# Patient Record
Sex: Male | Born: 1940
Health system: Southern US, Community
[De-identification: ages and names within clinical notes are randomized; demographics above are authoritative.]

## PROBLEM LIST (undated history)

## (undated) DIAGNOSIS — I10 Essential (primary) hypertension: Secondary | ICD-10-CM

## (undated) DIAGNOSIS — G4733 Obstructive sleep apnea (adult) (pediatric): Secondary | ICD-10-CM

## (undated) DIAGNOSIS — I714 Abdominal aortic aneurysm, without rupture, unspecified: Secondary | ICD-10-CM

## (undated) DIAGNOSIS — Z87442 Personal history of urinary calculi: Secondary | ICD-10-CM

## (undated) DIAGNOSIS — H9319 Tinnitus, unspecified ear: Secondary | ICD-10-CM

## (undated) DIAGNOSIS — K449 Diaphragmatic hernia without obstruction or gangrene: Secondary | ICD-10-CM

## (undated) DIAGNOSIS — N200 Calculus of kidney: Secondary | ICD-10-CM

## (undated) DIAGNOSIS — E785 Hyperlipidemia, unspecified: Secondary | ICD-10-CM

## (undated) DIAGNOSIS — K635 Polyp of colon: Secondary | ICD-10-CM

## (undated) DIAGNOSIS — K219 Gastro-esophageal reflux disease without esophagitis: Secondary | ICD-10-CM

## (undated) DIAGNOSIS — I2510 Atherosclerotic heart disease of native coronary artery without angina pectoris: Secondary | ICD-10-CM

## (undated) DIAGNOSIS — M1990 Unspecified osteoarthritis, unspecified site: Secondary | ICD-10-CM

## (undated) DIAGNOSIS — N400 Enlarged prostate without lower urinary tract symptoms: Secondary | ICD-10-CM

## (undated) DIAGNOSIS — R011 Cardiac murmur, unspecified: Secondary | ICD-10-CM

## (undated) DIAGNOSIS — IMO0002 Reserved for concepts with insufficient information to code with codable children: Secondary | ICD-10-CM

## (undated) HISTORY — DX: Atherosclerotic heart disease of native coronary artery without angina pectoris: I25.10

## (undated) HISTORY — DX: Reserved for concepts with insufficient information to code with codable children: IMO0002

## (undated) HISTORY — DX: Diaphragmatic hernia without obstruction or gangrene: K44.9

## (undated) HISTORY — DX: Hyperlipidemia, unspecified: E78.5

## (undated) HISTORY — DX: Polyp of colon: K63.5

## (undated) HISTORY — PX: UPPER GI ENDOSCOPY: SHX6162

## (undated) HISTORY — DX: Abdominal aortic aneurysm, without rupture, unspecified: I71.40

## (undated) HISTORY — PX: TONSILLECTOMY: SUR1361

## (undated) HISTORY — DX: Calculus of kidney: N20.0

## (undated) HISTORY — PX: COLONOSCOPY: SHX174

## (undated) HISTORY — DX: Essential (primary) hypertension: I10

## (undated) HISTORY — DX: Abdominal aortic aneurysm, without rupture: I71.4

---

## 1997-11-09 ENCOUNTER — Ambulatory Visit (HOSPITAL_COMMUNITY): Admission: RE | Admit: 1997-11-09 | Discharge: 1997-11-09 | Payer: Self-pay | Admitting: *Deleted

## 2000-12-17 ENCOUNTER — Ambulatory Visit (HOSPITAL_COMMUNITY): Admission: RE | Admit: 2000-12-17 | Discharge: 2000-12-17 | Payer: Self-pay | Admitting: Gastroenterology

## 2000-12-17 ENCOUNTER — Encounter (INDEPENDENT_AMBULATORY_CARE_PROVIDER_SITE_OTHER): Payer: Self-pay | Admitting: *Deleted

## 2001-03-04 HISTORY — PX: OTHER SURGICAL HISTORY: SHX169

## 2003-02-16 ENCOUNTER — Ambulatory Visit (HOSPITAL_COMMUNITY): Admission: RE | Admit: 2003-02-16 | Discharge: 2003-02-16 | Payer: Self-pay | Admitting: Family Medicine

## 2004-03-04 HISTORY — PX: CORONARY ARTERY BYPASS GRAFT: SHX141

## 2004-07-05 ENCOUNTER — Ambulatory Visit: Payer: Self-pay | Admitting: Cardiology

## 2004-07-06 ENCOUNTER — Inpatient Hospital Stay (HOSPITAL_BASED_OUTPATIENT_CLINIC_OR_DEPARTMENT_OTHER): Admission: RE | Admit: 2004-07-06 | Discharge: 2004-07-06 | Payer: Self-pay | Admitting: *Deleted

## 2004-07-06 ENCOUNTER — Inpatient Hospital Stay (HOSPITAL_COMMUNITY): Admission: AD | Admit: 2004-07-06 | Discharge: 2004-07-13 | Payer: Self-pay | Admitting: *Deleted

## 2004-07-06 ENCOUNTER — Ambulatory Visit: Payer: Self-pay | Admitting: *Deleted

## 2004-08-02 ENCOUNTER — Ambulatory Visit: Payer: Self-pay | Admitting: Cardiology

## 2004-09-27 ENCOUNTER — Ambulatory Visit: Payer: Self-pay | Admitting: Cardiology

## 2005-04-10 ENCOUNTER — Ambulatory Visit: Payer: Self-pay | Admitting: Cardiology

## 2005-12-23 ENCOUNTER — Encounter: Admission: RE | Admit: 2005-12-23 | Discharge: 2005-12-23 | Payer: Self-pay | Admitting: Orthopedic Surgery

## 2006-03-04 HISTORY — PX: OTHER SURGICAL HISTORY: SHX169

## 2006-05-01 ENCOUNTER — Ambulatory Visit: Payer: Self-pay | Admitting: Cardiology

## 2006-05-07 ENCOUNTER — Ambulatory Visit (HOSPITAL_BASED_OUTPATIENT_CLINIC_OR_DEPARTMENT_OTHER): Admission: RE | Admit: 2006-05-07 | Discharge: 2006-05-07 | Payer: Self-pay | Admitting: Orthopedic Surgery

## 2007-04-22 ENCOUNTER — Ambulatory Visit: Payer: Self-pay | Admitting: Cardiology

## 2008-03-31 ENCOUNTER — Encounter: Admission: RE | Admit: 2008-03-31 | Discharge: 2008-03-31 | Payer: Self-pay | Admitting: Gastroenterology

## 2008-04-06 ENCOUNTER — Ambulatory Visit: Payer: Self-pay | Admitting: Cardiology

## 2008-12-15 ENCOUNTER — Encounter: Payer: Self-pay | Admitting: Cardiology

## 2009-01-18 ENCOUNTER — Encounter (INDEPENDENT_AMBULATORY_CARE_PROVIDER_SITE_OTHER): Payer: Self-pay | Admitting: *Deleted

## 2009-04-14 DIAGNOSIS — I10 Essential (primary) hypertension: Secondary | ICD-10-CM | POA: Insufficient documentation

## 2009-04-14 DIAGNOSIS — E785 Hyperlipidemia, unspecified: Secondary | ICD-10-CM | POA: Insufficient documentation

## 2009-04-14 DIAGNOSIS — I2510 Atherosclerotic heart disease of native coronary artery without angina pectoris: Secondary | ICD-10-CM

## 2009-04-14 DIAGNOSIS — K449 Diaphragmatic hernia without obstruction or gangrene: Secondary | ICD-10-CM | POA: Insufficient documentation

## 2009-04-14 DIAGNOSIS — D126 Benign neoplasm of colon, unspecified: Secondary | ICD-10-CM

## 2009-04-14 DIAGNOSIS — IMO0002 Reserved for concepts with insufficient information to code with codable children: Secondary | ICD-10-CM

## 2009-04-19 ENCOUNTER — Encounter: Payer: Self-pay | Admitting: Cardiology

## 2009-04-19 ENCOUNTER — Ambulatory Visit: Payer: Self-pay | Admitting: Cardiology

## 2009-07-11 ENCOUNTER — Ambulatory Visit: Payer: Self-pay | Admitting: Cardiology

## 2009-07-11 ENCOUNTER — Ambulatory Visit: Payer: Self-pay

## 2009-08-22 ENCOUNTER — Encounter: Payer: Self-pay | Admitting: Cardiology

## 2009-08-30 ENCOUNTER — Encounter: Payer: Self-pay | Admitting: Cardiology

## 2009-09-01 ENCOUNTER — Telehealth: Payer: Self-pay | Admitting: Cardiology

## 2009-09-01 DIAGNOSIS — I493 Ventricular premature depolarization: Secondary | ICD-10-CM | POA: Insufficient documentation

## 2009-09-13 ENCOUNTER — Encounter: Payer: Self-pay | Admitting: Cardiology

## 2009-09-14 ENCOUNTER — Ambulatory Visit (HOSPITAL_COMMUNITY): Admission: RE | Admit: 2009-09-14 | Discharge: 2009-09-14 | Payer: Self-pay | Admitting: Cardiology

## 2009-09-14 ENCOUNTER — Ambulatory Visit: Payer: Self-pay

## 2009-09-14 ENCOUNTER — Encounter: Payer: Self-pay | Admitting: Cardiology

## 2009-09-14 ENCOUNTER — Telehealth (INDEPENDENT_AMBULATORY_CARE_PROVIDER_SITE_OTHER): Payer: Self-pay | Admitting: *Deleted

## 2009-09-14 ENCOUNTER — Ambulatory Visit: Payer: Self-pay | Admitting: Cardiology

## 2009-09-27 ENCOUNTER — Encounter: Payer: Self-pay | Admitting: Cardiology

## 2009-10-11 ENCOUNTER — Ambulatory Visit: Payer: Self-pay | Admitting: Cardiology

## 2009-10-17 ENCOUNTER — Encounter: Payer: Self-pay | Admitting: Cardiology

## 2010-03-27 ENCOUNTER — Telehealth (INDEPENDENT_AMBULATORY_CARE_PROVIDER_SITE_OTHER): Payer: Self-pay | Admitting: *Deleted

## 2010-04-03 NOTE — Assessment & Plan Note (Signed)
Summary: [name] [name]  Medications Added NIASPAN 1000 MG CR-TABS (NIACIN (ANTIHYPERLIPIDEMIC)) 1 by mouth daily ASPIRIN 81 MG  TABS (ASPIRIN) 1-2 by mouth daily LIPITOR 20 MG TABS (ATORVASTATIN CALCIUM) 1 by mouth daily MULTIVITAMINS   TABS (MULTIPLE VITAMIN) 1 by mouth daily COQ10 100 MG CAPS (COENZYME Q10) 1 by mouth daily VITAMIN A & D 5000-400 UNIT CAPS (VITAMINS A & D) 1 by mouth daily * OSTEOBIFLEX 1 by mouth daily LOVAZA 1 GM CAPS (OMEGA-3-ACID ETHYL ESTERS) 3 daily      Allergies Added: ! LEVAQUIN  Visit Type:  Follow-up Primary Provider:  Dr. Vernon Prey  CC:  CAD.  History of Present Illness: The patient returns for yearly followup.  Since I last saw him he has done well. He denies any chest pressure, neck or arm discomfort. He has had no palpitations, presyncope or syncope. He denies any shortness of breath, PND or orthopnea. He does walk for exercise and also walks for golfing routinely without bringing on any of his previous symptoms.  Current Medications (verified): 1)  Metoprolol Tartrate 25 Mg Tabs (Metoprolol Tartrate) .Marland Kitchen.. 1 By Mouth Two Times A Day 2)  Niaspan 1000 Mg Cr-Tabs (Niacin (Antihyperlipidemic)) .Marland Kitchen.. 1 By Mouth Daily 3)  Aspirin 81 Mg  Tabs (Aspirin) .Marland Kitchen.. 1-2 By Mouth Daily 4)  Lipitor 20 Mg Tabs (Atorvastatin Calcium) .Marland Kitchen.. 1 By Mouth Daily 5)  Multivitamins   Tabs (Multiple Vitamin) .Marland Kitchen.. 1 By Mouth Daily 6)  Coq10 100 Mg Caps (Coenzyme Q10) .Marland Kitchen.. 1 By Mouth Daily 7)  Vitamin A & D 5000-400 Unit Caps (Vitamins A & D) .Marland Kitchen.. 1 By Mouth Daily 8)  Osteobiflex .Marland KitchenMarland Kitchen. 1 By Mouth Daily  Allergies (verified): 1)  ! Levaquin  Past History:  Past Medical History: Reviewed history from 04/14/2009 and no changes required. 1. Coronary artery disease status post CABG in 2006 (LIMA to the LAD,       SVG to the right coronary artery, SVG to circumflex, and SVG to       diagonal).   2. Dyslipidemia.   3. Colonic polyps.   4. Hiatal hernia.   5. Herniated  disk.   6. Hypertension.   7. Mole removed, 2003.   8. Right knee surgery, 2008.   9. Tonsillectomy.   Past Surgical History: Had a mole removed in 2003 and had a tonsillectomy as a child. CABG  Review of Systems       As stated in the HPI and negative for all other systems.   Vital Signs:  Patient profile:   70 year old male Height:      73 inches Weight:      205 pounds BMI:     27.14 Pulse rate:   57 / minute Resp:     16 per minute BP sitting:   144 / 72  (right arm)  Vitals Entered By: Marrion Coy, CNA (April 19, 2009 11:25 AM)  Physical Exam  General:  Well developed, well nourished, in no acute distress. Head:  normocephalic and atraumatic Eyes:  PERRLA/EOM intact; conjunctiva and lids normal. Mouth:  Teeth, gums and palate normal. Oral mucosa normal. Neck:  Neck supple, no JVD. No masses, thyromegaly or abnormal cervical nodes. Chest Wall:  well-healed sternotomy scar Lungs:  Clear bilaterally to auscultation and percussion. Heart:  Non-displaced PMI, chest non-tender; regular rate and rhythm, S1, S2 without murmurs, rubs or gallops. Carotid upstroke normal, no bruit. Normal abdominal aortic size, no bruits. Femorals normal pulses, no bruits. Pedals normal  pulses. No edema, no varicosities. Abdomen:  Bowel sounds positive; abdomen soft and non-tender without masses, organomegaly, or hernias noted. No hepatosplenomegaly. Msk:  Back normal, normal gait. Muscle strength and tone normal. Extremities:  No clubbing or cyanosis. Neurologic:  Alert and oriented x 3. Skin:  Intact without lesions or rashes. Cervical Nodes:  no significant adenopathy Axillary Nodes:  no significant adenopathy Inguinal Nodes:  no significant adenopathy Psych:  Normal affect.   EKG  Procedure date:  04/19/2009  Findings:      sinus rhythm, rate 57, axis within normal limits, intervals within normal limits, no acute ST-T wave changes.  Impression & Recommendations:  Problem #  1:  CAD (ICD-414.00) He is doing well. However, he is now 5 years from the time of his bypass. I will bring him back for an exercise treadmill test to followup on this. He will otherwise continue with risk reduction. Orders: Treadmill (Treadmill) EKG w/ Interpretation (93000)  Problem # 2:  HYPERTENSION (ICD-401.9) His blood pressure is slightly elevated but this is unusual. He keeps an eye on this at home. No change in therapy is indicated.  Problem # 3:  DYSLIPIDEMIA (ICD-272.4) I did review the lipids drawn by Dr. Christell Constant. His LDL was 81 with an HDL of 52. However, to further improve his particle size it was suggested that he try a higher dose of Niaspan. He could not tolerate 1500 mg. He developed some muscle aches and weakness. He is back down to 1000 mg. I did encourage him to try one more time the higher dose and if he gets symptoms with this to further discuss it with Dr. Christell Constant.  Patient Instructions: 1)  Your physician recommends that you schedule a follow-up appointment in: 12 months with Dr Antoine Poche [place] 2)  Your physician recommends that you continue on your current medications as directed. Please refer to the Current Medication list given to you today. 3)  Your physician has requested that you have an exercise tolerance test.  For further information please visit [email or web address]  Please also follow instruction sheet, as given.  Appt for 05/23/2009 at 3 pm in the [place] office on the 3rd floor.  Nothing to eat/drink and no tobacco products 3 hours beore the test.  Please call [phone] to reschedule if needed.

## 2010-04-03 NOTE — Progress Notes (Signed)
Summary: order for 2 d echo and f/u with [facility]   ---- Converted from flag ---- ---- 09/01/2009 10:23 AM, Rollene Rotunda, MD, [facility] wrote: Can you schedule an echo for this patient.  He is having frequent PVCs.  He then needs follow up with me.  Thanks. ------------------------------     New Problems: PREMATURE VENTRICULAR CONTRACTIONS (ICD-427.69)   New Problems: PREMATURE VENTRICULAR CONTRACTIONS (ICD-427.69)

## 2010-04-03 NOTE — Miscellaneous (Signed)
  Clinical Lists Changes  Observations: Added new observation of ECHOINTERP:  - Left ventricle: The cavity size was normal. Wall thickness was       increased in a pattern of mild LVH. Systolic function was normal.       The estimated ejection fraction was in the range of 55% to 60%.       Wall motion was normal; there were no regional wall motion       abnormalities. Features are consistent with a pseudonormal left       ventricular filling pattern, with concomitant abnormal relaxation       and increased filling pressure (grade 2 diastolic dysfunction).     - Aortic valve: Mild regurgitation.     - Mitral valve: Mild regurgitation.     - Left atrium: The atrium was mildly dilated.     - Right atrium: The atrium was mildly dilated.     - Pulmonary arteries: Systolic pressure was mildly increased. (09/14/2009 10:48) Added new observation of ETTFINDING: Quality of ETT:   diagnostic    ETT Interpretation:   normal-no evidence of ischemia by ST analysis    Comments:     The patient had an excellent exercise tolerance.  He had no chest pain and appropriate SOB.  He had an appropriate BP response.  There were PVCs.  He had no ischemic ST T wave changes.      Recommendations:   Negative adequate ETT.  No evidence of new clinically significant CAD.  No further testing is suggested.  He can continue with secondary risk reduction.  (07/11/2009 10:49)      Echocardiogram  Procedure date:  09/14/2009  Findings:       - Left ventricle: The cavity size was normal. Wall thickness was       increased in a pattern of mild LVH. Systolic function was normal.       The estimated ejection fraction was in the range of 55% to 60%.       Wall motion was normal; there were no regional wall motion       abnormalities. Features are consistent with a pseudonormal left       ventricular filling pattern, with concomitant abnormal relaxation       and increased filling pressure (grade 2 diastolic  dysfunction).     - Aortic valve: Mild regurgitation.     - Mitral valve: Mild regurgitation.     - Left atrium: The atrium was mildly dilated.     - Right atrium: The atrium was mildly dilated.     - Pulmonary arteries: Systolic pressure was mildly increased.  Exercise Stress Test  Procedure date:  07/11/2009  Findings:      Quality of ETT:   diagnostic    ETT Interpretation:   normal-no evidence of ischemia by ST analysis    Comments:     The patient had an excellent exercise tolerance.  He had no chest pain and appropriate SOB.  He had an appropriate BP response.  There were PVCs.  He had no ischemic ST T wave changes.      Recommendations:   Negative adequate ETT.  No evidence of new clinically significant CAD.  No further testing is suggested.  He can continue with secondary risk reduction.

## 2010-04-03 NOTE — Letter (Signed)
Summary: [employer] Authorization Notification   [employer] Authorization Notification   Imported By: Roderic Ovens 11/02/2009 14:15:46  _____________________________________________________________________  External Attachment:    Type:   Image     Comment:   External Document

## 2010-04-03 NOTE — Assessment & Plan Note (Signed)
Summary: [name] [name]  Medications Added LOVAZA 1 GM CAPS (OMEGA-3-ACID ETHYL ESTERS) 2  daily MULTIVITAMINS   TABS (MULTIPLE VITAMIN) 1 by mouth daily      Allergies Added:   Visit Type:  Follow-up Primary Provider:  Dr. Vernon Prey  CC:  CAD.  History of Present Illness: The patient presents for followup of some recent palpitations. He did have some PVCs. An echocardiogram done to evaluate this demonstrated no significant valvular abnormalities and normal left ventricular function. Since that time he stopped drinking decaffeinated coffee or tea and his symptoms seemed to have resolved. He remains active walking and exercising routinely. With this he denies any chest pressure, neck or arm discomfort. He isn't noticing any palpitations and has had no presyncope or syncope. He denies any shortness of breath, PND or orthopnea. He has had no lower extremity edema.  Current Medications (verified): 1)  Metoprolol Tartrate 25 Mg Tabs (Metoprolol Tartrate) .Marland Kitchen.. 1 By Mouth Two Times A Day 2)  Niaspan 1000 Mg Cr-Tabs (Niacin (Antihyperlipidemic)) .Marland Kitchen.. 1 By Mouth Daily 3)  Aspirin 81 Mg  Tabs (Aspirin) .Marland Kitchen.. 1-2 By Mouth Daily 4)  Lipitor 20 Mg Tabs (Atorvastatin Calcium) .Marland Kitchen.. 1 By Mouth Daily 5)  Multivitamins   Tabs (Multiple Vitamin) .Marland Kitchen.. 1 By Mouth Daily 6)  Coq10 100 Mg Caps (Coenzyme Q10) .Marland Kitchen.. 1 By Mouth Daily 7)  Vitamin A & D 5000-400 Unit Caps (Vitamins A & D) .Marland Kitchen.. 1 By Mouth Daily 8)  Lovaza 1 Gm Caps (Omega-3-Acid Ethyl Esters) .... 2  Daily 9)  Multivitamins   Tabs (Multiple Vitamin) .Marland Kitchen.. 1 By Mouth Daily  Allergies (verified): 1)  ! Levaquin  Past History:  Past Medical History: Reviewed history from 04/14/2009 and no changes required. 1. Coronary artery disease status post CABG in 2006 (LIMA to the LAD,       SVG to the right coronary artery, SVG to circumflex, and SVG to       diagonal).   2. Dyslipidemia.   3. Colonic polyps.   4. Hiatal hernia.   5. Herniated disk.    6. Hypertension.   7. Mole removed, 2003.   8. Right knee surgery, 2008.   9. Tonsillectomy.   Past Surgical History: Mole removed in 2003  Tonsillectomy  CABG  Review of Systems       As stated in the HPI and negative for all other systems.   Vital Signs:  Patient profile:   70 year old male Height:      73 inches Weight:      200 pounds BMI:     26.48 Pulse rate:   66 / minute Resp:     18 per minute BP sitting:   126 / 84  (right arm)  Vitals Entered By: [name] Coy, CNA (October 11, 2009 3:21 PM)  Physical Exam  General:  Well developed, well nourished, in no acute distress. Head:  normocephalic and atraumatic Neck:  Neck supple, no JVD. No masses, thyromegaly or abnormal cervical nodes. Chest Wall:  well-healed sternotomy scar Lungs:  Clear bilaterally to auscultation and percussion. Heart:  Non-displaced PMI, chest non-tender; regular rate and rhythm, S1, S2 without murmurs, rubs or gallops. Carotid upstroke normal, no bruit. Normal abdominal aortic size, no bruits. Femorals normal pulses, no bruits. Pedals normal pulses. No edema, no varicosities. Abdomen:  Bowel sounds positive; abdomen soft and non-tender without masses, organomegaly, or hernias noted. No hepatosplenomegaly. Msk:  Back normal, normal gait. Muscle strength and tone normal. Extremities:  No clubbing or cyanosis. Neurologic:  Alert and oriented x 3. Skin:  Intact without lesions or rashes. Cervical Nodes:  no significant adenopathy Axillary Nodes:  no significant adenopathy Inguinal Nodes:  no significant adenopathy Psych:  Normal affect.   Impression & Recommendations:  Problem # 1:  PREMATURE VENTRICULAR CONTRACTIONS (ICD-427.69) The patient is no longer experiencing these. At this point no further cardiovascular testing is suggested. He will continue with the meds as listed.  Problem # 2:  CAD (ICD-414.00) He has no new symptoms. He is anticipating aggressively and risk reduction. No  further cardiovascular testing is suggested.  Problem # 3:  HYPERTENSION (ICD-401.9) The patient's blood pressure is well preserved. He will continue the meds as listed.  Problem # 4:  DYSLIPIDEMIA (ICD-272.4) I reviewed his recent lipid profile and is excellent.  Patient Instructions: 1)  Your physician recommends that you schedule a follow-up appointment in: 6 months with Dr  Lions 2)  Your physician recommends that you continue on your current medications as directed. Please refer to the Current Medication list given to you today.

## 2010-04-03 NOTE — Progress Notes (Signed)
   Walk in Patient Form Recieved " Pt Dropped off EKG from Dr.Moore's "Sent to Message Nurse [facility]  September 14, 2009 4:34 PM

## 2010-04-05 NOTE — Progress Notes (Signed)
Summary: Records Request   Faxed OV & EKG to [facility] [name] at [facility] ([phone]). Debby Freiberg  March 27, 2010 5:06 PM

## 2010-05-01 ENCOUNTER — Other Ambulatory Visit: Payer: Self-pay | Admitting: Gastroenterology

## 2010-05-02 ENCOUNTER — Ambulatory Visit (INDEPENDENT_AMBULATORY_CARE_PROVIDER_SITE_OTHER): Payer: Medicare Other | Admitting: Cardiology

## 2010-05-02 ENCOUNTER — Encounter: Payer: Self-pay | Admitting: Cardiology

## 2010-05-02 DIAGNOSIS — I2510 Atherosclerotic heart disease of native coronary artery without angina pectoris: Secondary | ICD-10-CM

## 2010-05-10 NOTE — Assessment & Plan Note (Signed)
Summary: 70yr rov 414.01  Medications Added COQ10 100 MG CAPS (COENZYME Q10) 1 by mouth two times a day FISH OIL   OIL (FISH OIL) 2 by mouth daily      Allergies Added:   Visit Type:  Follow-up Primary Provider:  Dr. Vernon Prey  CC:  CAD.  History of Present Illness: The patient presents for followup. Since I last saw him he has had no new cardiovascular complaints. He has been exercising routinely. With this level of activity he denied any chest pressure, neck or arm discomfort. He has no palpitations, presyncope or syncope. No weight gain or edema. We did an echocardiogram and stress test in 2011 both of which were normal.  Current Medications (verified): 1)  Metoprolol Tartrate 25 Mg Tabs (Metoprolol Tartrate) .Marland Kitchen.. 1 By Mouth Two Times A Day 2)  Niaspan 1000 Mg Cr-Tabs (Niacin (Antihyperlipidemic)) .Marland Kitchen.. 1 By Mouth Daily 3)  Aspirin 81 Mg  Tabs (Aspirin) .Marland Kitchen.. 1-2 By Mouth Daily 4)  Lipitor 20 Mg Tabs (Atorvastatin Calcium) .Marland Kitchen.. 1 By Mouth Daily 5)  Multivitamins   Tabs (Multiple Vitamin) .Marland Kitchen.. 1 By Mouth Daily 6)  Coq10 100 Mg Caps (Coenzyme Q10) .Marland Kitchen.. 1 By Mouth Two Times A Day 7)  Vitamin A & D 5000-400 Unit Caps (Vitamins A & D) .Marland Kitchen.. 1 By Mouth Daily 8)  Fish Oil   Oil (Fish Oil) .... 2 By Mouth Daily 9)  Multivitamins   Tabs (Multiple Vitamin) .Marland Kitchen.. 1 By Mouth Daily  Allergies (verified): 1)  ! Levaquin  Past History:  Past Medical History: Reviewed history from 04/14/2009 and no changes required. 1. Coronary artery disease status post CABG in 2006 (LIMA to the LAD,       SVG to the right coronary artery, SVG to circumflex, and SVG to       diagonal).   2. Dyslipidemia.   3. Colonic polyps.   4. Hiatal hernia.   5. Herniated disk.   6. Hypertension.   7. Mole removed, 2003.   8. Right knee surgery, 2008.   9. Tonsillectomy.   Past Surgical History: Reviewed history from 10/11/2009 and no changes required. Mole removed in 2003  Tonsillectomy  CABG  Review of  Systems       As stated in the HPI and negative for all other systems.   Vital Signs:  Patient profile:   70 year old male Height:      73 inches Weight:      206 pounds BMI:     27.28 Pulse rate:   65 / minute Resp:     16 per minute BP sitting:   112 / 78  (right arm)  Vitals Entered By: Marrion Coy, CNA (May 02, 2010 9:33 AM)  Physical Exam  General:  Well developed, well nourished, in no acute distress. Head:  normocephalic and atraumatic Neck:  Neck supple, no JVD. No masses, thyromegaly or abnormal cervical nodes. Chest Wall:  well-healed sternotomy scar Lungs:  Clear bilaterally to auscultation and percussion. Heart:  Non-displaced PMI, chest non-tender; regular rate and rhythm, S1, S2 without murmurs, rubs or gallops. Carotid upstroke normal, no bruit. Normal abdominal aortic size, no bruits. Femorals normal pulses, no bruits. Pedals normal pulses. No edema, no varicosities. Abdomen:  Bowel sounds positive; abdomen soft and non-tender without masses, organomegaly, or hernias noted. No hepatosplenomegaly. Genitalia:  normal male, testes descended bilaterally without masses, no hernias noted Msk:  Back normal, normal gait. Muscle strength and tone normal. Extremities:  No clubbing  or cyanosis. Neurologic:  Alert and oriented x 3. Skin:  Intact without lesions or rashes. Cervical Nodes:  no significant adenopathy Inguinal Nodes:  no significant adenopathy Psych:  Normal affect.   EKG  Procedure date:  05/02/2010  Findings:      Sinus rhythm rate 64, axis within normal limits, intervals within normal limits, no acute ST-T wave changes  Impression & Recommendations:  Problem # 1:  CAD (ICD-414.00) The patient has had no new symptoms. No further cardiac testing is suggested. He will continue with risk reduction. Orders: EKG w/ Interpretation (93000)  Problem # 2:  HYPERTENSION (ICD-401.9) His blood pressure is well controlled. Will continue on meds as  listed.  Problem # 3:  DYSLIPIDEMIA (ICD-272.4) I reviewed the profile of January. His LDL was 70, HDL 59 triglycerides 52. This is well within target. He will continue meds as listed.  Problem # 4:  PREMATURE VENTRICULAR CONTRACTIONS (ICD-427.69) He is having symptomatic palpitations. No change in therapy is indicated. Orders: EKG w/ Interpretation (93000)  Patient Instructions: 1)  Your physician recommends that you schedule a follow-up appointment in: 1 yr with Dr Antoine Poche 2)  Your physician recommends that you continue on your current medications as directed. Please refer to the Current Medication list given to you today. Prescriptions: METOPROLOL TARTRATE 25 MG TABS (METOPROLOL TARTRATE) 1 by mouth two times a day  #60 x 11   Entered by:   Charolotte Capuchin, RN   Authorized by:   Rollene Rotunda, MD, [facility]   Signed by:   Charolotte Capuchin, RN on 05/02/2010   Method used:   Faxed to ...       [facility]       [address]       [place], [place]  [address]       Ph: [phone] or [phone]       Fax: [phone]   RxID:   [id number]

## 2010-07-17 NOTE — Assessment & Plan Note (Signed)
[facility]                            CARDIOLOGY OFFICE NOTE   [name] [name], [name] [name]                         MRN:          [id number]  DATE:04/06/2008                            DOB:          [birth year]    PRIMARY CARE PHYSICIAN:  Ernestina Penna, MD   REASON FOR PRESENTATION:  Evaluate the patient with coronary disease.   HISTORY OF PRESENT ILLNESS:  The patient returns for followup.  He is  now 70 years old.  He has done quite well since I last saw him.  He  exercises every day.  He does walking.  He works at [employer]  He walks  [place].  With all of these, he denies any chest pressure, neck  or arm discomfort.  He has had no shortness of breath, PND, or  orthopnea.  He denies any palpitations, presyncope, or syncope.  He has  had an excellent lipid profile.  His blood pressure have been extremely  well controlled.  He has lost about 20 pounds since I last saw him!   PAST MEDICAL HISTORY:  1. Coronary artery disease status post CABG in 2006 (LIMA to the LAD,      SVG to the right coronary artery, SVG to circumflex, and SVG to      diagonal).  2. Dyslipidemia.  3. Colonic polyps.  4. Hiatal hernia.  5. Herniated disk.  6. Hypertension.  7. Mole removed, 2003.  8. Right knee surgery, 2008.  9. Tonsillectomy.   ALLERGIES:  None.   MEDICATIONS:  1. Nexium 40 mg every other day.  2. Multivitamin.  3. Vitamin B complex.  4. Coenzyme Q.  5. Aspirin 81 mg daily.  6. Metoprolol 25 mg b.i.d.  7. Fish oil.  8. Flax seed.  9. Niaspan 1000 mg daily.  10.Lipitor 20 mg daily.  11.Osteo Bi-Flex.  12.Vitamin D.   REVIEW OF SYSTEMS:  As stated in the HPI and otherwise negative for  other systems.   PHYSICAL EXAMINATION:  GENERAL:  The patient is pleasant and in no  distress.  VITAL SIGNS:  Blood pressure 126/86 and heart rate 69 and regular.  HEENT:  Eyes unremarkable, pupils equal, round, and reactive to light,  fundi not visualized,  oral mucosa unremarkable.  NECK:  No jugular venous distention at 45 degrees, carotid upstroke  brisk and symmetric, no bruits, no thyromegaly.  LYMPHATICS:  No adenopathy.  LUNGS:  Clear to auscultation bilaterally.  BACK:  No costovertebral angle tenderness.  CHEST:  Unremarkable.  HEART:  PMI not displaced or sustained, S1 and S2 within normal limits,  no S3, no S4, no clicks, no rubs, no murmurs.  ABDOMEN:  Flat, positive bowel sounds, normal in frequency and pitch, no  bruits, no rebound, no guarding, no midline pulsatile mass, no  hepatomegaly, no splenomegaly.  SKIN:  No rashes, no nodules.  EXTREMITIES:  Pulses 2+, no edema.   EKG; sinus rhythm, rate 69, axis within normal limits, intervals within  normal limits, no acute ST wave changes.   ASSESSMENT AND PLAN:  1. Coronary disease.  The patient is having no new symptoms.  He is      participating very aggressively in secondary risk reduction.  No      further cardiovascular testing is suggested at this point.  I      probably will perform a stress test at the 5-year anniversary of      his bypass which will be next year.  2. Dyslipidemia.  Excellent lipid profile and I reviewed this.  He      will remain on the meds as listed.  3. Followup.  I will see the patient again in 12 months or sooner if      needed.     Rollene Rotunda, MD, [facility]  Electronically Signed    JH/MedQ  DD: 04/06/2008  DT: 04/07/2008  Job #: [id number]   cc:   Ernestina Penna, M.D.

## 2010-07-17 NOTE — Assessment & Plan Note (Signed)
[facility]                            CARDIOLOGY OFFICE NOTE   [name] [name], [name] [name]                         MRN:          [id number]  DATE:04/22/2007                            DOB:          [birth year]    PRIMARY CARE PHYSICIAN:  Ernestina Penna, M.D.   REASON FOR PRESENTATION:  Evaluate the patient with coronary disease.   HISTORY OF PRESENT ILLNESS:  The patient is 70 year old white gentleman  with a history of coronary disease described below.  He has done well in  the past year.  He has had no cardiovascular complaints.  He exercises  routinely.  He denies any chest pressure, neck or arm discomfort.  He is  had no palpitation, presyncope or syncope.  No PND or orthopnea.  Since  I last saw him, he has had his right knee replaced, though he still has  some discomfort there.   PAST MEDICAL HISTORY:  1. Coronary artery disease status post CABG in 2006 (LIMA to the LAD,      SVG to right coronary artery, SVG to circumflex, SVG to diagonal).  2. Dyslipidemia.  3. Colonic polyps.  4. Hiatal hernia.  5. Herniated disk.  6. Hypertension.  7. Mole removed 2003.  8. Right knee surgery 2008.  9. Tonsillectomy.   ALLERGIES:  None.   MEDICATIONS:  1. Multivitamin.  2. B complex.  3. Coenzyme Q.  4. Aspirin 81 mg daily.  5. Toprol 25 mg b.i.d.  6. Fish oil.  7. Flaxseed oil.  8. Niaspan 500 mg daily.  9. Lipitor 40 mg daily.   REVIEW OF SYSTEMS:  As stated in the HPI, otherwise, negative for other  systems.   PHYSICAL EXAMINATION:  GENERAL:  The patient is in no distress.  VITAL SIGNS:  Blood pressure 151/86, heart rate 57 and regular, weight  223 pounds, body mass index 29.  HEENT:  Eyes unremarkable, pupils equal and reactive.  Fundi not  visualized.  NECK:  No jugular distention at 45 degrees, carotid upstroke brisk and  symmetric, no bruits, no thyromegaly.  LYMPHATICS.  No adenopathy.  LUNGS:  Clear to auscultation bilaterally.  CHEST:  Well-healed sternotomy scar.  HEART:  PMI not displaced or sustained, S1-S2 within normal.  No S3-S4,  no clicks, rubs, murmurs.  ABDOMEN:  Flat, positive bowel sounds normal frequency, pitch, no  bruits, rebound, guarding or midline pulsatile mass, organomegaly.  SKIN:  No rash.  EXTREMITIES:  Pulses 2+, no cyanosis or clubbing, edema.  NEUROLOGICAL:  Grossly intact.   STUDIES:  EKG sinus rhythm, rate 57, axis within normal limits,  intervals within normal limits, no acute ST-T wave changes.   ASSESSMENT/PLAN:  1. Coronary disease.  The patient having no symptoms related to this.      No further cardiovascular testing is suggested.  2. Weight.  We did discuss the fact that his weight is going up.  He      is approaching a BMI that will put him in the obese range.      Hopefully, he can comply with losing this.  3. Dyslipidemia per Dr. Christell Constant.  This is being followed closely with      lipo med profiles.  4. Hypertension.  Blood pressure is slightly elevated today, but it is      not when he takes it otherwise.  It has not been at Dr. Kathi Der.      Therefore, I will make no change to his medications.  Encourage      weight loss.  5. Follow-up.  Will see him back in one year or sooner if needed.     Rollene Rotunda, MD, [facility]  Electronically Signed    JH/MedQ  DD: 04/22/2007  DT: 04/23/2007  Job #: [id number]   cc:   Ernestina Penna, M.D.

## 2010-07-20 NOTE — Discharge Summary (Signed)
NAMEJERMEY, CLOSS NO.:  [id number]   MEDICAL RECORD NO.:  [id number]          PATIENT TYPE:  INP   LOCATION:  2029                         FACILITY:  [facility]   PHYSICIAN:  Kerin Perna, M.D.  DATE OF BIRTH:  Jan 14, 1941   DATE OF ADMISSION:  07/06/2004  DATE OF DISCHARGE:  07/12/2004                                 DISCHARGE SUMMARY   ADMISSION DIAGNOSIS:  Coronary artery disease.   DISCHARGE DIAGNOSIS:  1.  Hyperlipidemia.  2.  Hypertension.  3.  Colonic polyps status post resection.  4.  Hiatal hernia.  5.  Herniated lumbar disc.  6.  Mole removal in 2003.  7.  Tonsillectomy as a child.  8.  Class IV unstable angina with severe two vessel coronary artery disease      status post coronary artery bypass grafting x 4.  9.  Right groin pseudoaneurysm after cardiac catheterization stable after      ultrasound guided compression.   ALLERGIES:  No known drug allergies.   BRIEF HISTORY:  The patient is a 70 year old male who has been followed by  Dr. Antoine Poche as an outpatient for quite sometime secondary to a history of  chest discomfort.  The patient's cardiac history started in January 2005  when he experienced an episode of chest pain and was evaluated with some ST  segment depression.  A perfusion scan was subsequently performed which  showed no evidence of significant ischemia or infarction with a normal  ejection fraction.  The patient was treated medically.  He did well until  approximately two weeks prior to admission when he began to experience  increasing chest pain with exertion.  This was accompanied by shortness of  breath and some radiation to the left lateral chest.  The patient was  evaluated by Dr. Antoine Poche and scheduled for an outpatient cardiac  catheterization which was performed on Jul 06, 2004.  This revealed severe  two vessel coronary artery disease.  The ejection fraction was mildly  reduced with apical anterior hypokinesia.  He was,  therefore, admitted and  scheduled for cardiac surgery consultation.  Dr. Kathlee Nations Trigt of the CVTS  service consulted on the patient on Jul 06, 2004, and it was his opinion that  the patient should proceed with elective coronary artery bypass graft  surgery.   HOSPITAL COURSE:  The patient was admitted status post cardiac  catheterization which revealed two vessel coronary artery disease as  previously stated.  Dr. Donata Clay evaluated the patient and it was his  opinion that he should proceed with elective surgical revascularization.  The patient was maintained on routine hospital care and was in stable  condition prior to surgery with the exception of a right groin  pseudoaneurysm that formed status post cardiac catheterization.  This was  evaluated and resolved with ultrasound guided compression.  The patient has  had no further issues from his pseudoaneurysm.   The patient was taken to the OR on Jul 09, 2004, for coronary artery bypass  grafting x 4.  The left internal mammary artery was grafted to  the LAD,  saphenous vein was grafted to the right coronary artery, saphenous vein was  grafted to the circumflex, and saphenous vein graft was grafted to the  diagonal.  Endoscopic graft harvesting was performed on the left lower  extremity.  The patient tolerated the procedure well and was hemodynamically  stable immediately postoperatively.  The patient was transferred from the OR  to the SICU in stable condition.  The patient was extubated without  complications and woke up from anesthesia neurologically intact.   The patient's postoperative course progressed as expected.  On postoperative  day one, he was afebrile with stable vital signs and maintaining a normal  sinus rhythm.  All invasive lines and chest tubes were discontinued in a  routine manner and all drips were weaned accordingly.  The patient has been  volume overloaded postoperatively and has been diuresed accordingly.   The  patient began cardiac rehab on postoperative day one and tolerated this  quite well and is functioning at an acceptable level at this time.  On  postoperative day three, the patient's only complaint is of mild soreness.  He is afebrile with stable vital signs and continues to maintain a normal  sinus rhythm.  On physical exam, the incisions are clean and dry.  Cardiac  is regular rate and rhythm.  The lungs have a few basilar crackles.  There  is no edema present in the bilateral lower extremities.  The patient did  have a mild acute blood loss anemia which is stable at the time and is being  monitored.  The patient is in, overall, stable condition at this time and as  long as he continues to progress in the current manner, will be ready for  discharge in the next 1-2 days pending morning round re-evaluation.   LABORATORY DATA:  CBC on Jul 11, 2004, hemoglobin 10.4, hematocrit 29.6,  white count 10.4, platelets 113.  BMP on Jul 11, 2004, sodium 137, potassium  3.8, BUN 12, creatinine 0.9, glucose 131.   CONDITION ON DISCHARGE:  Improved.   DISCHARGE MEDICATIONS:  1.  Aspirin 81 mg daily.  2.  Toprol XL 50 mg daily.  3.  Plavix 75 mg daily x 3 days.  4.  Niacin 500 mg daily.  5.  Vytorin 10/40 mg daily.  6.  Lasix 40 mg daily x 7 days.  7.  K-Dur 20 mEq daily x 7 days.  8.  Tylox 1-2 p.o. q.4-6h. p.r.n. pain.   DISCHARGE INSTRUCTIONS:  Activities:  No driving and no lifting more than 10  pounds and the patient is to continue daily breathing and walking exercises.  Diet:  Low salt, low fat.  Wound care:  The patient may shower daily and  clean the incisions with soap and water.  The patient is instructed to call  the CVTS office if wound problems arise.  Follow up appointment with Dr.  Antoine Poche, this appointment will be scheduled for patient approximately two  weeks after discharge.  A chest x-ray will be taken at that time which will be given to the patient to bring with him  to his follow up appointment with  Dr. Donata Clay next on Jul 27, 2004, at 12:30.      AY/MEDQ  D:  07/12/2004  T:  07/12/2004  Job:  [id number]   cc:   Rollene Rotunda, M.D.

## 2010-07-20 NOTE — Op Note (Signed)
NAMEHUTTON, PELLICANE                ACCOUNT NO.:  [id number]   MEDICAL RECORD NO.:  [id number]          PATIENT TYPE:  AMB   LOCATION:  [facility]                         FACILITY:  [facility]   PHYSICIAN:  Ollen Gross, M.D.    DATE OF BIRTH:  [birth year]   DATE OF PROCEDURE:  05/07/2006  DATE OF DISCHARGE:                               OPERATIVE REPORT   PREOPERATIVE DIAGNOSIS:  Right knee medial meniscal tear.   POSTOPERATIVE DIAGNOSIS:  Right knee medial meniscal tear.   PROCEDURE:  Right knee arthroscopy with medial meniscal debridement.   SURGEON:  Ollen Gross, M.D.  No assistant.   ANESTHESIA:  General.   ESTIMATED BLOOD LOSS:  Minimal.   DRAINS:  None.   COMPLICATIONS:  None.   CONDITION:  Stable to recovery.   BRIEF CLINICAL NOTE:  Mr. [name] [name] is a 70 year old male who has some mild  degenerative change, right knee, but had onset of significant pain and  mechanical symptoms medially.  Exam and history suggested a meniscal  tear, confirmed by MRI.  He presents now for arthroscopy and  debridement.   PROCEDURE IN DETAIL:  After successful administration of general  anesthetic, a tourniquet is placed high on the right thigh and right  lower extremity prepped and draped in the usual sterile fashion.  Standard superomedial and inferolateral incisions made, inflow cannula  passed superomedial, camera passed inferolateral.  Arthroscopic  visualization proceeds.  Undersurface of the patella shows some grade 2  changes as the trochlea does also with grade 2 and 3 change but no  exposed bone.  The medial and lateral gutters are visualized and there  are no loose bodies.  Flexion and valgus force is applied to the knee  and the medial compartment is entered.  He does have some grade 3 and 4  change medial femoral condyle, mainly grade 3, small focal areas of  grade 4.  He does have evidence of a degenerative tear in the body and  posterior horn of the medial meniscus.  A spinal  needle is used to  localize the inferomedial portal, a small incision made, dilator placed,  and the meniscus debrided back to a stable base with baskets and a 4.2  mm shaver and sealed off with the ArthroCare device.  There was no  evidence of any loose cartilage on the surface of the medial femoral  condyle, thus we did not need to do a chondroplasty.  The intercondylar  notch is then visualized.  There was some osteophyte but the ACL appears  and probes normally.  The lateral compartment os entered and it looks  normal.  The patellofemoral compartment is again addressed and there is  no unstable cartilage.  The joint is again inspected.  There are no  further cartilaginous tears or loose bodies.  The arthroscopic equipment  is then removed from the inferior portals, which are closed with  interrupted 4-0 nylon.  Marcaine 0.25% with epinephrine 20 mL are injected through the inflow  cannula, and that is removed and that portal closed with nylon.  A bulky  sterile dressing is  applied and he is awakened and transferred to  recovery in stable condition.      Ollen Gross, M.D.  Electronically Signed     FA/MEDQ  D:  05/07/2006  T:  05/07/2006  Job:  [id number]

## 2010-07-20 NOTE — Assessment & Plan Note (Signed)
[facility]                            CARDIOLOGY OFFICE NOTE   [name] [name], [name] [name]                         MRN:          [id number]  DATE:05/01/2006                            DOB:          08/20/1940    PRIMARY CARE PHYSICIAN:  Ernestina Penna, M.D.   REASON FOR PROCEDURE:  Evaluate patient with coronary disease, status  post CABG.  He is preop for an arthroscopic knee surgery.  He is now 70  years old.  He has had no problems since I last saw him other than knee  pain.  He denies any chest discomfort similar to his previous angina.  He has had no neck or arm discomfort.  He has had no palpitations,  presyncope, or syncope.  He has had no PND or orthopnea.  He tries to  still remain active, doing elliptical work and treadmill work, as his  knee allows.   PAST MEDICAL HISTORY:  1. Coronary artery disease, status post CABG in 2006 (LIMA to the LAD,      SVG to the right coronary artery, SVG to circumflex, SVG to      diagonal).  2. Dyslipidemia.  3. Colon polyps.  4. Hiatal hernia.  5. Herniated disk.  6. Hypertension.  7. Mole removed in 2003.  8. Tonsillectomy.   ALLERGIES:  None.   MEDICATIONS:  Multivitamin, garlic, Coenzyme Q, aspirin 81 mg daily,  Toprol 25 mg b.i.d., fish oil, flaxseed oil, Niaspan 500 mg daily,  Lipitor 40 mg daily, Osteo Bi-Flex.   REVIEW OF SYSTEMS:  As stated in the HPI, otherwise negative for other  systems.   PHYSICAL EXAMINATION:  GENERAL:  Patient is in no distress.  VITAL SIGNS:  Blood pressure 144/80, heart rate 52 and regular. Weight  215 pounds.  Body Mass Index 28.  HEENT:  Eyelids unremarkable.  Pupils are equal, round and reactive to  light.  Fundi not visualized.  Oral mucosa unremarkable.  NECK:  No jugular venous distention.  Wave form within normal limits.  Carotid upstroke brisk and symmetric.  No bruits, no thyromegaly.  LYMPHATICS:  No cervical, axillary, or inguinal adenopathy.  LUNGS:   Clear to auscultation bilaterally.  BACK:  No costovertebral angle tenderness.  CHEST:  A well-healed sternotomy scar.  HEART:  PMI not displaced or sustained.  S1 and S2 within normal limits.  No S3, no S4, no clicks, rubs, or murmurs.  ABDOMEN:  Flat, positive bowel sounds.  Normal in frequency and pitch.  No bruits, rebound, guarding.  There are no midline pulsatile masses.  No hepatomegaly, splenomegaly.  SKIN:  No rashes, no nodules.  EXTREMITIES:  Pulses 2+ throughout.  No cyanosis, no clubbing, no edema.  NEUROLOGIC:  Oriented to person, place, and time.  Cranial nerves II-XII  grossly intact.  Motor grossly intact throughout.   EKG:  Sinus bradycardia, rate 52, first-degree AV block, axis within  normal limits, intervals within normal limits, no acute ST-T wave  changes.   ASSESSMENT/PLAN:  1. Coronary disease:  The patient is doing well with respect to this.  He is having no cardiovascular symptoms.  He has been active.  He      is practicing secondary risk reduction.  No further cardiovascular      testing is suggested.  2. Preoperative evaluation:  Based on the above and the low risk      nature of the procedure, he is at acceptable risk for the planned      surgery without further cardiovascular studies.  He should remain      on his medications, in particular his beta blocker.  He certainly      can come off his aspirin as needed.  3. Hypertension:  Blood pressure is very slightly elevated; however,      this is unusual, and he keeps it at home.  He will follow this with      Dr. Christell Constant and remain on the medications as listed.  4. Followup:  I would like to see him back yearly or sooner if needed.     Rollene Rotunda, MD, [facility]  Electronically Signed    JH/MedQ  DD: 05/01/2006  DT: 05/01/2006  Job #: [id number]   cc:   Ernestina Penna, M.D.  Ollen Gross, M.D.

## 2010-07-20 NOTE — Procedures (Signed)
[place]  Patient:    [name], [name] Visit Number: [id number] MRN: [id number]          Service Type: END Location: ENDO Attending Physician:  Nelda Marseille Dictated by:   Petra Kuba, M.D. Proc. Date: 12/17/00 Admit Date:  12/17/2000   CC:         Monica Becton, M.D.                           Procedure Report  PROCEDURE PERFORMED:  Colonoscopy with biopsy.  ENDOSCOPIST:  Petra Kuba, M.D.  INDICATIONS FOR PROCEDURE:  Patient with history of colon polyps, family history of colon polyps, due for repeat screening.  Consent was signed after risks, benefits, methods, and options were thoroughly discussed multiple times in the past.  MEDICATIONS USED:  Fentanyl 75 mcg, Versed 8 mg.  DESCRIPTION OF PROCEDURE:  Rectal inspection was pertinent for external hemorrhoids.  Digital exam was negative.  Pediatric video adjustable colonoscope was inserted, fairly easily advanced around the colon to the cecum.  This did require rolling him on his back and some abdominal pressure. On insertion, a tiny rectal sigmoid junction polyp was seen and was cold biopsied x 1.  No other abnormalities were seen as we advanced to the cecum. The scope was inserted a short ways into the terminal ileum which was pertinent for a few scattered erosions and ulcers but no obvious inflammation and the remaining terminal ileum seen looked normal.  Scattered biopsies were obtained.  The scope was then slowly withdrawn.  The prep in the colon was adequate.  There was some liquid stool that required washing and suctioning. In the midascending, a questionable small sessile versus edematous fold was seen and was cold biopsied x 2 and put in a third container.  The scope was further withdrawn.  No additional findings were seen as we slowly withdrew back to the rectosigmoid junction where the polyp seen on insertion was refound and rebiopsied x 2.  The scope was  further withdrawn back to the rectum, retroflexed, pertinent for some internal hemorrhoids.  Scope was straightened and readvanced a short ways up the left side of the colon.  Air was suctioned, scope removed.  The patient tolerated the procedure well. There was no obvious immediate complication.  ENDOSCOPIC DIAGNOSIS: 1. Internal and external hemorrhoids. 2. Tiny rectal sigmoid junction polyp cold biopsied. 3. Questionable ascending polyp status post biopsy. 4. Few scattered terminal ileum ulcers without inflammation, status post    biopsy. 5. Otherwise within normal limits to the terminal ileum.  PLAN:  Await pathology.  Continue work-up for his abdominal pain with an endoscopy per his request.  Await pathology to determine future colonic screening. Dictated by:   Petra Kuba, M.D. Attending Physician:  Nelda Marseille DD:  12/17/00 TD:  12/18/00 Job: [id number]

## 2010-07-20 NOTE — H&P (Signed)
[name] [name], [name] [name] NO.:  [id number]   MEDICAL RECORD NO.:  [id number]          PATIENT TYPE:  OIB   LOCATION:  6501                         FACILITY:  [facility]   PHYSICIAN:  Vida Roller, M.D.   DATE OF BIRTH:  March 07, 1940   DATE OF ADMISSION:  07/06/2004  DATE OF DISCHARGE:                                HISTORY & PHYSICAL   PRIMARY CARE PHYSICIAN:  Dr. Vernon Prey.   CARDIOLOGIST:  Dr. Angelina Sheriff   HISTORY OF PRESENT ILLNESS:  [name] [name] is a 70 year old man who is followed  by Dr. Antoine Poche for chest discomfort.  He was sent for an outpatient  evaluation to include a heart catheterization.  This was performed on Jul 06, 2004 and he had significant left main coronary artery disease and will need  to be admitted for surgical evaluation and potential treatment.  His history  started back in January of 2005 when he had a hypertensive response, was  evaluated at that time with some ST segment depression.  A perfusion scan  done shortly thereafter showed no evidence of significant ischemia or  infarct with normal ejection fraction and he was treated for his  hypertension and has done well until approximately two weeks prior to  evaluation when he began to notice when he was mowing his lawn that he would  have discomfort in his chest which would start about three or four minutes  after he would start exercising he would get short of breath associated with  some radiation to his left lateral chest, but no radiation to his neck.  Denied any paroxysmal nocturnal dyspnea or orthopnea.  No palpitations,  presyncope, or syncope.  He is currently without any discomfort, resting  comfortably after his heart catheterization.   PAST MEDICAL HISTORY:  Significant for hyperlipidemia.  He has had colonic  polyps which have been resected with a colonoscope.  He has a hiatal hernia  and a lumbar disk which was herniated.  He also has a history of  hypertension.   PAST  SURGICAL HISTORY:  Had a mole removed in 2003 and had a tonsillectomy  as a child.   ALLERGIES:  He has none.   MEDICATIONS:  1.  Aspirin 81 mg once a day.  2.  Hydrochlorothiazide 25 mg once a day.  3.  Niaspan 500 mg once a day.  4.  Vytorin 10 and 40 once a day.   SOCIAL HISTORY:  He is a Solicitor at [employer].  He has two adult  children and one granddaughter./  He is married for 37 years.  He does not  smoke cigarettes nor drink alcohol or use illicit drugs.   FAMILY HISTORY:  Denies any history of coronary artery disease; however, his  father died of a myocardial infarction at age 28.   REVIEW OF SYSTEMS:  Completely negative.   PHYSICAL EXAM:  GENERAL:  He is a well-developed, well-nourished white male  in no apparent distress who is alert and oriented x4.  HEENT:  Unremarkable.  NECK:  Supple. There is no jugular venous  distension or carotid bruits.  CHEST:  Clear to auscultation bilaterally with normal respiratory movement.  CARDIOVASCULAR:  Regular.  He does have an S4, but no S3 and no murmurs are  noted.  ABDOMEN:  Soft, nontender with normoactive bowel sounds.  EXTREMITIES:  His lower extremities are without clubbing, cyanosis, or  edema.  His eye catheterization site is intact.  NEUROLOGIC:  Grossly nonfocal.   His electrocardiogram shows sinus rhythm at a rate of 77 with normal axes  and normal intervals.  He does have some anterior ST segment nonspecific  changes.   LABORATORY:  White blood cell count 6.6, H&H of 15 and 45 with a platelet  count 174.  INR of 1.1, PT of 12.5.  Sodium of 142, potassium 3.7, chloride  106, bicarbonate 29, BUN 17, creatinine of 1, and his blood sugar is 97, a  calcium of 9.7.  His PTT is 24.7.  Chest x-ray is pending.   This is a gentleman who has left main coronary disease and will need a  surgical evaluation who has a significant stenosis on catheterization which  was dictated today.  He does have chest pain which  appears to be classic  angina.  He has hypertension and hyperlipidemia which are entirely well  controlled and he has a tortuous aorta on catheterization which will need to  be evaluated with a CT scan.  I am going to admit him to telemetry, continue  him on the medications he is on.  I will probably add a beta blocker to his  medical therapy as well as continue to use the Statin and the Niaspan.  Will  also add an aspirin to his therapy.  He will need a CT scan once he is  cleared the dye from his catheterization.      JH/MEDQ  D:  07/06/2004  T:  07/06/2004  Job:  [id number]

## 2010-07-20 NOTE — Op Note (Signed)
NAMEZYLEN, WENIG NO.:  [id number]   MEDICAL RECORD NO.:  [id number]          PATIENT TYPE:  INP   LOCATION:  2309                         FACILITY:  [facility]   PHYSICIAN:  Kerin Perna, M.D.  DATE OF BIRTH:  [birth year]   DATE OF PROCEDURE:  07/09/2004  DATE OF DISCHARGE:                                 OPERATIVE REPORT   REFERRING PHYSICIAN:  Vida Roller, M.D.   OPERATION:  Coronary artery bypass grafting x4 (left internal mammary artery  to LAD, saphenous vein graft to diagonal, saphenous vein graft to OM I,  saphenous vein graft to distal RCA).   POSTOPERATIVE DIAGNOSIS:  Class IV unstable angina with severe two-vessel  coronary disease and left main stenosis.   SURGEON:  Kerin Perna, M.D.   ASSISTANT:  Coral Ceo, PA-C.   ANESTHESIA:  General by Dr. Judie Petit.   INDICATIONS:  The patient is a 70 year old male who presented with symptoms  of unstable angina. Cardiac catheterization by Dr. Vida Roller  demonstrated a 80% left main stenosis with high-grade 90% stenosis of the  LAD diagonal and 60% stenosis of the mid right coronary. His ejection  fraction was mildly reduced with apical anterior hypokinesia. He is felt to  be candidate for surgical coronary revascularization.   Prior to surgery, I examined the patient in his hospital room and reviewed  results of the cardiac catheterization with the patient and family. I  discussed the indications and expected benefits of coronary artery bypass  surgery for treatment of his coronary artery disease. I reviewed with the  patient major aspects of the planned procedure including the choice of  conduit to include internal mammary artery and endoscopically harvested  saphenous vein, the location of the surgical incisions, the use of general  anesthesia and cardiopulmonary bypass, and the expected postoperative  hospital recovery. I reviewed with the patient the major risks to him of  coronary artery bypass surgery including risks of MI, CVA, infection, blood  transfusion requirement, and death. He understood these implications for the  surgery, and the alternatives to surgery, and agreed to proceed with surgery  under what I felt was an informed consent.   OPERATIVE FINDINGS:  The patient had a right femoral pseudoaneurysm from the  cardiac cath site which was treated with compression by the vascular lab in  the preop holding area prior to being induced for general anesthesia. This  successfully treated the pseudoaneurysm. The patient was given the aprotinin  protocol because of his clinical evidence of bleeding disposition. His  preoperative creatinine was normal. The patient did not receive any packed  cell transfusions in surgery, but did require a unit of platelets for a  platelet count of 90,000 while on bypass. The patient's coronaries were  severely and diffusely diseased. The LAD and circumflex vessels especially.   PROCEDURE:  The patient was brought to operative room, placed supine on the  operating table where general anesthesia was induced under invasive  hemodynamic monitoring. The chest, abdomen and legs were prepped with  Betadine and draped as a sterile field. A  sternal incision was made as the  saphenous vein was harvested endoscopically from the left leg. It was of  average to above average quality. The left internal mammary artery was  harvested as a pedicle graft from its origin at the subclavian vessels and  was a 1.5 mm vessel with good flow. Heparin was administered systemically  and the ACT was documented as being therapeutic for the aprotinin protocol  used for this patient. A sternal retractor was placed and the pericardium  opened and suspended. Pursestrings were placed in the ascending aorta and  right atrium and the patient was cannulated and placed on bypass. The  coronaries were identified for grafting in the mammary artery and vein   grafts were prepared for the distal anastomoses. A cardioplegia cannula was  placed in the ascending aorta as well as into the right atrium and the  coronary sinus for both the with antegrade and retrograde cardioplegia. The  patient was cooled to 32 degrees. Aortic crossclamp was then applied.  Cardioplegia, 300 mL, was delivered in the aortic root, however, there is  evidence of some aortic insufficiency and for that reason the remainder of  the cardioplegia which measured 500 mL was given into the coronary sinus  with a retrograde catheter. Using the retrograde cardioplegia, there was  good cardioplegic arrest and septal temperature dropped to 12 degrees.  Topical iced saline was used to augment myocardial preservation and a  pericardial insulator pad was used protect left phrenic nerve.   The distal coronary anastomoses were then performed. The first distal  anastomosis was to the distal right coronary. This was a 1.6 mm vessel with  proximal 50-60% stenosis. There is thickening and cholesterol deposits in  the wall of the right coronary diffusely. A reverse saphenous vein was sewn  end-to-side with running 7-0 Prolene and there was good flow through graft.  The second distal anastomosis was the diagonal. This is a 1.5 mm vessel with  proximal 90% calcified stenosis. A reverse saphenous vein was sewn end-to-  side with running 7-0 Prolene with good flow through graft. The third distal  anastomosis was the OM I. This is a 1.7 mm vessel with proximal left main  80% stenosis. A reverse saphenous vein was sewn end-to-side with running 7-0  Prolene with good flow through graft. It was noted that the distal aspect of  this vessel had a long segment of cholesterol, plaque and calcium with  thickening and sclerosis of the wall. Beyond of a long segment disease the  vessel was too small to graft. Cardioplegia was redosed. Before the mammary artery was sewn, it was noted that the vein grafts  which had been precut and  measured for the diagonal and circumflex grafts were inadvertently  misidentified by the scrub nurse and the veins had been anastomosed to the  wrong vessels. For that reason, these two bypass grafts were detached,  trimmed and sewn again with end-to-side anastomoses and each graft had  excellent flow. After cardioplegia was were again redosed, the mammary  artery was sewn to the distal LAD. The LAD was sewn fairly distally due to  diffuse sclerosis and thickening of the LAD down until just before the apex.  There was excellent flow through the anastomosis after briefly releasing the  pedicle bulldog on the mammary pedicle and then this was replaced. The  pedicle secured epicardium and cardioplegia was redosed.   While the crossclamp was still in place, three proximal vein anastomoses  were placed  on the ascending aorta using a 4.0 mm punch and running 6-0  Prolene. The air was vented from the coronaries in the left side of the  heart using a dose of retrograde warm blood cardioplegia prior to tying down  the final proximal anastomosis. After air had been removed from the left  side of heart, proximal anastomosis was tied and the crossclamp was removed.   The heart resumed a spontaneous rhythm. Air was aspirated from the vein  grafts with a 27 gauge needle and vein grafts were opened and each had good  flow. Cardioplegia catheters were removed. The patient was noted to have  good hemostasis at both the proximal and distal coronary anastomoses. The  patient was rewarmed to 37 degrees. Temporary pacing wires were applied.  When the patient reached 37 degrees, the lungs re-expanded and the  ventilator was resumed. The patient was then weaned from bypass without  difficulty. Cardiac output and blood pressure stable. Protamine was  administered without adverse reaction. The cannula was removed and the  mediastinum was irrigated warm antibiotic irrigation.  The leg  incision was  irrigated and closed in a standard fashion. Because the patient's platelet  count was low at 90,000 and there was diffuse coagulation dysfunction,  a  unit of platelets was transfused and this improved the coagulation and the  oozing. The superior pericardial fat was closed over the aorta and vein  grafts. Two mediastinal and a left pleural chest tube were placed brought  out through  separate incisions. The sternum was closed with interrupted steel wire. The  pectoralis fascia was closed with a running #1 Vicryl. Subcutaneous and skin  were closed with Vicryl and sterile dressings were applied. Total bypass  time was 160 minutes with crossclamp time 120 minutes.      PV/MEDQ  D:  07/09/2004  T:  07/09/2004  Job:  [id number]   cc:   Vida Roller, M.D.  Fax: [phone]   [facility]   CVTS office

## 2010-07-20 NOTE — Discharge Summary (Signed)
NAMESRIMAN, TALLY NO.:  [id number]   MEDICAL RECORD NO.:  [id number]          PATIENT TYPE:  OIB   LOCATION:  6501                         FACILITY:  [facility]   PHYSICIAN:  Vida Roller, M.D.   DATE OF BIRTH:  [birth year]   DATE OF ADMISSION:  07/06/2004  DATE OF DISCHARGE:                                 DISCHARGE SUMMARY   PRIMARY CARE PHYSICIAN:  [place]. [place]   CARDIOLOGIST:  Dr. Rollene Rotunda.   HISTORY OF PRESENT ILLNESS:  [name] [name] is a 70 year old man with  hypertension and chest discomfort concerning for angina.  He had a perfusion  study done about a year ago which showed no significant ischemia and normal  LV function, but began having exertional discomfort in his chest, was  evaluated on May 3 by Dr. Antoine Poche who arranged for him to have a heart  catheterization.  He has a history of hyperlipidemia and hypertension.  He  was referred for heart catheterization.   DETAILS OF PROCEDURE:  After obtaining informed consent, the patient was  brought to the cardiac catheterization laboratory in the fasting state.  There, he was prepped and draped in the usual sterile manner and local  anesthetic was obtained over the right groin using 1% lidocaine without  epinephrine.  The right femoral artery was cannulated with some difficulty  in the modified Seldinger technique with a 4-French 10 cm sheath, and left  heart catheterization was performed, and using a 4-French JL 3.5 and a 4-  [place] multipurpose catheter.  The JL 3.5 cannulated in the left main, and  the multipurpose catheter cannulated in the right coronary.  We also used a  6-French pigtail catheter which was used for left ventriculography in the 30  degree RAO view.  The patient has a relatively tortuous aorta and multiple  catheters were attempted to try to cannulate the left main coronary artery.  We were finally successful with the JL 3.5.  the right coronary was  successfully cannulated using the multipurpose A1.  At the conclusion of the  procedure, the catheters were removed.  The patient was moved back to the  cardiology holding area where the femoral artery sheath was removed.  Hemostasis was obtained using direct manual pressure.  There was a small  hematoma present, but distal pulses were intact.  Total fluoroscopic time  was 15.1 minutes.  Total ionized contrast was 150 cc.   RESULTS:  The left main coronary artery is a large artery which is severely  diseased.  Has a 70-80% distal lesion which involves the ostium of both the  circumflex and left anterior descending coronary artery.   The left anterior descending coronary artery is a moderate caliber  transapical vessel which is severely diseased in its proximal portion and  involves the first septal perforator.  There is a 90 followed by another 90  in the proximal to mid portions of the artery.   The left circumflex coronary artery is a large nondominant vessel which has  a single large obtuse marginal which runs along the  anterior lateral wall.  There are several small obtuse marginals as well.  There is about a 20-30%  stenosis in the proximal portion of the circumflex coronary artery.   The right coronary artery is a moderate caliber dominant vessel which has  luminal irregularities in it mid portion.  It gives rise to a small  posterior descending coronary artery and a small posterolateral branch which  are free of disease.   The left ventriculogram reveals preserved left ventricular systolic function  with an estimated ejection fraction of 50%.  No mitral regurgitation.  No  wall motion abnormalities were seen.   ASSESSMENT:  Severe left main coronary and left anterior descending coronary  artery disease.   PLAN:  Admit to the telemetry unit and ask our surgical colleagues to  evaluate him for the potential for surgical revascularization in the left  side of his heart.  He  will also need a CT scan of his chest because of the  significant tortuosity of his aorta, worrying for an aortic aneurysm.      JH/MEDQ  D:  07/06/2004  T:  07/06/2004  Job:  [id number]

## 2010-07-20 NOTE — Procedures (Signed)
[facility]  Patient:    [name] [name], [name] [name] Visit Number: [id number] MRN: [id number]          Service Type: END Location: ENDO Attending Physician:  Nelda Marseille Dictated by:   Petra Kuba, M.D. Proc. Date: 12/17/00 Admit Date:  12/17/2000                             Procedure Report  PROCEDURE:  Esophagogastroduodenoscopy with biopsy.  SURGEON:  Petra Kuba, M.D.  INDICATIONS:  Patient with upper abdominal pain.  Requesting endoscopy while here and sedated.  Consent was signed prior to any premedications given after risks, benefits, methods, and options were thoroughly discussed in the past. Additional medicines for this procedure, 25 mcg of fentanyl and Versed 4.  DESCRIPTION OF PROCEDURE:  The video endoscope was inserted by direct vision. The proximal and mid esophagus were normal.  In the distal esophagus was a small hiatal hernia with a widely patent rim.  The scope was inserted into the stomach and advanced through a normal antrum and normal pylorus into a normal duodenal bulb and around the cilia to a normal second portion of the duodenum. The scope was withdrawn back to the bulb and a good look there ruled out ulcers in that location.  The scope was withdrawn back to the stomach and retroflexed.  The angularis and fundus were normal.  High in the cardia the hiatal hernia was confirmed.  The lesser and greater curve were evaluated on retroflexion and under straight visualization without additional findings. Air was suctioned and the scope slowly withdrawn.  Again, a good look at the esophagus was normal.  The scope was removed.  The patient tolerated the procedure well.  There were no obvious or immediate complications.  ENDOSCOPIC DIAGNOSES: 1. Small hiatal hernia with a widely patent ring. 2. Otherwise negative esophagogastroduodenoscopy.  PLAN:  Probably check his gallbladder next based on symptoms.  Happy to see back p.r.n.   Otherwise, can return care to Dr. Christell Constant for any further workup and plans and medicine trials. Dictated by:   Petra Kuba, M.D. Attending Physician:  Nelda Marseille DD:  12/17/00 TD:  12/18/00 Job: [id number]

## 2010-10-01 ENCOUNTER — Encounter: Payer: Self-pay | Admitting: Cardiology

## 2010-10-02 ENCOUNTER — Encounter: Payer: Self-pay | Admitting: Cardiology

## 2010-10-05 ENCOUNTER — Encounter: Payer: Self-pay | Admitting: Cardiology

## 2010-10-10 NOTE — Progress Notes (Signed)
Quick Note:  RN s/w Pt's Spouse: Dontel Harshberger re: Lab results being normal.  ______

## 2011-01-08 ENCOUNTER — Encounter: Payer: Self-pay | Admitting: Cardiology

## 2011-01-17 ENCOUNTER — Encounter: Payer: Self-pay | Admitting: Cardiology

## 2011-04-01 ENCOUNTER — Other Ambulatory Visit: Payer: Self-pay | Admitting: Cardiology

## 2011-04-24 DIAGNOSIS — E785 Hyperlipidemia, unspecified: Secondary | ICD-10-CM | POA: Diagnosis not present

## 2011-04-24 DIAGNOSIS — I10 Essential (primary) hypertension: Secondary | ICD-10-CM | POA: Diagnosis not present

## 2011-04-24 DIAGNOSIS — E559 Vitamin D deficiency, unspecified: Secondary | ICD-10-CM | POA: Diagnosis not present

## 2011-04-26 ENCOUNTER — Encounter: Payer: Self-pay | Admitting: Cardiology

## 2011-04-30 ENCOUNTER — Encounter: Payer: Self-pay | Admitting: Cardiology

## 2011-04-30 ENCOUNTER — Ambulatory Visit (INDEPENDENT_AMBULATORY_CARE_PROVIDER_SITE_OTHER): Payer: Medicare Other | Admitting: Cardiology

## 2011-04-30 VITALS — BP 140/80 | HR 57 | Ht 73.0 in | Wt 207.0 lb

## 2011-04-30 DIAGNOSIS — I2510 Atherosclerotic heart disease of native coronary artery without angina pectoris: Secondary | ICD-10-CM

## 2011-04-30 DIAGNOSIS — E785 Hyperlipidemia, unspecified: Secondary | ICD-10-CM | POA: Diagnosis not present

## 2011-04-30 DIAGNOSIS — I4949 Other premature depolarization: Secondary | ICD-10-CM | POA: Diagnosis not present

## 2011-04-30 MED ORDER — METOPROLOL TARTRATE 25 MG PO TABS: 25.0000 mg | ORAL_TABLET | Freq: Two times a day (BID) | ORAL | Status: DC

## 2011-04-30 NOTE — Assessment & Plan Note (Signed)
I was able to review a lipomed profile from November. He says there is a more recent one. His lipids are expertly manage. I made no change to his regimen.

## 2011-04-30 NOTE — Patient Instructions (Signed)
Your physician has requested that you have an exercise tolerance test. For further information please visit [email or web address]. Please also follow instruction sheet, as given.  The current medical regimen is effective;  continue present plan and medications.  

## 2011-04-30 NOTE — Progress Notes (Signed)
HPI The patient presents for follow up of CAD.  Since I last saw him he has been doing well. He golfs 3 times per week and goes to the gym twice per week. He walks when the weather allows. With this level of exertion he denies any new symptoms.  The patient denies any new symptoms such as chest discomfort, neck or arm discomfort. There has been no new shortness of breath, PND or orthopnea. There have been no reported palpitations, presyncope or syncope.    Allergies  Allergen Reactions  . Levofloxacin     Current Outpatient Prescriptions  Medication Sig Dispense Refill  . aspirin 81 MG tablet Take 81-162 mg by mouth daily.        Marland Kitchen atorvastatin (LIPITOR) 20 MG tablet Take 20 mg by mouth daily.        . Coenzyme Q10 (COQ10) 100 MG CAPS Take 1 capsule by mouth 2 (two) times daily.        . Cyanocobalamin (VITAMIN B12 PO) Take by mouth daily.      . metoprolol tartrate (LOPRESSOR) 25 MG tablet TAKE  (1)  TABLET TWICE A DAY.  60 tablet  6  . Multiple Vitamin (MULTIVITAMIN) tablet Take 1 tablet by mouth daily.        . niacin (NIASPAN) 1000 MG CR tablet Take 1,000 mg by mouth daily.        . Omega-3 Fatty Acids (FISH OIL) 1000 MG CAPS Take 2 capsules by mouth daily. 1000 mg?       . Vitamins A & D (VITAMIN A & D) 5000-400 UNITS CAPS Take 1 capsule by mouth daily.          Past Medical History  Diagnosis Date  . CAD (coronary artery disease)   . Dyslipidemia   . Colon polyps   . Hiatal hernia   . Herniated disc   . HTN (hypertension)     Past Surgical History  Procedure Date  . Mole removed 2003  . Right knee surgery 2008  . Tonsillectomy   . Coronary artery bypass graft 2006    LIMA to LAD, SVG to RCA, SVG to circumflex, SVG to diagonal     ROS:  Sinus trouble, right knee pain.  Epigastric discomfort.  Otherwise as stated in the HPI and negative for all other systems.  PHYSICAL EXAM BP 140/80  Pulse 57  Ht 6\' 1"  (1.854 m)  Wt 207 lb (93.895 kg)  BMI 27.31 kg/m2 GENERAL:   Well appearing HEENT:  Pupils equal round and reactive, fundi not visualized, oral mucosa unremarkable NECK:  No jugular venous distention, waveform within normal limits, carotid upstroke brisk and symmetric, no bruits, no thyromegaly LYMPHATICS:  No cervical, inguinal adenopathy LUNGS:  Clear to auscultation bilaterally BACK:  No CVA tenderness CHEST:  Well healed sternotomy scar. HEART:  PMI not displaced or sustained,S1 and S2 within normal limits, no S3, no S4, no clicks, no rubs, no murmurs ABD:  Flat, positive bowel sounds normal in frequency in pitch, no bruits, no rebound, no guarding, no midline pulsatile mass, no hepatomegaly, no splenomegaly EXT:  2 plus pulses throughout, no edema, no cyanosis no clubbing SKIN:  No rashes no nodules NEURO:  Cranial nerves II through XII grossly intact, motor grossly intact throughout PSYCH:  Cognitively intact, oriented to person place and time  EKG:  Sinus rhythm, rate 57, axis within normal limits, intervals within normal limits, no acute ST-T wave changes.  04/30/2011   ASSESSMENT AND  PLAN

## 2011-04-30 NOTE — Assessment & Plan Note (Signed)
I will bring the patient back for a POET (Plain Old Exercise Test). This will allow me to screen for obstructive coronary disease, risk stratify and very importantly provide a prescription for exercise.   

## 2011-04-30 NOTE — Assessment & Plan Note (Signed)
The blood pressure is at target. No change in medications is indicated. We will continue with therapeutic lifestyle changes (TLC).  

## 2011-04-30 NOTE — Assessment & Plan Note (Signed)
He is not bothered by these.  No change in therapy is indicated.

## 2011-05-02 ENCOUNTER — Other Ambulatory Visit: Payer: Self-pay | Admitting: Family Medicine

## 2011-05-02 DIAGNOSIS — R109 Unspecified abdominal pain: Secondary | ICD-10-CM | POA: Diagnosis not present

## 2011-05-07 ENCOUNTER — Ambulatory Visit
Admission: RE | Admit: 2011-05-07 | Discharge: 2011-05-07 | Disposition: A | Discharge status: Home / Self Care | Payer: Medicare Other | Source: Ambulatory Visit | Attending: Family Medicine | Admitting: Family Medicine

## 2011-05-07 DIAGNOSIS — N281 Cyst of kidney, acquired: Secondary | ICD-10-CM | POA: Diagnosis not present

## 2011-05-07 DIAGNOSIS — N200 Calculus of kidney: Secondary | ICD-10-CM | POA: Diagnosis not present

## 2011-05-07 DIAGNOSIS — K7689 Other specified diseases of liver: Secondary | ICD-10-CM | POA: Diagnosis not present

## 2011-05-23 ENCOUNTER — Encounter: Payer: Medicare Other | Admitting: Cardiology

## 2011-05-23 ENCOUNTER — Ambulatory Visit (INDEPENDENT_AMBULATORY_CARE_PROVIDER_SITE_OTHER): Payer: Medicare Other | Admitting: Cardiology

## 2011-05-23 DIAGNOSIS — I2510 Atherosclerotic heart disease of native coronary artery without angina pectoris: Secondary | ICD-10-CM

## 2011-05-23 NOTE — Progress Notes (Signed)
Exercise Treadmill Test  Pre-Exercise Testing Evaluation Rhythm: normal sinus  Rate: 68   PR:  .22 QRS:  .09  QT:  .41 QTc: .43     Test  Exercise Tolerance Test Ordering MD: Angelina Sheriff, MD  Interpreting MD:  Angelina Sheriff, MD  Unique Test No: 1  Treadmill:  1  Indication for ETT: known ASHD  Contraindication to ETT: No   Stress Modality: exercise - treadmill  Cardiac Imaging Performed: non   Protocol: standard Bruce - maximal  Max BP:  216/83  Max MPHR (bpm):  152 85% MPR (bpm):  129  MPHR obtained (bpm):  136 % MPHR obtained:  89  Reached 85% MPHR (min:sec):  6:24 Total Exercise Time (min-sec):  9:00  Workload in METS:  10.1 Borg Scale: 14  Reason ETT Terminated:  desired heart rate attained    ST Segment Analysis At Rest: normal ST segments - no evidence of significant ST depression With Exercise: no evidence of significant ST depression  Other Information Arrhythmia:  Yes Angina during ETT:  absent (0) Quality of ETT:  diagnostic  ETT Interpretation:  normal - no evidence of ischemia by ST analysis  Comments: The patient had an excellent exercise tolerance.  There was no chest pain.  There was an appropriate level of dyspnea.  There were were occasional PVCs and a normal heart rate response.  There was a bit of an accelerated BP response.  There were no ischemic ST T wave changes and a normal heart rate recovery.  Recommendations: Negative adequate ETT.  No further testing is indicated.  Based on the above no further testing is indicated.  He can continue the current exercise regime.

## 2011-06-04 DIAGNOSIS — R1011 Right upper quadrant pain: Secondary | ICD-10-CM | POA: Diagnosis not present

## 2011-06-04 DIAGNOSIS — N200 Calculus of kidney: Secondary | ICD-10-CM | POA: Diagnosis not present

## 2011-07-25 ENCOUNTER — Encounter: Payer: Self-pay | Admitting: Cardiology

## 2011-07-25 DIAGNOSIS — E559 Vitamin D deficiency, unspecified: Secondary | ICD-10-CM | POA: Diagnosis not present

## 2011-07-25 DIAGNOSIS — E785 Hyperlipidemia, unspecified: Secondary | ICD-10-CM | POA: Diagnosis not present

## 2011-07-25 DIAGNOSIS — I10 Essential (primary) hypertension: Secondary | ICD-10-CM | POA: Diagnosis not present

## 2011-08-01 DIAGNOSIS — Z1212 Encounter for screening for malignant neoplasm of rectum: Secondary | ICD-10-CM | POA: Diagnosis not present

## 2011-08-01 DIAGNOSIS — N400 Enlarged prostate without lower urinary tract symptoms: Secondary | ICD-10-CM | POA: Diagnosis not present

## 2011-08-01 DIAGNOSIS — Z125 Encounter for screening for malignant neoplasm of prostate: Secondary | ICD-10-CM | POA: Diagnosis not present

## 2011-08-01 DIAGNOSIS — E785 Hyperlipidemia, unspecified: Secondary | ICD-10-CM | POA: Diagnosis not present

## 2011-08-01 DIAGNOSIS — I2510 Atherosclerotic heart disease of native coronary artery without angina pectoris: Secondary | ICD-10-CM | POA: Diagnosis not present

## 2011-08-14 ENCOUNTER — Encounter: Payer: Self-pay | Admitting: Cardiology

## 2011-09-27 DIAGNOSIS — Z1212 Encounter for screening for malignant neoplasm of rectum: Secondary | ICD-10-CM | POA: Diagnosis not present

## 2011-11-02 ENCOUNTER — Other Ambulatory Visit: Payer: Self-pay | Admitting: Cardiology

## 2011-11-05 NOTE — Telephone Encounter (Signed)
..   Requested Prescriptions   Pending Prescriptions Disp Refills  . metoprolol tartrate (LOPRESSOR) 25 MG tablet [Pharmacy Med Name: METOPROLOL 25MG  TABLET] 60 tablet 6    Sig: TAKE  (1)  TABLET TWICE A DAY.

## 2011-11-15 DIAGNOSIS — R5383 Other fatigue: Secondary | ICD-10-CM | POA: Diagnosis not present

## 2011-11-15 DIAGNOSIS — E559 Vitamin D deficiency, unspecified: Secondary | ICD-10-CM | POA: Diagnosis not present

## 2011-11-15 DIAGNOSIS — I10 Essential (primary) hypertension: Secondary | ICD-10-CM | POA: Diagnosis not present

## 2011-11-15 DIAGNOSIS — E785 Hyperlipidemia, unspecified: Secondary | ICD-10-CM | POA: Diagnosis not present

## 2011-11-21 DIAGNOSIS — I2510 Atherosclerotic heart disease of native coronary artery without angina pectoris: Secondary | ICD-10-CM | POA: Diagnosis not present

## 2011-11-21 DIAGNOSIS — R319 Hematuria, unspecified: Secondary | ICD-10-CM | POA: Diagnosis not present

## 2011-11-21 DIAGNOSIS — H2510 Age-related nuclear cataract, unspecified eye: Secondary | ICD-10-CM | POA: Diagnosis not present

## 2011-12-26 DIAGNOSIS — Z23 Encounter for immunization: Secondary | ICD-10-CM | POA: Diagnosis not present

## 2012-03-11 ENCOUNTER — Encounter: Payer: Self-pay | Admitting: Cardiology

## 2012-03-11 DIAGNOSIS — E785 Hyperlipidemia, unspecified: Secondary | ICD-10-CM | POA: Diagnosis not present

## 2012-03-11 DIAGNOSIS — E559 Vitamin D deficiency, unspecified: Secondary | ICD-10-CM | POA: Diagnosis not present

## 2012-03-11 DIAGNOSIS — I2510 Atherosclerotic heart disease of native coronary artery without angina pectoris: Secondary | ICD-10-CM | POA: Diagnosis not present

## 2012-03-19 DIAGNOSIS — N400 Enlarged prostate without lower urinary tract symptoms: Secondary | ICD-10-CM | POA: Diagnosis not present

## 2012-03-19 DIAGNOSIS — E785 Hyperlipidemia, unspecified: Secondary | ICD-10-CM | POA: Diagnosis not present

## 2012-04-21 DIAGNOSIS — D239 Other benign neoplasm of skin, unspecified: Secondary | ICD-10-CM | POA: Diagnosis not present

## 2012-04-21 DIAGNOSIS — Q828 Other specified congenital malformations of skin: Secondary | ICD-10-CM | POA: Diagnosis not present

## 2012-04-21 DIAGNOSIS — L821 Other seborrheic keratosis: Secondary | ICD-10-CM | POA: Diagnosis not present

## 2012-05-19 ENCOUNTER — Telehealth: Payer: Self-pay | Admitting: Family Medicine

## 2012-05-19 NOTE — Telephone Encounter (Signed)
KAY PLEASE CALL NEEDS TO SEE MOORE  RIGHT SIDE HURTS

## 2012-05-20 ENCOUNTER — Ambulatory Visit (INDEPENDENT_AMBULATORY_CARE_PROVIDER_SITE_OTHER): Payer: Medicare Other | Admitting: Family Medicine

## 2012-05-20 ENCOUNTER — Encounter: Payer: Self-pay | Admitting: Family Medicine

## 2012-05-20 VITALS — BP 144/74 | HR 56 | Temp 97.5°F | Ht 72.0 in | Wt 210.0 lb

## 2012-05-20 DIAGNOSIS — R109 Unspecified abdominal pain: Secondary | ICD-10-CM | POA: Diagnosis not present

## 2012-05-20 DIAGNOSIS — M25551 Pain in right hip: Secondary | ICD-10-CM

## 2012-05-20 DIAGNOSIS — M25559 Pain in unspecified hip: Secondary | ICD-10-CM

## 2012-05-20 LAB — BASIC METABOLIC PANEL
BUN: 17 mg/dL (ref 6–23)
Chloride: 104 mEq/L (ref 96–112)
Glucose, Bld: 99 mg/dL (ref 70–99)
Potassium: 4.5 mEq/L (ref 3.5–5.3)

## 2012-05-20 LAB — POCT CBC
HCT, POC: 40.2 % — AB (ref 43.5–53.7)
Hemoglobin: 13.1 g/dL — AB (ref 14.1–18.1)
Lymph, poc: 1.9 (ref 0.6–3.4)
MCH, POC: 28.9 pg (ref 27–31.2)
MCHC: 32.5 g/dL (ref 31.8–35.4)
MCV: 88.9 fL (ref 80–97)
MPV: 7.4 fL (ref 0–99.8)
RBC: 4.5 M/uL — AB (ref 4.69–6.13)
WBC: 6.7 10*3/uL (ref 4.6–10.2)

## 2012-05-20 LAB — POCT UA - MICROSCOPIC ONLY
WBC, Ur, HPF, POC: NEGATIVE
Yeast, UA: NEGATIVE

## 2012-05-20 LAB — POCT URINALYSIS DIPSTICK
Bilirubin, UA: NEGATIVE
Glucose, UA: NEGATIVE
Leukocytes, UA: NEGATIVE
Nitrite, UA: NEGATIVE
pH, UA: 5

## 2012-05-20 LAB — HEPATIC FUNCTION PANEL
ALT: 18 U/L (ref 0–53)
AST: 20 U/L (ref 0–37)
Albumin: 4.8 g/dL (ref 3.5–5.2)
Total Protein: 6.8 g/dL (ref 6.0–8.3)

## 2012-05-20 NOTE — Patient Instructions (Addendum)
Get x-rays as planned Office will be calling you with when  and where to go

## 2012-05-20 NOTE — Progress Notes (Signed)
  Subjective:    Patient ID: [name] [name], male    DOB: 01/26/1941, 72 y.o.   MRN: [id number]  HPI [name] [name] presents today with abdominal pain right pelvic pain and right upper thigh pain of one month's duration. This pain seems to be worse at nighttime. There are times during the day when there is no pain at all. There is no associated GI complaints. There are no associated urinary tract complaints. He has noticed no blood in the urine. He has had some bright red blood in the stool. There is nothing that he can do to relieve the pain.   Review of Systems  Constitutional: Negative for fever.  Gastrointestinal: Positive for abdominal pain (lower right, radiating around to back).  Genitourinary: Positive for testicular pain (at times). Negative for dysuria and frequency.       Objective:   Physical Exam  Nursing note and vitals reviewed. Constitutional: He is oriented to person, place, and time. He appears well-developed and well-nourished.  HENT:  Head: Normocephalic and atraumatic.  Eyes: Conjunctivae are normal.  Neck: Neck supple.  Pulmonary/Chest: Effort normal.  Abdominal: Soft. He exhibits no distension and no mass. There is no tenderness. There is no rebound and no guarding.  No inguinal nodes  Genitourinary: Penis normal.  No hernia palpated  Musculoskeletal: Normal range of motion. He exhibits no edema.  Neurological: He is alert and oriented to person, place, and time.  Skin: Skin is warm and dry.  Psychiatric: He has a normal mood and affect. His behavior is normal.          Assessment & Plan:  Abdominal and pelvic pain, pain occasionally radiates to right back History of right renal stone  CBC with differential Complete urinalysis LFTs BM P. CT of abdomen and pelvis with contrast

## 2012-05-22 ENCOUNTER — Ambulatory Visit
Admission: RE | Admit: 2012-05-22 | Discharge: 2012-05-22 | Disposition: A | Discharge status: Home / Self Care | Payer: Medicare Other | Source: Ambulatory Visit | Attending: Family Medicine | Admitting: Family Medicine

## 2012-05-22 DIAGNOSIS — N200 Calculus of kidney: Secondary | ICD-10-CM | POA: Diagnosis not present

## 2012-05-22 DIAGNOSIS — M25551 Pain in right hip: Secondary | ICD-10-CM

## 2012-05-22 DIAGNOSIS — R109 Unspecified abdominal pain: Secondary | ICD-10-CM

## 2012-06-16 ENCOUNTER — Other Ambulatory Visit (INDEPENDENT_AMBULATORY_CARE_PROVIDER_SITE_OTHER): Payer: Medicare Other

## 2012-06-16 DIAGNOSIS — E785 Hyperlipidemia, unspecified: Secondary | ICD-10-CM | POA: Diagnosis not present

## 2012-06-16 DIAGNOSIS — E559 Vitamin D deficiency, unspecified: Secondary | ICD-10-CM | POA: Diagnosis not present

## 2012-06-16 DIAGNOSIS — R5383 Other fatigue: Secondary | ICD-10-CM

## 2012-06-16 DIAGNOSIS — R5381 Other malaise: Secondary | ICD-10-CM | POA: Diagnosis not present

## 2012-06-16 DIAGNOSIS — Z79899 Other long term (current) drug therapy: Secondary | ICD-10-CM

## 2012-06-16 LAB — POCT CBC
Granulocyte percent: 64.1 %G (ref 37–80)
HCT, POC: 39.2 % — AB (ref 43.5–53.7)
MCV: 88.2 fL (ref 80–97)
RBC: 4.5 M/uL — AB (ref 4.69–6.13)
RDW, POC: 15.8 %
WBC: 5.7 10*3/uL (ref 4.6–10.2)

## 2012-06-16 LAB — BASIC METABOLIC PANEL
BUN: 27 mg/dL — ABNORMAL HIGH (ref 6–23)
Chloride: 109 mEq/L (ref 96–112)
Glucose, Bld: 87 mg/dL (ref 70–99)
Potassium: 4.3 mEq/L (ref 3.5–5.3)

## 2012-06-16 LAB — HEPATIC FUNCTION PANEL
AST: 23 U/L (ref 0–37)
Albumin: 4.2 g/dL (ref 3.5–5.2)
Alkaline Phosphatase: 66 U/L (ref 39–117)
Bilirubin, Direct: 0.2 mg/dL (ref 0.0–0.3)
Total Bilirubin: 0.7 mg/dL (ref 0.3–1.2)

## 2012-06-17 LAB — NMR LIPOPROFILE WITH LIPIDS
HDL Size: 10 nm (ref >=9.2)
HDL-C: 54 mg/dL (ref >=40)
LDL Particle Number: 683 nmol/L (ref <1000)
LDL Size: 20.6 nm (ref >20.5)
Large HDL-P: 10 umol/L (ref >=4.8)

## 2012-06-22 ENCOUNTER — Encounter: Payer: Self-pay | Admitting: *Deleted

## 2012-06-23 ENCOUNTER — Other Ambulatory Visit: Payer: Self-pay | Admitting: Family Medicine

## 2012-06-23 ENCOUNTER — Encounter: Payer: Self-pay | Admitting: *Deleted

## 2012-06-24 ENCOUNTER — Other Ambulatory Visit (INDEPENDENT_AMBULATORY_CARE_PROVIDER_SITE_OTHER): Payer: Medicare Other

## 2012-06-24 DIAGNOSIS — R197 Diarrhea, unspecified: Secondary | ICD-10-CM

## 2012-06-25 LAB — FECAL OCCULT BLOOD, IMMUNOCHEMICAL: Fecal Occult Blood: NEGATIVE

## 2012-06-26 ENCOUNTER — Telehealth: Payer: Self-pay | Admitting: *Deleted

## 2012-06-26 NOTE — Telephone Encounter (Signed)
Message copied by Bearl Mulberry on Fri Jun 26, 2012 10:52 AM ------      Message from: Ernestina Penna      Created: Thu Jun 25, 2012  5:47 PM       FOBT was negative this time      Repeat 1 more FOBT in 4-6 weeks ------

## 2012-06-26 NOTE — Telephone Encounter (Signed)
Pt notified of result

## 2012-07-01 ENCOUNTER — Other Ambulatory Visit: Payer: Self-pay | Admitting: Cardiology

## 2012-07-01 NOTE — Telephone Encounter (Signed)
..   Requested Prescriptions   Pending Prescriptions Disp Refills  . metoprolol tartrate (LOPRESSOR) 25 MG tablet [Pharmacy Med Name: METOPROLOL 25MG  TABLET] 60 tablet 1    Sig: TAKE  (1)  TABLET TWICE A DAY.  Marland Kitchen.Patient needs to contact office to schedule  Appointment  for future refills.[phone]. Thank you.

## 2012-07-13 ENCOUNTER — Other Ambulatory Visit (INDEPENDENT_AMBULATORY_CARE_PROVIDER_SITE_OTHER): Payer: Medicare Other

## 2012-07-13 ENCOUNTER — Telehealth: Payer: Self-pay | Admitting: *Deleted

## 2012-07-13 DIAGNOSIS — IMO0001 Reserved for inherently not codable concepts without codable children: Secondary | ICD-10-CM

## 2012-07-13 DIAGNOSIS — M791 Myalgia, unspecified site: Secondary | ICD-10-CM

## 2012-07-13 LAB — BASIC METABOLIC PANEL WITH GFR
BUN: 25 mg/dL — ABNORMAL HIGH (ref 6–23)
Chloride: 109 mEq/L (ref 96–112)
GFR, Est African American: >89 mL/min
GFR, Est Non African American: >89 mL/min
Potassium: 4.5 mEq/L (ref 3.5–5.3)
Sodium: 141 mEq/L (ref 135–145)

## 2012-07-13 NOTE — Telephone Encounter (Signed)
Pt notified to come in for labs and BP check

## 2012-07-13 NOTE — Telephone Encounter (Signed)
Message copied by Bearl Mulberry on Mon Jul 13, 2012  2:52 PM ------      Message from: Ernestina Penna      Created: Mon Jul 13, 2012  2:34 PM       Get CK, hold statin drug, check electrolytes and blood pressure      ----- Message -----         From: April Yetta Numbers         Sent: 07/13/2012   2:21 PM           To: Ernestina Penna, MD            Mr [name] [name] feels his legs are weak and his knees hurt. He wants to know if he should discontinue his cholesterol meds.       ------

## 2012-07-14 ENCOUNTER — Ambulatory Visit (INDEPENDENT_AMBULATORY_CARE_PROVIDER_SITE_OTHER): Payer: Medicare Other | Admitting: Family Medicine

## 2012-07-14 ENCOUNTER — Encounter: Payer: Self-pay | Admitting: Family Medicine

## 2012-07-14 VITALS — BP 124/71 | HR 56 | Temp 98.6°F | Ht 72.0 in | Wt 209.0 lb

## 2012-07-14 DIAGNOSIS — M791 Myalgia, unspecified site: Secondary | ICD-10-CM

## 2012-07-14 DIAGNOSIS — R5381 Other malaise: Secondary | ICD-10-CM

## 2012-07-14 DIAGNOSIS — R609 Edema, unspecified: Secondary | ICD-10-CM | POA: Diagnosis not present

## 2012-07-14 DIAGNOSIS — IMO0001 Reserved for inherently not codable concepts without codable children: Secondary | ICD-10-CM

## 2012-07-14 DIAGNOSIS — R61 Generalized hyperhidrosis: Secondary | ICD-10-CM

## 2012-07-14 DIAGNOSIS — R5383 Other fatigue: Secondary | ICD-10-CM

## 2012-07-14 LAB — POCT URINALYSIS DIPSTICK
Bilirubin, UA: NEGATIVE
Glucose, UA: NEGATIVE
Nitrite, UA: NEGATIVE
Spec Grav, UA: >=1.03
pH, UA: 5

## 2012-07-14 LAB — POCT CBC
Granulocyte percent: 73.1 %G (ref 37–80)
Hemoglobin: 12.9 g/dL — AB (ref 14.1–18.1)
Lymph, poc: 1.7 (ref 0.6–3.4)
MPV: 8.2 fL (ref 0–99.8)
POC Granulocyte: 5.6 (ref 2–6.9)
POC LYMPH PERCENT: 22.3 %L (ref 10–50)
Platelet Count, POC: 141 10*3/uL — AB (ref 142–424)
RBC: 4.3 M/uL — AB (ref 4.69–6.13)

## 2012-07-14 LAB — POCT UA - MICROSCOPIC ONLY: Mucus, UA: NEGATIVE

## 2012-07-14 NOTE — Progress Notes (Addendum)
Subjective:    Patient ID: [name] [name], male    DOB: [birth year], 72 y.o.   MRN: [id number]  HPI Patient complains of fatigue and malaise and myalgias of lower extremities for several weeks. We just got back a slightly elevated CK. He has been told to leave off the Lipitor. There is no history of tick bite there is no fever or always had some night sweats.   Review of Systems  Constitutional: Negative for fever, chills, activity change, appetite change and unexpected weight change. Fatigue: increased in the last month.  HENT: Positive for congestion (as expected for this time of the year).   Eyes: Negative.   Respiratory: Negative.   Cardiovascular: Negative.   Gastrointestinal: Positive for nausea (slightly increased nausea for 2-3 weeks). Negative for vomiting.  Endocrine: Negative for polydipsia and polyuria.       Night sweats for several days recently.  Genitourinary: Negative for dysuria, urgency, frequency, hematuria and difficulty urinating.  Musculoskeletal: Positive for myalgias and joint swelling (L knee).  Neurological: Positive for weakness.       Objective:   Physical Exam  Vitals reviewed. Constitutional: He appears well-developed and well-nourished. No distress.  HENT:  Head: Normocephalic and atraumatic.  Right Ear: External ear normal.  Eyes: Left eye exhibits no discharge.  Neck: Neck supple. No thyromegaly present.  Cardiovascular: Normal rate, regular rhythm and normal heart sounds.   No murmur heard. Pulmonary/Chest: Effort normal and breath sounds normal. No respiratory distress. He has no wheezes.  Abdominal: Soft. Bowel sounds are normal. He exhibits no mass. There is no tenderness. There is no rebound.  Musculoskeletal: Normal range of motion. He exhibits no edema.  Lymphadenopathy:    He has no cervical adenopathy.  Neurological: He is alert.  Skin: Skin is warm and dry. No rash noted.  Psychiatric: He has a normal mood and affect. His behavior is  normal. Judgment and thought content normal.   Results for orders placed in visit on 07/14/12  POCT UA - MICROSCOPIC ONLY      Result Value Range   WBC, Ur, HPF, POC 1-3     RBC, urine, microscopic 1-5     Bacteria, U Microscopic few     Mucus, UA negative     Epithelial cells, urine per micros occ     Crystals, Ur, HPF, POC negative     Casts, Ur, LPF, POC negative     Yeast, UA negative    POCT URINALYSIS DIPSTICK      Result Value Range   Color, UA gold     Clarity, UA clear     Glucose, UA negative     Bilirubin, UA negative     Ketones, UA negative     Spec Grav, UA >=1.030     Blood, UA trace     pH, UA 5.0     Protein, UA 2+     Urobilinogen, UA negative     Nitrite, UA negative     Leukocytes, UA Negative    POCT CBC      Result Value Range   WBC 7.7  4.6 - 10.2 K/uL   Lymph, poc 1.7  0.6 - 3.4   POC LYMPH PERCENT 22.3  10 - 50 %L   POC Granulocyte 5.6  2 - 6.9   Granulocyte percent 73.1  37 - 80 %G   RBC 4.3 (*) 4.69 - 6.13 M/uL   Hemoglobin 12.9 (*) 14.1 - 18.1 g/dL   HCT, POC  38.3 (*) 43.5 - 53.7 %   MCV 88.5  80 - 97 fL   MCH, POC 29.7  27 - 31.2 pg   MCHC 33.6  31.8 - 35.4 g/dL   RDW, POC 47.8     Platelet Count, POC 141.0 (*) 142 - 424 K/uL   MPV 8.2  0 - 99.8 fL          Assessment & Plan:  1. Edema  2. Night sweats - CBC with Differential  3. Myalgia - Sedimentation rate - CBC with Differential - B. burgdorfi antibodies - Rocky mtn spotted fvr abs pnl(IgG+IgM)  4. Fatigue - POCT UA - Microscopic Only - POCT urinalysis dipstick - POCT CBC - Thyroid Panel With TSH - B. burgdorfi antibodies - Rocky mtn spotted fvr abs pnl(IgG+IgM)   Patient Instructions  Push fluids Do not take anymore Lipitor Will call you when lab results are returned Will need rectal exam and PSA in June

## 2012-07-14 NOTE — Patient Instructions (Addendum)
Push fluids Do not take anymore Lipitor Will call you when lab results are returned Will need rectal exam and PSA in June

## 2012-07-14 NOTE — Addendum Note (Signed)
Addended by: Bearl Mulberry on: 07/14/2012 03:52 PM   Modules accepted: Orders

## 2012-07-14 NOTE — Addendum Note (Signed)
Addended by: Orma Render F on: 07/14/2012 04:03 PM   Modules accepted: Orders

## 2012-07-15 LAB — ROCKY MTN SPOTTED FVR ABS PNL(IGG+IGM): RMSF IgG: 0.12 IV

## 2012-07-15 LAB — BASIC METABOLIC PANEL WITH GFR
BUN: 20 mg/dL (ref 6–23)
CO2: 27 mEq/L (ref 19–32)
Chloride: 106 mEq/L (ref 96–112)
Creat: 0.83 mg/dL (ref 0.50–1.35)
GFR, Est Non African American: >89 mL/min
Glucose, Bld: 87 mg/dL (ref 70–99)
Potassium: 4.4 mEq/L (ref 3.5–5.3)

## 2012-07-15 LAB — THYROID PANEL WITH TSH
Free Thyroxine Index: 2.7 (ref 1.0–3.9)
T3 Uptake: 39.9 % — ABNORMAL HIGH (ref 22.5–37.0)
T4, Total: 6.7 ug/dL (ref 5.0–12.5)

## 2012-07-15 LAB — SEDIMENTATION RATE: Sed Rate: 5 mm/hr (ref 0–16)

## 2012-07-16 ENCOUNTER — Telehealth: Payer: Self-pay | Admitting: Family Medicine

## 2012-07-22 ENCOUNTER — Telehealth: Payer: Self-pay | Admitting: Family Medicine

## 2012-07-23 ENCOUNTER — Other Ambulatory Visit: Payer: Self-pay | Admitting: Orthopedic Surgery

## 2012-07-23 ENCOUNTER — Ambulatory Visit (INDEPENDENT_AMBULATORY_CARE_PROVIDER_SITE_OTHER): Payer: Medicare Other

## 2012-07-23 DIAGNOSIS — M1710 Unilateral primary osteoarthritis, unspecified knee: Secondary | ICD-10-CM | POA: Diagnosis not present

## 2012-07-23 DIAGNOSIS — M25562 Pain in left knee: Secondary | ICD-10-CM

## 2012-07-23 DIAGNOSIS — M25569 Pain in unspecified knee: Secondary | ICD-10-CM

## 2012-07-23 NOTE — Telephone Encounter (Signed)
Spoke with patient in person about results.

## 2012-08-11 ENCOUNTER — Other Ambulatory Visit: Payer: Medicare Other

## 2012-08-11 ENCOUNTER — Encounter: Payer: Self-pay | Admitting: Family Medicine

## 2012-08-11 ENCOUNTER — Ambulatory Visit (INDEPENDENT_AMBULATORY_CARE_PROVIDER_SITE_OTHER): Payer: Medicare Other | Admitting: Family Medicine

## 2012-08-11 VITALS — BP 145/83 | HR 68 | Temp 98.1°F | Ht 72.0 in | Wt 203.6 lb

## 2012-08-11 DIAGNOSIS — I2510 Atherosclerotic heart disease of native coronary artery without angina pectoris: Secondary | ICD-10-CM

## 2012-08-11 DIAGNOSIS — Z1212 Encounter for screening for malignant neoplasm of rectum: Secondary | ICD-10-CM | POA: Diagnosis not present

## 2012-08-11 DIAGNOSIS — I10 Essential (primary) hypertension: Secondary | ICD-10-CM | POA: Diagnosis not present

## 2012-08-11 DIAGNOSIS — N400 Enlarged prostate without lower urinary tract symptoms: Secondary | ICD-10-CM | POA: Diagnosis not present

## 2012-08-11 DIAGNOSIS — E785 Hyperlipidemia, unspecified: Secondary | ICD-10-CM | POA: Diagnosis not present

## 2012-08-11 DIAGNOSIS — N529 Male erectile dysfunction, unspecified: Secondary | ICD-10-CM

## 2012-08-11 NOTE — Progress Notes (Signed)
  Subjective:    Patient ID: [name] [name], male    DOB: 08/12/1940, 72 y.o.   MRN: [id number]  HPI Patient comes in this morning for recheck of blood pressure and to check his prostate and get a PSA.   Review of Systems  Constitutional: Negative.   Respiratory: Negative.   Cardiovascular: Negative.   Gastrointestinal: Negative.   Endocrine: Negative.   Genitourinary: Negative.   Musculoskeletal: Positive for back pain (LBP) and arthralgias (leg pain some improvement since dc Lipitor, L knee pain improving).  Skin: Negative.   Allergic/Immunologic: Negative.   Neurological: Positive for headaches (occasional in the AM).  Psychiatric/Behavioral: Negative.        Objective:   Physical Exam  Nursing note and vitals reviewed. Constitutional: He is oriented to person, place, and time. He appears well-developed and well-nourished. No distress.  HENT:  Head: Normocephalic and atraumatic.  Eyes: Conjunctivae are normal. No scleral icterus.  Cardiovascular: Normal rate, regular rhythm, normal heart sounds and intact distal pulses.   Pedal pulses bilaterally were good  Pulmonary/Chest: Effort normal and breath sounds normal. No respiratory distress. He has no wheezes. He has no rales.  Abdominal: Soft. Bowel sounds are normal. He exhibits no mass. There is no tenderness. There is no rebound and no guarding.  Genitourinary: Rectum normal and penis normal. No penile tenderness.  Prostate slightly enlarged bilaterally. No lumps no masses. Rectal exam negative. There was no hernia palpated.  Musculoskeletal: Normal range of motion. He exhibits no edema.  Neurological: He is alert and oriented to person, place, and time. He has normal reflexes.  Skin: Skin is warm and dry.  Psychiatric: He has a normal mood and affect. His behavior is normal. Thought content normal.   Repeat blood pressure in the right arm was 160/90       Assessment & Plan:  1. HYPERTENSION Review blood pressures in 2  weeks to 4 week Watch sodium intake  2. Hyperlipidemia Recheck lipids the end of July Consider starting a statin drug like Livalo  3. CAD  4. BPH (benign prostatic hypertrophy)  - PSA  5. Erectile dysfunction Samples of Cialis given  Patient Instructions  Continue aggressive therapeutic lifestyle changes with diet and exercise Monitor blood pressure, bring readings for review in 4 week

## 2012-08-11 NOTE — Patient Instructions (Signed)
Continue aggressive therapeutic lifestyle changes with diet and exercise Monitor blood pressure, bring readings for review in 4 week

## 2012-08-11 NOTE — Progress Notes (Signed)
Patient dropped off fobt 

## 2012-08-12 ENCOUNTER — Telehealth: Payer: Self-pay | Admitting: *Deleted

## 2012-08-12 DIAGNOSIS — N400 Enlarged prostate without lower urinary tract symptoms: Secondary | ICD-10-CM

## 2012-08-12 DIAGNOSIS — D649 Anemia, unspecified: Secondary | ICD-10-CM

## 2012-08-12 NOTE — Telephone Encounter (Signed)
Message copied by Bearl Mulberry on Wed Aug 12, 2012  6:07 PM ------      Message from: Ernestina Penna      Created: Wed Aug 12, 2012  2:14 PM       FOBT this time is positive, one month ago it was negative      Pull Record and let us look at paper chart for last colonoscopy and endoscopy dates+++++++++++++IMP ------

## 2012-08-12 NOTE — Telephone Encounter (Signed)
Pt notified of results Per [facility] we need 2 NEG hemmocults in a row or he will need to see GI

## 2012-08-13 ENCOUNTER — Other Ambulatory Visit: Payer: Self-pay | Admitting: *Deleted

## 2012-08-13 DIAGNOSIS — E559 Vitamin D deficiency, unspecified: Secondary | ICD-10-CM

## 2012-08-13 DIAGNOSIS — M791 Myalgia, unspecified site: Secondary | ICD-10-CM

## 2012-08-13 DIAGNOSIS — E785 Hyperlipidemia, unspecified: Secondary | ICD-10-CM

## 2012-08-13 DIAGNOSIS — D649 Anemia, unspecified: Secondary | ICD-10-CM

## 2012-09-01 ENCOUNTER — Other Ambulatory Visit: Payer: Self-pay | Admitting: Cardiology

## 2012-09-01 ENCOUNTER — Telehealth: Payer: Self-pay | Admitting: Family Medicine

## 2012-09-01 NOTE — Telephone Encounter (Signed)
Pt notified he does need repeat CBC with FOBT

## 2012-09-03 ENCOUNTER — Other Ambulatory Visit (INDEPENDENT_AMBULATORY_CARE_PROVIDER_SITE_OTHER): Payer: Medicare Other

## 2012-09-03 ENCOUNTER — Telehealth: Payer: Self-pay | Admitting: *Deleted

## 2012-09-03 DIAGNOSIS — D649 Anemia, unspecified: Secondary | ICD-10-CM

## 2012-09-03 LAB — POCT CBC
Hemoglobin: 13.7 g/dL — AB (ref 14.1–18.1)
Lymph, poc: 2 (ref 0.6–3.4)
MCH, POC: 30.7 pg (ref 27–31.2)
MCHC: 34.2 g/dL (ref 31.8–35.4)
MPV: 7.5 fL (ref 0–99.8)
POC Granulocyte: 4 (ref 2–6.9)
Platelet Count, POC: 165 10*3/uL (ref 142–424)
RBC: 4.5 M/uL — AB (ref 4.69–6.13)

## 2012-09-03 NOTE — Progress Notes (Signed)
Pt came in for labs only and dropped off FOBT

## 2012-09-03 NOTE — Telephone Encounter (Signed)
Pt notified of results

## 2012-09-03 NOTE — Telephone Encounter (Signed)
Message copied by Bearl Mulberry on Thu Sep 03, 2012  5:28 PM ------      Message from: Ernestina Penna      Created: Thu Sep 03, 2012 12:52 PM       The CBC had a normal white blood cell count. The hemoglobin was improved from before at 13.7. Previously it was 12.9. The platelet count was adequate            ----We will recheck this again at some point in the future ------

## 2012-09-04 LAB — FECAL OCCULT BLOOD, IMMUNOCHEMICAL: Fecal Occult Blood: NEGATIVE

## 2012-09-24 ENCOUNTER — Ambulatory Visit: Payer: Self-pay | Admitting: Family Medicine

## 2012-09-24 ENCOUNTER — Other Ambulatory Visit: Payer: Self-pay | Admitting: Family Medicine

## 2012-09-24 ENCOUNTER — Other Ambulatory Visit (INDEPENDENT_AMBULATORY_CARE_PROVIDER_SITE_OTHER): Payer: Medicare Other

## 2012-09-24 DIAGNOSIS — IMO0001 Reserved for inherently not codable concepts without codable children: Secondary | ICD-10-CM | POA: Diagnosis not present

## 2012-09-24 DIAGNOSIS — E785 Hyperlipidemia, unspecified: Secondary | ICD-10-CM

## 2012-09-24 DIAGNOSIS — IMO0002 Reserved for concepts with insufficient information to code with codable children: Secondary | ICD-10-CM | POA: Diagnosis not present

## 2012-09-24 DIAGNOSIS — D649 Anemia, unspecified: Secondary | ICD-10-CM | POA: Diagnosis not present

## 2012-09-24 DIAGNOSIS — E559 Vitamin D deficiency, unspecified: Secondary | ICD-10-CM

## 2012-09-24 DIAGNOSIS — M791 Myalgia, unspecified site: Secondary | ICD-10-CM

## 2012-09-24 LAB — POCT CBC
Hemoglobin: 13.7 g/dL — AB (ref 14.1–18.1)
MCH, POC: 30.2 pg (ref 27–31.2)
MCHC: 33.6 g/dL (ref 31.8–35.4)
MCV: 89.9 fL (ref 80–97)
MPV: 7.7 fL (ref 0–99.8)
POC LYMPH PERCENT: 31.2 %L (ref 10–50)
RBC: 4.5 M/uL — AB (ref 4.69–6.13)

## 2012-09-24 NOTE — Progress Notes (Signed)
Patient here for labs only. 

## 2012-09-25 LAB — CREATININE KINASE MB
CK, MB: 4.6 ng/mL — ABNORMAL HIGH (ref 0.3–4.0)
Relative Index: 2.6 — ABNORMAL HIGH (ref 0.0–2.5)

## 2012-09-25 LAB — CK: Total CK: 180 U/L (ref 7–232)

## 2012-09-25 LAB — VITAMIN D 25 HYDROXY (VIT D DEFICIENCY, FRACTURES): Vit D, 25-Hydroxy: 85 ng/mL (ref 30–89)

## 2012-09-28 LAB — NMR LIPOPROFILE WITH LIPIDS
Cholesterol, Total: 202 mg/dL — ABNORMAL HIGH (ref <200)
HDL Particle Number: 27.5 umol/L — ABNORMAL LOW (ref >=30.5)
HDL-C: 47 mg/dL (ref >=40)
Large HDL-P: 8.1 umol/L (ref >=4.8)
Large VLDL-P: 1.1 nmol/L (ref <=2.7)
Triglycerides: 78 mg/dL (ref <150)

## 2012-10-01 ENCOUNTER — Telehealth: Payer: Self-pay | Admitting: Family Medicine

## 2012-10-01 ENCOUNTER — Ambulatory Visit (INDEPENDENT_AMBULATORY_CARE_PROVIDER_SITE_OTHER): Payer: Medicare Other | Admitting: Family Medicine

## 2012-10-01 ENCOUNTER — Encounter: Payer: Self-pay | Admitting: Family Medicine

## 2012-10-01 VITALS — BP 130/75 | HR 60 | Temp 97.8°F | Ht 72.0 in | Wt 206.8 lb

## 2012-10-01 DIAGNOSIS — N400 Enlarged prostate without lower urinary tract symptoms: Secondary | ICD-10-CM

## 2012-10-01 DIAGNOSIS — I2510 Atherosclerotic heart disease of native coronary artery without angina pectoris: Secondary | ICD-10-CM

## 2012-10-01 DIAGNOSIS — E559 Vitamin D deficiency, unspecified: Secondary | ICD-10-CM | POA: Diagnosis not present

## 2012-10-01 DIAGNOSIS — Z23 Encounter for immunization: Secondary | ICD-10-CM

## 2012-10-01 DIAGNOSIS — I10 Essential (primary) hypertension: Secondary | ICD-10-CM

## 2012-10-01 DIAGNOSIS — E538 Deficiency of other specified B group vitamins: Secondary | ICD-10-CM | POA: Diagnosis not present

## 2012-10-01 DIAGNOSIS — E785 Hyperlipidemia, unspecified: Secondary | ICD-10-CM | POA: Diagnosis not present

## 2012-10-01 DIAGNOSIS — N529 Male erectile dysfunction, unspecified: Secondary | ICD-10-CM

## 2012-10-01 DIAGNOSIS — J309 Allergic rhinitis, unspecified: Secondary | ICD-10-CM

## 2012-10-01 LAB — POCT UA - MICROSCOPIC ONLY
Casts, Ur, LPF, POC: NEGATIVE
Crystals, Ur, HPF, POC: NEGATIVE
Yeast, UA: NEGATIVE

## 2012-10-01 LAB — POCT URINALYSIS DIPSTICK
Ketones, UA: NEGATIVE
Leukocytes, UA: NEGATIVE
Nitrite, UA: NEGATIVE
Protein, UA: NEGATIVE
Urobilinogen, UA: NEGATIVE
pH, UA: 6

## 2012-10-01 NOTE — Addendum Note (Signed)
Addended by: Bearl Mulberry on: 10/01/2012 10:24 AM   Modules accepted: Orders

## 2012-10-01 NOTE — Progress Notes (Signed)
Subjective:    Patient ID: [name] [name], male    DOB: March 06, 1940, 72 y.o.   MRN: [id number]  HPI Patient comes in today for followup of chronic medical problems. These include ASCVD with history of CABG x4 in 2006, BPH, allergic rhinitis, hyperlipidemia, and vitamin D deficiency. He sees the cardiologist periodically for his ASCVD. He is due a rectal exam and PSA today. As far as health maintenance is concerned he is view of another pneumococcal vaccine shot as his previous one was done before the age of 32.   Review of Systems  Constitutional: Positive for fatigue.  HENT: Negative for ear pain, congestion, sore throat, sneezing, postnasal drip and sinus pressure.   Eyes: Negative for pain, discharge, redness, itching and visual disturbance.  Respiratory: Positive for cough. Negative for choking, chest tightness, shortness of breath and wheezing.   Cardiovascular: Positive for leg swelling. Negative for chest pain and palpitations.  Gastrointestinal: Positive for blood in stool (due to hemmorhoids). Negative for abdominal pain.  Endocrine: Negative.  Negative for cold intolerance, heat intolerance, polydipsia, polyphagia and polyuria.  Genitourinary: Positive for frequency (2-3 x nightly). Negative for dysuria.  Musculoskeletal: Positive for back pain (LBP), joint swelling (L knee) and arthralgias (bilateral knees).  Skin: Negative.  Negative for color change, pallor, rash and wound.  Allergic/Immunologic: Negative.  Negative for environmental allergies and food allergies.  Neurological: Negative for dizziness, tremors, syncope, weakness, light-headedness, numbness and headaches.  Hematological: Negative.   Psychiatric/Behavioral: Negative for confusion and sleep disturbance. The patient is not nervous/anxious.        Objective:   Physical Exam BP 130/75  Pulse 60  Temp(Src) 97.8 F (36.6 C) (Oral)  Ht 6' (1.829 m)  Wt 206 lb 12.8 oz (93.804 kg)  BMI 28.04 kg/m2  The patient  appeared well nourished and normally developed, alert and oriented to time and place. Speech, behavior and judgement appear normal. Vital signs as documented.  Head exam is unremarkable. No scleral icterus or pallor noted. There was some nasal congestion bilaterally. There was some slight thick yellow drainage on the posterior throat. Ears and ear canals were clear.  Neck is without jugular venous distension, thyromegally, or carotid bruits. Carotid upstrokes are brisk bilaterally. No cervical adenopathy. Lungs are clear anteriorly and posteriorly to auscultation. Normal respiratory effort. Cardiac exam reveals regular rate and rhythm at 72 per minute.. First and second heart sounds normal.  No murmurs, rubs or gallops.  Abdominal exam reveals normal bowl sounds, no masses, no organomegaly and no aortic enlargement. No inguinal adenopathy. There was slight epigastric tenderness. Rectal exam was done. There were no rectal masses. The prostate was enlarged but was smooth without lumps or masses. The external genitalia were normal and there was no hernia palpated. Extremities are nonedematous and both femoral and pedal pulses are normal. As of note both knees were slightly swollen but the left knee had more warmth to palpation than the right. Skin without pallor or jaundice.  Warm and dry, without rash. Neurologic exam reveals normal deep tendon reflexes and normal sensation.           Assessment & Plan:  1. BPH (benign prostatic hyperplasia) - POCT urinalysis dipstick - POCT UA - Microscopic Only  2. Hyperlipemia  3. Vitamin D deficiency  4. B12 deficiency  5. Allergic rhinitis  6. CAD  7. Hyperlipidemia  8. HYPERTENSION  9. ED (erectile dysfunction)  Patient Instructions  Fall precautions discussed Continue current meds and therapeutic lifestyle changes Recheck CK  in 4 weeks Try Livalo 2 mg daily samples given  Continue aggressive therapeutic lifestyle changes as your  joints will allow Patient will need a Pneumovax since he has not had one since turning 67 Try AYR nose spray 3 or 4 times daily and call her with warm salty water Sample of Cialis 20 given for patient to try--one half   Also repeat liver function tests with CK and 4 weeks  Nyra Capes MD

## 2012-10-01 NOTE — Patient Instructions (Addendum)
Fall precautions discussed Continue current meds and therapeutic lifestyle changes Recheck CK in 4 weeks Try Livalo 2 mg daily samples given  Continue aggressive therapeutic lifestyle changes as your joints will allow Patient will need a Pneumovax since he has not had one since turning 67 Try AYR nose spray 3 or 4 times daily and call her with warm salty water Sample of Cialis 20 given for patient to try--one half

## 2012-10-01 NOTE — Telephone Encounter (Signed)
Pt notified about Vit D level Can cut back on to taking on the week days only

## 2012-10-08 DIAGNOSIS — IMO0002 Reserved for concepts with insufficient information to code with codable children: Secondary | ICD-10-CM | POA: Diagnosis not present

## 2012-10-29 ENCOUNTER — Other Ambulatory Visit (INDEPENDENT_AMBULATORY_CARE_PROVIDER_SITE_OTHER): Payer: Medicare Other

## 2012-10-29 DIAGNOSIS — Z79899 Other long term (current) drug therapy: Secondary | ICD-10-CM

## 2012-10-30 LAB — HEPATIC FUNCTION PANEL
ALT: 17 IU/L (ref 0–44)
Bilirubin, Direct: 0.21 mg/dL (ref 0.00–0.40)
Total Protein: 6.5 g/dL (ref 6.0–8.5)

## 2012-12-03 ENCOUNTER — Ambulatory Visit (INDEPENDENT_AMBULATORY_CARE_PROVIDER_SITE_OTHER): Payer: Medicare Other | Admitting: *Deleted

## 2012-12-03 ENCOUNTER — Other Ambulatory Visit: Payer: Self-pay | Admitting: *Deleted

## 2012-12-03 DIAGNOSIS — Z23 Encounter for immunization: Secondary | ICD-10-CM

## 2012-12-03 DIAGNOSIS — M1710 Unilateral primary osteoarthritis, unspecified knee: Secondary | ICD-10-CM | POA: Diagnosis not present

## 2012-12-03 MED ORDER — PITAVASTATIN CALCIUM 4 MG PO TABS: 4.0000 mg | ORAL_TABLET | Freq: Every day | ORAL | Status: DC

## 2012-12-08 ENCOUNTER — Ambulatory Visit (INDEPENDENT_AMBULATORY_CARE_PROVIDER_SITE_OTHER): Payer: Medicare Other | Admitting: Cardiology

## 2012-12-08 ENCOUNTER — Encounter: Payer: Self-pay | Admitting: Cardiology

## 2012-12-08 VITALS — BP 145/78 | HR 54 | Ht 72.0 in | Wt 208.0 lb

## 2012-12-08 DIAGNOSIS — I2510 Atherosclerotic heart disease of native coronary artery without angina pectoris: Secondary | ICD-10-CM | POA: Diagnosis not present

## 2012-12-08 DIAGNOSIS — I10 Essential (primary) hypertension: Secondary | ICD-10-CM | POA: Diagnosis not present

## 2012-12-08 DIAGNOSIS — I4949 Other premature depolarization: Secondary | ICD-10-CM | POA: Diagnosis not present

## 2012-12-08 NOTE — Progress Notes (Signed)
HPI The patient presents for follow up of CAD.  Since I last saw him he has been doing well. He golfs 3 times per week.   However, he is having more problems with knee pain which has limited him to some degree. He was taken off Lipitor recently Pitavastatin.   , he's not sure that this helped joint pains that he was having.He  denies any new symptoms.  The patient denies any new symptoms such as chest discomfort, neck or arm discomfort. There has been no new shortness of breath, PND or orthopnea. There have been no reported palpitations, presyncope or syncope.    Allergies  Allergen Reactions  . Levofloxacin   . Crestor [Rosuvastatin]   . Vytorin [Ezetimibe-Simvastatin]     Current Outpatient Prescriptions  Medication Sig Dispense Refill  . aspirin 81 MG tablet Take 81-162 mg by mouth daily.        . Cholecalciferol (VITAMIN D3) 5000 UNITS CAPS Take 1 capsule by mouth daily.      . Coenzyme Q10 (COQ10) 100 MG CAPS Take 1 capsule by mouth 2 (two) times daily.        . fluticasone (FLONASE) 50 MCG/ACT nasal spray USE 2 SPRAYS IN EACH NOSTRIL ONCE DAILY.  16 g  2  . MELOXICAM PO Take by mouth as needed.      . metoprolol tartrate (LOPRESSOR) 25 MG tablet TAKE  (1)  TABLET TWICE A DAY.  60 tablet  0  . Misc Natural Products (OSTEO BI-FLEX ADV JOINT SHIELD PO) Take by mouth as directed.      . Multiple Vitamin (MULTI VITAMIN MENS PO) Take 1 tablet by mouth 2 (two) times daily.      . Omega-3 Fatty Acids (FISH OIL) 1000 MG CAPS Take 2 capsules by mouth daily. 1000 mg?       . Pitavastatin Calcium 4 MG TABS Take 2 mg by mouth daily.       No current facility-administered medications for this visit.    Past Medical History  Diagnosis Date  . CAD (coronary artery disease)   . Dyslipidemia   . Colon polyps   . Hiatal hernia   . Herniated disc   . HTN (hypertension)   . Hyperlipidemia     Past Surgical History  Procedure Laterality Date  . Mole removed  2003  . Right knee surgery   2008  . Tonsillectomy    . Coronary artery bypass graft  2006    LIMA to LAD, SVG to RCA, SVG to circumflex, SVG to diagonal     ROS:  Sinus trouble, right knee pain.  Epigastric discomfort.  Otherwise as stated in the HPI and negative for all other systems.  PHYSICAL EXAM BP 145/78  Pulse 54  Ht 6' (1.829 m)  Wt 208 lb (94.348 kg)  BMI 28.2 kg/m2 GENERAL:  Well appearing NECK:  No jugular venous distention, waveform within normal limits, carotid upstroke brisk and symmetric, no bruits, no thyromegaly LUNGS:  Clear to auscultation bilaterally BACK:  No CVA tenderness CHEST:  Well healed sternotomy scar. HEART:  PMI not displaced or sustained,S1 and S2 within normal limits, no S3, no S4, no clicks, no rubs, no murmurs ABD:  Flat, positive bowel sounds normal in frequency in pitch, no bruits, no rebound, no guarding, no midline pulsatile mass, no hepatomegaly, no splenomegaly EXT:  2 plus pulses throughout, no edema, no cyanosis no clubbing   EKG:  Sinus rhythm, rate 54, axis within normal limits,  intervals within normal limits, no acute ST-T wave changes.  12/08/2012   ASSESSMENT AND PLAN  CAD:  He has had no new symptoms since his ETT last year.   No further cardiovascular testing is uggested. He will continue the meds as listed.  HTN:  The BP is slightly elevated. However, this is uusual. No change in therapy is indicted.

## 2012-12-08 NOTE — Patient Instructions (Addendum)
The current medical regimen is effective;  continue present plan and medications.  Follow up in 1 year with Dr Hochrein.  You will receive a letter in the mail 2 months before you are due.  Please call us when you receive this letter to schedule your follow up appointment.  

## 2012-12-29 ENCOUNTER — Other Ambulatory Visit (INDEPENDENT_AMBULATORY_CARE_PROVIDER_SITE_OTHER): Payer: Medicare Other

## 2012-12-29 DIAGNOSIS — E785 Hyperlipidemia, unspecified: Secondary | ICD-10-CM

## 2012-12-29 DIAGNOSIS — E559 Vitamin D deficiency, unspecified: Secondary | ICD-10-CM | POA: Diagnosis not present

## 2012-12-29 DIAGNOSIS — D649 Anemia, unspecified: Secondary | ICD-10-CM

## 2012-12-29 DIAGNOSIS — IMO0001 Reserved for inherently not codable concepts without codable children: Secondary | ICD-10-CM | POA: Diagnosis not present

## 2012-12-29 DIAGNOSIS — M791 Myalgia, unspecified site: Secondary | ICD-10-CM

## 2012-12-29 LAB — POCT CBC
Granulocyte percent: 64.9 %G (ref 37–80)
Hemoglobin: 13.6 g/dL — AB (ref 14.1–18.1)
Lymph, poc: 1.5 (ref 0.6–3.4)
MCH, POC: 30.2 pg (ref 27–31.2)
MPV: 8.7 fL (ref 0–99.8)
POC Granulocyte: 3.1 (ref 2–6.9)
POC LYMPH PERCENT: 31.5 %L (ref 10–50)
Platelet Count, POC: 119 10*3/uL — AB (ref 142–424)
RBC: 4.5 M/uL — AB (ref 4.69–6.13)
RDW, POC: 14.7 %
WBC: 4.7 10*3/uL (ref 4.6–10.2)

## 2012-12-29 NOTE — Progress Notes (Signed)
Pt came in for labs only 

## 2012-12-31 ENCOUNTER — Ambulatory Visit: Payer: Medicare Other | Admitting: Family Medicine

## 2012-12-31 LAB — BMP8+EGFR
BUN: 18 mg/dL (ref 8–27)
CO2: 25 mmol/L (ref 18–29)
Calcium: 9.2 mg/dL (ref 8.6–10.2)
Creatinine, Ser: 0.85 mg/dL (ref 0.76–1.27)
Glucose: 96 mg/dL (ref 65–99)
Sodium: 144 mmol/L (ref 134–144)

## 2012-12-31 LAB — VITAMIN D 25 HYDROXY (VIT D DEFICIENCY, FRACTURES): Vit D, 25-Hydroxy: 58.8 ng/mL (ref 30.0–100.0)

## 2012-12-31 LAB — NMR, LIPOPROFILE
HDL Cholesterol by NMR: 54 mg/dL (ref >=40)
LDL Particle Number: 1125 nmol/L — ABNORMAL HIGH (ref <1000)
LDL Size: 21.2 nm (ref >20.5)
Small LDL Particle Number: 115 nmol/L (ref <=527)

## 2012-12-31 LAB — HEPATIC FUNCTION PANEL
Albumin: 4 g/dL (ref 3.5–4.8)
Alkaline Phosphatase: 58 IU/L (ref 39–117)
Bilirubin, Direct: 0.11 mg/dL (ref 0.00–0.40)
Total Protein: 6 g/dL (ref 6.0–8.5)

## 2013-01-07 DIAGNOSIS — H2510 Age-related nuclear cataract, unspecified eye: Secondary | ICD-10-CM | POA: Diagnosis not present

## 2013-01-07 DIAGNOSIS — M1710 Unilateral primary osteoarthritis, unspecified knee: Secondary | ICD-10-CM | POA: Diagnosis not present

## 2013-01-14 DIAGNOSIS — M1710 Unilateral primary osteoarthritis, unspecified knee: Secondary | ICD-10-CM | POA: Diagnosis not present

## 2013-01-19 DIAGNOSIS — M5137 Other intervertebral disc degeneration, lumbosacral region: Secondary | ICD-10-CM | POA: Diagnosis not present

## 2013-01-21 ENCOUNTER — Ambulatory Visit (INDEPENDENT_AMBULATORY_CARE_PROVIDER_SITE_OTHER): Payer: Medicare Other | Admitting: Family Medicine

## 2013-01-21 ENCOUNTER — Encounter: Payer: Self-pay | Admitting: Family Medicine

## 2013-01-21 VITALS — BP 120/75 | HR 62 | Temp 97.1°F | Ht 72.0 in | Wt 210.0 lb

## 2013-01-21 DIAGNOSIS — E785 Hyperlipidemia, unspecified: Secondary | ICD-10-CM | POA: Diagnosis not present

## 2013-01-21 DIAGNOSIS — I2510 Atherosclerotic heart disease of native coronary artery without angina pectoris: Secondary | ICD-10-CM

## 2013-01-21 DIAGNOSIS — I10 Essential (primary) hypertension: Secondary | ICD-10-CM

## 2013-01-21 DIAGNOSIS — N400 Enlarged prostate without lower urinary tract symptoms: Secondary | ICD-10-CM

## 2013-01-21 DIAGNOSIS — M791 Myalgia, unspecified site: Secondary | ICD-10-CM

## 2013-01-21 DIAGNOSIS — D696 Thrombocytopenia, unspecified: Secondary | ICD-10-CM

## 2013-01-21 DIAGNOSIS — M1710 Unilateral primary osteoarthritis, unspecified knee: Secondary | ICD-10-CM | POA: Diagnosis not present

## 2013-01-21 DIAGNOSIS — IMO0001 Reserved for inherently not codable concepts without codable children: Secondary | ICD-10-CM | POA: Diagnosis not present

## 2013-01-21 DIAGNOSIS — Z8052 Family history of malignant neoplasm of bladder: Secondary | ICD-10-CM

## 2013-01-21 LAB — POCT URINALYSIS DIPSTICK
Bilirubin, UA: NEGATIVE
Blood, UA: NEGATIVE
Ketones, UA: NEGATIVE
Leukocytes, UA: NEGATIVE
Protein, UA: NEGATIVE
Urobilinogen, UA: NEGATIVE

## 2013-01-21 LAB — POCT CBC
Granulocyte percent: 69.9 %G (ref 37–80)
Hemoglobin: 14.4 g/dL (ref 14.1–18.1)
Lymph, poc: 1.9 (ref 0.6–3.4)
MCH, POC: 29.4 pg (ref 27–31.2)
MCV: 91 fL (ref 80–97)
MPV: 8.1 fL (ref 0–99.8)
Platelet Count, POC: 148 10*3/uL (ref 142–424)
RBC: 4.9 M/uL (ref 4.69–6.13)
RDW, POC: 14.8 %
WBC: 6.9 10*3/uL (ref 4.6–10.2)

## 2013-01-21 LAB — POCT UA - MICROSCOPIC ONLY
Bacteria, U Microscopic: NEGATIVE
Casts, Ur, LPF, POC: NEGATIVE
Epithelial cells, urine per micros: NEGATIVE
Mucus, UA: NEGATIVE
WBC, Ur, HPF, POC: NEGATIVE
Yeast, UA: NEGATIVE

## 2013-01-21 NOTE — Progress Notes (Signed)
Subjective:    Patient ID: [name] [name], male    DOB: 1940/06/30, 72 y.o.   MRN: [id number]  HPI Pt here for follow up and management of chronic medical problems. He has been seeing the orthopedist recently and has been getting some injections in his knee. He is also going to see the orthopedics that deals with back problems. Dr. Cassandria Santee has recommended that he get some physical therapy to see if this will help loosen his back up some. He saw a cardiologist this past fall and he will not see him again until 1 year from then. He also had a recent eye exam and everything was good with his eyes.     Patient Active Problem List   Diagnosis Date Noted  . B12 deficiency 10/01/2012  . Allergic rhinitis 10/01/2012  . BPH (benign prostatic hyperplasia) 10/01/2012  . PREMATURE VENTRICULAR CONTRACTIONS, history of 09/01/2009  . COLONIC POLYPS 04/14/2009  . Hyperlipidemia 04/14/2009  . HYPERTENSION 04/14/2009  . CAD 04/14/2009  . HIATAL HERNIA 04/14/2009  . HERNIATED DISC 04/14/2009   Outpatient Encounter Prescriptions as of 01/21/2013  Medication Sig  . aspirin 81 MG tablet Take 81-162 mg by mouth daily.    . Cholecalciferol (VITAMIN D3) 5000 UNITS CAPS Take 1 capsule by mouth daily.  . Coenzyme Q10 (COQ10) 100 MG CAPS Take 1 capsule by mouth 2 (two) times daily.    . fluticasone (FLONASE) 50 MCG/ACT nasal spray USE 2 SPRAYS IN EACH NOSTRIL ONCE DAILY.  . MELOXICAM PO Take 15 mg by mouth daily as needed.   . metoprolol tartrate (LOPRESSOR) 25 MG tablet TAKE  (1)  TABLET TWICE A DAY.  Marland Kitchen Misc Natural Products (OSTEO BI-FLEX ADV JOINT SHIELD PO) Take by mouth as directed.  . Multiple Vitamin (MULTI VITAMIN MENS PO) Take 1 tablet by mouth 2 (two) times daily.  . Omega-3 Fatty Acids (FISH OIL) 1000 MG CAPS Take 2 capsules by mouth daily. 1000 mg?   . Pitavastatin Calcium 4 MG TABS Take 2 mg by mouth daily.    Review of Systems  Constitutional: Negative.   HENT: Negative.   Eyes: Negative.    Respiratory: Negative.   Cardiovascular: Negative.   Gastrointestinal: Negative.   Endocrine: Negative.   Genitourinary: Negative.   Musculoskeletal: Positive for arthralgias (knee pain- seen Dr Gerri Spore).  Skin: Negative.   Allergic/Immunologic: Negative.   Neurological: Negative.   Hematological: Negative.   Psychiatric/Behavioral: Negative.        Objective:   Physical Exam  Nursing note and vitals reviewed. Constitutional: He is oriented to person, place, and time. He appears well-developed and well-nourished. No distress.  HENT:  Head: Normocephalic and atraumatic.  Right Ear: External ear normal.  Left Ear: External ear normal.  Nose: Nose normal.  Mouth/Throat: Oropharynx is clear and moist. No oropharyngeal exudate.  Eyes: Conjunctivae and EOM are normal. Pupils are equal, round, and reactive to light. Right eye exhibits no discharge. Left eye exhibits no discharge. No scleral icterus.  Neck: Normal range of motion. Neck supple. No tracheal deviation present. No thyromegaly present.  Cardiovascular: Normal rate, regular rhythm, normal heart sounds and intact distal pulses.  Exam reveals no gallop and no friction rub.   No murmur heard. At 72 per minute  Pulmonary/Chest: Effort normal and breath sounds normal. No respiratory distress. He has no wheezes. He has no rales. He exhibits no tenderness.  No axillary adenopathy  Abdominal: Soft. Bowel sounds are normal. He exhibits no mass. There is no tenderness.  There is no rebound and no guarding.  No inguinal adenopathy  Musculoskeletal: Normal range of motion. He exhibits no edema and no tenderness.  Lymphadenopathy:    He has no cervical adenopathy.  Neurological: He is alert and oriented to person, place, and time. He has normal reflexes. No cranial nerve deficit.  Skin: Skin is warm and dry. No rash noted. No erythema. No pallor.  Atypical skin lesion right shoulder  Psychiatric: He has a normal mood and affect. His  behavior is normal. Judgment and thought content normal.   BP 120/75  Pulse 62  Temp(Src) 97.1 F (36.2 C) (Oral)  Ht 6' (1.829 m)  Wt 210 lb (95.255 kg)  BMI 28.47 kg/m2         Assessment & Plan:    1. Hyperlipidemia   2. BPH (benign prostatic hyperplasia)   3. HYPERTENSION   4. Family history of bladder cancer   5. CAD   6. Myalgia   7. Decreased platelet count    Orders Placed This Encounter  Procedures  . CK  . POCT urinalysis dipstick  . POCT UA - Microscopic Only  . POCT CBC   No orders of the defined types were placed in this encounter.   Patient Instructions  Continue current medications. Continue good therapeutic lifestyle changes which include good diet and exercise. Fall precautions discussed with patient. Schedule your flu vaccine if you haven't had it yet If you are over 86 years old - you may need Prevnar 13 or the adult Pneumonia vaccine. Based on information you should get a Prevnar shot in August of 2015 We will call you with the results of the CBC and CK blood tests that we will do today. The CBC is being done to followup on her platelet count. After reviewing the labs with you today that you had done in October, he should continue to livalo 2 mg daily Drink plenty of fluids Use a cool mist humidifier in her bedroom at night Be sure and call the dermatologist regarding the skin lesion on your right shoulder.   Nyra Capes MD

## 2013-01-21 NOTE — Patient Instructions (Addendum)
Continue current medications. Continue good therapeutic lifestyle changes which include good diet and exercise. Fall precautions discussed with patient. Schedule your flu vaccine if you haven't had it yet If you are over 70 years old - you may need Prevnar 13 or the adult Pneumonia vaccine. Based on information you should get a Prevnar shot in August of 2015 We will call you with the results of the CBC and CK blood tests that we will do today. The CBC is being done to followup on her platelet count. After reviewing the labs with you today that you had done in October, he should continue to livalo 2 mg daily Drink plenty of fluids Use a cool mist humidifier in her bedroom at night Be sure and call the dermatologist regarding the skin lesion on your right shoulder.

## 2013-01-27 ENCOUNTER — Encounter: Payer: Self-pay | Admitting: *Deleted

## 2013-01-27 ENCOUNTER — Ambulatory Visit: Payer: Medicare Other | Attending: Physical Medicine and Rehabilitation | Admitting: Physical Therapy

## 2013-01-27 DIAGNOSIS — M25569 Pain in unspecified knee: Secondary | ICD-10-CM | POA: Diagnosis not present

## 2013-01-27 DIAGNOSIS — IMO0001 Reserved for inherently not codable concepts without codable children: Secondary | ICD-10-CM | POA: Insufficient documentation

## 2013-01-27 DIAGNOSIS — M545 Low back pain, unspecified: Secondary | ICD-10-CM | POA: Insufficient documentation

## 2013-01-27 DIAGNOSIS — R5381 Other malaise: Secondary | ICD-10-CM | POA: Diagnosis not present

## 2013-01-27 NOTE — Progress Notes (Signed)
Quick Note:  Copy of labs sent to patient ______ 

## 2013-02-02 ENCOUNTER — Ambulatory Visit: Payer: Medicare Other | Attending: Physical Medicine and Rehabilitation | Admitting: Physical Therapy

## 2013-02-02 DIAGNOSIS — M25569 Pain in unspecified knee: Secondary | ICD-10-CM | POA: Insufficient documentation

## 2013-02-02 DIAGNOSIS — IMO0001 Reserved for inherently not codable concepts without codable children: Secondary | ICD-10-CM | POA: Insufficient documentation

## 2013-02-02 DIAGNOSIS — M545 Low back pain, unspecified: Secondary | ICD-10-CM | POA: Diagnosis not present

## 2013-02-02 DIAGNOSIS — R5381 Other malaise: Secondary | ICD-10-CM | POA: Insufficient documentation

## 2013-02-04 ENCOUNTER — Ambulatory Visit: Payer: Medicare Other | Admitting: Physical Therapy

## 2013-02-07 ENCOUNTER — Emergency Department (HOSPITAL_COMMUNITY): Payer: Medicare Other

## 2013-02-07 ENCOUNTER — Emergency Department (HOSPITAL_COMMUNITY)
Admission: EM | Admit: 2013-02-07 | Discharge: 2013-02-07 | Disposition: A | Discharge status: Home / Self Care | Payer: Medicare Other | Attending: Emergency Medicine | Admitting: Emergency Medicine

## 2013-02-07 ENCOUNTER — Encounter (HOSPITAL_COMMUNITY): Payer: Self-pay | Admitting: Emergency Medicine

## 2013-02-07 DIAGNOSIS — Z7982 Long term (current) use of aspirin: Secondary | ICD-10-CM | POA: Diagnosis not present

## 2013-02-07 DIAGNOSIS — N200 Calculus of kidney: Secondary | ICD-10-CM

## 2013-02-07 DIAGNOSIS — N23 Unspecified renal colic: Secondary | ICD-10-CM | POA: Insufficient documentation

## 2013-02-07 DIAGNOSIS — I2510 Atherosclerotic heart disease of native coronary artery without angina pectoris: Secondary | ICD-10-CM | POA: Insufficient documentation

## 2013-02-07 DIAGNOSIS — E785 Hyperlipidemia, unspecified: Secondary | ICD-10-CM | POA: Diagnosis not present

## 2013-02-07 DIAGNOSIS — N201 Calculus of ureter: Secondary | ICD-10-CM | POA: Diagnosis not present

## 2013-02-07 DIAGNOSIS — I10 Essential (primary) hypertension: Secondary | ICD-10-CM | POA: Diagnosis not present

## 2013-02-07 DIAGNOSIS — IMO0002 Reserved for concepts with insufficient information to code with codable children: Secondary | ICD-10-CM | POA: Insufficient documentation

## 2013-02-07 DIAGNOSIS — Z79899 Other long term (current) drug therapy: Secondary | ICD-10-CM | POA: Diagnosis not present

## 2013-02-07 LAB — CBC WITH DIFFERENTIAL/PLATELET
Basophils Relative: 0 % (ref 0–1)
Eosinophils Absolute: 0.1 10*3/uL (ref 0.0–0.7)
Eosinophils Relative: 2 % (ref 0–5)
Hemoglobin: 14.1 g/dL (ref 13.0–17.0)
MCH: 31.3 pg (ref 26.0–34.0)
MCHC: 34.3 g/dL (ref 30.0–36.0)
MCV: 91.1 fL (ref 78.0–100.0)
Monocytes Relative: 10 % (ref 3–12)
Neutro Abs: 3.5 10*3/uL (ref 1.7–7.7)
Neutrophils Relative %: 51 % (ref 43–77)
Platelets: 132 10*3/uL — ABNORMAL LOW (ref 150–400)
RBC: 4.51 MIL/uL (ref 4.22–5.81)
RDW: 14.1 % (ref 11.5–15.5)

## 2013-02-07 LAB — LIPASE, BLOOD: Lipase: 37 U/L (ref 11–59)

## 2013-02-07 LAB — URINE MICROSCOPIC-ADD ON

## 2013-02-07 LAB — COMPREHENSIVE METABOLIC PANEL
ALT: 52 U/L (ref 0–53)
Albumin: 4.3 g/dL (ref 3.5–5.2)
Alkaline Phosphatase: 66 U/L (ref 39–117)
BUN: 29 mg/dL — ABNORMAL HIGH (ref 6–23)
Potassium: 3.8 mEq/L (ref 3.5–5.1)
Sodium: 139 mEq/L (ref 135–145)
Total Protein: 7.4 g/dL (ref 6.0–8.3)

## 2013-02-07 LAB — URINALYSIS, ROUTINE W REFLEX MICROSCOPIC
Bilirubin Urine: NEGATIVE
Nitrite: NEGATIVE
Specific Gravity, Urine: 1.029 (ref 1.005–1.030)
pH: 6.5 (ref 5.0–8.0)

## 2013-02-07 MED ORDER — OXYCODONE-ACETAMINOPHEN 5-325 MG PO TABS: 1.0000 | ORAL_TABLET | Freq: Four times a day (QID) | ORAL | Status: DC | PRN

## 2013-02-07 MED ORDER — ONDANSETRON HCL 4 MG/2ML IJ SOLN
4.0000 mg | Freq: Once | INTRAMUSCULAR | Status: AC
  Administered 2013-02-07: 4 mg via INTRAVENOUS
  Filled 2013-02-07: qty 2

## 2013-02-07 MED ORDER — TAMSULOSIN HCL 0.4 MG PO CAPS: 0.4000 mg | ORAL_CAPSULE | Freq: Every day | ORAL | Status: DC

## 2013-02-07 MED ORDER — ONDANSETRON HCL 8 MG PO TABS: 8.0000 mg | ORAL_TABLET | Freq: Three times a day (TID) | ORAL | Status: DC | PRN

## 2013-02-07 MED ORDER — KETOROLAC TROMETHAMINE 30 MG/ML IJ SOLN
30.0000 mg | Freq: Once | INTRAMUSCULAR | Status: AC
  Administered 2013-02-07: 30 mg via INTRAVENOUS
  Filled 2013-02-07: qty 1

## 2013-02-07 MED ORDER — HYDROMORPHONE HCL PF 1 MG/ML IJ SOLN
1.0000 mg | Freq: Once | INTRAMUSCULAR | Status: AC
  Administered 2013-02-07: 1 mg via INTRAVENOUS
  Filled 2013-02-07: qty 1

## 2013-02-07 NOTE — ED Provider Notes (Signed)
CSN: [id number]     Arrival date & time 02/07/13  2033 History   First MD Initiated Contact with Patient 02/07/13 2037     Chief Complaint  Patient presents with  . Flank Pain   (Consider location/radiation/quality/duration/timing/severity/associated sxs/prior Treatment) Patient is a 72 y.o. male presenting with flank pain. The history is provided by the patient.  Flank Pain Pertinent negatives include no chest pain, no abdominal pain, no headaches and no shortness of breath.  pt c/o acute onset left flank pain posteriorly approximately 2 hrs ago. Constant, mod-severe, waxing and waning in intensity. Radiates towards left groin. No hematuria or dysuria. No flank or back strain or injury. No hx same pain. Remote hx incidental note of kidney stone on prior ct, no prior kidney stone related pain. Denies fever or chills. Prior to acute onset of this pain, felt in normal, baseline, well state of health.      Past Medical History  Diagnosis Date  . CAD (coronary artery disease)   . Dyslipidemia   . Colon polyps   . Hiatal hernia   . Herniated disc   . HTN (hypertension)   . Hyperlipidemia    Past Surgical History  Procedure Laterality Date  . Mole removed  2003  . Right knee surgery  2008  . Tonsillectomy    . Coronary artery bypass graft  2006    LIMA to LAD, SVG to RCA, SVG to circumflex, SVG to diagonal    No family history on file. History  Substance Use Topics  . Smoking status: Never Smoker   . Smokeless tobacco: Not on file     Comment: does not smoke   . Alcohol Use: No    Review of Systems  Constitutional: Negative for fever.  HENT: Negative for sore throat.   Eyes: Negative for redness.  Respiratory: Negative for shortness of breath.   Cardiovascular: Negative for chest pain.  Gastrointestinal: Positive for nausea. Negative for vomiting and abdominal pain.  Genitourinary: Positive for flank pain. Negative for dysuria and hematuria.  Musculoskeletal: Negative for  back pain and neck pain.  Skin: Negative for rash.  Neurological: Negative for headaches.  Hematological: Does not bruise/bleed easily.  Psychiatric/Behavioral: Negative for confusion.    Allergies  Levofloxacin; Crestor; and Vytorin  Home Medications   Current Outpatient Rx  Name  Route  Sig  Dispense  Refill  . aspirin 81 MG tablet   Oral   Take 81-162 mg by mouth daily.           . Cholecalciferol (VITAMIN D3) 5000 UNITS CAPS   Oral   Take 1 capsule by mouth daily.         . Coenzyme Q10 (COQ10) 100 MG CAPS   Oral   Take 1 capsule by mouth 2 (two) times daily.           . fluticasone (FLONASE) 50 MCG/ACT nasal spray      USE 2 SPRAYS IN EACH NOSTRIL ONCE DAILY.   16 g   2   . MELOXICAM PO   Oral   Take 15 mg by mouth daily as needed.          . metoprolol tartrate (LOPRESSOR) 25 MG tablet      TAKE  (1)  TABLET TWICE A DAY.   60 tablet   0     .Marland KitchenPatient needs to contact office to schedule  App ...   . Multiple Vitamin (MULTI VITAMIN MENS PO)   Oral  Take 1 tablet by mouth 2 (two) times daily.         . naproxen sodium (ANAPROX) 220 MG tablet   Oral   Take 220 mg by mouth 2 (two) times daily with a meal.         . Omega-3 Fatty Acids (FISH OIL) 1000 MG CAPS   Oral   Take 2 capsules by mouth daily. 1000 mg?          . Pitavastatin Calcium 4 MG TABS   Oral   Take 2 mg by mouth daily.         . Misc Natural Products (OSTEO BI-FLEX ADV JOINT SHIELD PO)   Oral   Take by mouth as directed.          BP 193/71  Pulse 69  Temp(Src) 97.4 F (36.3 C) (Oral)  Resp 24  SpO2 100% Physical Exam  Nursing note and vitals reviewed. Constitutional: He is oriented to person, place, and time. He appears well-developed and well-nourished.  HENT:  Head: Atraumatic.  Eyes: Conjunctivae are normal.  Neck: Neck supple. No tracheal deviation present.  Cardiovascular: Normal rate, regular rhythm, normal heart sounds and intact distal pulses.    Pulmonary/Chest: Effort normal and breath sounds normal. No accessory muscle usage. No respiratory distress.  Abdominal: Soft. Bowel sounds are normal. He exhibits no distension and no mass. There is no tenderness. There is no rebound and no guarding.  Genitourinary:  Normal ext genitalia. No testicular pain, swelling or tenderness. No cva tenderness.   Musculoskeletal: Normal range of motion. He exhibits no edema and no tenderness.  Neurological: He is alert and oriented to person, place, and time.  Skin: Skin is warm and dry. He is not diaphoretic.  Psychiatric: He has a normal mood and affect.    ED Course  Procedures (including critical care time)  Results for orders placed during the hospital encounter of 02/07/13  CBC WITH DIFFERENTIAL      Result Value Range   WBC 6.8  4.0 - 10.5 K/uL   RBC 4.51  4.22 - 5.81 MIL/uL   Hemoglobin 14.1  13.0 - 17.0 g/dL   HCT 16.1  09.6 - 04.5 %   MCV 91.1  78.0 - 100.0 fL   MCH 31.3  26.0 - 34.0 pg   MCHC 34.3  30.0 - 36.0 g/dL   RDW 40.9  81.1 - 91.4 %   Platelets 132 (*) 150 - 400 K/uL   Neutrophils Relative % 51  43 - 77 %   Neutro Abs 3.5  1.7 - 7.7 K/uL   Lymphocytes Relative 36  12 - 46 %   Lymphs Abs 2.5  0.7 - 4.0 K/uL   Monocytes Relative 10  3 - 12 %   Monocytes Absolute 0.7  0.1 - 1.0 K/uL   Eosinophils Relative 2  0 - 5 %   Eosinophils Absolute 0.1  0.0 - 0.7 K/uL   Basophils Relative 0  0 - 1 %   Basophils Absolute 0.0  0.0 - 0.1 K/uL  COMPREHENSIVE METABOLIC PANEL      Result Value Range   Sodium 139  135 - 145 mEq/L   Potassium 3.8  3.5 - 5.1 mEq/L   Chloride 103  96 - 112 mEq/L   CO2 23  19 - 32 mEq/L   Glucose, Bld 109 (*) 70 - 99 mg/dL   BUN 29 (*) 6 - 23 mg/dL   Creatinine, Ser 7.82  0.50 - 1.35  mg/dL   Calcium 9.3  8.4 - 14.7 mg/dL   Total Protein 7.4  6.0 - 8.3 g/dL   Albumin 4.3  3.5 - 5.2 g/dL   AST 42 (*) 0 - 37 U/L   ALT 52  0 - 53 U/L   Alkaline Phosphatase 66  39 - 117 U/L   Total Bilirubin 0.3  0.3  - 1.2 mg/dL   GFR calc non Af Amer 64 (*) >90 mL/min   GFR calc Af Amer 74 (*) >90 mL/min  LIPASE, BLOOD      Result Value Range   Lipase 37  11 - 59 U/L  URINALYSIS, ROUTINE W REFLEX MICROSCOPIC      Result Value Range   Color, Urine YELLOW  YELLOW   APPearance CLOUDY (*) CLEAR   Specific Gravity, Urine 1.029  1.005 - 1.030   pH 6.5  5.0 - 8.0   Glucose, UA NEGATIVE  NEGATIVE mg/dL   Hgb urine dipstick SMALL (*) NEGATIVE   Bilirubin Urine NEGATIVE  NEGATIVE   Ketones, ur 15 (*) NEGATIVE mg/dL   Protein, ur NEGATIVE  NEGATIVE mg/dL   Urobilinogen, UA 0.2  0.0 - 1.0 mg/dL   Nitrite NEGATIVE  NEGATIVE   Leukocytes, UA NEGATIVE  NEGATIVE  URINE MICROSCOPIC-ADD ON      Result Value Range   Squamous Epithelial / LPF FEW (*) RARE   WBC, UA 0-2  <3 WBC/hpf   RBC / HPF 11-20  <3 RBC/hpf   Bacteria, UA FEW (*) RARE   Urine-Other MUCOUS PRESENT     Ct Abdomen Pelvis Wo Contrast  02/07/2013   CLINICAL DATA:  Left flank and lower abdominal pain.  EXAM: CT ABDOMEN AND PELVIS WITHOUT CONTRAST  TECHNIQUE: Multidetector CT imaging of the abdomen and pelvis was performed following the standard protocol without intravenous contrast.  COMPARISON:  05/22/2012.  FINDINGS: 4 mm calculus in the urinary bladder on the left at the ureterovesical junction. Mild to moderate dilatation of the left renal collecting system and ureter to the level of the ureterovesical junction. There is also proximal left periureteric fluid and mild left perinephric fluid. The left kidney is larger than on the previous examination and larger than the right kidney. There is also a 4 mm calculus in the mid to lower left kidney. No right renal or right ureteral calculi or hydronephrosis. Previously demonstrated bilateral parapelvic renal cysts, remaining larger on the left compared to the right.  Bilateral inguinal hernias containing fat. No gastrointestinal abnormalities or enlarged lymph nodes. Normal appearing appendix.  Unremarkable  non contrasted appearance of the liver, spleen, pancreas, gallbladder and adrenal glands. Minimally enlarged prostate gland containing small calcifications. Mild dependent atelectasis at both lung bases. Lumbar and lower thoracic spine degenerative changes.  IMPRESSION: 1. 4 mm distal left ureteral calculus at the ureterovesical junction, causing mild to moderate left hydronephrosis and hydroureter. 2. 4 mm nonobstructing left renal calculus. 3. Bilateral parapelvic renal cysts, larger on the left. 4. Bilateral inguinal hernias containing fat.   Electronically Signed   By: Gordan Payment M.D.   On: 02/07/2013 22:15     EKG Interpretation   None       MDM  Iv ns.   toradol iv. Dilaudid iv. zofran iv.  Labs.  Ct.  Reviewed nursing notes and prior charts for additional history.   Discussed ct w pt/spouse.  Recheck no pain.   abd soft nt. Pt stable for d/c.    Suzi Roots, MD 02/09/13 [phone]

## 2013-02-07 NOTE — ED Notes (Signed)
The pt is c/o lt flank and lt lower abd pain since 1700.  No bloody urine no difficulty voiding

## 2013-02-09 ENCOUNTER — Ambulatory Visit: Payer: Medicare Other | Admitting: Physical Therapy

## 2013-02-11 ENCOUNTER — Ambulatory Visit: Payer: Medicare Other | Admitting: Physical Therapy

## 2013-02-15 ENCOUNTER — Telehealth: Payer: Self-pay | Admitting: Family Medicine

## 2013-02-15 MED ORDER — OSELTAMIVIR PHOSPHATE 75 MG PO CAPS: 75.0000 mg | ORAL_CAPSULE | Freq: Two times a day (BID) | ORAL | Status: DC

## 2013-02-15 NOTE — Telephone Encounter (Signed)
Flu like symptoms - fever, aches, pains all over, chest cold, some wheezing Wife positive A flu on saturday

## 2013-02-16 ENCOUNTER — Other Ambulatory Visit: Payer: Self-pay | Admitting: Dermatology

## 2013-02-16 ENCOUNTER — Ambulatory Visit: Payer: Medicare Other | Admitting: *Deleted

## 2013-02-16 DIAGNOSIS — L821 Other seborrheic keratosis: Secondary | ICD-10-CM | POA: Diagnosis not present

## 2013-02-16 DIAGNOSIS — D485 Neoplasm of uncertain behavior of skin: Secondary | ICD-10-CM | POA: Diagnosis not present

## 2013-02-18 DIAGNOSIS — M79609 Pain in unspecified limb: Secondary | ICD-10-CM | POA: Diagnosis not present

## 2013-02-18 DIAGNOSIS — M5137 Other intervertebral disc degeneration, lumbosacral region: Secondary | ICD-10-CM | POA: Diagnosis not present

## 2013-02-18 DIAGNOSIS — M1710 Unilateral primary osteoarthritis, unspecified knee: Secondary | ICD-10-CM | POA: Diagnosis not present

## 2013-03-10 DIAGNOSIS — M545 Low back pain, unspecified: Secondary | ICD-10-CM | POA: Diagnosis not present

## 2013-03-10 DIAGNOSIS — M5137 Other intervertebral disc degeneration, lumbosacral region: Secondary | ICD-10-CM | POA: Diagnosis not present

## 2013-03-23 DIAGNOSIS — M545 Low back pain, unspecified: Secondary | ICD-10-CM | POA: Diagnosis not present

## 2013-03-23 DIAGNOSIS — N200 Calculus of kidney: Secondary | ICD-10-CM | POA: Diagnosis not present

## 2013-03-23 DIAGNOSIS — R109 Unspecified abdominal pain: Secondary | ICD-10-CM | POA: Diagnosis not present

## 2013-03-29 ENCOUNTER — Other Ambulatory Visit: Payer: Self-pay | Admitting: Cardiology

## 2013-04-27 DIAGNOSIS — M545 Low back pain, unspecified: Secondary | ICD-10-CM | POA: Diagnosis not present

## 2013-05-04 ENCOUNTER — Other Ambulatory Visit (INDEPENDENT_AMBULATORY_CARE_PROVIDER_SITE_OTHER): Payer: Medicare Other

## 2013-05-04 DIAGNOSIS — M545 Low back pain, unspecified: Secondary | ICD-10-CM | POA: Diagnosis not present

## 2013-05-04 DIAGNOSIS — E785 Hyperlipidemia, unspecified: Secondary | ICD-10-CM | POA: Diagnosis not present

## 2013-05-04 DIAGNOSIS — IMO0001 Reserved for inherently not codable concepts without codable children: Secondary | ICD-10-CM | POA: Diagnosis not present

## 2013-05-04 DIAGNOSIS — M791 Myalgia, unspecified site: Secondary | ICD-10-CM

## 2013-05-04 DIAGNOSIS — E559 Vitamin D deficiency, unspecified: Secondary | ICD-10-CM | POA: Diagnosis not present

## 2013-05-04 LAB — BASIC METABOLIC PANEL WITH GFR
BUN: 18 mg/dL (ref 6–23)
CO2: 28 mEq/L (ref 19–32)
Calcium: 9.8 mg/dL (ref 8.4–10.5)
Chloride: 106 mEq/L (ref 96–112)
Creat: 0.81 mg/dL (ref 0.50–1.35)
Glucose, Bld: 99 mg/dL (ref 70–99)
Potassium: 4.1 mEq/L (ref 3.5–5.3)
Sodium: 143 mEq/L (ref 135–145)

## 2013-05-04 NOTE — Progress Notes (Signed)
Pt came in for labs only 

## 2013-05-05 LAB — NMR LIPOPROFILE WITH LIPIDS
Cholesterol, Total: 160 mg/dL (ref <200)
HDL PARTICLE NUMBER: 34.2 umol/L (ref >=30.5)
HDL SIZE: 9.1 nm — AB (ref >=9.2)
HDL-C: 55 mg/dL (ref >=40)
LDL (calc): 87 mg/dL (ref <100)
LDL Particle Number: 1529 nmol/L — ABNORMAL HIGH (ref <1000)
LDL Size: 21 nm (ref >20.5)
LP-IR Score: <25 (ref <=45)
Large HDL-P: 7.3 umol/L (ref >=4.8)
Small LDL Particle Number: 794 nmol/L — ABNORMAL HIGH (ref <=527)
Triglycerides: 92 mg/dL (ref <150)
VLDL Size: 41.5 nm (ref <=46.6)

## 2013-05-05 LAB — VITAMIN D 25 HYDROXY (VIT D DEFICIENCY, FRACTURES): Vit D, 25-Hydroxy: 82 ng/mL (ref 30–89)

## 2013-05-11 ENCOUNTER — Ambulatory Visit (INDEPENDENT_AMBULATORY_CARE_PROVIDER_SITE_OTHER): Payer: Medicare Other | Admitting: Family Medicine

## 2013-05-11 ENCOUNTER — Encounter: Payer: Self-pay | Admitting: Family Medicine

## 2013-05-11 VITALS — BP 129/78 | HR 59 | Temp 97.1°F | Ht 72.0 in | Wt 209.0 lb

## 2013-05-11 DIAGNOSIS — I2510 Atherosclerotic heart disease of native coronary artery without angina pectoris: Secondary | ICD-10-CM

## 2013-05-11 DIAGNOSIS — N400 Enlarged prostate without lower urinary tract symptoms: Secondary | ICD-10-CM

## 2013-05-11 DIAGNOSIS — E785 Hyperlipidemia, unspecified: Secondary | ICD-10-CM | POA: Diagnosis not present

## 2013-05-11 DIAGNOSIS — I10 Essential (primary) hypertension: Secondary | ICD-10-CM

## 2013-05-11 DIAGNOSIS — M79609 Pain in unspecified limb: Secondary | ICD-10-CM | POA: Diagnosis not present

## 2013-05-11 DIAGNOSIS — M545 Low back pain, unspecified: Secondary | ICD-10-CM | POA: Diagnosis not present

## 2013-05-11 DIAGNOSIS — IMO0002 Reserved for concepts with insufficient information to code with codable children: Secondary | ICD-10-CM | POA: Diagnosis not present

## 2013-05-11 DIAGNOSIS — Z79899 Other long term (current) drug therapy: Secondary | ICD-10-CM

## 2013-05-11 DIAGNOSIS — M5137 Other intervertebral disc degeneration, lumbosacral region: Secondary | ICD-10-CM | POA: Diagnosis not present

## 2013-05-11 NOTE — Progress Notes (Signed)
Subjective:    Patient ID: [name], male    DOB: Feb 05, 1941, 73 y.o.   MRN: [id number]  HPI Pt here for follow up and management of chronic medical problems. Patient was like to retry the Lipitor because his leg pain he thinks is coming from his back and not from the Lipitor and the current statin drug is causing too much money. He had brought some blood pressures in 4 reviewed recently and these review readings were good. His recent labwork will be reviewed with him today        Patient Active Problem List   Diagnosis Date Noted  . B12 deficiency 10/01/2012  . Allergic rhinitis 10/01/2012  . BPH (benign prostatic hyperplasia) 10/01/2012  . PREMATURE VENTRICULAR CONTRACTIONS, history of 09/01/2009  . COLONIC POLYPS 04/14/2009  . Hyperlipidemia 04/14/2009  . HYPERTENSION 04/14/2009  . CAD 04/14/2009  . HIATAL HERNIA 04/14/2009  . HERNIATED DISC 04/14/2009   Outpatient Encounter Prescriptions as of 05/11/2013  Medication Sig  . aspirin 81 MG tablet Take 81-162 mg by mouth daily.    . Cholecalciferol (VITAMIN D3) 5000 UNITS CAPS Take 1 capsule by mouth daily.  . Coenzyme Q10 (COQ10) 100 MG CAPS Take 1 capsule by mouth 2 (two) times daily.    . fluticasone (FLONASE) 50 MCG/ACT nasal spray USE 2 SPRAYS IN EACH NOSTRIL ONCE DAILY.  . MELOXICAM PO Take 15 mg by mouth daily as needed.   . metoprolol tartrate (LOPRESSOR) 25 MG tablet TAKE  (1)  TABLET TWICE A DAY.  Marland Kitchen Misc Natural Products (OSTEO BI-FLEX ADV JOINT SHIELD PO) Take by mouth as directed.  . Multiple Vitamin (MULTI VITAMIN MENS PO) Take 1 tablet by mouth 2 (two) times daily.  . naproxen sodium (ANAPROX) 220 MG tablet Take 220 mg by mouth 2 (two) times daily with a meal.  . Omega-3 Fatty Acids (FISH OIL) 1000 MG CAPS Take 2 capsules by mouth daily. 1000 mg?   . oxyCODONE-acetaminophen (PERCOCET/ROXICET) 5-325 MG per tablet Take 1-2 tablets by mouth every 6 (six) hours as needed for severe pain.  . Pitavastatin  Calcium 4 MG TABS Take 2 mg by mouth daily.  . tamsulosin (FLOMAX) 0.4 MG CAPS capsule Take 1 capsule (0.4 mg total) by mouth daily.  . [DISCONTINUED] ondansetron (ZOFRAN) 8 MG tablet Take 1 tablet (8 mg total) by mouth every 8 (eight) hours as needed for nausea or vomiting.  . [DISCONTINUED] oseltamivir (TAMIFLU) 75 MG capsule Take 1 capsule (75 mg total) by mouth 2 (two) times daily.    Review of Systems  Constitutional: Negative.   HENT: Negative.   Eyes: Negative.   Respiratory: Negative.   Cardiovascular: Negative.   Gastrointestinal: Negative.   Endocrine: Negative.   Genitourinary: Negative.   Musculoskeletal: Negative.   Skin: Negative.   Allergic/Immunologic: Negative.   Neurological: Negative.   Hematological: Negative.   Psychiatric/Behavioral: Negative.        Objective:   Physical Exam  Nursing note and vitals reviewed. Constitutional: He is oriented to person, place, and time. He appears well-developed and well-nourished. No distress.  HENT:  Head: Normocephalic and atraumatic.  Right Ear: External ear normal.  Left Ear: External ear normal.  Nose: Nose normal.  Mouth/Throat: Oropharynx is clear and moist. No oropharyngeal exudate.  Eyes: Conjunctivae and EOM are normal. Pupils are equal, round, and reactive to light. Right eye exhibits no discharge. Left eye exhibits no discharge. No scleral icterus.  Neck: Normal range of motion. Neck supple. No  thyromegaly present.  Cardiovascular: Normal rate, regular rhythm, normal heart sounds and intact distal pulses.  Exam reveals no gallop and no friction rub.   No murmur heard. Pulmonary/Chest: Effort normal and breath sounds normal. He has no wheezes. He has no rales.  Abdominal: Soft. Bowel sounds are normal. He exhibits no mass. There is no tenderness. There is no rebound and no guarding.  Musculoskeletal: Normal range of motion. He exhibits no edema and no tenderness.  Lymphadenopathy:    He has no cervical  adenopathy.  Neurological: He is alert and oriented to person, place, and time. He has normal reflexes. No cranial nerve deficit.  Skin: Skin is warm and dry. No rash noted. No erythema. No pallor.  Psychiatric: He has a normal mood and affect. His behavior is normal. Judgment and thought content normal.   BP 129/78  Pulse 59  Temp(Src) 97.1 F (36.2 C) (Oral)  Ht 6' (1.829 m)  Wt 209 lb (94.802 kg)  BMI 28.34 kg/m2        Assessment & Plan:  1. BPH (benign prostatic hyperplasia)  2. CAD  3. Hyperlipidemia - Hepatic function panel - CK total and CKMB (cardiac)  4. HYPERTENSION  5. High risk medication use - Hepatic function panel - CK total and CKMB (cardiac)  Patient Instructions                       Medicare Annual Wellness Visit  [facility] and the medical providers at [name] strive to bring you the best medical care.  In doing so we not only want to address your current medical conditions and concerns but also to detect new conditions early and prevent illness, disease and health-related problems.    Medicare offers a yearly Wellness Visit which allows our clinical staff to assess your need for preventative services including immunizations, lifestyle education, counseling to decrease risk of preventable diseases and screening for fall risk and other medical concerns.    This visit is provided free of charge (no copay) for all Medicare recipients. The clinical pharmacists at [facility] have begun to conduct these Wellness Visits which will also include a thorough review of all your medications.    As you primary medical provider recommend that you make an appointment for your Annual Wellness Visit if you have not done so already this year.  You may set up this appointment before you leave today or you may call back ([phone]) and schedule an appointment.  Please make sure when you call that you mention that you are  scheduling your Annual Wellness Visit with the clinical pharmacist so that the appointment may be made for the proper length of time.     Continue current medications. Continue good therapeutic lifestyle changes which include good diet and exercise. Fall precautions discussed with patient. If an FOBT was given today- please return it to our front desk. If you are over 38 years old - you may need Prevnar 32 or the adult Pneumonia vaccine.  Present the Lipitor 10 mg one daily Have lab work checked in 4 weeks If lab work is no worse and there is no increased aching in her legs increase her Lipitor back to 20 mg daily Continue to monitor blood pressures at home Hold off on the Advil for a couple weeks and see if that is affecting blood pressure Drink more water   Arrie Senate MD

## 2013-05-11 NOTE — Patient Instructions (Addendum)
Medicare Annual Wellness Visit  [facility] and the medical providers at [facility] strive to bring you the best medical care.  In doing so we not only want to address your current medical conditions and concerns but also to detect new conditions early and prevent illness, disease and health-related problems.    Medicare offers a yearly Wellness Visit which allows our clinical staff to assess your need for preventative services including immunizations, lifestyle education, counseling to decrease risk of preventable diseases and screening for fall risk and other medical concerns.    This visit is provided free of charge (no copay) for all Medicare recipients. The clinical pharmacists at [facility] have begun to conduct these Wellness Visits which will also include a thorough review of all your medications.    As you primary medical provider recommend that you make an appointment for your Annual Wellness Visit if you have not done so already this year.  You may set up this appointment before you leave today or you may call back ([phone]) and schedule an appointment.  Please make sure when you call that you mention that you are scheduling your Annual Wellness Visit with the clinical pharmacist so that the appointment may be made for the proper length of time.     Continue current medications. Continue good therapeutic lifestyle changes which include good diet and exercise. Fall precautions discussed with patient. If an FOBT was given today- please return it to our front desk. If you are over 47 years old - you may need Prevnar 45 or the adult Pneumonia vaccine.  Present the Lipitor 10 mg one daily Have lab work checked in 4 weeks If lab work is no worse and there is no increased aching in her legs increase her Lipitor back to 20 mg daily Continue to monitor blood pressures at home Hold off on the Advil for a couple weeks and see  if that is affecting blood pressure Drink more water

## 2013-06-01 DIAGNOSIS — M545 Low back pain, unspecified: Secondary | ICD-10-CM | POA: Diagnosis not present

## 2013-06-17 DIAGNOSIS — IMO0002 Reserved for concepts with insufficient information to code with codable children: Secondary | ICD-10-CM | POA: Diagnosis not present

## 2013-06-17 DIAGNOSIS — M5137 Other intervertebral disc degeneration, lumbosacral region: Secondary | ICD-10-CM | POA: Diagnosis not present

## 2013-06-24 ENCOUNTER — Other Ambulatory Visit: Payer: Medicare Other

## 2013-06-24 ENCOUNTER — Telehealth: Payer: Self-pay | Admitting: Family Medicine

## 2013-06-24 DIAGNOSIS — E785 Hyperlipidemia, unspecified: Secondary | ICD-10-CM | POA: Diagnosis not present

## 2013-06-24 DIAGNOSIS — Z79899 Other long term (current) drug therapy: Secondary | ICD-10-CM | POA: Diagnosis not present

## 2013-06-24 NOTE — Telephone Encounter (Signed)
Patient does not have to be fasting for labs

## 2013-06-25 LAB — CK TOTAL AND CKMB (NOT AT ARMC)
CK TOTAL: 219 U/L — AB (ref 24–204)
CK-MB Index: 4.7 ng/mL (ref 0.0–10.4)

## 2013-06-25 LAB — HEPATIC FUNCTION PANEL
ALBUMIN: 4.5 g/dL (ref 3.5–4.8)
ALK PHOS: 65 IU/L (ref 39–117)
ALT: 19 IU/L (ref 0–44)
AST: 24 IU/L (ref 0–40)
BILIRUBIN DIRECT: 0.17 mg/dL (ref 0.00–0.40)
BILIRUBIN TOTAL: 0.5 mg/dL (ref 0.0–1.2)
TOTAL PROTEIN: 6.5 g/dL (ref 6.0–8.5)

## 2013-06-28 ENCOUNTER — Telehealth: Payer: Self-pay | Admitting: Family Medicine

## 2013-06-28 NOTE — Telephone Encounter (Signed)
Message copied by Waverly Ferrari on Mon Jun 28, 2013 10:16 AM ------      Message from: Chipper Herb      Created: Sun Jun 27, 2013  1:35 PM       All liver function tests are within normal limits      The CK is slightly elevated at 219, he was the patient having increased muscle aches or myalgias with statin drugs? Please ask him this question. ------

## 2013-06-29 NOTE — Telephone Encounter (Signed)
Patient had leg weakness with Lipitor and was switched to Livalo. He didn't see any improvement while on it and switched back to Lipitor.  No aching, just weakness.

## 2013-06-29 NOTE — Telephone Encounter (Signed)
I spoke with the patient. He is going to stop the Lipitor. He will come in in about 4 weeks or so and have a repeat CK, lipid liver, and thyroid profile. Please put at a future order written for this. At that point in time he will start back on livalo, one half of a 4 mg daily . He will wait 4-5 weeks after taking livalo and come back and get a repeat lipid/ liver and CK Please put this order and also

## 2013-06-30 ENCOUNTER — Other Ambulatory Visit: Payer: Self-pay | Admitting: *Deleted

## 2013-06-30 DIAGNOSIS — R748 Abnormal levels of other serum enzymes: Secondary | ICD-10-CM

## 2013-06-30 DIAGNOSIS — R7989 Other specified abnormal findings of blood chemistry: Secondary | ICD-10-CM

## 2013-06-30 DIAGNOSIS — E785 Hyperlipidemia, unspecified: Secondary | ICD-10-CM

## 2013-06-30 NOTE — Telephone Encounter (Signed)
Labs ordered.

## 2013-07-01 ENCOUNTER — Other Ambulatory Visit: Payer: Self-pay | Admitting: Family Medicine

## 2013-07-01 DIAGNOSIS — N281 Cyst of kidney, acquired: Secondary | ICD-10-CM | POA: Diagnosis not present

## 2013-07-01 DIAGNOSIS — N200 Calculus of kidney: Secondary | ICD-10-CM | POA: Diagnosis not present

## 2013-08-05 ENCOUNTER — Other Ambulatory Visit (INDEPENDENT_AMBULATORY_CARE_PROVIDER_SITE_OTHER): Payer: Medicare Other

## 2013-08-05 DIAGNOSIS — IMO0001 Reserved for inherently not codable concepts without codable children: Secondary | ICD-10-CM

## 2013-08-05 DIAGNOSIS — Z125 Encounter for screening for malignant neoplasm of prostate: Secondary | ICD-10-CM

## 2013-08-05 DIAGNOSIS — R5381 Other malaise: Secondary | ICD-10-CM

## 2013-08-05 DIAGNOSIS — E785 Hyperlipidemia, unspecified: Secondary | ICD-10-CM | POA: Diagnosis not present

## 2013-08-05 DIAGNOSIS — M791 Myalgia, unspecified site: Secondary | ICD-10-CM

## 2013-08-05 DIAGNOSIS — R5383 Other fatigue: Secondary | ICD-10-CM | POA: Diagnosis not present

## 2013-08-05 DIAGNOSIS — R7989 Other specified abnormal findings of blood chemistry: Secondary | ICD-10-CM | POA: Diagnosis not present

## 2013-08-05 DIAGNOSIS — E559 Vitamin D deficiency, unspecified: Secondary | ICD-10-CM | POA: Diagnosis not present

## 2013-08-06 LAB — CBC WITH DIFFERENTIAL
BASOS: 0 %
Basophils Absolute: 0 10*3/uL (ref 0.0–0.2)
Eos: 1 %
Eosinophils Absolute: 0.1 10*3/uL (ref 0.0–0.4)
HEMATOCRIT: 43.3 % (ref 37.5–51.0)
HEMOGLOBIN: 14.1 g/dL (ref 12.6–17.7)
Immature Grans (Abs): 0 10*3/uL (ref 0.0–0.1)
Immature Granulocytes: 0 %
Lymphocytes Absolute: 2.1 10*3/uL (ref 0.7–3.1)
Lymphs: 38 %
MCH: 31.1 pg (ref 26.6–33.0)
MCHC: 32.6 g/dL (ref 31.5–35.7)
MCV: 96 fL (ref 79–97)
Monocytes Absolute: 0.4 10*3/uL (ref 0.1–0.9)
Monocytes: 6 %
NEUTROS ABS: 3.1 10*3/uL (ref 1.4–7.0)
Neutrophils Relative %: 55 %
Platelets: 173 10*3/uL (ref 150–379)
RBC: 4.53 x10E6/uL (ref 4.14–5.80)
RDW: 13.9 % (ref 12.3–15.4)
WBC: 5.6 10*3/uL (ref 3.4–10.8)

## 2013-08-06 LAB — BMP8+EGFR
BUN / CREAT RATIO: 28 — AB (ref 10–22)
BUN: 22 mg/dL (ref 8–27)
CO2: 26 mmol/L (ref 18–29)
CREATININE: 0.78 mg/dL (ref 0.76–1.27)
Calcium: 9.4 mg/dL (ref 8.6–10.2)
Chloride: 105 mmol/L (ref 97–108)
GFR calc Af Amer: 106 mL/min/{1.73_m2} (ref >59)
GFR, EST NON AFRICAN AMERICAN: 91 mL/min/{1.73_m2} (ref >59)
Glucose: 96 mg/dL (ref 65–99)
Potassium: 4.6 mmol/L (ref 3.5–5.2)
SODIUM: 144 mmol/L (ref 134–144)

## 2013-08-06 LAB — VITAMIN D 25 HYDROXY (VIT D DEFICIENCY, FRACTURES): Vit D, 25-Hydroxy: 67.4 ng/mL (ref 30.0–100.0)

## 2013-08-06 LAB — NMR, LIPOPROFILE
CHOLESTEROL: 221 mg/dL — AB (ref 100–199)
HDL Cholesterol by NMR: 53 mg/dL (ref >39)
HDL PARTICLE NUMBER: 29.8 umol/L — AB (ref >=30.5)
LDL Particle Number: 1884 nmol/L — ABNORMAL HIGH (ref <1000)
LDL SIZE: 21.5 nm (ref >20.5)
LDLC SERPL CALC-MCNC: 149 mg/dL — ABNORMAL HIGH (ref 0–99)
LP-IR Score: <25 (ref <=45)
Small LDL Particle Number: 563 nmol/L — ABNORMAL HIGH (ref <=527)
TRIGLYCERIDES BY NMR: 95 mg/dL (ref 0–149)

## 2013-08-06 LAB — HEPATIC FUNCTION PANEL
ALT: 21 IU/L (ref 0–44)
AST: 25 IU/L (ref 0–40)
Albumin: 4.4 g/dL (ref 3.5–4.8)
Alkaline Phosphatase: 68 IU/L (ref 39–117)
BILIRUBIN DIRECT: 0.14 mg/dL (ref 0.00–0.40)
BILIRUBIN TOTAL: 0.6 mg/dL (ref 0.0–1.2)
Total Protein: 6.7 g/dL (ref 6.0–8.5)

## 2013-08-06 LAB — PSA, TOTAL AND FREE
PSA, Free Pct: 24 %
PSA, Free: 0.48 ng/mL
PSA: 2 ng/mL (ref 0.0–4.0)

## 2013-08-07 ENCOUNTER — Ambulatory Visit (INDEPENDENT_AMBULATORY_CARE_PROVIDER_SITE_OTHER): Payer: Medicare Other | Admitting: Family Medicine

## 2013-08-07 VITALS — BP 147/79 | HR 65 | Temp 98.1°F | Ht 72.0 in | Wt 211.0 lb

## 2013-08-07 DIAGNOSIS — M5410 Radiculopathy, site unspecified: Secondary | ICD-10-CM

## 2013-08-07 DIAGNOSIS — I2510 Atherosclerotic heart disease of native coronary artery without angina pectoris: Secondary | ICD-10-CM

## 2013-08-07 DIAGNOSIS — IMO0002 Reserved for concepts with insufficient information to code with codable children: Secondary | ICD-10-CM | POA: Diagnosis not present

## 2013-08-07 MED ORDER — HYDROCODONE-ACETAMINOPHEN 5-325 MG PO TABS: 1.0000 | ORAL_TABLET | Freq: Four times a day (QID) | ORAL | Status: DC | PRN

## 2013-08-07 MED ORDER — PREDNISONE 10 MG PO TABS: ORAL_TABLET | ORAL | Status: DC

## 2013-08-07 NOTE — Progress Notes (Signed)
   Subjective:    Patient ID: [name], male    DOB: Aug 01, 1940, 73 y.o.   MRN: [id number]  HPI This 73 y.o. male presents for evaluation of back pain.  He was playing golf and he injured his back. He is c/o left back pain. He has pain radiating down his left leg to the left calf and this is worse when he walks.   Review of Systems No chest pain, SOB, HA, dizziness, vision change, N/V, diarrhea, constipation, dysuria, urinary urgency or frequency, myalgias, arthralgias or rash.     Objective:   Physical Exam  Vital signs noted  Well developed well nourished male.  HEENT - Head atraumatic Normocephalic Respiratory - Lungs CTA bilateral Cardiac - RRR S1 and S2 without murmur GI - Abdomen soft Nontender and bowel sounds active x 4 MS - Limping with LLE and TTP left LS spine     Assessment & Plan:  Back pain with left-sided radiculopathy - Plan: predniSONE (DELTASONE) 10 MG tablet, HYDROcodone-acetaminophen (NORCO) 5-325 MG per tablet  Try steroids, rest, and pain meds and if not resolving need to see Dr. Nelva Bush   Follow up prn.  Lysbeth Penner FNP

## 2013-08-07 NOTE — Progress Notes (Signed)
Pt was notified of results Verbalizes understanding

## 2013-08-11 ENCOUNTER — Telehealth: Payer: Self-pay | Admitting: Family Medicine

## 2013-08-11 LAB — CK: Total CK: 327 U/L — ABNORMAL HIGH (ref 24–204)

## 2013-08-11 LAB — SPECIMEN STATUS REPORT

## 2013-08-11 NOTE — Telephone Encounter (Signed)
See previous note and follow directions for it

## 2013-08-11 NOTE — Telephone Encounter (Signed)
Patient take aleve at night and in morning and is wondering if that could cause the CK to go up?

## 2013-08-11 NOTE — Addendum Note (Signed)
Addended by: Zannie Cove on: 08/11/2013 11:58 AM   Modules accepted: Orders

## 2013-08-17 ENCOUNTER — Telehealth: Payer: Self-pay | Admitting: *Deleted

## 2013-08-17 ENCOUNTER — Other Ambulatory Visit (INDEPENDENT_AMBULATORY_CARE_PROVIDER_SITE_OTHER): Payer: Medicare Other

## 2013-08-17 DIAGNOSIS — R748 Abnormal levels of other serum enzymes: Secondary | ICD-10-CM

## 2013-08-17 DIAGNOSIS — M25579 Pain in unspecified ankle and joints of unspecified foot: Secondary | ICD-10-CM

## 2013-08-17 MED ORDER — SILDENAFIL CITRATE 100 MG PO TABS: 50.0000 mg | ORAL_TABLET | Freq: Every day | ORAL | Status: DC | PRN

## 2013-08-17 NOTE — Telephone Encounter (Signed)
Sent in to pharm per [facility] request

## 2013-08-17 NOTE — Progress Notes (Signed)
Pt came in for lab  only 

## 2013-08-19 LAB — ARTHRITIS PANEL
Basophils Absolute: 0 10*3/uL (ref 0.0–0.2)
Basos: 0 %
Eos: 1 %
Eosinophils Absolute: 0.1 10*3/uL (ref 0.0–0.4)
HCT: 41.9 % (ref 37.5–51.0)
Hemoglobin: 13.5 g/dL (ref 12.6–17.7)
IMMATURE GRANS (ABS): 0 10*3/uL (ref 0.0–0.1)
IMMATURE GRANULOCYTES: 0 %
Lymphocytes Absolute: 1.4 10*3/uL (ref 0.7–3.1)
Lymphs: 29 %
MCH: 30.2 pg (ref 26.6–33.0)
MCHC: 32.2 g/dL (ref 31.5–35.7)
MCV: 94 fL (ref 79–97)
MONOCYTES: 10 %
Monocytes Absolute: 0.5 10*3/uL (ref 0.1–0.9)
NEUTROS PCT: 60 %
Neutrophils Absolute: 3 10*3/uL (ref 1.4–7.0)
PLATELETS: 152 10*3/uL (ref 150–379)
RBC: 4.47 x10E6/uL (ref 4.14–5.80)
RDW: 14.2 % (ref 12.3–15.4)
Rhuematoid fact SerPl-aCnc: <7 IU/mL (ref 0.0–13.9)
SED RATE: 2 mm/h (ref 0–30)
URIC ACID: 4.2 mg/dL (ref 3.7–8.6)
WBC: 5 10*3/uL (ref 3.4–10.8)

## 2013-08-19 LAB — CYCLIC CITRUL PEPTIDE ANTIBODY, IGG/IGA: Cyclic Citrullin Peptide Ab: <1 units (ref 0–19)

## 2013-08-19 LAB — CK: CK TOTAL: 153 U/L (ref 24–204)

## 2013-08-26 ENCOUNTER — Other Ambulatory Visit: Payer: Medicare Other

## 2013-09-02 ENCOUNTER — Ambulatory Visit (INDEPENDENT_AMBULATORY_CARE_PROVIDER_SITE_OTHER): Payer: Medicare Other | Admitting: Family Medicine

## 2013-09-02 ENCOUNTER — Ambulatory Visit (INDEPENDENT_AMBULATORY_CARE_PROVIDER_SITE_OTHER): Payer: Medicare Other

## 2013-09-02 ENCOUNTER — Encounter: Payer: Self-pay | Admitting: Family Medicine

## 2013-09-02 VITALS — BP 121/80 | HR 62 | Temp 98.4°F | Ht 72.0 in | Wt 209.0 lb

## 2013-09-02 DIAGNOSIS — E785 Hyperlipidemia, unspecified: Secondary | ICD-10-CM | POA: Diagnosis not present

## 2013-09-02 DIAGNOSIS — I2510 Atherosclerotic heart disease of native coronary artery without angina pectoris: Secondary | ICD-10-CM

## 2013-09-02 DIAGNOSIS — I10 Essential (primary) hypertension: Secondary | ICD-10-CM

## 2013-09-02 DIAGNOSIS — M791 Myalgia, unspecified site: Secondary | ICD-10-CM

## 2013-09-02 DIAGNOSIS — R748 Abnormal levels of other serum enzymes: Secondary | ICD-10-CM

## 2013-09-02 DIAGNOSIS — IMO0001 Reserved for inherently not codable concepts without codable children: Secondary | ICD-10-CM

## 2013-09-02 DIAGNOSIS — M2550 Pain in unspecified joint: Secondary | ICD-10-CM

## 2013-09-02 DIAGNOSIS — N400 Enlarged prostate without lower urinary tract symptoms: Secondary | ICD-10-CM

## 2013-09-02 LAB — POCT UA - MICROSCOPIC ONLY
CRYSTALS, UR, HPF, POC: NEGATIVE
Casts, Ur, LPF, POC: NEGATIVE
Yeast, UA: NEGATIVE

## 2013-09-02 LAB — POCT URINALYSIS DIPSTICK
Bilirubin, UA: NEGATIVE
Blood, UA: NEGATIVE
GLUCOSE UA: NEGATIVE
KETONES UA: NEGATIVE
Leukocytes, UA: NEGATIVE
Nitrite, UA: NEGATIVE
Urobilinogen, UA: NEGATIVE
pH, UA: 5

## 2013-09-02 MED ORDER — VARDENAFIL HCL 20 MG PO TABS: 20.0000 mg | ORAL_TABLET | Freq: Every day | ORAL | Status: DC | PRN

## 2013-09-02 NOTE — Patient Instructions (Addendum)
Medicare Annual Wellness Visit  [facility] and the medical providers at [facility] strive to bring you the best medical care.  In doing so we not only want to address your current medical conditions and concerns but also to detect new conditions early and prevent illness, disease and health-related problems.    Medicare offers a yearly Wellness Visit which allows our clinical staff to assess your need for preventative services including immunizations, lifestyle education, counseling to decrease risk of preventable diseases and screening for fall risk and other medical concerns.    This visit is provided free of charge (no copay) for all Medicare recipients. The clinical pharmacists at [facility] have begun to conduct these Wellness Visits which will also include a thorough review of all your medications.    As you primary medical provider recommend that you make an appointment for your Annual Wellness Visit if you have not done so already this year.  You may set up this appointment before you leave today or you may call back [phone]) and schedule an appointment.  Please make sure when you call that you mention that you are scheduling your Annual Wellness Visit with the clinical pharmacist so that the appointment may be made for the proper length of time.     Continue current medications. Continue good therapeutic lifestyle changes which include good diet and exercise. Fall precautions discussed with patient. If an FOBT was given today- please return it to our front desk. If you are over 19 years old - you may need Prevnar 91 or the adult Pneumonia vaccine.  We will arrange the appointment with the rheumatologist and will call you soon as we are able to get this appointment.  Continue to followup with the orthopedic surgeon about your knees.  Return  the FOBT We will call you with the results of a chest x-ray was as results  are available Keep yearly appointment with the cardiologist

## 2013-09-02 NOTE — Progress Notes (Signed)
Subjective:    Patient ID: [name] [name], male    DOB: 1940-12-21, 73 y.o.   MRN: [id number]  HPI Pt here for follow up and management of chronic medical problems. The patient will get his annual physical exam today. His lab work will be reviewed with him today. He will get a chest x-ray today. He will be given FOBT to return. Will also get a urinalysis today. He continues to have arthralgias in the lower extremity and a most recent CK was normal. He has had an MRI of his LS spine in the past couple of months by the orthopedic surgeon. He indicates that this showed degenerative disc and bulging disc. He received 2 shots which helped his back pain but did not help his leg pain. He was told by the orthopedic surgeon that he did not have spinal stenosis. We want to resolve the issue with the arthralgias so that we can hopefully get him back on a statin drug because of his history of heart disease and bypass surgery. In reviewing his family history his father died suddenly at 66 of a heart attack. His mother had a history of hypertension stroke and Alzheimer's and died at 23. The patient also has erectile dysfunction.        Patient Active Problem List   Diagnosis Date Noted  . B12 deficiency 10/01/2012  . Allergic rhinitis 10/01/2012  . BPH (benign prostatic hyperplasia) 10/01/2012  . PREMATURE VENTRICULAR CONTRACTIONS, history of 09/01/2009  . COLONIC POLYPS 04/14/2009  . Hyperlipidemia 04/14/2009  . HYPERTENSION 04/14/2009  . CAD 04/14/2009  . HIATAL HERNIA 04/14/2009  . HERNIATED DISC 04/14/2009   Outpatient Encounter Prescriptions as of 09/02/2013  Medication Sig  . aspirin 81 MG tablet Take 81-162 mg by mouth daily.    . Cholecalciferol (VITAMIN D3) 5000 UNITS CAPS Take 1 capsule by mouth daily.  . Coenzyme Q10 (COQ10) 100 MG CAPS Take 1 capsule by mouth 2 (two) times daily.    . fluticasone (FLONASE) 50 MCG/ACT nasal spray USE 2 SPRAYS IN EACH NOSTRIL ONCE DAILY.  . metoprolol  tartrate (LOPRESSOR) 25 MG tablet TAKE  (1)  TABLET TWICE A DAY.  Marland Kitchen Misc Natural Products (OSTEO BI-FLEX ADV JOINT SHIELD PO) Take by mouth as directed.  . Multiple Vitamin (MULTI VITAMIN MENS PO) Take 1 tablet by mouth 2 (two) times daily.  . Omega-3 Fatty Acids (FISH OIL) 1000 MG CAPS Take 2 capsules by mouth daily. 1000 mg?   . [DISCONTINUED] Pitavastatin Calcium 4 MG TABS Take 2 mg by mouth daily.  Marland Kitchen HYDROcodone-acetaminophen (NORCO) 5-325 MG per tablet Take 1 tablet by mouth every 6 (six) hours as needed for moderate pain.  . sildenafil (VIAGRA) 100 MG tablet Take 0.5-1 tablets (50-100 mg total) by mouth daily as needed for erectile dysfunction.  . tamsulosin (FLOMAX) 0.4 MG CAPS capsule Take 1 capsule (0.4 mg total) by mouth daily.  . [DISCONTINUED] meloxicam (MOBIC) 15 MG tablet TAKE (1) TABLET DAILY AS DIRECTED.  . [DISCONTINUED] predniSONE (DELTASONE) 10 MG tablet Take 4 po qd x 2 days then 3 po qd x 2 days then 2 po qd x 2 days then 1 po qd x 2 days then stop    Review of Systems  Constitutional: Negative.   HENT: Negative.   Eyes: Negative.   Respiratory: Negative.   Cardiovascular: Negative.   Gastrointestinal: Negative.   Endocrine: Negative.   Genitourinary: Negative.   Musculoskeletal: Positive for myalgias (leg aches).  Skin: Negative.  Allergic/Immunologic: Negative.   Neurological: Negative.   Hematological: Negative.   Psychiatric/Behavioral: Negative.        Objective:   Physical Exam  Nursing note and vitals reviewed. Constitutional: He is oriented to person, place, and time. He appears well-developed and well-nourished. No distress.  HENT:  Head: Normocephalic and atraumatic.  Right Ear: External ear normal.  Left Ear: External ear normal.  Nose: Nose normal.  Mouth/Throat: Oropharynx is clear and moist. No oropharyngeal exudate.  Eyes: Conjunctivae and EOM are normal. Pupils are equal, round, and reactive to light. Right eye exhibits no discharge. Left  eye exhibits no discharge. No scleral icterus.  Neck: Normal range of motion. Neck supple. No thyromegaly present.  Cardiovascular: Normal rate, regular rhythm, normal heart sounds and intact distal pulses.  Exam reveals no gallop and no friction rub.   No murmur heard. At 60 per minute  Pulmonary/Chest: Effort normal and breath sounds normal. No respiratory distress. He has no wheezes. He has no rales. He exhibits no tenderness.  Abdominal: Soft. Bowel sounds are normal. He exhibits no mass. There is no tenderness. There is no rebound and no guarding.  Genitourinary: Rectum normal and penis normal.  The prostate was slightly enlarged with no lumps or masses. There were no rectal masses. There was no inguinal hernia on either side. There were no inguinal nodes. The external genitalia were normal  Musculoskeletal: Normal range of motion. He exhibits no edema and no tenderness.  Lymphadenopathy:    He has no cervical adenopathy.  Neurological: He is alert and oriented to person, place, and time. He has normal reflexes. No cranial nerve deficit.  Skin: Skin is warm and dry. No rash noted. No erythema. No pallor.  Psychiatric: He has a normal mood and affect. His behavior is normal. Judgment and thought content normal.   BP 121/80  Pulse 62  Temp(Src) 98.4 F (36.9 C) (Oral)  Ht 6' (1.829 m)  Wt 209 lb (94.802 kg)  BMI 28.34 kg/m2  [facility] reading (PRIMARY) by  Dr. Brunilda Payor x-ray -no active disease                                 Results for orders placed in visit on 09/02/13  POCT UA - MICROSCOPIC ONLY      Result Value Ref Range   WBC, Ur, HPF, POC 1-3     RBC, urine, microscopic rare     Bacteria, U Microscopic occ     Mucus, UA mod     Epithelial cells, urine per micros occ     Crystals, Ur, HPF, POC neg     Casts, Ur, LPF, POC neg     Yeast, UA neg    POCT URINALYSIS DIPSTICK      Result Value Ref Range   Color, UA gold     Clarity, UA clear     Glucose, UA neg      Bilirubin, UA neg     Ketones, UA neg     Spec Grav, UA >=1.030     Blood, UA neg     pH, UA 5.0     Protein, UA 4+     Urobilinogen, UA negative     Nitrite, UA neg     Leukocytes, UA Negative     The patient was aware of the urinalysis before he left the office      Assessment & Plan:  1. CAD  2. BPH (benign prostatic hyperplasia) - POCT UA - Microscopic Only - POCT urinalysis dipstick  3. Hyperlipidemia  4. HYPERTENSION - DG Chest 2 View; Future  5. Arthralgia - Ambulatory referral to Rheumatology  6. Elevated CK - Ambulatory referral to Rheumatology  7. Myalgia  - Ambulatory referral to Rheumatology   Meds ordered this encounter  Medications  . vardenafil (LEVITRA) 20 MG tablet    Sig: Take 1 tablet (20 mg total) by mouth daily as needed for erectile dysfunction.    Dispense:  6 tablet    Refill:  1     Patient Instructions                       Medicare Annual Wellness Visit  [facility] and the medical providers at [facility] strive to bring you the best medical care.  In doing so we not only want to address your current medical conditions and concerns but also to detect new conditions early and prevent illness, disease and health-related problems.    Medicare offers a yearly Wellness Visit which allows our clinical staff to assess your need for preventative services including immunizations, lifestyle education, counseling to decrease risk of preventable diseases and screening for fall risk and other medical concerns.    This visit is provided free of charge (no copay) for all Medicare recipients. The clinical pharmacists at [facility] have begun to conduct these Wellness Visits which will also include a thorough review of all your medications.    As you primary medical provider recommend that you make an appointment for your Annual Wellness Visit if you have not done so already this year.  You may set up this  appointment before you leave today or you may call back ([phone]) and schedule an appointment.  Please make sure when you call that you mention that you are scheduling your Annual Wellness Visit with the clinical pharmacist so that the appointment may be made for the proper length of time.     Continue current medications. Continue good therapeutic lifestyle changes which include good diet and exercise. Fall precautions discussed with patient. If an FOBT was given today- please return it to our front desk. If you are over 17 years old - you may need Prevnar 79 or the adult Pneumonia vaccine.  We will arrange the appointment with the rheumatologist and will call you soon as we are able to get this appointment.  Continue to followup with the orthopedic surgeon about your knees.  Return  the FOBT We will call you with the results of a chest x-ray was as results are available Keep yearly appointment with the cardiologist   Arrie Senate MD

## 2013-09-06 ENCOUNTER — Other Ambulatory Visit: Payer: Self-pay | Admitting: *Deleted

## 2013-09-06 ENCOUNTER — Other Ambulatory Visit: Payer: Medicare Other

## 2013-09-06 ENCOUNTER — Telehealth: Payer: Self-pay | Admitting: *Deleted

## 2013-09-06 DIAGNOSIS — Z1212 Encounter for screening for malignant neoplasm of rectum: Secondary | ICD-10-CM | POA: Diagnosis not present

## 2013-09-06 DIAGNOSIS — R9389 Abnormal findings on diagnostic imaging of other specified body structures: Secondary | ICD-10-CM

## 2013-09-06 NOTE — Telephone Encounter (Signed)
Pt aware of CXR results and appointment date/time of CT Chest

## 2013-09-06 NOTE — Telephone Encounter (Signed)
Message copied by Hinda Lenis on Mon Sep 06, 2013  3:57 PM ------      Message from: Chipper Herb      Created: Thu Sep 02, 2013  5:59 PM       As per radiology report-the radiologist is concerned that there could be some nodularity at the left lung base. It could be a nipple shadow. We will schedule a CT scan to make sure that this is okay       ------

## 2013-09-08 LAB — FECAL OCCULT BLOOD, IMMUNOCHEMICAL: FECAL OCCULT BLD: NEGATIVE

## 2013-09-09 ENCOUNTER — Ambulatory Visit (HOSPITAL_COMMUNITY)
Admission: RE | Admit: 2013-09-09 | Discharge: 2013-09-09 | Disposition: A | Discharge status: Home / Self Care | Payer: Medicare Other | Source: Ambulatory Visit | Attending: Family Medicine | Admitting: Family Medicine

## 2013-09-09 ENCOUNTER — Other Ambulatory Visit: Payer: Self-pay | Admitting: Family Medicine

## 2013-09-09 DIAGNOSIS — R918 Other nonspecific abnormal finding of lung field: Secondary | ICD-10-CM | POA: Diagnosis not present

## 2013-09-09 DIAGNOSIS — J984 Other disorders of lung: Secondary | ICD-10-CM | POA: Diagnosis not present

## 2013-09-09 DIAGNOSIS — R937 Abnormal findings on diagnostic imaging of other parts of musculoskeletal system: Secondary | ICD-10-CM | POA: Diagnosis not present

## 2013-09-09 DIAGNOSIS — R9389 Abnormal findings on diagnostic imaging of other specified body structures: Secondary | ICD-10-CM

## 2013-09-09 MED ORDER — IOHEXOL 300 MG/ML  SOLN
80.0000 mL | Freq: Once | INTRAMUSCULAR | Status: AC | PRN
Start: 2013-09-09 — End: 2013-09-09
  Administered 2013-09-09: 80 mL via INTRAVENOUS

## 2013-09-10 ENCOUNTER — Telehealth: Payer: Self-pay | Admitting: Family Medicine

## 2013-09-10 DIAGNOSIS — M2550 Pain in unspecified joint: Secondary | ICD-10-CM

## 2013-09-10 NOTE — Telephone Encounter (Signed)
He said you told him to call if he had problem getting in with him and he is. The doctor is not seeing new patients. He just wanted to let you know. However he does have an appointment on 7/23 with Dr. Sherrilee Gilles. Would you like for him to keep that appointment?

## 2013-09-11 NOTE — Telephone Encounter (Signed)
Please get Dr. Esperanza Sheets, 5 and I will discuss this patient with him. I believe that he will be willing to see him at least one time .

## 2013-09-13 NOTE — Telephone Encounter (Signed)
Metcalf called [facility] and he states that [facility] Oman has put a hold on his NEW pt's. He is to recheck with [facility] and let [facility] know if he will or will not be able to see him.

## 2013-09-13 NOTE — Telephone Encounter (Signed)
We will contact Dr Lonzo Cloud - i have left them a message to contact [id number]-jhb

## 2013-09-14 NOTE — Telephone Encounter (Signed)
Pt aware that Esperanza Sheets can not accept new patients and that we are setting up appt for him to see Dr Amil Amen. Referral has been placed!

## 2013-09-29 ENCOUNTER — Other Ambulatory Visit: Payer: Self-pay

## 2013-09-29 MED ORDER — METOPROLOL TARTRATE 25 MG PO TABS: ORAL_TABLET | ORAL | Status: DC

## 2013-10-19 DIAGNOSIS — M159 Polyosteoarthritis, unspecified: Secondary | ICD-10-CM | POA: Diagnosis not present

## 2013-10-19 DIAGNOSIS — IMO0002 Reserved for concepts with insufficient information to code with codable children: Secondary | ICD-10-CM | POA: Diagnosis not present

## 2013-10-19 DIAGNOSIS — M79609 Pain in unspecified limb: Secondary | ICD-10-CM | POA: Diagnosis not present

## 2013-10-19 DIAGNOSIS — M2559 Pain in other specified joint: Secondary | ICD-10-CM | POA: Diagnosis not present

## 2013-11-02 ENCOUNTER — Ambulatory Visit (INDEPENDENT_AMBULATORY_CARE_PROVIDER_SITE_OTHER): Payer: Self-pay

## 2013-11-02 ENCOUNTER — Ambulatory Visit (INDEPENDENT_AMBULATORY_CARE_PROVIDER_SITE_OTHER): Payer: Medicare Other | Admitting: Neurology

## 2013-11-02 DIAGNOSIS — M5417 Radiculopathy, lumbosacral region: Secondary | ICD-10-CM

## 2013-11-02 DIAGNOSIS — R29898 Other symptoms and signs involving the musculoskeletal system: Secondary | ICD-10-CM

## 2013-11-02 DIAGNOSIS — Z0289 Encounter for other administrative examinations: Secondary | ICD-10-CM

## 2013-11-02 NOTE — Progress Notes (Signed)
  [facility]    Provider:  Dr Jaynee Eagles Referring Provider: Chipper Herb, MD Primary Care Physician:  Redge Gainer, MD  CC:  weakness  HPI:  [name] [name] is a 73 y.o. male here as a referral from Dr. Laurance Flatten for weakness. Weakness started 2 years ago. He has been having difficulty getting out of very low seats but can get out of chairs and his SUV seats fine without using his hands. He is going up stairs fine. No weakness of his arms. No sensory complaints. He has low back pain with occassional radicular symptoms right > left. He had no difficulties with sports as a young man, he played football and ran track. His feet feel funny in shoes but he does not have high arches. Focused exam shows 5/5 lower extremity strength, 2+ patellar reflexes and absent achilles reflexes. Patient rises easily out of chair in office without using hands.   Summary: Nerve conduction studies were performed on his right upper and bilateral lower extremity reflexes.  The right Median motor nerve showed normal conductions and normal F wave latency. The right Median 2nd digit sensory nerve showed normal conduction.  Evaluation of the left peroneal motor nerve showed normal conductions and prolonged F  wave latency(60.73ms, N<56). Evaluation of the left tibial motor nerve showed normal conductions and normal F wave latency.   Evaluation of the right peroneal motor nerve showed reduced amplitude (1.0 mV, N>2) and prolonged F wave latency (61.70ms, N<56).  Evaluation of the right tibial motor nerve showed reduced amplitude (1.6 mV, N>3) and prolonged F wave latency (62.64ms, N<58) . Evaluation of the right and left superficial peroneal sensory nerves showed normal conductions.   EMG needle study was performed on selected right lower extremity muscles: Evaluation of the right Extensor Hallucis muscle showed increased spontaneous activity and increased amplitude. The right gastrocnemius muscle  showed increased spontaneous activity and decreased recruitment.  The following right lower extremity muscles were normal: Peroneus Longus, Anterior Tibialis, Vastus Medialis, Iliopsoas, Biceps Femoris (long head), Gluteus Minimus, Gluteus Maximus and L5 and T10 paraspinal muscles. The left Iliopsoas muscle was normal.    Assessment/Plan:   This is an abnormal study. There is marked asymmetry in the right peroneal and right tibial motor conductions as to compared to the left side.  In addition, there is evidence of acute/ongoing denervation and chronic neurogenic changes in distal L5 or S1 muscles on the right.  Given the normal sensory potentials, this is suggestive of an ongoing right S1 radiculopathy. The prolonged F waves may be secondary to bilateral L5/S1 radiculopathy or a sign of mild/early peripheral polyneuropathy. No electrophysiologic evidence for myopathy or myositis. EMG/NCS can be negative in myopathy/myositis in early or mild disease or due to sampling error. If patient's weakness continues to progress can repeat the EMG/ NCS in 6- 12 weeks if clinically warranted.   Clinical correlation is suggested.   Sarina Ill, MD  [facility] [address] [place], [place] [address]  Phone [phone] Fax [phone]

## 2013-11-03 ENCOUNTER — Ambulatory Visit: Payer: Medicare Other | Admitting: Diagnostic Neuroimaging

## 2013-11-04 ENCOUNTER — Encounter: Payer: Medicare Other | Admitting: Neurology

## 2013-11-15 ENCOUNTER — Other Ambulatory Visit: Payer: Self-pay | Admitting: Family Medicine

## 2013-11-16 ENCOUNTER — Telehealth: Payer: Self-pay | Admitting: Neurology

## 2013-11-16 NOTE — Telephone Encounter (Signed)
Can you please call pateint back and let him know that I forwarded the results to Dr Laurance Flatten who referred him to the practice. I routed the results again tonight to Dr. Laurance Flatten. If Dr. Laurance Flatten is a [facility] physician, Dr. Laurance Flatten also has access to my report on EPIC. Can you find out what the issue is? Did Dr. Laurance Flatten say he didn't have the results or did the patient not get a call back from him? He will have to follow up with Dr. Laurance Flatten to discuss the results, so need information on why he hasn't been able to get that.  Also ask him which Dr. Amil Amen he would like the emg/ncs results forwarded to and would you please take care of that? Thank you

## 2013-11-16 NOTE — Telephone Encounter (Signed)
Not on med list

## 2013-11-16 NOTE — Telephone Encounter (Signed)
Patient calling about NCV/EMG results and requesting Dr. Amil Amen receive results.  Patient requesting return call before Thursday due to going on vacation.  Please call anytime and please don't leave detailed message.

## 2013-11-17 NOTE — Telephone Encounter (Signed)
Patient picked up a copy from Dr. Laurance Flatten office but did not understand what the results mean. Dr. Laurance Flatten is now on vacation and the patient be on vaction starting tomorrow. Will send a copy to Dr. Amil Amen office to see if someone there will give the patient a call today. Patient is ok with this.

## 2013-12-06 ENCOUNTER — Encounter: Payer: Self-pay | Admitting: Family

## 2013-12-06 ENCOUNTER — Ambulatory Visit (INDEPENDENT_AMBULATORY_CARE_PROVIDER_SITE_OTHER): Payer: Medicare Other | Admitting: Family

## 2013-12-06 VITALS — BP 154/78 | HR 58 | Temp 98.7°F | Ht 72.0 in | Wt 215.0 lb

## 2013-12-06 DIAGNOSIS — M5442 Lumbago with sciatica, left side: Secondary | ICD-10-CM

## 2013-12-06 DIAGNOSIS — I2510 Atherosclerotic heart disease of native coronary artery without angina pectoris: Secondary | ICD-10-CM | POA: Diagnosis not present

## 2013-12-06 MED ORDER — CYCLOBENZAPRINE HCL 5 MG PO TABS: 5.0000 mg | ORAL_TABLET | Freq: Three times a day (TID) | ORAL | Status: DC | PRN

## 2013-12-06 MED ORDER — KETOROLAC TROMETHAMINE 30 MG/ML IJ SOLN
30.0000 mg | Freq: Once | INTRAMUSCULAR | Status: AC
  Administered 2013-12-06: 30 mg via INTRAMUSCULAR

## 2013-12-06 MED ORDER — METHYLPREDNISOLONE (PAK) 4 MG PO TABS: ORAL_TABLET | ORAL | Status: DC

## 2013-12-06 NOTE — Patient Instructions (Signed)
Sciatica Sciatica is pain, weakness, numbness, or tingling along the path of the sciatic nerve. The nerve starts in the lower back and runs down the back of each leg. The nerve controls the muscles in the lower leg and in the back of the knee, while also providing sensation to the back of the thigh, lower leg, and the sole of your foot. Sciatica is a symptom of another medical condition. For instance, nerve damage or certain conditions, such as a herniated disk or bone spur on the spine, pinch or put pressure on the sciatic nerve. This causes the pain, weakness, or other sensations normally associated with sciatica. Generally, sciatica only affects one side of the body. CAUSES   Herniated or slipped disc.  Degenerative disk disease.  A pain disorder involving the narrow muscle in the buttocks (piriformis syndrome).  Pelvic injury or fracture.  Pregnancy.  Tumor (rare). SYMPTOMS  Symptoms can vary from mild to very severe. The symptoms usually travel from the low back to the buttocks and down the back of the leg. Symptoms can include:  Mild tingling or dull aches in the lower back, leg, or hip.  Numbness in the back of the calf or sole of the foot.  Burning sensations in the lower back, leg, or hip.  Sharp pains in the lower back, leg, or hip.  Leg weakness.  Severe back pain inhibiting movement. These symptoms may get worse with coughing, sneezing, laughing, or prolonged sitting or standing. Also, being overweight may worsen symptoms. DIAGNOSIS  Your caregiver will perform a physical exam to look for common symptoms of sciatica. He or she may ask you to do certain movements or activities that would trigger sciatic nerve pain. Other tests may be performed to find the cause of the sciatica. These may include:  Blood tests.  X-rays.  Imaging tests, such as an MRI or CT scan. TREATMENT  Treatment is directed at the cause of the sciatic pain. Sometimes, treatment is not necessary  and the pain and discomfort goes away on its own. If treatment is needed, your caregiver may suggest:  Over-the-counter medicines to relieve pain.  Prescription medicines, such as anti-inflammatory medicine, muscle relaxants, or narcotics.  Applying heat or ice to the painful area.  Steroid injections to lessen pain, irritation, and inflammation around the nerve.  Reducing activity during periods of pain.  Exercising and stretching to strengthen your abdomen and improve flexibility of your spine. Your caregiver may suggest losing weight if the extra weight makes the back pain worse.  Physical therapy.  Surgery to eliminate what is pressing or pinching the nerve, such as a bone spur or part of a herniated disk. HOME CARE INSTRUCTIONS   Only take over-the-counter or prescription medicines for pain or discomfort as directed by your caregiver.  Apply ice to the affected area for 20 minutes, 3-4 times a day for the first 48-72 hours. Then try heat in the same way.  Exercise, stretch, or perform your usual activities if these do not aggravate your pain.  Attend physical therapy sessions as directed by your caregiver.  Keep all follow-up appointments as directed by your caregiver.  Do not wear high heels or shoes that do not provide proper support.  Check your mattress to see if it is too soft. A firm mattress may lessen your pain and discomfort. SEEK IMMEDIATE MEDICAL CARE IF:   You lose control of your bowel or bladder (incontinence).  You have increasing weakness in the lower back, pelvis, buttocks,   or legs.  You have redness or swelling of your back.  You have a burning sensation when you urinate.  You have pain that gets worse when you lie down or awakens you at night.  Your pain is worse than you have experienced in the past.  Your pain is lasting longer than 4 weeks.  You are suddenly losing weight without reason. MAKE SURE YOU:  Understand these  instructions.  Will watch your condition.  Will get help right away if you are not doing well or get worse. Document Released: 02/12/2001 Document Revised: 08/20/2011 Document Reviewed: 06/30/2011 ExitCare Patient Information 2015 [employer]. This information is not intended to replace advice given to you by your health care provider. Make sure you discuss any questions you have with your health care provider.  

## 2013-12-06 NOTE — Progress Notes (Signed)
   Subjective:    Patient ID: [name] [name], male    DOB: February 24, 1941, 73 y.o.   MRN: [id number]  Back Pain This is a recurrent problem. The current episode started more than 1 year ago. The problem occurs intermittently. The problem has been gradually improving since onset. The pain is present in the gluteal. The quality of the pain is described as aching and shooting. The pain radiates to the left thigh and left knee. The pain is at a severity of 7/10. The pain is moderate. The pain is worse during the night. The symptoms are aggravated by lying down. Stiffness is present at night. Associated symptoms include leg pain. Pertinent negatives include no bladder incontinence, bowel incontinence, dysuria, numbness, tingling or weakness. He has tried bed rest, heat, ice and NSAIDs for the symptoms. The treatment provided mild relief.      Review of Systems  Constitutional: Negative.   HENT: Negative.   Respiratory: Negative.   Cardiovascular: Negative.   Gastrointestinal: Negative.  Negative for bowel incontinence.  Endocrine: Negative.   Genitourinary: Negative.  Negative for bladder incontinence and dysuria.  Musculoskeletal: Positive for back pain.  Neurological: Negative.  Negative for tingling, weakness and numbness.  Hematological: Negative.   Psychiatric/Behavioral: Negative.   All other systems reviewed and are negative.      Objective:   Physical Exam  Vitals reviewed. Constitutional: He is oriented to person, place, and time. He appears well-developed and well-nourished. No distress.  HENT:  Head: Normocephalic.  Right Ear: External ear normal.  Left Ear: External ear normal.  Nose: Nose normal.  Mouth/Throat: Oropharynx is clear and moist.  Eyes: Pupils are equal, round, and reactive to light. Right eye exhibits no discharge. Left eye exhibits no discharge.  Neck: Normal range of motion. Neck supple. No thyromegaly present.  Cardiovascular: Normal rate, regular rhythm,  normal heart sounds and intact distal pulses.   No murmur heard. Pulmonary/Chest: Effort normal and breath sounds normal. No respiratory distress. He has no wheezes.  Abdominal: Soft. Bowel sounds are normal. He exhibits no distension. There is no tenderness.  Musculoskeletal: Normal range of motion. He exhibits no edema and no tenderness.  Full ROM but slow movements r/t to pain   Neurological: He is alert and oriented to person, place, and time. He has normal reflexes. No cranial nerve deficit.  Skin: Skin is warm and dry. No rash noted. No erythema.  Psychiatric: He has a normal mood and affect. His behavior is normal. Judgment and thought content normal.   BP 154/78  Pulse 58  Temp(Src) 98.7 F (37.1 C) (Oral)  Ht 6' (1.829 m)  Wt 215 lb (97.523 kg)  BMI 29.15 kg/m2        Assessment & Plan:  1. Left-sided low back pain with left-sided sciatica -Rest -Ice and heat - ketorolac (TORADOL) 30 MG/ML injection 30 mg; Inject 1 mL (30 mg total) into the muscle once. - cyclobenzaprine (FLEXERIL) 5 MG tablet; Take 1 tablet (5 mg total) by mouth 3 (three) times daily as needed for muscle spasms.  Dispense: 30 tablet; Refill: 1 - methylPREDNIsolone (MEDROL DOSPACK) 4 MG tablet; follow package directions  Dispense: 21 tablet; Refill: 0  Evelina Dun, FNP

## 2013-12-17 ENCOUNTER — Other Ambulatory Visit: Payer: Self-pay

## 2013-12-30 ENCOUNTER — Other Ambulatory Visit (INDEPENDENT_AMBULATORY_CARE_PROVIDER_SITE_OTHER): Payer: Medicare Other

## 2013-12-30 ENCOUNTER — Ambulatory Visit (INDEPENDENT_AMBULATORY_CARE_PROVIDER_SITE_OTHER): Payer: Medicare Other

## 2013-12-30 ENCOUNTER — Encounter: Payer: Self-pay | Admitting: Family Medicine

## 2013-12-30 DIAGNOSIS — M791 Myalgia, unspecified site: Secondary | ICD-10-CM

## 2013-12-30 DIAGNOSIS — I10 Essential (primary) hypertension: Secondary | ICD-10-CM

## 2013-12-30 DIAGNOSIS — E559 Vitamin D deficiency, unspecified: Secondary | ICD-10-CM | POA: Diagnosis not present

## 2013-12-30 DIAGNOSIS — Z23 Encounter for immunization: Secondary | ICD-10-CM | POA: Diagnosis not present

## 2013-12-30 DIAGNOSIS — E785 Hyperlipidemia, unspecified: Secondary | ICD-10-CM | POA: Diagnosis not present

## 2013-12-30 DIAGNOSIS — D696 Thrombocytopenia, unspecified: Secondary | ICD-10-CM | POA: Insufficient documentation

## 2013-12-30 LAB — POCT CBC
Granulocyte percent: 60.4 %G (ref 37–80)
HEMATOCRIT: 45.9 % (ref 43.5–53.7)
Hemoglobin: 14.6 g/dL (ref 14.1–18.1)
Lymph, poc: 1.8 (ref 0.6–3.4)
MCH, POC: 29.5 pg (ref 27–31.2)
MCHC: 31.9 g/dL (ref 31.8–35.4)
MCV: 92.6 fL (ref 80–97)
MPV: 9.8 fL (ref 0–99.8)
POC GRANULOCYTE: 3.1 (ref 2–6.9)
POC LYMPH %: 35 % (ref 10–50)
Platelet Count, POC: 122 10*3/uL — AB (ref 142–424)
RBC: 5 M/uL (ref 4.69–6.13)
RDW, POC: 14.8 %
WBC: 5.1 10*3/uL (ref 4.6–10.2)

## 2013-12-31 ENCOUNTER — Telehealth: Payer: Self-pay | Admitting: *Deleted

## 2013-12-31 LAB — HEPATIC FUNCTION PANEL
ALK PHOS: 61 IU/L (ref 39–117)
ALT: 19 IU/L (ref 0–44)
AST: 21 IU/L (ref 0–40)
Albumin: 4.4 g/dL (ref 3.5–4.8)
BILIRUBIN DIRECT: 0.19 mg/dL (ref 0.00–0.40)
BILIRUBIN TOTAL: 0.8 mg/dL (ref 0.0–1.2)
TOTAL PROTEIN: 6.4 g/dL (ref 6.0–8.5)

## 2013-12-31 LAB — BMP8+EGFR
BUN/Creatinine Ratio: 21 (ref 10–22)
BUN: 19 mg/dL (ref 8–27)
CO2: 26 mmol/L (ref 18–29)
Calcium: 9.2 mg/dL (ref 8.6–10.2)
Chloride: 104 mmol/L (ref 97–108)
Creatinine, Ser: 0.89 mg/dL (ref 0.76–1.27)
GFR, EST AFRICAN AMERICAN: 99 mL/min/{1.73_m2} (ref >59)
GFR, EST NON AFRICAN AMERICAN: 86 mL/min/{1.73_m2} (ref >59)
Glucose: 95 mg/dL (ref 65–99)
POTASSIUM: 4.6 mmol/L (ref 3.5–5.2)
SODIUM: 144 mmol/L (ref 134–144)

## 2013-12-31 LAB — NMR, LIPOPROFILE
Cholesterol: 150 mg/dL (ref 100–199)
HDL Cholesterol by NMR: 53 mg/dL (ref >39)
HDL Particle Number: 31.6 umol/L (ref >=30.5)
LDL PARTICLE NUMBER: 1152 nmol/L — AB (ref <1000)
LDL Size: 21 nm (ref >20.5)
LDL-C: 81 mg/dL (ref 0–99)
LP-IR Score: <25 (ref <=45)
Small LDL Particle Number: 522 nmol/L (ref <=527)
TRIGLYCERIDES BY NMR: 81 mg/dL (ref 0–149)

## 2013-12-31 LAB — VITAMIN D 25 HYDROXY (VIT D DEFICIENCY, FRACTURES): Vit D, 25-Hydroxy: 61.8 ng/mL (ref 30.0–100.0)

## 2013-12-31 LAB — CK: CK TOTAL: 114 U/L (ref 24–204)

## 2013-12-31 NOTE — Telephone Encounter (Signed)
Message copied by Priscille Heidelberg on Fri Dec 31, 2013  9:32 AM ------      Message from: Chipper Herb      Created: Fri Dec 31, 2013  8:20 AM       The CK is normal. It is much improved and lower than 4 months ago.      The total LDL particle number is greatly improved though still not at goal. It is now 1152. It was 1884. The LDL C is close to goal of less than 75. The LDL C value is 81. The triglycerides are good.----- please confirm his current cholesterol treatment regimen?? Continue aggressive therapeutic lifestyle changes      The blood sugar, kidney function tests including creatinine and potassium are all within normal limits      The vitamin D level is within normal limits, continue current treatment ------

## 2014-01-04 ENCOUNTER — Ambulatory Visit (INDEPENDENT_AMBULATORY_CARE_PROVIDER_SITE_OTHER): Payer: Medicare Other | Admitting: Cardiology

## 2014-01-04 ENCOUNTER — Encounter: Payer: Self-pay | Admitting: Cardiology

## 2014-01-04 VITALS — BP 140/78 | HR 59 | Ht 72.0 in | Wt 213.0 lb

## 2014-01-04 DIAGNOSIS — I493 Ventricular premature depolarization: Secondary | ICD-10-CM | POA: Diagnosis not present

## 2014-01-04 DIAGNOSIS — I2510 Atherosclerotic heart disease of native coronary artery without angina pectoris: Secondary | ICD-10-CM | POA: Diagnosis not present

## 2014-01-04 DIAGNOSIS — I10 Essential (primary) hypertension: Secondary | ICD-10-CM

## 2014-01-04 NOTE — Patient Instructions (Signed)
Your physician wants you to follow-up in: [place] will receive a reminder letter in the mail two months in advance. If you don't receive a letter, please call our office to schedule the follow-up appointment.   Your physician has requested that you have an exercise tolerance test. For further information please visit [email or web address]. Please also follow instruction sheet, as given.    Exercise Stress Electrocardiogram An exercise stress electrocardiogram is a test to check how blood flows to your heart. It is done to find areas of poor blood flow. You will need to walk on a treadmill for this test. The electrocardiogram will record your heartbeat when you are at rest and when you are exercising. BEFORE THE PROCEDURE  Do not have drinks with caffeine or foods with caffeine for 24 hours before the test, or as told by your doctor. This includes coffee, tea (even decaf tea), sodas, chocolate, and cocoa.  Follow your doctor's instructions about eating and drinking before the test.  Ask your doctor what medicines you should or should not take before the test. Take your medicines with water unless told by your doctor not to.  If you use an inhaler, bring it with you to the test.  Bring a snack to eat after the test.  Do not  smoke for 4 hours before the test.  Do not put lotions, powders, creams, or oils on your chest before the test.  Wear comfortable shoes and clothing. PROCEDURE  You will have patches put on your chest. Small areas of your chest may need to be shaved. Wires will be connected to the patches.  Your heart rate will be watched while you are resting and while you are exercising.  You will walk on the treadmill. The treadmill will slowly get faster to raise your heart rate.  The test will take about 1-2 hours. AFTER THE PROCEDURE  Your heart rate and blood pressure will be watched after the test.  You may return to your normal diet, activities, and  medicines or as told by your doctor. Document Released: 08/07/2007 Document Revised: 07/05/2013 Document Reviewed: 10/26/2012 [facility] Patient Information 2015 [place], [place]. This information is not intended to replace advice given to you by your health care provider. Make sure you discuss any questions you have with your health care provider.

## 2014-01-04 NOTE — Progress Notes (Signed)
HPI The patient presents for follow up of CAD.  Since I last saw him he has been doing relatively well. He golfs 3 times per week and he does some walking.    The patient denies any new symptoms such as chest discomfort, neck or arm discomfort. There has been no new shortness of breath, PND or orthopnea. There have been no reported palpitations, presyncope or syncope.   He has however had some pain in between his shoulder blades which happens sporadically. This might happen with activity. This is not the same as his previous angina. He denies any associated symptoms such as nausea vomiting or shortness of breath. He's not having any palpitations, presyncope or syncope. He did have one episode of left shoulder pain again not like his previous angina.  Allergies  Allergen Reactions  . Levofloxacin     unknown  . Crestor [Rosuvastatin]     unknown  . Vytorin [Ezetimibe-Simvastatin]     unknown    Current Outpatient Prescriptions  Medication Sig Dispense Refill  . aspirin 81 MG tablet Take 1 tablet twice a day    . Cholecalciferol (VITAMIN D3) 5000 UNITS CAPS Take 1 capsule by mouth daily.    . Coenzyme Q10 (COQ10) 100 MG CAPS Take 1 capsule by mouth 2 (two) times daily.      . cyclobenzaprine (FLEXERIL) 5 MG tablet Take 1 tablet (5 mg total) by mouth 3 (three) times daily as needed for muscle spasms. 30 tablet 1  . fluticasone (FLONASE) 50 MCG/ACT nasal spray USE 2 SPRAYS IN EACH NOSTRIL ONCE DAILY. 16 g 5  . HYDROcodone-acetaminophen (NORCO) 5-325 MG per tablet Take 1 tablet by mouth every 6 (six) hours as needed for moderate pain. 30 tablet 0  . meloxicam (MOBIC) 15 MG tablet TAKE (1) TABLET DAILY AS DIRECTED. 30 tablet 0  . metoprolol tartrate (LOPRESSOR) 25 MG tablet Take 1 tab twice a day    . Misc Natural Products (OSTEO BI-FLEX ADV JOINT SHIELD PO) Take by mouth as directed.    . Multiple Vitamin (MULTI VITAMIN MENS PO) Take 1 tablet by mouth 2 (two) times daily.    . Omega-3 Fatty  Acids (FISH OIL) 1000 MG CAPS Take 2 capsules by mouth daily. 1000 mg?     . sildenafil (VIAGRA) 100 MG tablet Take 0.5-1 tablets (50-100 mg total) by mouth daily as needed for erectile dysfunction. 6 tablet 11  . vardenafil (LEVITRA) 20 MG tablet Take 1 tablet (20 mg total) by mouth daily as needed for erectile dysfunction. 6 tablet 1   No current facility-administered medications for this visit.    Past Medical History  Diagnosis Date  . CAD (coronary artery disease)   . Dyslipidemia   . Colon polyps   . Hiatal hernia   . Herniated disc   . HTN (hypertension)   . Hyperlipidemia   . Kidney stones     Past Surgical History  Procedure Laterality Date  . Mole removed  2003  . Right knee surgery  2008  . Tonsillectomy    . Coronary artery bypass graft  2006    LIMA to LAD, SVG to RCA, SVG to circumflex, SVG to diagonal     ROS:  Sinus trouble, right knee pain.  Epigastric discomfort.  Otherwise as stated in the HPI and negative for all other systems.  PHYSICAL EXAM BP 140/78 mmHg  Pulse 59  Ht 6' (1.829 m)  Wt 213 lb (96.616 kg)  BMI 28.88 kg/m2  SpO2 98% GENERAL:  Well appearing NECK:  No jugular venous distention, waveform within normal limits, carotid upstroke brisk and symmetric, no bruits, no thyromegaly LUNGS:  Clear to auscultation bilaterally BACK:  No CVA tenderness CHEST:  Well healed sternotomy scar. HEART:  PMI not displaced or sustained,S1 and S2 within normal limits, no S3, no S4, no clicks, no rubs, no murmurs ABD:  Flat, positive bowel sounds normal in frequency in pitch, no bruits, no rebound, no guarding, no midline pulsatile mass, no hepatomegaly, no splenomegaly EXT:  2 plus pulses throughout, mild ankle edema, no cyanosis no clubbing   EKG:  Sinus rhythm, rate 57, axis within normal limits, intervals within normal limits, no acute ST-T wave changes.  01/04/2014   ASSESSMENT AND PLAN  CAD:  He does have some mild  back discomfort.   Given this I  would like to bring him back for a POET (Plain Old Exercise Treadmill).   He will otherwise continue with risk reduction.  HTN:  The blood pressure is at the upper limits of acceptable and has been lower on previous readings.  DYSLIPIDEMIA:   He has had a difficult time tolerating statins but currently is back on 10 mg of Lipitor. I reviewed his Lipomed profile.   His lipids are near target. I also reviewed the results of the neurology evaluation was suggested possible radiculopathy.   He very much wants to remain on a statin although this might be causing some of his muscle aches.

## 2014-01-11 ENCOUNTER — Ambulatory Visit: Payer: Medicare Other | Admitting: Family Medicine

## 2014-01-12 ENCOUNTER — Encounter: Payer: Self-pay | Admitting: Family Medicine

## 2014-01-12 ENCOUNTER — Ambulatory Visit (INDEPENDENT_AMBULATORY_CARE_PROVIDER_SITE_OTHER): Payer: Medicare Other | Admitting: Family Medicine

## 2014-01-12 ENCOUNTER — Telehealth: Payer: Self-pay | Admitting: Family Medicine

## 2014-01-12 VITALS — BP 122/79 | HR 68 | Temp 97.4°F | Ht 72.0 in | Wt 210.0 lb

## 2014-01-12 DIAGNOSIS — I2510 Atherosclerotic heart disease of native coronary artery without angina pectoris: Secondary | ICD-10-CM | POA: Diagnosis not present

## 2014-01-12 DIAGNOSIS — N400 Enlarged prostate without lower urinary tract symptoms: Secondary | ICD-10-CM | POA: Diagnosis not present

## 2014-01-12 DIAGNOSIS — R0683 Snoring: Secondary | ICD-10-CM

## 2014-01-12 DIAGNOSIS — Z23 Encounter for immunization: Secondary | ICD-10-CM

## 2014-01-12 DIAGNOSIS — E559 Vitamin D deficiency, unspecified: Secondary | ICD-10-CM

## 2014-01-12 DIAGNOSIS — M791 Myalgia, unspecified site: Secondary | ICD-10-CM

## 2014-01-12 DIAGNOSIS — E785 Hyperlipidemia, unspecified: Secondary | ICD-10-CM

## 2014-01-12 DIAGNOSIS — Z8052 Family history of malignant neoplasm of bladder: Secondary | ICD-10-CM

## 2014-01-12 DIAGNOSIS — N5201 Erectile dysfunction due to arterial insufficiency: Secondary | ICD-10-CM | POA: Diagnosis not present

## 2014-01-12 DIAGNOSIS — R300 Dysuria: Secondary | ICD-10-CM | POA: Diagnosis not present

## 2014-01-12 DIAGNOSIS — R748 Abnormal levels of other serum enzymes: Secondary | ICD-10-CM

## 2014-01-12 DIAGNOSIS — Z79899 Other long term (current) drug therapy: Secondary | ICD-10-CM

## 2014-01-12 DIAGNOSIS — D696 Thrombocytopenia, unspecified: Secondary | ICD-10-CM

## 2014-01-12 LAB — POCT UA - MICROSCOPIC ONLY
BACTERIA, U MICROSCOPIC: NEGATIVE
CASTS, UR, LPF, POC: NEGATIVE
CRYSTALS, UR, HPF, POC: NEGATIVE
MUCUS UA: NEGATIVE
WBC, Ur, HPF, POC: NEGATIVE
Yeast, UA: NEGATIVE

## 2014-01-12 LAB — POCT URINALYSIS DIPSTICK
Bilirubin, UA: NEGATIVE
GLUCOSE UA: NEGATIVE
KETONES UA: NEGATIVE
Leukocytes, UA: NEGATIVE
NITRITE UA: NEGATIVE
Protein, UA: NEGATIVE
SPEC GRAV UA: 1.01
Urobilinogen, UA: NEGATIVE
pH, UA: 6

## 2014-01-12 MED ORDER — ATORVASTATIN CALCIUM 20 MG PO TABS: 20.0000 mg | ORAL_TABLET | Freq: Every day | ORAL | Status: DC

## 2014-01-12 NOTE — Addendum Note (Signed)
Addended by: Zannie Cove on: 01/12/2014 09:32 AM   Modules accepted: Orders

## 2014-01-12 NOTE — Telephone Encounter (Signed)
Stp, he was confused about what clean catch midstream meant, reviewed the proper technique for a clean catch urine sample, pt voiced understanding, will close call

## 2014-01-12 NOTE — Telephone Encounter (Signed)
Urine looks ok but will need to come back in a week and do a clean catch specimen again.

## 2014-01-12 NOTE — Progress Notes (Signed)
Subjective:    Patient ID: [name] [name], male    DOB: 06/24/1940, 73 y.o.   MRN: [id number]  HPI Pt here for follow up and management of chronic medical problems. The patient continues to have some myalgias in his legs. He also has snoring which bothers his spouse.he also complains of occasional abdominal pain. His recent lab work will be reviewed with him today.recent labs will be reviewed with the patient today. He saw the cardiologist and an exercise stress test is being planned for next week.         Patient Active Problem List   Diagnosis Date Noted  . Thrombocytopenia 12/30/2013  . B12 deficiency 10/01/2012  . Allergic rhinitis 10/01/2012  . BPH (benign prostatic hyperplasia) 10/01/2012  . PVC's (premature ventricular contractions) 09/01/2009  . COLONIC POLYPS 04/14/2009  . Hyperlipidemia 04/14/2009  . Essential hypertension 04/14/2009  . Coronary atherosclerosis 04/14/2009  . HIATAL HERNIA 04/14/2009  . HERNIATED DISC 04/14/2009   Outpatient Encounter Prescriptions as of 01/12/2014  Medication Sig  . aspirin 81 MG tablet Take 1 tablet twice a day  . atorvastatin (LIPITOR) 10 MG tablet Take 10 mg by mouth daily.  . Cholecalciferol (VITAMIN D3) 5000 UNITS CAPS Take 1 capsule by mouth daily.  . Coenzyme Q10 (COQ10) 100 MG CAPS Take 1 capsule by mouth 2 (two) times daily.    . cyclobenzaprine (FLEXERIL) 5 MG tablet Take 1 tablet (5 mg total) by mouth 3 (three) times daily as needed for muscle spasms.  . fluticasone (FLONASE) 50 MCG/ACT nasal spray USE 2 SPRAYS IN EACH NOSTRIL ONCE DAILY.  . meloxicam (MOBIC) 15 MG tablet TAKE (1) TABLET DAILY AS DIRECTED.  Marland Kitchen metoprolol tartrate (LOPRESSOR) 25 MG tablet Take 1 tab twice a day  . Misc Natural Products (OSTEO BI-FLEX ADV JOINT SHIELD PO) Take by mouth as directed.  . Multiple Vitamin (MULTI VITAMIN MENS PO) Take 1 tablet by mouth 2 (two) times daily.  . Omega-3 Fatty Acids (FISH OIL) 1000 MG CAPS Take 2 capsules by  mouth daily. 1000 mg?   . [DISCONTINUED] HYDROcodone-acetaminophen (NORCO) 5-325 MG per tablet Take 1 tablet by mouth every 6 (six) hours as needed for moderate pain.  . sildenafil (VIAGRA) 100 MG tablet Take 0.5-1 tablets (50-100 mg total) by mouth daily as needed for erectile dysfunction.  . vardenafil (LEVITRA) 20 MG tablet Take 1 tablet (20 mg total) by mouth daily as needed for erectile dysfunction.    Review of Systems  Constitutional: Negative.   HENT: Negative.   Eyes: Negative.   Respiratory: Negative.        Snoring  Cardiovascular: Negative.   Gastrointestinal: Positive for abdominal pain (slight pains at times).  Endocrine: Negative.   Genitourinary: Negative.   Musculoskeletal: Positive for myalgias (bilateral legs).  Skin: Negative.   Allergic/Immunologic: Negative.   Neurological: Negative.   Hematological: Negative.   Psychiatric/Behavioral: Negative.        Objective:   Physical Exam  Constitutional: He is oriented to person, place, and time. He appears well-developed and well-nourished. No distress.  HENT:  Head: Normocephalic and atraumatic.  Right Ear: External ear normal.  Left Ear: External ear normal.  Nose: Nose normal.  Mouth/Throat: Oropharynx is clear and moist. No oropharyngeal exudate.  Eyes: Conjunctivae and EOM are normal. Pupils are equal, round, and reactive to light. Right eye exhibits no discharge. Left eye exhibits no discharge. No scleral icterus.  Neck: Normal range of motion. Neck supple. No thyromegaly present.  No  carotid bruits  Cardiovascular: Normal rate, regular rhythm, normal heart sounds and intact distal pulses.  Exam reveals no gallop and no friction rub.   No murmur heard. The rhythm was slightly irregular and then became regular. The rate was 72/m.  Pulmonary/Chest: Effort normal and breath sounds normal. No respiratory distress. He has no wheezes. He has no rales. He exhibits no tenderness.  No axillary adenopathy    Abdominal: Soft. Bowel sounds are normal. He exhibits no mass. There is no tenderness. There is no rebound and no guarding.  No inguinal adenopathy  Musculoskeletal: Normal range of motion. He exhibits no edema or tenderness.  Lymphadenopathy:    He has no cervical adenopathy.  Neurological: He is alert and oriented to person, place, and time. He has normal reflexes. No cranial nerve deficit.  Skin: Skin is warm and dry. No rash noted. No erythema. No pallor.  Psychiatric: He has a normal mood and affect. His behavior is normal. Judgment and thought content normal.  Nursing note and vitals reviewed.   BP 122/79 mmHg  Pulse 68  Temp(Src) 97.4 F (36.3 C) (Oral)  Ht 6' (1.829 m)  Wt 210 lb (95.255 kg)  BMI 28.47 kg/m2       Assessment & Plan:  1. BPH (benign prostatic hyperplasia)  2. Elevated CK  3. High risk medication use  4. Vitamin D deficiency  5. Hyperlipemia -consider one of the newer injectable agents if the LDL C remains high  6. Erectile dysfunction due to arterial insufficiency  7. Thrombocytopenia  8. Need for pneumococcal vaccination - Pneumococcal conjugate vaccine 13-valent IM  9. Myalgia -we will continue to monitor this  Meds ordered this encounter  Medications  . omeprazole (PRILOSEC) 20 MG capsule    Sig: Take 20 mg by mouth daily.   Patient Instructions                       Medicare Annual Wellness Visit  [facility] and the medical providers at [facility] strive to bring you the best medical care.  In doing so we not only want to address your current medical conditions and concerns but also to detect new conditions early and prevent illness, disease and health-related problems.    Medicare offers a yearly Wellness Visit which allows our clinical staff to assess your need for preventative services including immunizations, lifestyle education, counseling to decrease risk of preventable diseases and screening for fall  risk and other medical concerns.    This visit is provided free of charge (no copay) for all Medicare recipients. The clinical pharmacists at [facility] have begun to conduct these Wellness Visits which will also include a thorough review of all your medications.    As you primary medical provider recommend that you make an appointment for your Annual Wellness Visit if you have not done so already this year.  You may set up this appointment before you leave today or you may call back ([phone]) and schedule an appointment.  Please make sure when you call that you mention that you are scheduling your Annual Wellness Visit with the clinical pharmacist so that the appointment may be made for the proper length of time.      Continue current medications. Continue good therapeutic lifestyle changes which include good diet and exercise. Fall precautions discussed with patient. If an FOBT was given today- please return it to our front desk. If you are over 71 years  old - you may need Prevnar 13 or the adult Pneumonia vaccine.  Flu Shots will be available at our office starting mid- September. Please call and schedule a FLU CLINIC APPOINTMENT.   Continue to drink plenty of fluids You may consider trying Zantac 150 twice daily and holding the Prilosec for a couple weeks to see if this makes any difference with her stomach pain.if problems continue with the stoma pain, consider trying a probiotic over-the-counter. The Prevnar vaccine and did you receive today may make her arm sore. Avoid milk cheese ice cream and dairy products for the next few days and eat a more bland diet Review the information about the new injectable medicine for lowering cholesterol Because of the persistent snoring, we will arrange for a sleep apnea evaluation Continue with atorvastatin as doing Make sure that you get a CK at the next blood draw   Arrie Senate MD

## 2014-01-12 NOTE — Patient Instructions (Addendum)
Medicare Annual Wellness Visit  [facility] and the medical providers at [facility] strive to bring you the best medical care.  In doing so we not only want to address your current medical conditions and concerns but also to detect new conditions early and prevent illness, disease and health-related problems.    Medicare offers a yearly Wellness Visit which allows our clinical staff to assess your need for preventative services including immunizations, lifestyle education, counseling to decrease risk of preventable diseases and screening for fall risk and other medical concerns.    This visit is provided free of charge (no copay) for all Medicare recipients. The clinical pharmacists at [facility] have begun to conduct these Wellness Visits which will also include a thorough review of all your medications.    As you primary medical provider recommend that you make an appointment for your Annual Wellness Visit if you have not done so already this year.  You may set up this appointment before you leave today or you may call back ([phone]) and schedule an appointment.  Please make sure when you call that you mention that you are scheduling your Annual Wellness Visit with the clinical pharmacist so that the appointment may be made for the proper length of time.      Continue current medications. Continue good therapeutic lifestyle changes which include good diet and exercise. Fall precautions discussed with patient. If an FOBT was given today- please return it to our front desk. If you are over 70 years old - you may need Prevnar 39 or the adult Pneumonia vaccine.  Flu Shots will be available at our office starting mid- September. Please call and schedule a FLU CLINIC APPOINTMENT.   Continue to drink plenty of fluids You may consider trying Zantac 150 twice daily and holding the Prilosec for a couple weeks to see if this makes any  difference with her stomach pain.if problems continue with the stoma pain, consider trying a probiotic over-the-counter. The Prevnar vaccine and did you receive today may make her arm sore. Avoid milk cheese ice cream and dairy products for the next few days and eat a more bland diet Review the information about the new injectable medicine for lowering cholesterol Because of the persistent snoring, we will arrange for a sleep apnea evaluation Continue with atorvastatin as doing Make sure that you get a CK at the next blood draw

## 2014-01-12 NOTE — Addendum Note (Signed)
Addended by: Zannie Cove on: 01/12/2014 09:09 AM   Modules accepted: Orders

## 2014-01-14 LAB — URINE CULTURE: Organism ID, Bacteria: NO GROWTH

## 2014-01-19 ENCOUNTER — Telehealth (HOSPITAL_COMMUNITY): Payer: Self-pay

## 2014-01-19 NOTE — Telephone Encounter (Signed)
Encounter complete. 

## 2014-01-21 ENCOUNTER — Ambulatory Visit (HOSPITAL_COMMUNITY)
Admission: RE | Admit: 2014-01-21 | Discharge: 2014-01-21 | Disposition: A | Discharge status: Home / Self Care | Payer: Medicare Other | Source: Ambulatory Visit | Attending: Cardiology | Admitting: Cardiology

## 2014-01-21 ENCOUNTER — Other Ambulatory Visit (INDEPENDENT_AMBULATORY_CARE_PROVIDER_SITE_OTHER): Payer: Medicare Other

## 2014-01-21 DIAGNOSIS — I493 Ventricular premature depolarization: Secondary | ICD-10-CM

## 2014-01-21 DIAGNOSIS — R319 Hematuria, unspecified: Secondary | ICD-10-CM

## 2014-01-21 DIAGNOSIS — I10 Essential (primary) hypertension: Secondary | ICD-10-CM

## 2014-01-21 DIAGNOSIS — I2510 Atherosclerotic heart disease of native coronary artery without angina pectoris: Secondary | ICD-10-CM | POA: Diagnosis not present

## 2014-01-21 LAB — POCT UA - MICROSCOPIC ONLY
Bacteria, U Microscopic: NEGATIVE
CASTS, UR, LPF, POC: NEGATIVE
Crystals, Ur, HPF, POC: NEGATIVE
MUCUS UA: NEGATIVE
RBC, urine, microscopic: NEGATIVE
WBC, UR, HPF, POC: NEGATIVE
Yeast, UA: NEGATIVE

## 2014-01-21 LAB — POCT URINALYSIS DIPSTICK
BILIRUBIN UA: NEGATIVE
Glucose, UA: NEGATIVE
Ketones, UA: NEGATIVE
Leukocytes, UA: NEGATIVE
Nitrite, UA: NEGATIVE
PH UA: 5
Protein, UA: NEGATIVE
RBC UA: NEGATIVE
Spec Grav, UA: 1.02
Urobilinogen, UA: NEGATIVE

## 2014-01-21 NOTE — Procedures (Signed)
Exercise Treadmill Test   Test  Exercise Tolerance Test Ordering MD: Marijo File, MD  Interpreting MD:   Unique Test No:1  Treadmill:  1  Indication for ETT: [place] Patency  Contraindication to ETT: No   Stress Modality: exercise - treadmill  Cardiac Imaging Performed: non   Protocol: standard Bruce - maximal  Max BP:  226/84  Max MPHR (bpm):  149 85% MPR (bpm):  126  MPHR obtained (bpm):  134 % MPHR obtained:  89  Reached 85% MPHR (min:sec): 7:30 Total Exercise Time (min-sec): 9:00  Workload in METS:  10.10 Borg Scale:   Reason ETT Terminated:  fatigue    ST Segment Analysis At Rest: NSR with first degree AV block. With Exercise: no evidence of significant ST depression  Other Information Arrhythmia:  Occasional PVC and couplet noted. Angina during ETT:  absent (0) Quality of ETT:  diagnostic  ETT Interpretation:  normal - no evidence of ischemia by ST analysis  Comments: ETT with good exercise tolerance (9:00); hypertensive BP response; no chest pain; no ST changes; negative adequate ETT.  Kirk Ruths

## 2014-01-21 NOTE — Progress Notes (Signed)
Lab only 

## 2014-02-03 DIAGNOSIS — H2513 Age-related nuclear cataract, bilateral: Secondary | ICD-10-CM | POA: Diagnosis not present

## 2014-03-17 ENCOUNTER — Ambulatory Visit (INDEPENDENT_AMBULATORY_CARE_PROVIDER_SITE_OTHER): Payer: Medicare Other | Admitting: Internal Medicine

## 2014-03-17 ENCOUNTER — Encounter: Payer: Self-pay | Admitting: Internal Medicine

## 2014-03-17 VITALS — BP 120/68 | HR 58 | Ht 73.0 in | Wt 215.2 lb

## 2014-03-17 DIAGNOSIS — G4733 Obstructive sleep apnea (adult) (pediatric): Secondary | ICD-10-CM | POA: Insufficient documentation

## 2014-03-17 NOTE — Assessment & Plan Note (Signed)
The question is whether he has obstructive sleep apnea or just simple snoring. We discussed the physiology and medical concerns. I compared unattended home sleep study with center based full study. I think in unattended study might give [place] for we need. Plan-schedule unattended sleep study.

## 2014-03-17 NOTE — Patient Instructions (Addendum)
Order- schedule unattended home sleep study    Dx OSA  Please call as needed

## 2014-03-17 NOTE — Progress Notes (Signed)
03/17/14- 89 yoM never smoker referred courtesy of Dr Rayna Sexton. No sleep study Medical problems include CAD, HBP, Hiatal hernia,  His wife has told him off-and-on that he snores loudly. Recently they put a humidifier in the bedroom and this seems to help. Bedtime 11:30 to 12:30 PM, sleep latency 15 minutes, waking once or twice before up at 8 AM. Weight stable. Father snored loudly. Brother has sleep apnea with CPAP. History of CABG 9 years ago without MI. No lung disease. Remote tonsillectomy  Prior to Admission medications   Medication Sig Start Date End Date Taking? Authorizing Provider  aspirin 81 MG tablet Take 1 tablet twice a day   Yes Historical Provider, MD  atorvastatin (LIPITOR) 10 MG tablet Take 10 mg by mouth daily.   Yes Historical Provider, MD  Cholecalciferol (VITAMIN D3) 5000 UNITS CAPS Take 1 capsule by mouth daily.   Yes Historical Provider, MD  Coenzyme Q10 (COQ10) 100 MG CAPS Take 1 capsule by mouth 2 (two) times daily.     Yes Historical Provider, MD  cyclobenzaprine (FLEXERIL) 5 MG tablet Take 1 tablet (5 mg total) by mouth 3 (three) times daily as needed for muscle spasms. 12/06/13  Yes Christy A Hawks, FNP  fluticasone (FLONASE) 50 MCG/ACT nasal spray USE 2 SPRAYS IN EACH NOSTRIL ONCE DAILY.   Yes Chipper Herb, MD  meloxicam (MOBIC) 15 MG tablet TAKE (1) TABLET DAILY AS DIRECTED. 11/16/13  Yes Chipper Herb, MD  metoprolol tartrate (LOPRESSOR) 25 MG tablet Take 1 tab twice a day 12/20/13  Yes Historical Provider, MD  Misc Natural Products (OSTEO BI-FLEX ADV JOINT SHIELD PO) Take 1 capsule by mouth 2 (two) times daily.    Yes Historical Provider, MD  Multiple Vitamin (MULTI VITAMIN MENS PO) Take 1 tablet by mouth 2 (two) times daily.   Yes Historical Provider, MD  Omega-3 Fatty Acids (FISH OIL) 1000 MG CAPS Take 2 capsules by mouth daily. 1000 mg?    Yes Historical Provider, MD  omeprazole (PRILOSEC) 20 MG capsule Take 20 mg by mouth daily.   Yes Historical  Provider, MD  sildenafil (VIAGRA) 100 MG tablet Take 0.5-1 tablets (50-100 mg total) by mouth daily as needed for erectile dysfunction. Patient not taking: Reported on 03/17/2014 08/17/13   Chipper Herb, MD   Past Medical History  Diagnosis Date  . CAD (coronary artery disease)   . Dyslipidemia   . Colon polyps   . Hiatal hernia   . Herniated disc   . HTN (hypertension)   . Hyperlipidemia   . Kidney stones    Past Surgical History  Procedure Laterality Date  . Mole removed  2003  . Right knee surgery  2008  . Tonsillectomy    . Coronary artery bypass graft  2006    LIMA to LAD, SVG to RCA, SVG to circumflex, SVG to diagonal    Family History  Problem Relation Age of Onset  . Heart disease Father   . Hypertension Mother   . Alzheimer's disease Mother   . Stroke Mother   . Heart disease Brother     stents  . Diabetes Brother     type 2  . Pneumonia Sister 50    deceased   History   Social History  . Marital Status: Married    Spouse Name: N/A    Number of Children: 2  . Years of Education: N/A   Occupational History  . Armed forces technical officer for [employer]  Social History Main Topics  . Smoking status: Never Smoker   . Smokeless tobacco: Not on file     Comment: does not smoke   . Alcohol Use: 0.0 oz/week    0 Not specified per week     Comment: with meal  . Drug Use: No  . Sexual Activity: Yes   Other Topics Concern  . Not on file   Social History Narrative   Married 37+ years, 2 adult children and one granddaughter. Engineer, building services at [employer].    ROS-see HPI Constitutional:   No-   weight loss, night sweats, fevers, chills, +fatigue, lassitude. HEENT:   No-  headaches, difficulty swallowing, tooth/dental problems, sore throat,       No-  sneezing, itching, ear ache, nasal congestion, post nasal drip,  CV:  No-   chest pain, orthopnea, PND, swelling in lower extremities, anasarca,                                  dizziness,  palpitations Resp: No-   shortness of breath with exertion or at rest.              No-   productive cough,  No non-productive cough,  No- coughing up of blood.              No-   change in color of mucus.  No- wheezing.   Skin: No-   rash or lesions. GI:  No-   heartburn, indigestion, abdominal pain, nausea, vomiting, diarrhea,                 change in bowel habits, loss of appetite GU: No-   dysuria, change in color of urine, no urgency or frequency.  No- flank pain. MS:  + joint pain or swelling.  No- decreased range of motion.  No- back pain. Neuro-     nothing unusual Psych:  No- change in mood or affect. No depression or anxiety.  No memory loss.  OBJ- Physical Exam General- Alert, Oriented, Affect-appropriate, Distress- none acute, tall Skin- rash-none, lesions- none, excoriation- none Lymphadenopathy- none Head- atraumatic            Eyes- Gross vision intact, PERRLA, conjunctivae and secretions clear            Ears- Hearing, canals-normal            Nose- Clear, no-Septal dev, mucus, polyps, erosion, perforation             Throat- Mallampati II , mucosa clear , drainage- none, tonsils- atrophic Neck- flexible , trachea midline, no stridor , thyroid nl, carotid no bruit Chest - symmetrical excursion , unlabored           Heart/CV- RRR , no murmur , no gallop  , no rub, nl s1 s2                           - JVD- none , edema- none, stasis changes- none, varices- none           Lung- clear to P&A, wheeze- none, cough- none , dullness-none, rub- none           Chest wall-  Abd- tender-no, distended-no, bowel sounds-present, HSM- no Br/ Gen/ Rectal- Not done, not indicated Extrem- cyanosis- none, clubbing, none, atrophy- none, strength- nl Neuro- grossly intact to observation

## 2014-03-24 ENCOUNTER — Ambulatory Visit (INDEPENDENT_AMBULATORY_CARE_PROVIDER_SITE_OTHER): Payer: Medicare Other | Admitting: Family Medicine

## 2014-03-24 ENCOUNTER — Encounter: Payer: Self-pay | Admitting: Family Medicine

## 2014-03-24 ENCOUNTER — Other Ambulatory Visit: Payer: Self-pay

## 2014-03-24 ENCOUNTER — Ambulatory Visit (HOSPITAL_COMMUNITY)
Admission: RE | Admit: 2014-03-24 | Discharge: 2014-03-24 | Disposition: A | Discharge status: Home / Self Care | Payer: Medicare Other | Source: Ambulatory Visit | Attending: Family Medicine | Admitting: Family Medicine

## 2014-03-24 ENCOUNTER — Telehealth: Payer: Self-pay | Admitting: Family Medicine

## 2014-03-24 ENCOUNTER — Telehealth: Payer: Self-pay | Admitting: *Deleted

## 2014-03-24 VITALS — BP 152/74 | HR 57 | Temp 97.9°F | Ht 73.0 in | Wt 211.0 lb

## 2014-03-24 DIAGNOSIS — R361 Hematospermia: Secondary | ICD-10-CM

## 2014-03-24 DIAGNOSIS — R319 Hematuria, unspecified: Secondary | ICD-10-CM

## 2014-03-24 DIAGNOSIS — R102 Pelvic and perineal pain: Secondary | ICD-10-CM | POA: Diagnosis not present

## 2014-03-24 DIAGNOSIS — Z87442 Personal history of urinary calculi: Secondary | ICD-10-CM | POA: Insufficient documentation

## 2014-03-24 DIAGNOSIS — R109 Unspecified abdominal pain: Secondary | ICD-10-CM | POA: Diagnosis not present

## 2014-03-24 DIAGNOSIS — R300 Dysuria: Secondary | ICD-10-CM

## 2014-03-24 LAB — POCT URINALYSIS DIPSTICK
Bilirubin, UA: NEGATIVE
Glucose, UA: NEGATIVE
KETONES UA: NEGATIVE
Leukocytes, UA: NEGATIVE
NITRITE UA: NEGATIVE
Protein, UA: NEGATIVE
Spec Grav, UA: 1.025
Urobilinogen, UA: NEGATIVE
pH, UA: 5

## 2014-03-24 LAB — POCT UA - MICROSCOPIC ONLY
Casts, Ur, LPF, POC: NEGATIVE
Crystals, Ur, HPF, POC: NEGATIVE
MUCUS UA: NEGATIVE
WBC, Ur, HPF, POC: NEGATIVE
Yeast, UA: NEGATIVE

## 2014-03-24 NOTE — Progress Notes (Signed)
Subjective:    Patient ID: [name] [name], male    DOB: October 25, 1940, 74 y.o.   MRN: [id number]  HPI Patient here today for hematuria, pelvic pressure and pain that started about 2 days ago.         Patient Active Problem List   Diagnosis Date Noted  . Obstructive sleep apnea 03/17/2014  . Erectile dysfunction due to arterial insufficiency 01/12/2014  . Thrombocytopenia 12/30/2013  . B12 deficiency 10/01/2012  . Allergic rhinitis 10/01/2012  . BPH (benign prostatic hyperplasia) 10/01/2012  . PVC's (premature ventricular contractions) 09/01/2009  . COLONIC POLYPS 04/14/2009  . Hyperlipidemia 04/14/2009  . Essential hypertension 04/14/2009  . Coronary atherosclerosis 04/14/2009  . HIATAL HERNIA 04/14/2009  . HERNIATED DISC 04/14/2009   Outpatient Encounter Prescriptions as of 03/24/2014  Medication Sig  . aspirin 81 MG tablet Take 1 tablet twice a day  . atorvastatin (LIPITOR) 10 MG tablet Take 10 mg by mouth daily.  . Cholecalciferol (VITAMIN D3) 5000 UNITS CAPS Take 1 capsule by mouth daily.  . Coenzyme Q10 (COQ10) 100 MG CAPS Take 1 capsule by mouth 2 (two) times daily.    . cyclobenzaprine (FLEXERIL) 5 MG tablet Take 1 tablet (5 mg total) by mouth 3 (three) times daily as needed for muscle spasms.  . fluticasone (FLONASE) 50 MCG/ACT nasal spray USE 2 SPRAYS IN EACH NOSTRIL ONCE DAILY.  . meloxicam (MOBIC) 15 MG tablet TAKE (1) TABLET DAILY AS DIRECTED.  Marland Kitchen metoprolol tartrate (LOPRESSOR) 25 MG tablet Take 1 tab twice a day  . Misc Natural Products (OSTEO BI-FLEX ADV JOINT SHIELD PO) Take 1 capsule by mouth 2 (two) times daily.   . Multiple Vitamin (MULTI VITAMIN MENS PO) Take 1 tablet by mouth 2 (two) times daily.  . Omega-3 Fatty Acids (FISH OIL) 1000 MG CAPS Take 2 capsules by mouth daily. 1000 mg?   . omeprazole (PRILOSEC) 20 MG capsule Take 20 mg by mouth daily.  . sildenafil (VIAGRA) 100 MG tablet Take 0.5-1 tablets (50-100 mg total) by mouth daily as needed for  erectile dysfunction.    Review of Systems  Constitutional: Negative.   HENT: Negative.   Eyes: Negative.   Respiratory: Negative.   Cardiovascular: Negative.   Gastrointestinal: Negative.   Endocrine: Negative.   Genitourinary: Negative.        Pelvic pressure and pain  Blood visible by patient    Musculoskeletal: Negative.   Skin: Negative.   Allergic/Immunologic: Negative.   Neurological: Negative.   Hematological: Negative.   Psychiatric/Behavioral: Negative.        Objective:   Physical Exam  Constitutional: He is oriented to person, place, and time. He appears well-developed and well-nourished.  HENT:  Head: Normocephalic.  Eyes: Conjunctivae and EOM are normal. Pupils are equal, round, and reactive to light. No scleral icterus.  Neck: Normal range of motion.  Abdominal: Soft. Bowel sounds are normal. He exhibits no distension and no mass. There is tenderness (minimal tenderness right side of abdomen). There is no rebound and no guarding.  Genitourinary: Rectum normal and penis normal.  The prostate was minimally enlarged and minimally congested and there was no significant tenderness with the exam there were no rectal masses and the external genitalia were normal.  Musculoskeletal: Normal range of motion.  Neurological: He is alert and oriented to person, place, and time.  Skin: Skin is dry. No rash noted.  Psychiatric: He has a normal mood and affect. His behavior is normal. Judgment and thought content normal.  Nursing note and vitals reviewed.  BP 152/74 mmHg  Pulse 57  Temp(Src) 97.9 F (36.6 C) (Oral)  Ht 6\' 1"  (1.854 m)  Wt 211 lb (95.709 kg)  BMI 27.84 kg/m2  Results for orders placed or performed in visit on 03/24/14  POCT urinalysis dipstick  Result Value Ref Range   Color, UA straw    Clarity, UA cloudy    Glucose, UA neg    Bilirubin, UA neg    Ketones, UA neg    Spec Grav, UA 1.025    Blood, UA trace    pH, UA 5.0    Protein, UA neg     Urobilinogen, UA negative    Nitrite, UA neg    Leukocytes, UA Negative   POCT UA - Microscopic Only  Result Value Ref Range   WBC, Ur, HPF, POC neg    RBC, urine, microscopic 1-5    Bacteria, U Microscopic many    Mucus, UA neg    Epithelial cells, urine per micros occ    Crystals, Ur, HPF, POC neg    Casts, Ur, LPF, POC neg    Yeast, UA neg    The patient was made aware of this result before he left the office.      Assessment & Plan:  1. Dysuria -Drink plenty of fluids - POCT urinalysis dipstick - POCT UA - Microscopic Only - Urine culture  2. Hematuria -We will arrange a CT scan of the abdomen and pelvis with a stone protocol - POCT urinalysis dipstick; Future - POCT UA - Microscopic Only; Future - Urine culture; Future  3. Hematospermia -Continued observation  Patient Instructions  Continue to drink plenty of fluids Continue to be observant for any more blood in the urine or semen We will arrange for you to have a stone protocol CT scan of your abdomen and pelvis We need to check another urine either Monday or Tuesday If blood is continue to be observed in the urine microscopic or by observation your most likely need an appointment with the urologist for further evaluation He will have access to any x-rays that we do   Arrie Senate MD

## 2014-03-24 NOTE — Telephone Encounter (Signed)
Appointment given for today with Dr. Laurance Flatten at 12:00.

## 2014-03-24 NOTE — Telephone Encounter (Signed)
Pt aware of CT scan  Stone protocol

## 2014-03-24 NOTE — Patient Instructions (Signed)
Continue to drink plenty of fluids Continue to be observant for any more blood in the urine or semen We will arrange for you to have a stone protocol CT scan of your abdomen and pelvis We need to check another urine either Monday or Tuesday If blood is continue to be observed in the urine microscopic or by observation your most likely need an appointment with the urologist for further evaluation He will have access to any x-rays that we do

## 2014-03-24 NOTE — Addendum Note (Signed)
Addended by: Zannie Cove on: 03/24/2014 02:22 PM   Modules accepted: Orders

## 2014-03-25 LAB — URINE CULTURE: Organism ID, Bacteria: NO GROWTH

## 2014-03-28 ENCOUNTER — Telehealth: Payer: Self-pay | Admitting: Family Medicine

## 2014-03-28 ENCOUNTER — Other Ambulatory Visit (INDEPENDENT_AMBULATORY_CARE_PROVIDER_SITE_OTHER): Payer: Medicare Other

## 2014-03-28 DIAGNOSIS — R319 Hematuria, unspecified: Secondary | ICD-10-CM

## 2014-03-28 LAB — POCT URINALYSIS DIPSTICK
Bilirubin, UA: NEGATIVE
Glucose, UA: NEGATIVE
KETONES UA: NEGATIVE
Leukocytes, UA: NEGATIVE
Nitrite, UA: NEGATIVE
PH UA: 6
Protein, UA: NEGATIVE
RBC UA: NEGATIVE
SPEC GRAV UA: 1.01
Urobilinogen, UA: NEGATIVE

## 2014-03-28 LAB — POCT UA - MICROSCOPIC ONLY
Casts, Ur, LPF, POC: NEGATIVE
Crystals, Ur, HPF, POC: NEGATIVE
Epithelial cells, urine per micros: NEGATIVE
Mucus, UA: NEGATIVE
RBC, urine, microscopic: NEGATIVE
Yeast, UA: NEGATIVE

## 2014-03-28 NOTE — Telephone Encounter (Signed)
Detailed message left of results

## 2014-03-28 NOTE — Progress Notes (Signed)
Lab only 

## 2014-03-28 NOTE — Telephone Encounter (Signed)
-----   Message from Chipper Herb, MD sent at 03/27/2014 10:39 AM EST ----- There was no growth on the urine culture. No antibiotic treatment is necessary. Repeat urine as directed

## 2014-03-29 ENCOUNTER — Other Ambulatory Visit: Payer: Self-pay | Admitting: Cardiology

## 2014-03-29 LAB — URINE CULTURE: Organism ID, Bacteria: NO GROWTH

## 2014-03-30 NOTE — Addendum Note (Signed)
Addended by: Pollyann Kennedy F on: 03/30/2014 09:17 AM   Modules accepted: Orders

## 2014-04-12 ENCOUNTER — Ambulatory Visit (INDEPENDENT_AMBULATORY_CARE_PROVIDER_SITE_OTHER): Payer: Medicare Other | Admitting: Family Medicine

## 2014-04-12 VITALS — BP 155/81 | HR 79 | Temp 99.3°F | Ht 73.0 in | Wt 216.4 lb

## 2014-04-12 DIAGNOSIS — R319 Hematuria, unspecified: Secondary | ICD-10-CM | POA: Diagnosis not present

## 2014-04-12 DIAGNOSIS — R05 Cough: Secondary | ICD-10-CM | POA: Diagnosis not present

## 2014-04-12 DIAGNOSIS — R739 Hyperglycemia, unspecified: Secondary | ICD-10-CM | POA: Diagnosis not present

## 2014-04-12 DIAGNOSIS — J206 Acute bronchitis due to rhinovirus: Secondary | ICD-10-CM | POA: Diagnosis not present

## 2014-04-12 DIAGNOSIS — R350 Frequency of micturition: Secondary | ICD-10-CM | POA: Diagnosis not present

## 2014-04-12 DIAGNOSIS — R81 Glycosuria: Secondary | ICD-10-CM | POA: Diagnosis not present

## 2014-04-12 LAB — POCT URINALYSIS DIPSTICK
Bilirubin, UA: NEGATIVE
Blood, UA: NEGATIVE
Ketones, UA: NEGATIVE
Leukocytes, UA: NEGATIVE
Nitrite, UA: NEGATIVE
Protein, UA: NEGATIVE
Spec Grav, UA: 1.015
Urobilinogen, UA: NEGATIVE
pH, UA: 5

## 2014-04-12 LAB — POCT INFLUENZA A/B
Influenza A, POC: NEGATIVE
Influenza B, POC: NEGATIVE

## 2014-04-12 LAB — POCT UA - MICROSCOPIC ONLY
Casts, Ur, LPF, POC: NEGATIVE
Crystals, Ur, HPF, POC: NEGATIVE
Epithelial cells, urine per micros: NEGATIVE
Yeast, UA: NEGATIVE

## 2014-04-12 LAB — POCT GLYCOSYLATED HEMOGLOBIN (HGB A1C): Hemoglobin A1C: 5.2

## 2014-04-12 LAB — GLUCOSE, POCT (MANUAL RESULT ENTRY): POC Glucose: 179 mg/dl — AB (ref 70–99)

## 2014-04-12 MED ORDER — BENZONATATE 100 MG PO CAPS: 100.0000 mg | ORAL_CAPSULE | Freq: Three times a day (TID) | ORAL | Status: DC | PRN

## 2014-04-12 MED ORDER — AZITHROMYCIN 250 MG PO TABS: ORAL_TABLET | ORAL | Status: DC

## 2014-04-12 NOTE — Progress Notes (Signed)
Subjective:    Patient ID: [name] [name], male    DOB: [birth year], 74 y.o.   MRN: [id number]  HPI Patient presents with c/o cough and uri sx's which were worse at night.  He developed sore throat a week ago and now it is in his chest.  He has been taking otc cough and cold medicine and last night he had urinary frequency.  He has been seeing Dr. Laurance Flatten for Hematuria.    Review of Systems  Constitutional: Negative for fever.  HENT: Negative for ear pain.   Eyes: Negative for discharge.  Respiratory: Negative for cough.   Cardiovascular: Negative for chest pain.  Gastrointestinal: Negative for abdominal distention.  Endocrine: Negative for polyuria.  Genitourinary: Negative for difficulty urinating.  Musculoskeletal: Negative for gait problem and neck pain.  Skin: Negative for color change and rash.  Neurological: Negative for speech difficulty and headaches.  Psychiatric/Behavioral: Negative for agitation.       Objective:    BP 155/81 mmHg  Pulse 79  Temp(Src) 99.3 F (37.4 C) (Oral)  Ht 6\' 1"  (1.854 m)  Wt 216 lb 6.4 oz (98.158 kg)  BMI 28.56 kg/m2 Physical Exam  Constitutional: He is oriented to person, place, and time. He appears well-developed and well-nourished.  HENT:  Head: Normocephalic and atraumatic.  Mouth/Throat: Oropharynx is clear and moist.  Eyes: Pupils are equal, round, and reactive to light.  Neck: Normal range of motion. Neck supple.  Cardiovascular: Normal rate and regular rhythm.   No murmur heard. Pulmonary/Chest: Effort normal and breath sounds normal.  Abdominal: Soft. Bowel sounds are normal. There is no tenderness.  Neurological: He is alert and oriented to person, place, and time.  Skin: Skin is warm and dry.  Psychiatric: He has a normal mood and affect.    Results for orders placed or performed in visit on 04/12/14  POCT UA - Microscopic Only  Result Value Ref Range   WBC, Ur, HPF, POC 3-5    RBC, urine, microscopic 1-3    Bacteria, U Microscopic occ    Mucus, UA occ    Epithelial cells, urine per micros neg    Crystals, Ur, HPF, POC neg    Casts, Ur, LPF, POC neg    Yeast, UA neg   POCT urinalysis dipstick  Result Value Ref Range   Color, UA gold    Clarity, UA clear    Glucose, UA 3+    Bilirubin, UA neg    Ketones, UA neg    Spec Grav, UA 1.015    Blood, UA neg    pH, UA 5.0    Protein, UA neg    Urobilinogen, UA negative    Nitrite, UA neg    Leukocytes, UA Negative   POCT glucose (manual entry)  Result Value Ref Range   POC Glucose 179 (A) 70 - 99 mg/dl  POCT glycosylated hemoglobin (Hb A1C)  Result Value Ref Range   Hemoglobin A1C 5.2%   POCT Influenza A/B  Result Value Ref Range   Influenza A, POC Negative    Influenza B, POC Negative         Assessment & Plan:     ICD-9-CM ICD-10-CM   1. Urinary frequency 788.41 R35.0 POCT UA - Microscopic Only     POCT urinalysis dipstick     POCT glucose (manual entry)  2. Acute bronchitis due to Rhinovirus 466.0 J20.6 benzonatate (TESSALON PERLES) 100 MG capsule   079.3  azithromycin (ZITHROMAX) 250 MG tablet  3. Hematuria 599.70 R31.9 Ambulatory referral to Urology  4. Glucosuria 791.5 R81 POCT glucose (manual entry)  5. Hyperglycemia 790.29 R73.9 POCT glycosylated hemoglobin (Hb A1C)     POCT Influenza A/B     No Follow-up on file.  Lysbeth Penner FNP

## 2014-04-15 ENCOUNTER — Ambulatory Visit (INDEPENDENT_AMBULATORY_CARE_PROVIDER_SITE_OTHER): Payer: Medicare Other | Admitting: Family Medicine

## 2014-04-15 ENCOUNTER — Encounter: Payer: Self-pay | Admitting: Family Medicine

## 2014-04-15 VITALS — BP 158/87 | HR 81 | Temp 98.3°F | Ht 73.0 in | Wt 211.0 lb

## 2014-04-15 DIAGNOSIS — J069 Acute upper respiratory infection, unspecified: Secondary | ICD-10-CM

## 2014-04-15 MED ORDER — METHYLPREDNISOLONE ACETATE 80 MG/ML IJ SUSP
80.0000 mg | Freq: Once | INTRAMUSCULAR | Status: AC
  Administered 2014-04-15: 80 mg via INTRAMUSCULAR

## 2014-04-15 MED ORDER — AMOXICILLIN 875 MG PO TABS: 875.0000 mg | ORAL_TABLET | Freq: Two times a day (BID) | ORAL | Status: DC

## 2014-04-15 NOTE — Progress Notes (Signed)
Subjective:    Patient ID: [name] [name], male    DOB: 1940-07-31, 74 y.o.   MRN: [id number]  HPI Patient is here for follow up.  He was recently seen for URI and cough and he states he is not better.   Review of Systems    No chest pain, SOB, HA, dizziness, vision change, N/V, diarrhea, constipation, dysuria, urinary urgency or frequency, myalgias, arthralgias or rash.  Objective:   Physical Exam Vital signs noted  Well developed well nourished male.  HEENT - Head atraumatic Normocephalic                Eyes - PERRLA, Conjuctiva - clear Sclera- Clear EOMI                Ears - EAC's Wnl TM's Wnl Gross Hearing WNL                Nose - Nares patent                 Throat - oropharanx wnl Respiratory - Lungs CTA bilateral Cardiac - RRR S1 and S2 without murmur GI - Abdomen soft Nontender and bowel sounds active x 4 Extremities - No edema. Neuro - Grossly intact.       Assessment & Plan:                                                                                                                                                                        URI (upper respiratory infection) - Plan: amoxicillin (AMOXIL) 875 MG tablet, methylPREDNISolone acetate (DEPO-MEDROL) injection 80 mg                                                                              Push po fluids, rest, tylenol and motrin otc prn as directed for fever, arthralgias, and myalgias.  Follow up prn if sx's continue or persist.  Lysbeth Penner FNP

## 2014-05-09 ENCOUNTER — Ambulatory Visit (HOSPITAL_BASED_OUTPATIENT_CLINIC_OR_DEPARTMENT_OTHER): Payer: Medicare Other | Attending: Internal Medicine | Admitting: Radiology

## 2014-05-09 VITALS — Ht 73.0 in | Wt 208.0 lb

## 2014-05-09 DIAGNOSIS — R0683 Snoring: Secondary | ICD-10-CM

## 2014-05-09 DIAGNOSIS — N281 Cyst of kidney, acquired: Secondary | ICD-10-CM | POA: Diagnosis not present

## 2014-05-09 DIAGNOSIS — G4733 Obstructive sleep apnea (adult) (pediatric): Secondary | ICD-10-CM | POA: Diagnosis not present

## 2014-05-09 DIAGNOSIS — R109 Unspecified abdominal pain: Secondary | ICD-10-CM | POA: Diagnosis not present

## 2014-05-09 DIAGNOSIS — R312 Other microscopic hematuria: Secondary | ICD-10-CM | POA: Diagnosis not present

## 2014-05-09 DIAGNOSIS — Z87442 Personal history of urinary calculi: Secondary | ICD-10-CM | POA: Diagnosis not present

## 2014-05-09 DIAGNOSIS — R319 Hematuria, unspecified: Secondary | ICD-10-CM | POA: Diagnosis not present

## 2014-05-12 ENCOUNTER — Other Ambulatory Visit (INDEPENDENT_AMBULATORY_CARE_PROVIDER_SITE_OTHER): Payer: Medicare Other

## 2014-05-12 DIAGNOSIS — R748 Abnormal levels of other serum enzymes: Secondary | ICD-10-CM | POA: Diagnosis not present

## 2014-05-12 DIAGNOSIS — R946 Abnormal results of thyroid function studies: Secondary | ICD-10-CM | POA: Diagnosis not present

## 2014-05-12 DIAGNOSIS — R319 Hematuria, unspecified: Secondary | ICD-10-CM

## 2014-05-12 DIAGNOSIS — R7989 Other specified abnormal findings of blood chemistry: Secondary | ICD-10-CM

## 2014-05-12 DIAGNOSIS — E785 Hyperlipidemia, unspecified: Secondary | ICD-10-CM | POA: Diagnosis not present

## 2014-05-12 LAB — POCT URINALYSIS DIPSTICK
Bilirubin, UA: NEGATIVE
Blood, UA: NEGATIVE
Glucose, UA: NEGATIVE
Ketones, UA: NEGATIVE
Leukocytes, UA: NEGATIVE
NITRITE UA: NEGATIVE
PH UA: 6
Protein, UA: NEGATIVE
SPEC GRAV UA: 1.01
Urobilinogen, UA: NEGATIVE

## 2014-05-12 LAB — POCT UA - MICROSCOPIC ONLY
Bacteria, U Microscopic: NEGATIVE
Casts, Ur, LPF, POC: NEGATIVE
Crystals, Ur, HPF, POC: NEGATIVE
MUCUS UA: NEGATIVE
RBC, urine, microscopic: NEGATIVE
WBC, Ur, HPF, POC: NEGATIVE
Yeast, UA: NEGATIVE

## 2014-05-13 LAB — HEPATIC FUNCTION PANEL
ALT: 18 IU/L (ref 0–44)
AST: 22 IU/L (ref 0–40)
Albumin: 4.3 g/dL (ref 3.5–4.8)
Alkaline Phosphatase: 62 IU/L (ref 39–117)
Bilirubin Total: 0.5 mg/dL (ref 0.0–1.2)
Bilirubin, Direct: 0.16 mg/dL (ref 0.00–0.40)
Total Protein: 6.4 g/dL (ref 6.0–8.5)

## 2014-05-13 LAB — LIPID PANEL
CHOLESTEROL TOTAL: 148 mg/dL (ref 100–199)
Chol/HDL Ratio: 2.8 ratio units (ref 0.0–5.0)
HDL: 53 mg/dL (ref >39)
LDL Calculated: 81 mg/dL (ref 0–99)
Triglycerides: 70 mg/dL (ref 0–149)
VLDL Cholesterol Cal: 14 mg/dL (ref 5–40)

## 2014-05-13 LAB — THYROID PANEL WITH TSH
Free Thyroxine Index: 2.7 (ref 1.2–4.9)
T3 Uptake Ratio: 37 % (ref 24–39)
T4 TOTAL: 7.2 ug/dL (ref 4.5–12.0)
TSH: 3.46 u[IU]/mL (ref 0.450–4.500)

## 2014-05-13 LAB — CK: Total CK: 212 U/L — ABNORMAL HIGH (ref 24–204)

## 2014-05-14 ENCOUNTER — Encounter: Payer: Self-pay | Admitting: Family Medicine

## 2014-05-14 DIAGNOSIS — G4733 Obstructive sleep apnea (adult) (pediatric): Secondary | ICD-10-CM | POA: Diagnosis not present

## 2014-05-14 DIAGNOSIS — R0683 Snoring: Secondary | ICD-10-CM | POA: Diagnosis not present

## 2014-05-14 NOTE — Sleep Study (Signed)
    NAME: [name] [name] DATE OF BIRTH:  1940/12/01 MEDICAL RECORD NUMBER [id number]  LOCATION: [facility]  PHYSICIAN: Aris Even D  DATE OF STUDY: 05/09/2014  SLEEP STUDY TYPE: Out of Center Sleep Test                REFERRING PHYSICIAN: Deneise Lever, MD  INDICATION FOR STUDY: Hypersomnia with sleep apnea  EPWORTH SLEEPINESS SCORE:   9/24 HEIGHT: 6\' 1"  (185.4 cm)  WEIGHT: 208 lb (94.348 kg)    Body mass index is 27.45 kg/(m^2).  NECK SIZE:   17.5 in.  MEDICATIONS: Charted for review  IMPRESSION:  Moderate obstructive sleep apnea/hypopnea syndrome, AHI 24.9 per hour. 130 total events scored including 36 central apneas, 17 obstructive apneas, 5 mixed apneas, 72 hypopneas. Sleep position not indicated. Snoring with oxygen desaturation to a nadir of 85% and mean saturation 93% on room air mean heart rate 52.6 bpm    RECOMMENDATION:  For scores in this range, initial therapy is usually CPAP unless individual circumstances suggest a different approach. This patient can be scheduled for a dedicated CPAP titration study if appropriate. There will likely be residual central apneas, not affected by CPAP, of less clinical concern.   Deneise Lever Diplomate, American Board of Sleep Medicine  ELECTRONICALLY SIGNED ON:  05/14/2014, 9:26 AM [place] PH: [phone]   FX: [phone] 0202 [place]

## 2014-05-17 ENCOUNTER — Ambulatory Visit (INDEPENDENT_AMBULATORY_CARE_PROVIDER_SITE_OTHER): Payer: Medicare Other | Admitting: Family Medicine

## 2014-05-17 ENCOUNTER — Encounter: Payer: Self-pay | Admitting: Family Medicine

## 2014-05-17 VITALS — BP 137/76 | HR 55 | Temp 97.5°F | Ht 73.0 in | Wt 213.0 lb

## 2014-05-17 DIAGNOSIS — D696 Thrombocytopenia, unspecified: Secondary | ICD-10-CM | POA: Diagnosis not present

## 2014-05-17 DIAGNOSIS — Z79899 Other long term (current) drug therapy: Secondary | ICD-10-CM | POA: Diagnosis not present

## 2014-05-17 DIAGNOSIS — E785 Hyperlipidemia, unspecified: Secondary | ICD-10-CM | POA: Diagnosis not present

## 2014-05-17 DIAGNOSIS — N400 Enlarged prostate without lower urinary tract symptoms: Secondary | ICD-10-CM | POA: Diagnosis not present

## 2014-05-17 DIAGNOSIS — J302 Other seasonal allergic rhinitis: Secondary | ICD-10-CM

## 2014-05-17 DIAGNOSIS — E559 Vitamin D deficiency, unspecified: Secondary | ICD-10-CM | POA: Diagnosis not present

## 2014-05-17 DIAGNOSIS — H538 Other visual disturbances: Secondary | ICD-10-CM | POA: Diagnosis not present

## 2014-05-17 DIAGNOSIS — L570 Actinic keratosis: Secondary | ICD-10-CM | POA: Diagnosis not present

## 2014-05-17 DIAGNOSIS — Z8052 Family history of malignant neoplasm of bladder: Secondary | ICD-10-CM | POA: Diagnosis not present

## 2014-05-17 NOTE — Patient Instructions (Addendum)
Medicare Annual Wellness Visit  [facility] and the medical providers at [facility] strive to bring you the best medical care.  In doing so we not only want to address your current medical conditions and concerns but also to detect new conditions early and prevent illness, disease and health-related problems.    Medicare offers a yearly Wellness Visit which allows our clinical staff to assess your need for preventative services including immunizations, lifestyle education, counseling to decrease risk of preventable diseases and screening for fall risk and other medical concerns.    This visit is provided free of charge (no copay) for all Medicare recipients. The clinical pharmacists at [facility] have begun to conduct these Wellness Visits which will also include a thorough review of all your medications.    As you primary medical provider recommend that you make an appointment for your Annual Wellness Visit if you have not done so already this year.  You may set up this appointment before you leave today or you may call back ([phone]) and schedule an appointment.  Please make sure when you call that you mention that you are scheduling your Annual Wellness Visit with the clinical pharmacist so that the appointment may be made for the proper length of time.     Continue current medications. Continue good therapeutic lifestyle changes which include good diet and exercise. Fall precautions discussed with patient. If an FOBT was given today- please return it to our front desk. If you are over 22 years old - you may need Prevnar 66 or the adult Pneumonia vaccine.  Flu Shots are still available at our office. If you still haven't had one please call to set up a nurse visit to get one.   After your visit with [place] today you will receive a survey in the mail or online from [employer] regarding your care with [place]. Please take a moment to  fill this out. Your feedback is very important to [place] as you can help [place] better understand your patient needs as well as improve your experience and satisfaction. WE CARE ABOUT YOU!!!   Please discontinue the Lipitor because of that rise in the CK Return to clinic in about 4 weeks for an advanced lipid panel and one week later make arrangements to see the clinical pharmacist to be considered for a PCS K-9 inhibitors Continue to watch diet as closely as possible Follow-up with the urologist as planned in September Get some artificial tears and use these in her eyes for the next couple weeks Start the Flonase 1-2 sprays each nostril daily  Please call Dr. Maren Reamer office and Dr. Ralene Muskrat office to get more detail reports of the information you need regarding the sleep study and the CT scan with contrast If the problem with the right eye continues with blurred vision after using artificial tears give [place] a call back in a couple weeks and let us know this and we will make arrangements for you to see the ophthalmologist again.

## 2014-05-17 NOTE — Progress Notes (Signed)
Subjective:    Patient ID: [name] [name], male    DOB: Jul 21, 1940, 74 y.o.   MRN: [id number]  HPI Pt here for follow up and management of chronic medical problems which includes hyperlipidemia. He is taking medications regularly.        Patient Active Problem List   Diagnosis Date Noted  . Obstructive sleep apnea 03/17/2014  . Erectile dysfunction due to arterial insufficiency 01/12/2014  . Thrombocytopenia 12/30/2013  . B12 deficiency 10/01/2012  . Allergic rhinitis 10/01/2012  . BPH (benign prostatic hyperplasia) 10/01/2012  . PVC's (premature ventricular contractions) 09/01/2009  . COLONIC POLYPS 04/14/2009  . Hyperlipidemia 04/14/2009  . Essential hypertension 04/14/2009  . Coronary atherosclerosis 04/14/2009  . HIATAL HERNIA 04/14/2009  . HERNIATED DISC 04/14/2009   Outpatient Encounter Prescriptions as of 05/17/2014  Medication Sig  . aspirin 81 MG tablet Take 1 tablet twice a day  . atorvastatin (LIPITOR) 10 MG tablet Take 10 mg by mouth daily.  . Cholecalciferol (VITAMIN D3) 5000 UNITS CAPS Take 1 capsule by mouth daily.  . Coenzyme Q10 (COQ10) 100 MG CAPS Take 1 capsule by mouth 2 (two) times daily.    . cyclobenzaprine (FLEXERIL) 5 MG tablet Take 1 tablet (5 mg total) by mouth 3 (three) times daily as needed for muscle spasms.  . fluticasone (FLONASE) 50 MCG/ACT nasal spray USE 2 SPRAYS IN EACH NOSTRIL ONCE DAILY.  . meloxicam (MOBIC) 15 MG tablet TAKE (1) TABLET DAILY AS DIRECTED.  Marland Kitchen metoprolol tartrate (LOPRESSOR) 25 MG tablet TAKE (1) TABLET TWICE A DAY.  Marland Kitchen Misc Natural Products (OSTEO BI-FLEX ADV JOINT SHIELD PO) Take 1 capsule by mouth 2 (two) times daily.   . Multiple Vitamin (MULTI VITAMIN MENS PO) Take 1 tablet by mouth 2 (two) times daily.  . Omega-3 Fatty Acids (FISH OIL) 1000 MG CAPS Take 2 capsules by mouth daily. 1000 mg?   . omeprazole (PRILOSEC) 20 MG capsule Take 20 mg by mouth daily.  . sildenafil (VIAGRA) 100 MG tablet Take 0.5-1 tablets  (50-100 mg total) by mouth daily as needed for erectile dysfunction.  . [DISCONTINUED] amoxicillin (AMOXIL) 875 MG tablet Take 1 tablet (875 mg total) by mouth 2 (two) times daily.  . [DISCONTINUED] azithromycin (ZITHROMAX) 250 MG tablet Take 2 po first day and then one po qd x 4 days  . [DISCONTINUED] benzonatate (TESSALON PERLES) 100 MG capsule Take 1 capsule (100 mg total) by mouth 3 (three) times daily as needed.    Review of Systems  Constitutional: Negative.   HENT: Negative.   Eyes: Visual disturbance: right eye.  Respiratory: Negative.   Cardiovascular: Negative.   Gastrointestinal: Negative.   Endocrine: Negative.   Genitourinary: Negative.   Musculoskeletal: Negative.   Skin: Negative.   Allergic/Immunologic: Negative.   Neurological: Negative.   Hematological: Negative.   Psychiatric/Behavioral: Negative.        Objective:   Physical Exam  Constitutional: He is oriented to person, place, and time. He appears well-developed and well-nourished. No distress.  HENT:  Head: Normocephalic and atraumatic.  Right Ear: External ear normal.  Left Ear: External ear normal.  Mouth/Throat: Oropharynx is clear and moist. No oropharyngeal exudate.  Nasal congestion  Eyes: Conjunctivae and EOM are normal. Pupils are equal, round, and reactive to light. Right eye exhibits no discharge. Left eye exhibits no discharge. No scleral icterus.  The right eye left eye both appeared normal without conjunctival redness. There is a lot of redundant upper eyelid skin in both eyes.  Neck: Normal range of motion. Neck supple. No thyromegaly present.  No bruits or anterior cervical adenopathy  Cardiovascular: Normal rate, regular rhythm, normal heart sounds and intact distal pulses.  Exam reveals no gallop and no friction rub.   No murmur heard. At 60/m  Pulmonary/Chest: Effort normal and breath sounds normal. No respiratory distress. He has no wheezes. He has no rales. He exhibits no tenderness.    Axillary is negative for nodes  Abdominal: Soft. Bowel sounds are normal. He exhibits no mass. There is no tenderness. There is no rebound and no guarding.  Inguinal region is negative for nodes  Musculoskeletal: Normal range of motion. He exhibits no edema or tenderness.  Lymphadenopathy:    He has no cervical adenopathy.  Neurological: He is alert and oriented to person, place, and time. He has normal reflexes. No cranial nerve deficit.  Skin: Skin is warm and dry. No rash noted. No erythema. No pallor.  The Patient was complaining of a couple of actinic keratoses one on his left arm and one on his right flight and cryotherapy was performed on both of these lesions without problems  Psychiatric: He has a normal mood and affect. His behavior is normal. Judgment and thought content normal.  Nursing note and vitals reviewed.   BP 137/76 mmHg  Pulse 55  Temp(Src) 97.5 F (36.4 C) (Oral)  Ht 6\' 1"  (1.854 m)  Wt 213 lb (96.616 kg)  BMI 28.11 kg/m2       Assessment & Plan:  1. BPH (benign prostatic hyperplasia) -Continue follow-up with Dr. Jeffie Pollock  2. High risk medication use - NMR, lipoprofile; Future  3. Vitamin D deficiency -Continue current treatment pending future lab results  4. Hyperlipemia -Patient will hold the Lipitor because of a rising CK. It was discussed with him today about the new PCS K-9 inhibitors and he is willing to meet with our clinical pharmacist and discuss initiating one of these new meds for his cholesterol as he is intolerant to all the current ones that are available with a rising CKs and increased muscle discomfort - NMR, lipoprofile; Future  5. Thrombocytopenia -We will continue to monitor this and the patient's had no bleeding issues  6. Family history of bladder cancer -Follow up with Dr. Jeffie Pollock as planned  7. Other seasonal allergic rhinitis -Use artificial tears and Flonase over-the-counter  8. Blurred vision, right eye -If problems with  vision are not better in 2 weeks please call us and we will arrange for you to see the ophthalmologist  9. Actinic keratoses -Cryotherapy of 2 lesions today without problem  No orders of the defined types were placed in this encounter.   Patient Instructions                       Medicare Annual Wellness Visit  [facility] and the medical providers at [facility] strive to bring you the best medical care.  In doing so we not only want to address your current medical conditions and concerns but also to detect new conditions early and prevent illness, disease and health-related problems.    Medicare offers a yearly Wellness Visit which allows our clinical staff to assess your need for preventative services including immunizations, lifestyle education, counseling to decrease risk of preventable diseases and screening for fall risk and other medical concerns.    This visit is provided free of charge (no copay) for all Medicare recipients. The clinical pharmacists at [facility] have begun to conduct these Wellness Visits which will also include a thorough review of all your medications.    As you primary medical provider recommend that you make an appointment for your Annual Wellness Visit if you have not done so already this year.  You may set up this appointment before you leave today or you may call back ([phone]) and schedule an appointment.  Please make sure when you call that you mention that you are scheduling your Annual Wellness Visit with the clinical pharmacist so that the appointment may be made for the proper length of time.     Continue current medications. Continue good therapeutic lifestyle changes which include good diet and exercise. Fall precautions discussed with patient. If an FOBT was given today- please return it to our front desk. If you are over 91 years old - you may need Prevnar 46 or the adult Pneumonia vaccine.  Flu  Shots are still available at our office. If you still haven't had one please call to set up a nurse visit to get one.   After your visit with [place] today you will receive a survey in the mail or online from [employer] regarding your care with [place]. Please take a moment to fill this out. Your feedback is very important to [place] as you can help [place] better understand your patient needs as well as improve your experience and satisfaction. WE CARE ABOUT YOU!!!   Please discontinue the Lipitor because of that rise in the CK Return to clinic in about 4 weeks for an advanced lipid panel and one week later make arrangements to see the clinical pharmacist to be considered for a PCS K-9 inhibitors Continue to watch diet as closely as possible Follow-up with the urologist as planned in September Get some artificial tears and use these in her eyes for the next couple weeks Start the Flonase 1-2 sprays each nostril daily  Please call Dr. Maren Reamer office and Dr. Ralene Muskrat office to get more detail reports of the information you need regarding the sleep study and the CT scan with contrast If the problem with the right eye continues with blurred vision after using artificial tears give [place] a call back in a couple weeks and let us know this and we will make arrangements for you to see the ophthalmologist again.   Arrie Senate MD

## 2014-06-16 ENCOUNTER — Encounter: Payer: Self-pay | Admitting: Nurse Practitioner

## 2014-06-16 ENCOUNTER — Ambulatory Visit (INDEPENDENT_AMBULATORY_CARE_PROVIDER_SITE_OTHER): Payer: Medicare Other | Admitting: Nurse Practitioner

## 2014-06-16 VITALS — BP 141/83 | HR 59 | Temp 96.9°F | Ht 73.0 in | Wt 216.0 lb

## 2014-06-16 DIAGNOSIS — J209 Acute bronchitis, unspecified: Secondary | ICD-10-CM | POA: Diagnosis not present

## 2014-06-16 MED ORDER — AZITHROMYCIN 250 MG PO TABS: ORAL_TABLET | ORAL | Status: DC

## 2014-06-16 MED ORDER — BENZONATATE 100 MG PO CAPS: 100.0000 mg | ORAL_CAPSULE | Freq: Three times a day (TID) | ORAL | Status: DC | PRN

## 2014-06-16 MED ORDER — METHYLPREDNISOLONE ACETATE 80 MG/ML IJ SUSP
80.0000 mg | Freq: Once | INTRAMUSCULAR | Status: AC
  Administered 2014-06-16: 80 mg via INTRAMUSCULAR

## 2014-06-16 NOTE — Addendum Note (Signed)
Addended by: Chevis Pretty on: 06/16/2014 06:48 PM   Modules accepted: Orders

## 2014-06-16 NOTE — Patient Instructions (Signed)

## 2014-06-16 NOTE — Progress Notes (Signed)
  Subjective:     [name] [name] is a 74 y.o. male here for evaluation of a cough. Onset of symptoms was 3 days ago. Symptoms have been gradually worsening since that time. The cough is barky and productive of green sputum and is aggravated by nothing. Associated symptoms include: change in voice. Patient does not have a history of asthma. Patient does not have a history of environmental allergens. Patient has not traveled recently. Patient does not have a history of smoking. Patient has had a previous chest x-ray. Patient has not had a PPD done.  The following portions of the patient's history were reviewed and updated as appropriate: allergies, current medications, past family history, past medical history, past social history, past surgical history and problem list.  Review of Systems Pertinent items are noted in HPI.    Objective:     BP 141/83 mmHg  Pulse 59  Temp(Src) 96.9 F (36.1 C) (Oral)  Ht 6\' 1"  (1.854 m)  Wt 216 lb (97.977 kg)  BMI 28.50 kg/m2 General appearance: alert and cooperative Head: Normocephalic, without obvious abnormality Eyes: conjunctivae/corneas clear. PERRL, EOM's intact. Fundi benign. Ears: normal TM's and external ear canals both ears Throat: lips, mucosa, and tongue normal; teeth and gums normal Lungs: wheezes bibasilar and LLL Heart: regular rate and rhythm, S1, S2 normal, no murmur, click, rub or gallop    Assessment:    Acute Bronchitis    Plan:  1. Take meds as prescribed 2. Use a cool mist humidifier especially during the winter months and when heat has been humid. 3. Use saline nose sprays frequently 4. Saline irrigations of the nose can be very helpful if done frequently.  * 4X daily for 1 week*  * Use of a nettie pot can be helpful with this. Follow directions with this* 5. Drink plenty of fluids 6. Keep thermostat turn down low 7.For any cough or congestion  Use plain Mucinex- regular strength or max strength is fine   * Children-  consult with Pharmacist for dosing 8. For fever or aces or pains- take tylenol or ibuprofen appropriate for age and weight.  * for fevers greater than 101 orally you may alternate ibuprofen and tylenol every  3 hours.   Meds ordered this encounter  Medications  . azithromycin (ZITHROMAX Z-PAK) 250 MG tablet    Sig: As directed    Dispense:  1 each    Refill:  0    Order Specific Question:  Supervising Provider    Answer:  Chipper Herb [[id number]]  . benzonatate (TESSALON PERLES) 100 MG capsule    Sig: Take 1 capsule (100 mg total) by mouth 3 (three) times daily as needed for cough.    Dispense:  20 capsule    Refill:  0    Order Specific Question:  Supervising Provider    Answer:  Chipper Herb [[id number]]  . methylPREDNISolone acetate (DEPO-MEDROL) injection 80 mg    Sig:    Mary-Margaret Hassell Done, FNP

## 2014-06-17 ENCOUNTER — Other Ambulatory Visit (INDEPENDENT_AMBULATORY_CARE_PROVIDER_SITE_OTHER): Payer: Medicare Other

## 2014-06-17 DIAGNOSIS — E785 Hyperlipidemia, unspecified: Secondary | ICD-10-CM | POA: Diagnosis not present

## 2014-06-17 DIAGNOSIS — Z79899 Other long term (current) drug therapy: Secondary | ICD-10-CM

## 2014-06-17 NOTE — Progress Notes (Signed)
Lab only 

## 2014-06-17 NOTE — Addendum Note (Signed)
Addended by: Selmer Dominion on: 06/17/2014 08:34 AM   Modules accepted: Orders

## 2014-06-18 LAB — NMR, LIPOPROFILE
CHOLESTEROL: 223 mg/dL — AB (ref 100–199)
HDL CHOLESTEROL BY NMR: 51 mg/dL (ref >39)
HDL PARTICLE NUMBER: 26.7 umol/L — AB (ref >=30.5)
LDL Particle Number: 1993 nmol/L — ABNORMAL HIGH (ref <1000)
LDL Size: 21.4 nm (ref >20.5)
LDL-C: 156 mg/dL — ABNORMAL HIGH (ref 0–99)
LP-IR Score: <25 (ref <=45)
Small LDL Particle Number: 548 nmol/L — ABNORMAL HIGH (ref <=527)
Triglycerides by NMR: 78 mg/dL (ref 0–149)

## 2014-06-21 ENCOUNTER — Encounter: Payer: Self-pay | Admitting: Pharmacist Clinician (PhC)/ Clinical Pharmacy Specialist

## 2014-06-21 ENCOUNTER — Ambulatory Visit (INDEPENDENT_AMBULATORY_CARE_PROVIDER_SITE_OTHER): Payer: Medicare Other | Admitting: Pharmacist Clinician (PhC)/ Clinical Pharmacy Specialist

## 2014-06-21 DIAGNOSIS — E785 Hyperlipidemia, unspecified: Secondary | ICD-10-CM

## 2014-06-21 MED ORDER — EVOLOCUMAB 140 MG/ML ~~LOC~~ SOSY: 140.0000 mg | PREFILLED_SYRINGE | SUBCUTANEOUS | Status: DC

## 2014-06-21 NOTE — Progress Notes (Signed)
   Subjective:    Patient ID: [name] [name], male    DOB: [birth year], 74 y.o.   MRN: [id number]  HPI:  Long standing hyperlipidemia    Review of Systems     Objective:   Physical Exam        Assessment & Plan:   Lipid Clinic Consultation  Chief Complaint:  No chief complaint on file.    Exam Regularity:  RRR Edema:  neg Respirations:  18   Carotid Bruits:  neg Xanthomas:  neg General Appearance:  alert, oriented, no acute distress Mood/Affect:  normal  HPI:  ASCVD     Component Value Date/Time   CHOL 223* 06/17/2014 0834   CHOL 148 05/12/2014 0829   CHOL 160 05/04/2013 0822   TRIG 78 06/17/2014 0834   TRIG 70 05/12/2014 0829   TRIG 92 05/04/2013 0822   HDL 51 06/17/2014 0834   HDL 53 05/12/2014 0829   HDL 55 05/04/2013 0822   CHOLHDL 2.8 05/12/2014 0829    Assessment: CHD/CHF Risk Equivalents:  ASCUD and CAD [place] Estd 32yrs risk:  N/A NCEP Risk Factors Present:  HTN and family history Primary Problem(s):  LDL or LDL-P elevated  Current NCEP Goals: LDL Goal < 70 HDL Goal >/= 40 Tg Goal < 150 Non-HDL Goal < 100  Secondary cause of hyperlipidemia present:  none Low fat diet followed?  Yes -   Low carb diet followed?  No -   Exercise?  Yes - as tolerated he does have bilateral leg weakness  Recommendations: Changes in lipid medication(s):  Change Lipitor to Repatha 140mg  sub cut q 2 weeks  Patient has had chronic CK elevations to statin therapy Recheck Lipid Panel:  12 weeks Other labs needed:  CK  Time spent counseling patient:  45 minutes  Physician time spent with patient:    Referring Provider:  Laurance Flatten   PharmD:  [facility] Pharmacist

## 2014-07-05 ENCOUNTER — Telehealth: Payer: Self-pay | Admitting: Pharmacist Clinician (PhC)/ Clinical Pharmacy Specialist

## 2014-07-05 NOTE — Telephone Encounter (Signed)
Called patient with update on Repatha.  [place] will be used for filing this prescription.  All the forms have been faxed back to [facility].

## 2014-07-26 ENCOUNTER — Other Ambulatory Visit: Payer: Self-pay | Admitting: Family Medicine

## 2014-07-26 ENCOUNTER — Telehealth: Payer: Self-pay | Admitting: Family Medicine

## 2014-07-27 NOTE — Telephone Encounter (Signed)
[name] is looking into this.  Message has been forwarded to her.  Patient notified.

## 2014-07-29 ENCOUNTER — Encounter: Payer: Self-pay | Admitting: Family Medicine

## 2014-07-29 ENCOUNTER — Ambulatory Visit (INDEPENDENT_AMBULATORY_CARE_PROVIDER_SITE_OTHER): Payer: Medicare Other | Admitting: Family Medicine

## 2014-07-29 VITALS — BP 138/83 | HR 64 | Temp 97.9°F | Ht 73.0 in | Wt 206.0 lb

## 2014-07-29 DIAGNOSIS — I25810 Atherosclerosis of coronary artery bypass graft(s) without angina pectoris: Secondary | ICD-10-CM | POA: Diagnosis not present

## 2014-07-29 DIAGNOSIS — R002 Palpitations: Secondary | ICD-10-CM | POA: Diagnosis not present

## 2014-07-29 DIAGNOSIS — R030 Elevated blood-pressure reading, without diagnosis of hypertension: Secondary | ICD-10-CM | POA: Diagnosis not present

## 2014-07-29 DIAGNOSIS — G4700 Insomnia, unspecified: Secondary | ICD-10-CM | POA: Diagnosis not present

## 2014-07-29 DIAGNOSIS — IMO0001 Reserved for inherently not codable concepts without codable children: Secondary | ICD-10-CM

## 2014-07-29 LAB — POCT CBC
Granulocyte percent: 70.1 %G (ref 37–80)
HEMATOCRIT: 45.3 % (ref 43.5–53.7)
HEMOGLOBIN: 14.3 g/dL (ref 14.1–18.1)
LYMPH, POC: 1.7 (ref 0.6–3.4)
MCH, POC: 29.9 pg (ref 27–31.2)
MCHC: 31.7 g/dL — AB (ref 31.8–35.4)
MCV: 94.4 fL (ref 80–97)
MPV: 8 fL (ref 0–99.8)
PLATELET COUNT, POC: 162 10*3/uL (ref 142–424)
POC GRANULOCYTE: 5.3 (ref 2–6.9)
POC LYMPH PERCENT: 22.7 %L (ref 10–50)
RBC: 4.8 M/uL (ref 4.69–6.13)
RDW, POC: 15.3 %
WBC: 7.5 10*3/uL (ref 4.6–10.2)

## 2014-07-29 MED ORDER — ATORVASTATIN CALCIUM 20 MG PO TABS: 20.0000 mg | ORAL_TABLET | Freq: Every day | ORAL | Status: DC

## 2014-07-29 NOTE — Addendum Note (Signed)
Addended by: Zannie Cove on: 07/29/2014 04:27 PM   Modules accepted: Orders

## 2014-07-29 NOTE — Patient Instructions (Signed)
If the patient continues to have these palpitations and staying awake at night he should have a 24-hour Holter monitor if it's a nightly problem or a Edison Pace of Hearts monitor if it's a periodic problem and not nightly. He will call us in a couple weeks if these palpitations are continuing. He should continue to monitor the blood pressure at home He should watch his sodium intake

## 2014-07-29 NOTE — Progress Notes (Signed)
Subjective:    Patient ID: [name] [name], male    DOB: Feb 06, 1941, 74 y.o.   MRN: [id number]  HPI Patient here today for elevated Bp and feeling of palpitations. The patient does drink 2-3 cups of coffee every morning but does not have any other caffeine during the day. He has not had any chest pain. He feels this mostly at night when he is sleeping. It keeps him awake. The blood pressure at home was elevated early this morning but on repeat it was better. Her blood pressure rechecked here was 124/79 after the first number was obtained which was 138/83. The patient denies any shortness of breath or trouble with his GI tract. The only caffeine that he has with intake is coffee.     Patient Active Problem List   Diagnosis Date Noted  . Obstructive sleep apnea 03/17/2014  . Erectile dysfunction due to arterial insufficiency 01/12/2014  . Thrombocytopenia 12/30/2013  . B12 deficiency 10/01/2012  . Allergic rhinitis 10/01/2012  . BPH (benign prostatic hyperplasia) 10/01/2012  . PVC's (premature ventricular contractions) 09/01/2009  . COLONIC POLYPS 04/14/2009  . Hyperlipidemia 04/14/2009  . Essential hypertension 04/14/2009  . Coronary atherosclerosis 04/14/2009  . HIATAL HERNIA 04/14/2009  . HERNIATED DISC 04/14/2009   Outpatient Encounter Prescriptions as of 07/29/2014  Medication Sig  . aspirin 81 MG tablet Take 1 tablet twice a day  . atorvastatin (LIPITOR) 10 MG tablet Take 10 mg by mouth daily.  . Cholecalciferol (VITAMIN D3) 5000 UNITS CAPS Take 1 capsule by mouth daily.  . Coenzyme Q10 (COQ10) 100 MG CAPS Take 1 capsule by mouth 2 (two) times daily.    . cyclobenzaprine (FLEXERIL) 5 MG tablet Take 1 tablet (5 mg total) by mouth 3 (three) times daily as needed for muscle spasms.  . Evolocumab (REPATHA) 140 MG/ML SOSY Inject 140 mg into the skin as directed.  . fluticasone (FLONASE) 50 MCG/ACT nasal spray USE 2 SPRAYS IN EACH NOSTRIL ONCE DAILY.  . meloxicam (MOBIC) 15 MG  tablet TAKE (1) TABLET DAILY AS DIRECTED.  Marland Kitchen metoprolol tartrate (LOPRESSOR) 25 MG tablet TAKE (1) TABLET TWICE A DAY.  Marland Kitchen Misc Natural Products (OSTEO BI-FLEX ADV JOINT SHIELD PO) Take 1 capsule by mouth 2 (two) times daily.   . Multiple Vitamin (MULTI VITAMIN MENS PO) Take 1 tablet by mouth 2 (two) times daily.  . Omega-3 Fatty Acids (FISH OIL) 1000 MG CAPS Take 2 capsules by mouth daily. 1000 mg?   . omeprazole (PRILOSEC) 20 MG capsule Take 20 mg by mouth daily.  . sildenafil (VIAGRA) 100 MG tablet Take 0.5-1 tablets (50-100 mg total) by mouth daily as needed for erectile dysfunction.  . [DISCONTINUED] azithromycin (ZITHROMAX Z-PAK) 250 MG tablet As directed  . [DISCONTINUED] benzonatate (TESSALON PERLES) 100 MG capsule Take 1 capsule (100 mg total) by mouth 3 (three) times daily as needed for cough.   No facility-administered encounter medications on file as of 07/29/2014.      Review of Systems  Constitutional: Negative.   HENT: Negative.   Eyes: Negative.   Respiratory: Negative.   Cardiovascular: Positive for palpitations.  Gastrointestinal: Negative.   Endocrine: Negative.   Genitourinary: Negative.   Musculoskeletal: Negative.   Skin: Negative.   Allergic/Immunologic: Negative.   Neurological: Negative.   Hematological: Negative.   Psychiatric/Behavioral: Negative.        Objective:   Physical Exam  Constitutional: He is oriented to person, place, and time. He appears well-developed and well-nourished.  HENT:  Head:  Normocephalic and atraumatic.  Eyes: Conjunctivae and EOM are normal. Pupils are equal, round, and reactive to light. Right eye exhibits no discharge. Left eye exhibits no discharge. No scleral icterus.  Neck: Normal range of motion. Neck supple. No thyromegaly present.  No thyromegaly, no anterior cervical adenopathy, and no bruits  Cardiovascular: Normal rate, regular rhythm, normal heart sounds and intact distal pulses.   No murmur heard. At 60/m    Pulmonary/Chest: Effort normal and breath sounds normal. No respiratory distress. He has no wheezes. He has no rales. He exhibits no tenderness.  Clear anteriorly and posteriorly  Abdominal: Soft. Bowel sounds are normal. He exhibits no mass. There is no tenderness. There is no rebound and no guarding.  No abdominal tenderness or bruit  Musculoskeletal: Normal range of motion. He exhibits no edema or tenderness.  Lymphadenopathy:    He has no cervical adenopathy.  Neurological: He is alert and oriented to person, place, and time. He has normal reflexes. No cranial nerve deficit.  Skin: Skin is warm and dry. No rash noted. No erythema. No pallor.  Psychiatric: He has a normal mood and affect. His behavior is normal. Judgment and thought content normal.  Nursing note and vitals reviewed.  BP 138/83 mmHg  Pulse 64  Temp(Src) 97.9 F (36.6 C) (Oral)  Ht 6' 1"  (1.854 m)  Wt 206 lb (93.441 kg)  BMI 27.18 kg/m2        Assessment & Plan:  1. Palpitations -Watch caffeine intake -Reduce the frequency of taking meloxicam - EKG 12-Lead - BMP8+EGFR - POCT CBC - Thyroid Panel With TSH  2. Coronary artery disease involving coronary bypass graft of native heart without angina pectoris -This is stable and the patient is not having any chest pain or chest tightness  3. Insomnia -This is secondary to the palpitations.  4. Elevated blood pressure -The blood pressure has been up and down and on recheck on occasions it is normal. -The patient will once again hold all NSAIDs including meloxicam - BMP8+EGFR  Patient Instructions  If the patient continues to have these palpitations and staying awake at night he should have a 24-hour Holter monitor if it's a nightly problem or a Edison Pace of Hearts monitor if it's a periodic problem and not nightly. He will call us in a couple weeks if these palpitations are continuing. He should continue to monitor the blood pressure at home He should watch his  sodium intake   Arrie Senate MD

## 2014-07-30 LAB — BMP8+EGFR
BUN / CREAT RATIO: 30 — AB (ref 10–22)
BUN: 26 mg/dL (ref 8–27)
CHLORIDE: 104 mmol/L (ref 97–108)
CO2: 22 mmol/L (ref 18–29)
Calcium: 9.5 mg/dL (ref 8.6–10.2)
Creatinine, Ser: 0.87 mg/dL (ref 0.76–1.27)
GFR calc Af Amer: 100 mL/min/{1.73_m2} (ref >59)
GFR, EST NON AFRICAN AMERICAN: 87 mL/min/{1.73_m2} (ref >59)
Glucose: 106 mg/dL — ABNORMAL HIGH (ref 65–99)
Potassium: 4.4 mmol/L (ref 3.5–5.2)
Sodium: 145 mmol/L — ABNORMAL HIGH (ref 134–144)

## 2014-07-30 LAB — THYROID PANEL WITH TSH
Free Thyroxine Index: 2 (ref 1.2–4.9)
T3 Uptake Ratio: 31 % (ref 24–39)
T4, Total: 6.5 ug/dL (ref 4.5–12.0)
TSH: 2.75 u[IU]/mL (ref 0.450–4.500)

## 2014-08-04 ENCOUNTER — Telehealth: Payer: Self-pay | Admitting: Family Medicine

## 2014-08-04 NOTE — Telephone Encounter (Signed)
Patient aware that [name] is off until Tuesday and I have scheduled him an appointment to see [name] on Tuesday.

## 2014-08-08 NOTE — Telephone Encounter (Signed)
Called patient and he was out playing golf and I spoke with his wife about Repatha.  Patient received some information in the mail about coverage but his wife isn't sure what it was.  I have not received any recent information regarding Repatha coverage on this patient since I submitted all the paperwork.  I asked patient's wife to have her husband call me when he gets in today so I can gather more information.

## 2014-08-09 ENCOUNTER — Telehealth: Payer: Self-pay | Admitting: Pharmacist Clinician (PhC)/ Clinical Pharmacy Specialist

## 2014-08-09 NOTE — Telephone Encounter (Signed)
Called patient after meeting with Repatha reps today to let him know they do receive funding through grants to help pay the co-pay of $361.11 a month.  He should not have to pay anything for the remainder of the year.  Manuela Schwartz will call me when the funding is available.  She left a sample of Repatha for patient to start today, however two weeks from now he will be at the beach and therefore wants to make sure he will have his medication within 2 weeks of the next dose.  Will wait to give first dose in office once we find out when funding is approved.

## 2014-08-15 ENCOUNTER — Telehealth: Payer: Self-pay | Admitting: Family Medicine

## 2014-08-15 ENCOUNTER — Ambulatory Visit (INDEPENDENT_AMBULATORY_CARE_PROVIDER_SITE_OTHER): Payer: Medicare Other | Admitting: *Deleted

## 2014-08-15 DIAGNOSIS — R002 Palpitations: Secondary | ICD-10-CM

## 2014-08-15 NOTE — Progress Notes (Signed)
Holter monitor placed on patient and instructions given. Patient will return monitor tomorrow after 2:30pm.

## 2014-08-15 NOTE — Telephone Encounter (Signed)
Pt called - holter monitor to be placed --- this is mostly happening at night and evenings when resting.

## 2014-08-15 NOTE — Patient Instructions (Signed)
Holter Monitoring A Holter monitor is a small device with electrodes (small sticky patches) that attach to your chest. It records the electrical activity of your heart and is worn continuously for 24-48 hours.  A HOLTER MONITOR IS USED TO  Detect heart problems such as:  Heart arrhythmia. Is an abnormal or irregular heartbeat. With some heart arrhythmias, you may not feel or know that you have an irregular heart rhythm.  Palpitations, such as feeling your heart racing or fluttering. It is possible to have heart palpitations and not have a heart arrhythmia.  A heart rhythm that is too slow or too fast.  If you have problems fainting, near fainting or feeling light-headed, a Holter monitor may be worn to see if your heart is the cause. HOLTER MONITOR PREPARATION   Electrodes will be attached to the skin on your chest.  If you have hair on your chest, small areas may have to be shaved. This is done to help the patches stick better and make the recording more accurate.  The electrodes are attached by wires to the Holter monitor. The Holter monitor clips to your clothing. You will wear the monitor at all times, even while exercising and sleeping. HOME CARE INSTRUCTIONS   Wear your monitor at all times.  The wires and the monitor must stay dry. Do not get the monitor wet.  Do not bathe, swim or use a hot tub with it on.  You may do a "sponge" bath while you have the monitor on.  Keep your skin clean, do not put body lotion or moisturizer on your chest.  It's possible that your skin under the electrodes could become irritated. To keep this from happening, you may put the electrodes in slightly different places on your chest.  Your caregiver will also ask you to keep a diary of your activities, such as walking or doing chores. Be sure to note what you are doing if you experience heart symptoms such as palpitations. This will help your caregiver determine what might be contributing to your  symptoms. The information stored in your monitor will be reviewed by your caregiver alongside your diary entries.  Make sure the monitor is safely clipped to your clothing or in a location close to your body that your caregiver recommends.  The monitor and electrodes are removed when the test is over. Return the monitor as directed.  Be sure to follow up with your caregiver and discuss your Holter monitor results. SEEK IMMEDIATE MEDICAL CARE IF:  You faint or feel lightheaded.  You have trouble breathing.  You get pain in your chest, upper arm or jaw.  You feel sick to your stomach and your skin is pale, cool, or damp.  You think something is wrong with the way your heart is beating. MAKE SURE YOU:   Understand these instructions.  Will watch your condition.  Will get help right away if you are not doing well or get worse. Document Released: 11/17/2003 Document Revised: 05/13/2011 Document Reviewed: 03/31/2008 ExitCare Patient Information 2015 [employer]. This information is not intended to replace advice given to you by your health care provider. Make sure you discuss any questions you have with your health care provider.  

## 2014-08-16 ENCOUNTER — Ambulatory Visit: Payer: Medicare Other | Admitting: Pharmacist Clinician (PhC)/ Clinical Pharmacy Specialist

## 2014-08-16 NOTE — Progress Notes (Signed)
Patient came in to office today for instruction on administration of Repatha injection.  Gave patient step by step instructions and allowed him to self administer injection so that I could make sure he gave it correctly.  Patient successfully gave injection in office today.  He received two additional samples in office today until he is able to receive approval from insurance company.

## 2014-08-22 ENCOUNTER — Telehealth: Payer: Self-pay | Admitting: Family Medicine

## 2014-08-22 NOTE — Telephone Encounter (Signed)
Pt called about holter monitor

## 2014-08-22 NOTE — Telephone Encounter (Signed)
Holter monitor results still in progress.  Will send to [employer] she tried to call.

## 2014-08-26 ENCOUNTER — Telehealth: Payer: Self-pay | Admitting: *Deleted

## 2014-08-26 NOTE — Telephone Encounter (Signed)
Left message for pt to call, he needs to be scheduled to see dr hochrein in the [facility] office next available to f/u monitor.

## 2014-08-29 ENCOUNTER — Telehealth: Payer: Self-pay | Admitting: Family Medicine

## 2014-08-29 NOTE — Telephone Encounter (Signed)
Pt is returning Debra's call from Friday

## 2014-08-29 NOTE — Telephone Encounter (Signed)
Pt has spoke with Roselyn Reef Results given

## 2014-08-30 NOTE — Telephone Encounter (Signed)
Follow up scheduled

## 2014-08-31 IMAGING — CT CT ABD-PELV W/O CM
2 of 4 series · 16 of 46 positions shown, 18 images · non-contrast
Comparison: 05/22/2012.

CLINICAL DATA: Left flank and lower abdominal pain.

EXAM:
CT ABDOMEN AND PELVIS WITHOUT CONTRAST
TECHNIQUE: Multidetector CT imaging of the abdomen and pelvis was performed
following the standard protocol without intravenous contrast.

[Series 2: stone study 5.0 i30f 1 · axial · 0.79mm/px · z∈[-540,-70]mm · 13 of 104 slices shown, 15 images]
[im 5/104  soft-tissue]
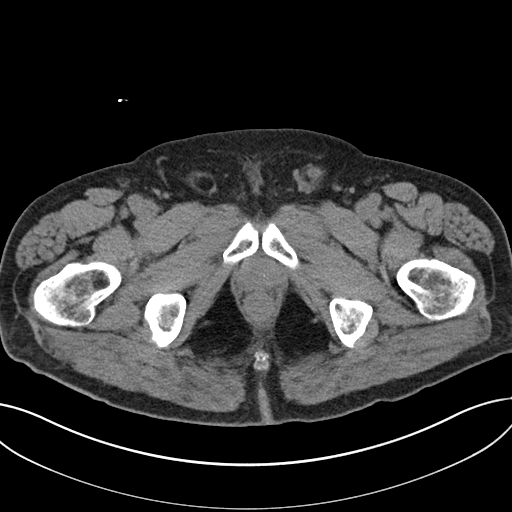
[im 5/104  bone]
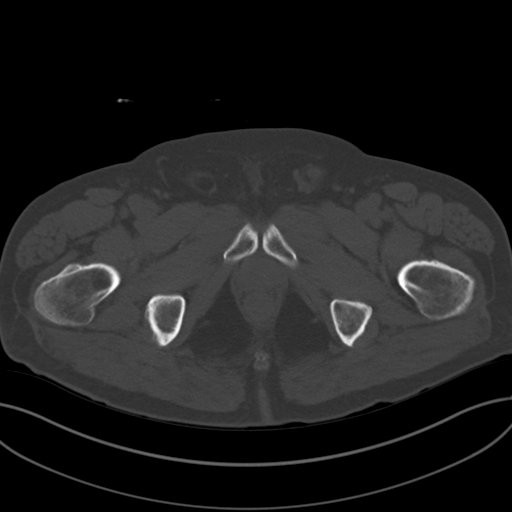
[im 14/104  soft-tissue]
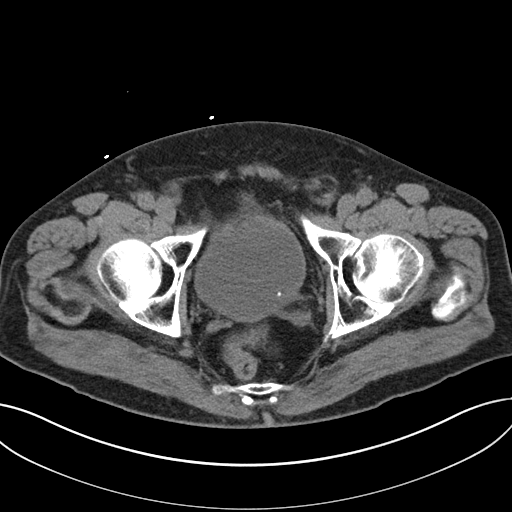
[im 23/104  soft-tissue]
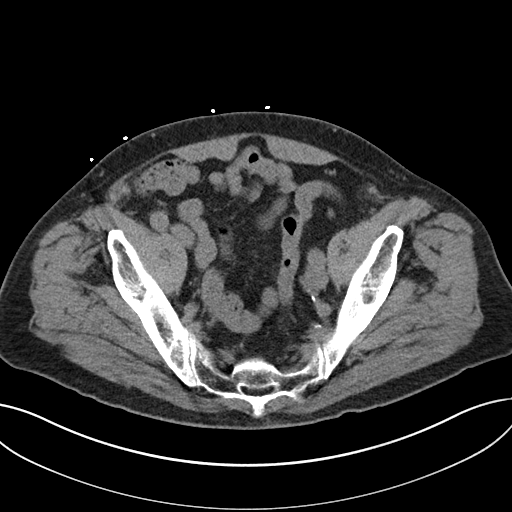
[im 27/104  soft-tissue]
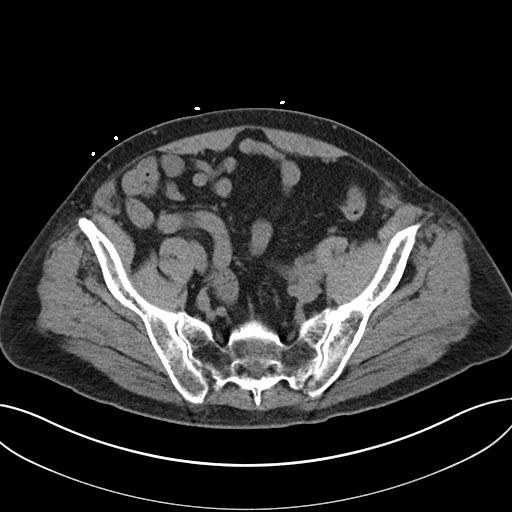
[im 36/104  soft-tissue]
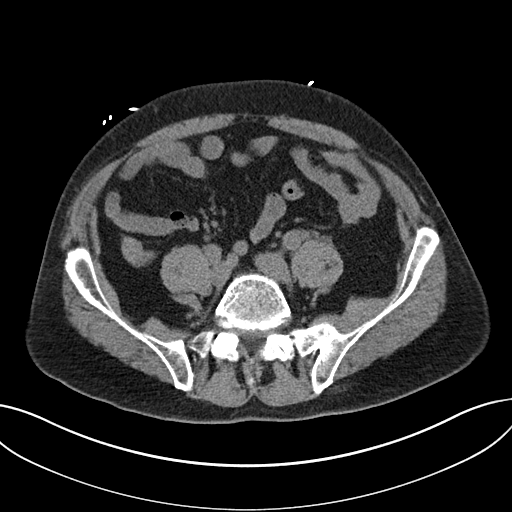
[im 45/104  soft-tissue]
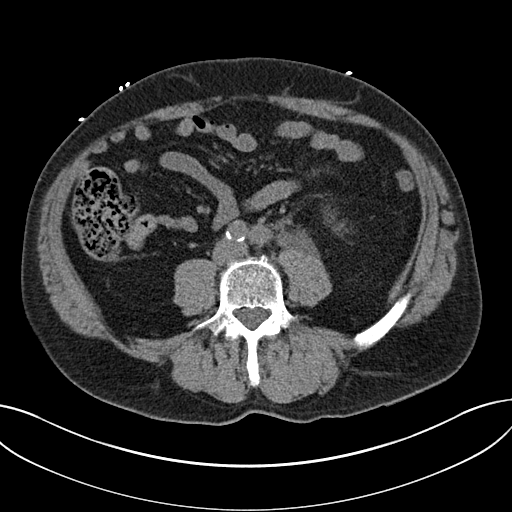
[im 54/104  soft-tissue]
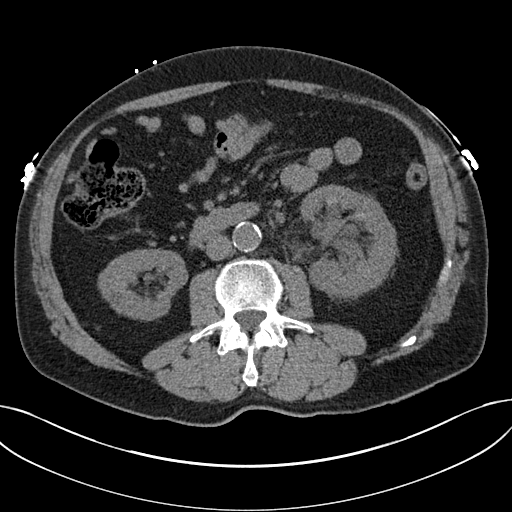
[im 59/104  soft-tissue]
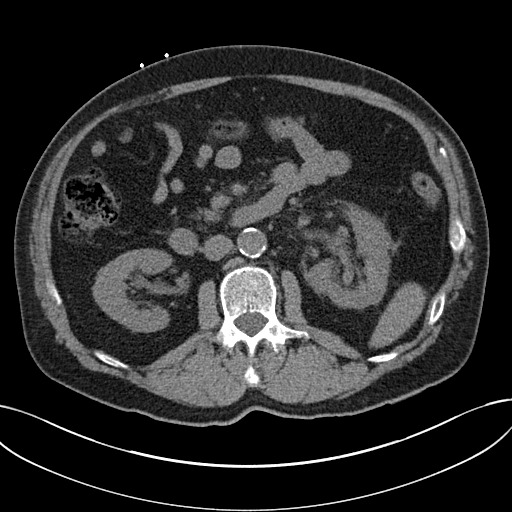
[im 68/104  soft-tissue]
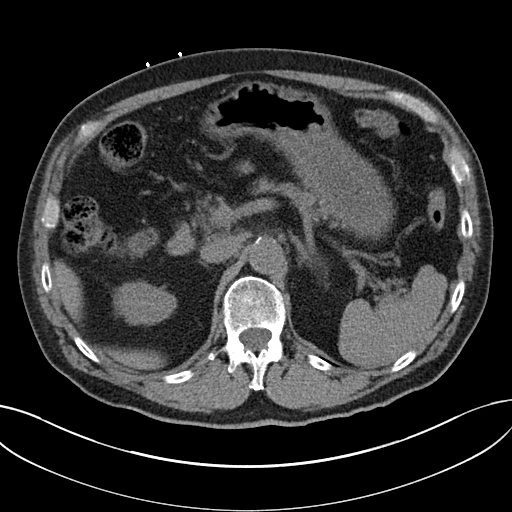
[im 68/104  bone]
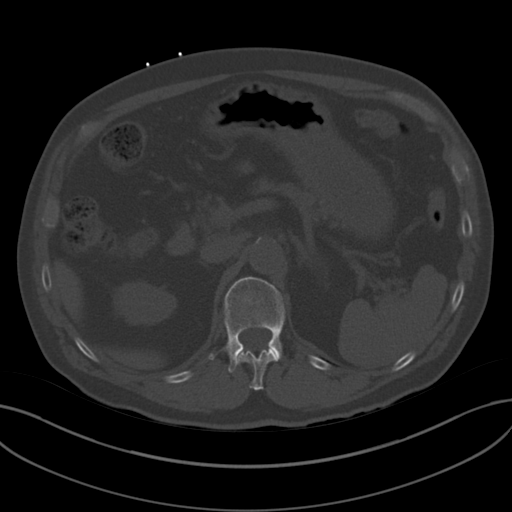
[im 77/104  soft-tissue]
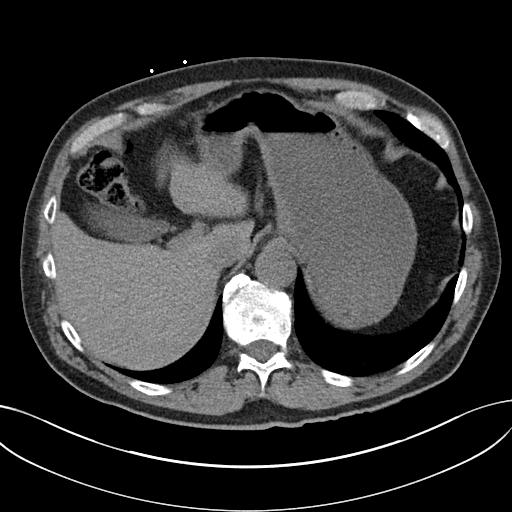
[im 81/104  soft-tissue]
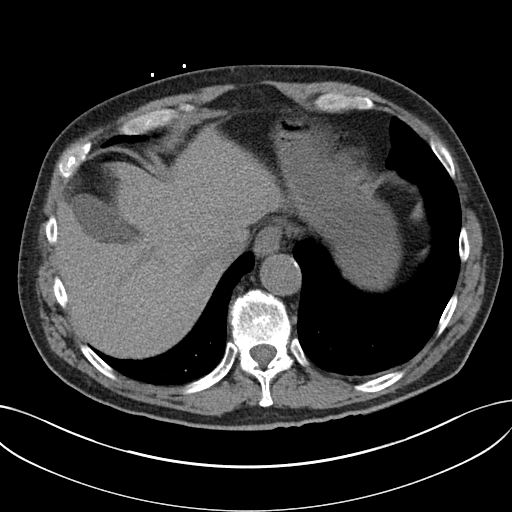
[im 90/104  soft-tissue]
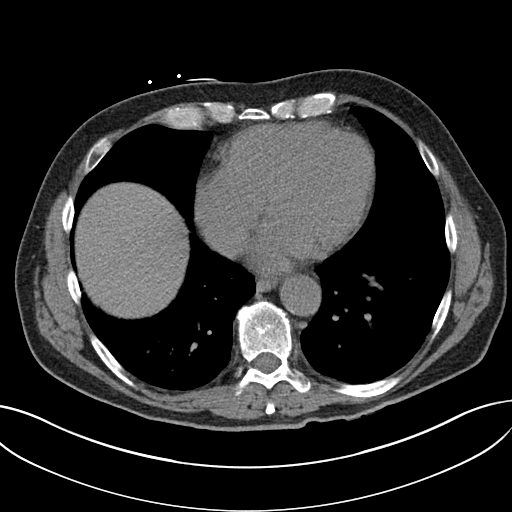
[im 99/104  soft-tissue]
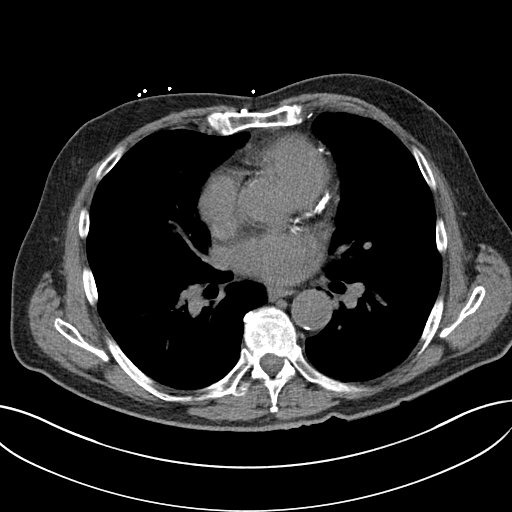

[Series 5: coronal soft tissue · coronal · 0.93mm/px · 3 of 101 slices shown]
[im 34/101  soft-tissue]
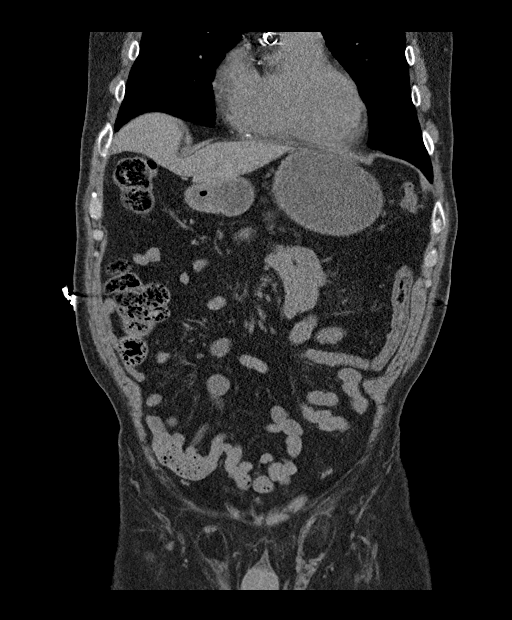
[im 45/101  soft-tissue]
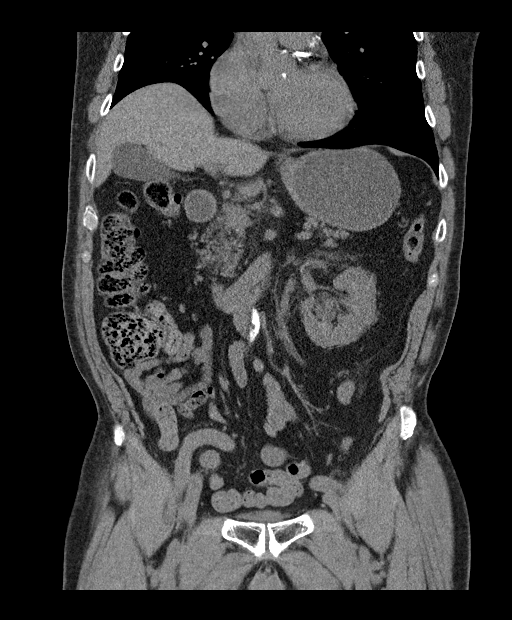
[im 56/101  soft-tissue]
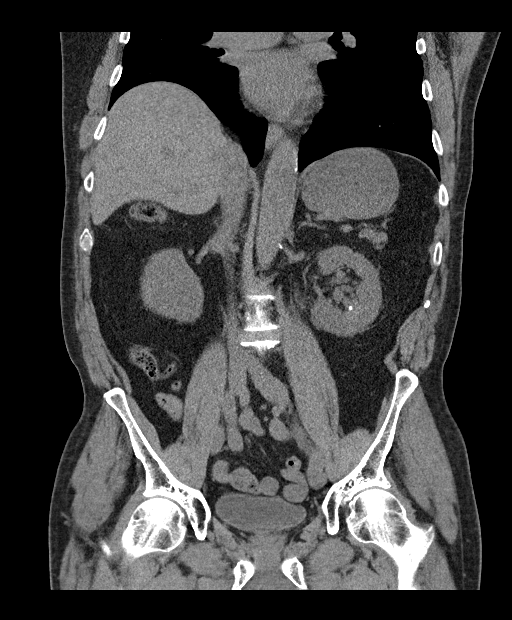

[16 of 46 positions shown; findings below may reference images not displayed]

FINDINGS: 4 mm calculus in the urinary bladder on the left at the
ureterovesical junction. Mild to moderate dilatation of the left
renal collecting system and ureter to the level of the
ureterovesical junction. There is also proximal left periureteric
fluid and mild left perinephric fluid. The left kidney is larger
than on the previous examination and larger than the right kidney.
There is also a 4 mm calculus in the mid to lower left kidney. No
right renal or right ureteral calculi or hydronephrosis. Previously
demonstrated bilateral parapelvic renal cysts, remaining larger on
the left compared to the right.

Bilateral inguinal hernias containing fat. No gastrointestinal
abnormalities or enlarged lymph nodes. Normal appearing appendix.

Unremarkable non contrasted appearance of the liver, spleen,
pancreas, gallbladder and adrenal glands. Minimally enlarged
prostate gland containing small calcifications. Mild dependent
atelectasis at both lung bases. Lumbar and lower thoracic spine
degenerative changes.
IMPRESSION: 1. 4 mm distal left ureteral calculus at the ureterovesical
junction, causing mild to moderate left hydronephrosis and
hydroureter.
2. 4 mm nonobstructing left renal calculus.
3. Bilateral parapelvic renal cysts, larger on the left.
4. Bilateral inguinal hernias containing fat.

## 2014-09-07 ENCOUNTER — Ambulatory Visit (INDEPENDENT_AMBULATORY_CARE_PROVIDER_SITE_OTHER): Payer: Medicare Other | Admitting: Cardiology

## 2014-09-07 ENCOUNTER — Encounter: Payer: Self-pay | Admitting: Cardiology

## 2014-09-07 VITALS — BP 140/80 | HR 64 | Ht 73.0 in | Wt 211.0 lb

## 2014-09-07 DIAGNOSIS — I25810 Atherosclerosis of coronary artery bypass graft(s) without angina pectoris: Secondary | ICD-10-CM

## 2014-09-07 DIAGNOSIS — I2510 Atherosclerotic heart disease of native coronary artery without angina pectoris: Secondary | ICD-10-CM | POA: Diagnosis not present

## 2014-09-07 DIAGNOSIS — I493 Ventricular premature depolarization: Secondary | ICD-10-CM

## 2014-09-07 MED ORDER — METOPROLOL TARTRATE 25 MG PO TABS: 25.0000 mg | ORAL_TABLET | ORAL | Status: DC

## 2014-09-07 NOTE — Progress Notes (Signed)
.   HPI The patient presents for evaluation of palpitations. He described these to Dr. Laurance Flatten recently. He would feel his heartbeat and skipped his ear and he was lying flat on his left side at night. He wore a Holter monitor which I reviewed today. This demonstrated about 11/2% PVCs with a few couplets, occasional bigeminy and one triplet.Marland Kitchen He did not have any presyncope or syncope. He tried cutting out caffeine but this didn't seem to make a difference. He doesn't notice these much during the day. He can't bring them on. He has not had any chest pressure, neck or arm discomfort. He's not had any new shortness of breath, PND or orthopnea. Of note he was having some mild atypical chest pain in the fall and he had an excellent result with a treadmill test going over 9 minutes without symptoms or ischemic findings.   Allergies  Allergen Reactions  . Levofloxacin     Caused C- Diff  . Crestor [Rosuvastatin] Other (See Comments)    Liver problems  . Vytorin [Ezetimibe-Simvastatin] Other (See Comments)    Liver problems    Current Outpatient Prescriptions  Medication Sig Dispense Refill  . aspirin 81 MG tablet Take 1 tablet twice a day    . Cholecalciferol (VITAMIN D3) 5000 UNITS CAPS Take 1 capsule by mouth daily.    . Coenzyme Q10 (COQ10) 100 MG CAPS Take 1 capsule by mouth 2 (two) times daily.      . cyclobenzaprine (FLEXERIL) 5 MG tablet Take 1 tablet (5 mg total) by mouth 3 (three) times daily as needed for muscle spasms. 30 tablet 1  . Evolocumab (REPATHA) 140 MG/ML SOSY Inject 140 mg into the skin as directed. 2 mL 6  . fluticasone (FLONASE) 50 MCG/ACT nasal spray USE 2 SPRAYS IN EACH NOSTRIL ONCE DAILY. 16 g 5  . metoprolol tartrate (LOPRESSOR) 25 MG tablet TAKE (1) TABLET TWICE A DAY. 180 tablet 2  . Misc Natural Products (OSTEO BI-FLEX ADV JOINT SHIELD PO) Take 1 capsule by mouth 2 (two) times daily.     . Multiple Vitamin (MULTI VITAMIN MENS PO) Take 1 tablet by mouth 2 (two) times  daily.    . Omega-3 Fatty Acids (FISH OIL) 1000 MG CAPS Take 2 capsules by mouth daily. 1000 mg?     . omeprazole (PRILOSEC) 20 MG capsule Take 20 mg by mouth daily.    . meloxicam (MOBIC) 15 MG tablet TAKE (1) TABLET DAILY AS DIRECTED. (Patient not taking: Reported on 09/07/2014) 30 tablet 1  . sildenafil (VIAGRA) 100 MG tablet Take 0.5-1 tablets (50-100 mg total) by mouth daily as needed for erectile dysfunction. (Patient not taking: Reported on 09/07/2014) 6 tablet 11   No current facility-administered medications for this visit.    Past Medical History  Diagnosis Date  . CAD (coronary artery disease)   . Dyslipidemia   . Colon polyps   . Hiatal hernia   . Herniated disc   . HTN (hypertension)   . Hyperlipidemia   . Kidney stones     Past Surgical History  Procedure Laterality Date  . Mole removed  2003  . Right knee surgery  2008  . Tonsillectomy    . Coronary artery bypass graft  2006    LIMA to LAD, SVG to RCA, SVG to circumflex, SVG to diagonal     ROS:  Sinus trouble, right knee pain.  Epigastric discomfort.  Otherwise as stated in the HPI and negative for all other systems.  PHYSICAL EXAM BP 140/80 mmHg  Pulse 64  Ht 6\' 1"  (1.854 m)  Wt 211 lb (95.709 kg)  BMI 27.84 kg/m2 GENERAL:  Well appearing NECK:  No jugular venous distention, waveform within normal limits, carotid upstroke brisk and symmetric, no bruits, no thyromegaly LUNGS:  Clear to auscultation bilaterally BACK:  No CVA tenderness CHEST:  Well healed sternotomy scar. HEART:  PMI not displaced or sustained,S1 and S2 within normal limits, no S3, no S4, no clicks, no rubs, no murmurs ABD:  Flat, positive bowel sounds normal in frequency in pitch, no bruits, no rebound, no guarding, no midline pulsatile mass, no hepatomegaly, no splenomegaly EXT:  2 plus pulses throughout, mild ankle edema, no cyanosis no clubbing   ASSESSMENT AND PLAN  PALPITATIONS:  He's going to increase his metoprolol by adding  another 25 mg at night.  Of note I did review the Holter. I reviewed his labs and his TSH and chemistry were normal.  CAD:  He had a negative stress test in the fall and I do not see any reason for further evaluation for obstructive coronary disease. He will continue with risk reduction.  HTN:  The blood pressure is at target. No change in medications is indicated. We will continue with therapeutic lifestyle changes (TLC).  DYSLIPIDEMIA:   He has been intolerant of statins and is now being treated with Repatha.

## 2014-09-07 NOTE — Patient Instructions (Signed)
Medication Instructions:  Increase your Metoprolol to 25 mg in the AM and 50 mg in the PM. Continue all other medications as listed.  Follow-Up: Follow up in [place] with Dr Percival Spanish.  Thank you for choosing [facility]!!

## 2014-09-13 DIAGNOSIS — L570 Actinic keratosis: Secondary | ICD-10-CM | POA: Diagnosis not present

## 2014-09-13 DIAGNOSIS — D239 Other benign neoplasm of skin, unspecified: Secondary | ICD-10-CM | POA: Diagnosis not present

## 2014-09-13 DIAGNOSIS — L821 Other seborrheic keratosis: Secondary | ICD-10-CM | POA: Diagnosis not present

## 2014-09-27 ENCOUNTER — Ambulatory Visit: Payer: Medicare Other | Admitting: Family Medicine

## 2014-10-11 ENCOUNTER — Other Ambulatory Visit: Payer: Self-pay | Admitting: *Deleted

## 2014-10-11 DIAGNOSIS — R030 Elevated blood-pressure reading, without diagnosis of hypertension: Secondary | ICD-10-CM

## 2014-10-11 DIAGNOSIS — D696 Thrombocytopenia, unspecified: Secondary | ICD-10-CM

## 2014-10-11 DIAGNOSIS — IMO0001 Reserved for inherently not codable concepts without codable children: Secondary | ICD-10-CM

## 2014-10-11 DIAGNOSIS — E559 Vitamin D deficiency, unspecified: Secondary | ICD-10-CM

## 2014-10-11 DIAGNOSIS — N400 Enlarged prostate without lower urinary tract symptoms: Secondary | ICD-10-CM

## 2014-10-11 DIAGNOSIS — E785 Hyperlipidemia, unspecified: Secondary | ICD-10-CM

## 2014-10-11 DIAGNOSIS — I25810 Atherosclerosis of coronary artery bypass graft(s) without angina pectoris: Secondary | ICD-10-CM

## 2014-10-12 ENCOUNTER — Ambulatory Visit: Payer: Medicare Other | Admitting: Cardiology

## 2014-10-14 ENCOUNTER — Other Ambulatory Visit (INDEPENDENT_AMBULATORY_CARE_PROVIDER_SITE_OTHER): Payer: Medicare Other

## 2014-10-14 DIAGNOSIS — N400 Enlarged prostate without lower urinary tract symptoms: Secondary | ICD-10-CM | POA: Diagnosis not present

## 2014-10-14 DIAGNOSIS — E559 Vitamin D deficiency, unspecified: Secondary | ICD-10-CM | POA: Diagnosis not present

## 2014-10-14 DIAGNOSIS — R030 Elevated blood-pressure reading, without diagnosis of hypertension: Secondary | ICD-10-CM

## 2014-10-14 DIAGNOSIS — I25810 Atherosclerosis of coronary artery bypass graft(s) without angina pectoris: Secondary | ICD-10-CM | POA: Diagnosis not present

## 2014-10-14 DIAGNOSIS — E785 Hyperlipidemia, unspecified: Secondary | ICD-10-CM

## 2014-10-14 DIAGNOSIS — IMO0001 Reserved for inherently not codable concepts without codable children: Secondary | ICD-10-CM

## 2014-10-14 DIAGNOSIS — D696 Thrombocytopenia, unspecified: Secondary | ICD-10-CM

## 2014-10-15 LAB — CBC WITH DIFFERENTIAL/PLATELET
BASOS ABS: 0 10*3/uL (ref 0.0–0.2)
BASOS: 0 %
EOS (ABSOLUTE): 0.1 10*3/uL (ref 0.0–0.4)
Eos: 1 %
HEMATOCRIT: 45.2 % (ref 37.5–51.0)
HEMOGLOBIN: 14.8 g/dL (ref 12.6–17.7)
IMMATURE GRANS (ABS): 0 10*3/uL (ref 0.0–0.1)
Immature Granulocytes: 0 %
LYMPHS ABS: 2 10*3/uL (ref 0.7–3.1)
LYMPHS: 36 %
MCH: 31.6 pg (ref 26.6–33.0)
MCHC: 32.7 g/dL (ref 31.5–35.7)
MCV: 97 fL (ref 79–97)
Monocytes Absolute: 0.4 10*3/uL (ref 0.1–0.9)
Monocytes: 8 %
Neutrophils Absolute: 3.1 10*3/uL (ref 1.4–7.0)
Neutrophils: 55 %
Platelets: 146 10*3/uL — ABNORMAL LOW (ref 150–379)
RBC: 4.68 x10E6/uL (ref 4.14–5.80)
RDW: 13.7 % (ref 12.3–15.4)
WBC: 5.6 10*3/uL (ref 3.4–10.8)

## 2014-10-15 LAB — HEPATIC FUNCTION PANEL
ALBUMIN: 4.5 g/dL (ref 3.5–4.8)
ALT: 20 IU/L (ref 0–44)
AST: 21 IU/L (ref 0–40)
Alkaline Phosphatase: 62 IU/L (ref 39–117)
Bilirubin Total: 0.6 mg/dL (ref 0.0–1.2)
Bilirubin, Direct: 0.17 mg/dL (ref 0.00–0.40)
Total Protein: 6.4 g/dL (ref 6.0–8.5)

## 2014-10-15 LAB — NMR, LIPOPROFILE
CHOLESTEROL: 123 mg/dL (ref 100–199)
HDL CHOLESTEROL BY NMR: 49 mg/dL (ref >39)
HDL Particle Number: 30.8 umol/L (ref >=30.5)
LDL Particle Number: 789 nmol/L (ref <1000)
LDL Size: 20.8 nm (ref >20.5)
LDL-C: 58 mg/dL (ref 0–99)
LP-IR SCORE: 59 — AB (ref <=45)
SMALL LDL PARTICLE NUMBER: 284 nmol/L (ref <=527)
Triglycerides by NMR: 81 mg/dL (ref 0–149)

## 2014-10-15 LAB — BMP8+EGFR
BUN / CREAT RATIO: 29 — AB (ref 10–22)
BUN: 24 mg/dL (ref 8–27)
CALCIUM: 9.1 mg/dL (ref 8.6–10.2)
CO2: 24 mmol/L (ref 18–29)
Chloride: 102 mmol/L (ref 97–108)
Creatinine, Ser: 0.84 mg/dL (ref 0.76–1.27)
GFR calc Af Amer: 102 mL/min/{1.73_m2} (ref >59)
GFR, EST NON AFRICAN AMERICAN: 88 mL/min/{1.73_m2} (ref >59)
Glucose: 96 mg/dL (ref 65–99)
Potassium: 4.3 mmol/L (ref 3.5–5.2)
SODIUM: 140 mmol/L (ref 134–144)

## 2014-10-15 LAB — VITAMIN D 25 HYDROXY (VIT D DEFICIENCY, FRACTURES): Vit D, 25-Hydroxy: 61.2 ng/mL (ref 30.0–100.0)

## 2014-10-15 LAB — PSA: PROSTATE SPECIFIC AG, SERUM: 1.4 ng/mL (ref 0.0–4.0)

## 2014-10-21 ENCOUNTER — Encounter: Payer: Self-pay | Admitting: Family Medicine

## 2014-10-21 ENCOUNTER — Ambulatory Visit (INDEPENDENT_AMBULATORY_CARE_PROVIDER_SITE_OTHER): Payer: Medicare Other | Admitting: Family Medicine

## 2014-10-21 VITALS — BP 138/82 | HR 62 | Temp 97.7°F | Ht 73.0 in | Wt 208.0 lb

## 2014-10-21 DIAGNOSIS — E785 Hyperlipidemia, unspecified: Secondary | ICD-10-CM

## 2014-10-21 DIAGNOSIS — D696 Thrombocytopenia, unspecified: Secondary | ICD-10-CM

## 2014-10-21 DIAGNOSIS — N400 Enlarged prostate without lower urinary tract symptoms: Secondary | ICD-10-CM | POA: Diagnosis not present

## 2014-10-21 DIAGNOSIS — R030 Elevated blood-pressure reading, without diagnosis of hypertension: Secondary | ICD-10-CM

## 2014-10-21 DIAGNOSIS — E559 Vitamin D deficiency, unspecified: Secondary | ICD-10-CM | POA: Diagnosis not present

## 2014-10-21 DIAGNOSIS — I25810 Atherosclerosis of coronary artery bypass graft(s) without angina pectoris: Secondary | ICD-10-CM | POA: Diagnosis not present

## 2014-10-21 DIAGNOSIS — R29898 Other symptoms and signs involving the musculoskeletal system: Secondary | ICD-10-CM

## 2014-10-21 DIAGNOSIS — IMO0001 Reserved for inherently not codable concepts without codable children: Secondary | ICD-10-CM

## 2014-10-21 LAB — POCT URINALYSIS DIPSTICK
Bilirubin, UA: NEGATIVE
Glucose, UA: NEGATIVE
Ketones, UA: NEGATIVE
LEUKOCYTES UA: NEGATIVE
Nitrite, UA: NEGATIVE
PROTEIN UA: NEGATIVE
Spec Grav, UA: 1.025
UROBILINOGEN UA: NEGATIVE
pH, UA: 5

## 2014-10-21 LAB — POCT UA - MICROSCOPIC ONLY
Bacteria, U Microscopic: NEGATIVE
CASTS, UR, LPF, POC: NEGATIVE
Crystals, Ur, HPF, POC: NEGATIVE
MUCUS UA: NEGATIVE
WBC, Ur, HPF, POC: NEGATIVE
YEAST UA: NEGATIVE

## 2014-10-21 NOTE — Patient Instructions (Addendum)
Medicare Annual Wellness Visit  [facility] and the medical providers at [facility] strive to bring you the best medical care.  In doing so we not only want to address your current medical conditions and concerns but also to detect new conditions early and prevent illness, disease and health-related problems.    Medicare offers a yearly Wellness Visit which allows our clinical staff to assess your need for preventative services including immunizations, lifestyle education, counseling to decrease risk of preventable diseases and screening for fall risk and other medical concerns.    This visit is provided free of charge (no copay) for all Medicare recipients. The clinical pharmacists at [facility] have begun to conduct these Wellness Visits which will also include a thorough review of all your medications.    As you primary medical provider recommend that you make an appointment for your Annual Wellness Visit if you have not done so already this year.  You may set up this appointment before you leave today or you may call back ([phone]) and schedule an appointment.  Please make sure when you call that you mention that you are scheduling your Annual Wellness Visit with the clinical pharmacist so that the appointment may be made for the proper length of time.      Continue current medications. Continue good therapeutic lifestyle changes which include good diet and exercise. Fall precautions discussed with patient. If an FOBT was given today- please return it to our front desk. If you are over 24 years old - you may need Prevnar 69 or the adult Pneumonia vaccine.  **Flu shots will be available soon--- please call and schedule a FLU-CLINIC appointment**  After your visit with [place] today you will receive a survey in the mail or online from [employer] regarding your care with [place]. Please take a moment to fill this out. Your feedback is  very important to [place] as you can help [place] better understand your patient needs as well as improve your experience and satisfaction. WE CARE ABOUT YOU!!!   **Please join [place] SEPT.[email or web address], 2016 from 5:00 to 7:00pm for our OPEN HOUSE! Come out and meet our NEW providers**  Continue with repatha Continue with exercise regimen If leg weakness continues, we will arrange for a visit to the neurologist

## 2014-10-21 NOTE — Progress Notes (Signed)
Subjective:    Patient ID: [name] [name], male    DOB: 23-Apr-1940, 74 y.o.   MRN: [id number]  HPI Pt here for follow up and management of chronic medical problems which includes hyperlipidemia. He is taking medications regularly. The patient has a history of ASCVD and coronary artery bypass. His cholesterol numbers used to be under very good control and then he developed an intolerance to the statin drugs. He is recently been started on repatha, one of the new PCS K-9 inhibitors. So far he has done extremely well with this and his numbers reflect this and are excellent. Metoprolol has recently been increased. This was done by the cardiologist because of palpitations. He is doing better with this.        Patient Active Problem List   Diagnosis Date Noted  . Obstructive sleep apnea 03/17/2014  . Erectile dysfunction due to arterial insufficiency 01/12/2014  . Thrombocytopenia 12/30/2013  . B12 deficiency 10/01/2012  . Allergic rhinitis 10/01/2012  . BPH (benign prostatic hyperplasia) 10/01/2012  . PVC's (premature ventricular contractions) 09/01/2009  . COLONIC POLYPS 04/14/2009  . Hyperlipidemia 04/14/2009  . Essential hypertension 04/14/2009  . Coronary atherosclerosis 04/14/2009  . HIATAL HERNIA 04/14/2009  . HERNIATED DISC 04/14/2009   Outpatient Encounter Prescriptions as of 10/21/2014  Medication Sig  . aspirin 81 MG tablet Take 1 tablet twice a day  . Cholecalciferol (VITAMIN D3) 5000 UNITS CAPS Take 1 capsule by mouth daily.  . Coenzyme Q10 (COQ10) 100 MG CAPS Take 1 capsule by mouth 2 (two) times daily.    . cyclobenzaprine (FLEXERIL) 5 MG tablet Take 1 tablet (5 mg total) by mouth 3 (three) times daily as needed for muscle spasms.  . Evolocumab (REPATHA) 140 MG/ML SOSY Inject 140 mg into the skin as directed.  . fluticasone (FLONASE) 50 MCG/ACT nasal spray USE 2 SPRAYS IN EACH NOSTRIL ONCE DAILY.  . meloxicam (MOBIC) 15 MG tablet TAKE (1) TABLET DAILY AS DIRECTED.    Marland Kitchen metoprolol tartrate (LOPRESSOR) 25 MG tablet Take 1 tablet (25 mg total) by mouth as directed. Take one tablet every AM and 2 tablets every PM  . Misc Natural Products (OSTEO BI-FLEX ADV JOINT SHIELD PO) Take 1 capsule by mouth 2 (two) times daily.   . Multiple Vitamin (MULTI VITAMIN MENS PO) Take 1 tablet by mouth 2 (two) times daily.  . Omega-3 Fatty Acids (FISH OIL) 1000 MG CAPS Take 2 capsules by mouth daily. 1000 mg?   . omeprazole (PRILOSEC) 20 MG capsule Take 20 mg by mouth daily.  . sildenafil (VIAGRA) 100 MG tablet Take 0.5-1 tablets (50-100 mg total) by mouth daily as needed for erectile dysfunction.  Marland Kitchen REPATHA SURECLICK 409 MG/ML SOAJ    No facility-administered encounter medications on file as of 10/21/2014.     Review of Systems  HENT: Negative.   Eyes: Negative.   Respiratory: Negative.   Cardiovascular: Negative.   Gastrointestinal: Negative.   Endocrine: Negative.   Genitourinary: Negative.   Musculoskeletal: Negative.   Skin: Negative.   Allergic/Immunologic: Negative.   Neurological: Positive for weakness (bilateral leg weakness).  Hematological: Negative.   Psychiatric/Behavioral: Negative.        Objective:   Physical Exam  Constitutional: He is oriented to person, place, and time. He appears well-developed and well-nourished. No distress.  HENT:  Head: Normocephalic and atraumatic.  Right Ear: External ear normal.  Left Ear: External ear normal.  Nose: Nose normal.  Mouth/Throat: Oropharynx is clear and moist. No oropharyngeal  exudate.  Eyes: Conjunctivae and EOM are normal. Pupils are equal, round, and reactive to light. Right eye exhibits no discharge. Left eye exhibits no discharge. No scleral icterus.  Disc are sharp on the right but the left was more difficult to visualize  Neck: Normal range of motion. Neck supple. No thyromegaly present.  Cardiovascular: Normal rate, regular rhythm, normal heart sounds and intact distal pulses.  Exam reveals no  gallop and no friction rub.   No murmur heard. At 72/m  Pulmonary/Chest: Effort normal and breath sounds normal. No respiratory distress. He has no wheezes. He has no rales. He exhibits no tenderness.  Clear anteriorly and posteriorly and no axillary adenopathy  Abdominal: Soft. Bowel sounds are normal. He exhibits no mass. There is no tenderness. There is no rebound and no guarding.  The abdomen is nontender without masses or liver enlargement spleen enlargement or inguinal adenopathy  Genitourinary: Rectum normal and penis normal.  The prostate was enlarged and smooth. There are no rectal masses. There were no inguinal hernias. The genitalia were normal. There were some external hemorrhoids.  Musculoskeletal: Normal range of motion. He exhibits no edema or tenderness.  Good pulses bilaterally in lower extremities  Lymphadenopathy:    He has no cervical adenopathy.  Neurological: He is alert and oriented to person, place, and time. He has normal reflexes. No cranial nerve deficit.  Skin: Skin is warm and dry. No rash noted. No erythema. No pallor.  Psychiatric: He has a normal mood and affect. His behavior is normal. Judgment and thought content normal.  Nursing note and vitals reviewed.  BP 138/82 mmHg  Pulse 62  Temp(Src) 97.7 F (36.5 C) (Oral)  Ht 6\' 1"  (1.854 m)  Wt 208 lb (94.348 kg)  BMI 27.45 kg/m2        Assessment & Plan:  1. BPH (benign prostatic hyperplasia) -The patient continues to have some erectile dysfunction but no specific problems with passing his water. - POCT UA - Microscopic Only - POCT urinalysis dipstick  2. Vitamin D deficiency -The vitamin D level was excellent he will continue with current treatment  3. Thrombocytopenia -The platelet count remains slightly decreased and this is been consistent with past readings.  4. Hyperlipemia -Cholesterol numbers are excellent and patient was very excited with the results of the injectable PCS K-9  inhibitor--- he will continue with this twice monthly  5. Elevated blood pressure -The blood pressure is good today and he will continue with current treatment  6. Coronary artery disease involving coronary bypass graft of native heart without angina pectoris -He will continue with follow-up by the cardiologist  7. Weakness of both lower extremities -He will continue with his current exercise regimen and if he continues to have problems with weakness we will consider referring him to the neurologist for further evaluation of this.  Sample of Cialis 20 was given to patient to try.  Patient Instructions                       Medicare Annual Wellness Visit  [facility] and the medical providers at [facility] strive to bring you the best medical care.  In doing so we not only want to address your current medical conditions and concerns but also to detect new conditions early and prevent illness, disease and health-related problems.    Medicare offers a yearly Wellness Visit which allows our clinical staff to assess your need for preventative services including immunizations,  lifestyle education, counseling to decrease risk of preventable diseases and screening for fall risk and other medical concerns.    This visit is provided free of charge (no copay) for all Medicare recipients. The clinical pharmacists at [facility] have begun to conduct these Wellness Visits which will also include a thorough review of all your medications.    As you primary medical provider recommend that you make an appointment for your Annual Wellness Visit if you have not done so already this year.  You may set up this appointment before you leave today or you may call back ([phone]) and schedule an appointment.  Please make sure when you call that you mention that you are scheduling your Annual Wellness Visit with the clinical pharmacist so that the appointment may be made  for the proper length of time.      Continue current medications. Continue good therapeutic lifestyle changes which include good diet and exercise. Fall precautions discussed with patient. If an FOBT was given today- please return it to our front desk. If you are over 34 years old - you may need Prevnar 49 or the adult Pneumonia vaccine.  **Flu shots will be available soon--- please call and schedule a FLU-CLINIC appointment**  After your visit with [place] today you will receive a survey in the mail or online from [employer] regarding your care with [place]. Please take a moment to fill this out. Your feedback is very important to [place] as you can help [place] better understand your patient needs as well as improve your experience and satisfaction. WE CARE ABOUT YOU!!!   **Please join [place] [email or web address], 2016 from 5:00 to 7:00pm for our OPEN HOUSE! Come out and meet our NEW providers**  Continue with repatha Continue with exercise regimen If leg weakness continues, we will arrange for a visit to the neurologist   Arrie Senate MD

## 2014-10-25 ENCOUNTER — Other Ambulatory Visit: Payer: Medicare Other

## 2014-10-25 DIAGNOSIS — Z1212 Encounter for screening for malignant neoplasm of rectum: Secondary | ICD-10-CM | POA: Diagnosis not present

## 2014-10-25 NOTE — Progress Notes (Signed)
Lab only 

## 2014-10-26 LAB — FECAL OCCULT BLOOD, IMMUNOCHEMICAL: FECAL OCCULT BLD: NEGATIVE

## 2014-11-10 DIAGNOSIS — R312 Other microscopic hematuria: Secondary | ICD-10-CM | POA: Diagnosis not present

## 2014-11-10 DIAGNOSIS — N5201 Erectile dysfunction due to arterial insufficiency: Secondary | ICD-10-CM | POA: Diagnosis not present

## 2014-11-10 DIAGNOSIS — N3943 Post-void dribbling: Secondary | ICD-10-CM | POA: Diagnosis not present

## 2014-12-02 ENCOUNTER — Telehealth: Payer: Self-pay | Admitting: Cardiology

## 2014-12-07 NOTE — Telephone Encounter (Signed)
Closed encounter °

## 2014-12-21 ENCOUNTER — Ambulatory Visit: Payer: Medicare Other | Admitting: Cardiology

## 2014-12-21 ENCOUNTER — Encounter: Payer: Self-pay | Admitting: Cardiology

## 2014-12-21 ENCOUNTER — Ambulatory Visit (INDEPENDENT_AMBULATORY_CARE_PROVIDER_SITE_OTHER): Payer: Medicare Other | Admitting: Cardiology

## 2014-12-21 VITALS — BP 131/79 | HR 64 | Ht 73.0 in | Wt 207.0 lb

## 2014-12-21 DIAGNOSIS — I2510 Atherosclerotic heart disease of native coronary artery without angina pectoris: Secondary | ICD-10-CM

## 2014-12-21 DIAGNOSIS — I25810 Atherosclerosis of coronary artery bypass graft(s) without angina pectoris: Secondary | ICD-10-CM

## 2014-12-21 NOTE — Progress Notes (Signed)
.   HPI The patient presents for evaluation of palpitations. He has worn a monitor which demonstrates PVCs. Since I last saw him he he'll strong heartbeat in his years at night but is not describing a rapid or irregular rate. He's not having this sensation during the day. He's not having any presyncope or syncope. He is a still golfing.  He did have a sleep study.  He was not sure of the results.  The patient denies any new symptoms such as chest discomfort, neck or arm discomfort. There has been no new shortness of breath, PND or orthopnea. There has had no presyncope or syncope.  Allergies  Allergen Reactions  . Levofloxacin     Caused C- Diff  . Crestor [Rosuvastatin] Other (See Comments)    Liver problems  . Vytorin [Ezetimibe-Simvastatin] Other (See Comments)    Liver problems    Current Outpatient Prescriptions  Medication Sig Dispense Refill  . aspirin 81 MG tablet Take 1 tablet twice a day    . Cholecalciferol (VITAMIN D3) 5000 UNITS CAPS Take 1 capsule by mouth daily.    . Coenzyme Q10 (COQ10) 100 MG CAPS Take 1 capsule by mouth 2 (two) times daily.      . cyclobenzaprine (FLEXERIL) 5 MG tablet Take 1 tablet (5 mg total) by mouth 3 (three) times daily as needed for muscle spasms. 30 tablet 1  . Evolocumab (REPATHA) 140 MG/ML SOSY Inject 140 mg into the skin as directed. 2 mL 6  . fluticasone (FLONASE) 50 MCG/ACT nasal spray USE 2 SPRAYS IN EACH NOSTRIL ONCE DAILY. 16 g 5  . meloxicam (MOBIC) 15 MG tablet TAKE (1) TABLET DAILY AS DIRECTED. 30 tablet 1  . metoprolol tartrate (LOPRESSOR) 25 MG tablet Take 1 tablet (25 mg total) by mouth as directed. Take one tablet every AM and 2 tablets every PM 270 tablet 3  . Misc Natural Products (OSTEO BI-FLEX ADV JOINT SHIELD PO) Take 1 capsule by mouth 2 (two) times daily.     . Multiple Vitamin (MULTI VITAMIN MENS PO) Take 1 tablet by mouth 2 (two) times daily.    . Omega-3 Fatty Acids (FISH OIL) 1000 MG CAPS Take 2 capsules by mouth daily.  1000 mg?     . omeprazole (PRILOSEC) 20 MG capsule Take 20 mg by mouth daily.    . sildenafil (VIAGRA) 100 MG tablet Take 0.5-1 tablets (50-100 mg total) by mouth daily as needed for erectile dysfunction. 6 tablet 11   No current facility-administered medications for this visit.    Past Medical History  Diagnosis Date  . CAD (coronary artery disease)   . Dyslipidemia   . Colon polyps   . Hiatal hernia   . Herniated disc   . HTN (hypertension)   . Hyperlipidemia   . Kidney stones     Past Surgical History  Procedure Laterality Date  . Mole removed  2003  . Right knee surgery  2008  . Tonsillectomy    . Coronary artery bypass graft  2006    LIMA to LAD, SVG to RCA, SVG to circumflex, SVG to diagonal     ROS:  Sinus trouble, right knee pain.  Epigastric discomfort.  Otherwise as stated in the HPI and negative for all other systems.  PHYSICAL EXAM BP 131/79 mmHg  Pulse 64  Ht 6\' 1"  (1.854 m)  Wt 207 lb (93.895 kg)  BMI 27.32 kg/m2  SpO2 99% GENERAL:  Well appearing NECK:  No jugular venous distention, waveform  within normal limits, carotid upstroke brisk and symmetric, no bruits, no thyromegaly LUNGS:  Clear to auscultation bilaterally BACK:  No CVA tenderness CHEST:  Well healed sternotomy scar. HEART:  PMI not displaced or sustained,S1 and S2 within normal limits, no S3, no S4, no clicks, no rubs, no murmurs ABD:  Flat, positive bowel sounds normal in frequency in pitch, no bruits, no rebound, no guarding, no midline pulsatile mass, no hepatomegaly, no splenomegaly EXT:  2 plus pulses throughout, mild ankle edema, no cyanosis no clubbing   ASSESSMENT AND PLAN  PALPITATIONS:  He is still somewhat bothered by these only at night. I have again reviewed these with him and discussed the results of his Holter. I would not suggest any change in his therapy based on previous testing.  CAD:  He had a negative stress test last fall and I do not see any reason for further  evaluation for obstructive coronary disease. He will continue with risk reduction.  HTN:  The blood pressure is at target. No change in medications is indicated. We will continue with therapeutic lifestyle changes (TLC).  DYSLIPIDEMIA:   He is now being treated with Repatha.  However, he still has muscle aches despite the fact that he's no longer on a statin. Therefore, perhaps he is not intolerant of statin should be reconsidered for this as preferred therapy. Suggested dosing would be 40 mg for an LDL of less than 70. I will defer to Dr. Laurance Flatten.  SLEEP APNEA:  I did review with him the results of the sleep study. In March she had moderate sleep apnea. He was not sure with the therapy or follow-up should be. For me he's not describing waking feeling particularly tired. He does fall asleep if he watches TV but he doesn't think this is excessive. I asked him to get back with Dr. Laurance Flatten to discuss whether he thinks he should go for CPAP fitting.

## 2014-12-21 NOTE — Patient Instructions (Signed)
Medication Instructions:  The current medical regimen is effective;  continue present plan and medications.  Follow-Up: Follow up in 1 year with Dr. Hochrein.  You will receive a letter in the mail 2 months before you are due.  Please call us when you receive this letter to schedule your follow up appointment.  Thank you for choosing [facility]!!      

## 2015-01-03 ENCOUNTER — Telehealth: Payer: Self-pay | Admitting: Pharmacist Clinician (PhC)/ Clinical Pharmacy Specialist

## 2015-01-03 ENCOUNTER — Ambulatory Visit (INDEPENDENT_AMBULATORY_CARE_PROVIDER_SITE_OTHER): Payer: Medicare Other

## 2015-01-03 DIAGNOSIS — Z23 Encounter for immunization: Secondary | ICD-10-CM | POA: Diagnosis not present

## 2015-02-22 ENCOUNTER — Other Ambulatory Visit (INDEPENDENT_AMBULATORY_CARE_PROVIDER_SITE_OTHER): Payer: Medicare Other

## 2015-02-22 DIAGNOSIS — E785 Hyperlipidemia, unspecified: Secondary | ICD-10-CM | POA: Diagnosis not present

## 2015-02-22 DIAGNOSIS — D696 Thrombocytopenia, unspecified: Secondary | ICD-10-CM

## 2015-02-22 DIAGNOSIS — R030 Elevated blood-pressure reading, without diagnosis of hypertension: Secondary | ICD-10-CM | POA: Diagnosis not present

## 2015-02-22 DIAGNOSIS — E559 Vitamin D deficiency, unspecified: Secondary | ICD-10-CM

## 2015-02-22 DIAGNOSIS — I25810 Atherosclerosis of coronary artery bypass graft(s) without angina pectoris: Secondary | ICD-10-CM

## 2015-02-22 DIAGNOSIS — IMO0001 Reserved for inherently not codable concepts without codable children: Secondary | ICD-10-CM

## 2015-02-22 DIAGNOSIS — N400 Enlarged prostate without lower urinary tract symptoms: Secondary | ICD-10-CM | POA: Diagnosis not present

## 2015-02-22 NOTE — Addendum Note (Signed)
Addended by: Pollyann Kennedy F on: 02/22/2015 01:06 PM   Modules accepted: Orders

## 2015-02-23 ENCOUNTER — Telehealth: Payer: Self-pay | Admitting: Family Medicine

## 2015-02-23 ENCOUNTER — Other Ambulatory Visit: Payer: Self-pay | Admitting: *Deleted

## 2015-02-23 DIAGNOSIS — E785 Hyperlipidemia, unspecified: Secondary | ICD-10-CM

## 2015-02-23 LAB — LIPID PANEL
CHOLESTEROL TOTAL: 143 mg/dL (ref 100–199)
Chol/HDL Ratio: 2.6 ratio units (ref 0.0–5.0)
HDL: 55 mg/dL (ref >39)
LDL CALC: 71 mg/dL (ref 0–99)
TRIGLYCERIDES: 84 mg/dL (ref 0–149)
VLDL CHOLESTEROL CAL: 17 mg/dL (ref 5–40)

## 2015-02-23 LAB — CBC WITH DIFFERENTIAL/PLATELET
BASOS ABS: 0 10*3/uL (ref 0.0–0.2)
BASOS: 0 %
EOS (ABSOLUTE): 0.1 10*3/uL (ref 0.0–0.4)
Eos: 1 %
Hematocrit: 45 % (ref 37.5–51.0)
Hemoglobin: 15.2 g/dL (ref 12.6–17.7)
IMMATURE GRANS (ABS): 0 10*3/uL (ref 0.0–0.1)
Immature Granulocytes: 0 %
LYMPHS: 31 %
Lymphocytes Absolute: 1.8 10*3/uL (ref 0.7–3.1)
MCH: 32.3 pg (ref 26.6–33.0)
MCHC: 33.8 g/dL (ref 31.5–35.7)
MCV: 96 fL (ref 79–97)
Monocytes Absolute: 0.5 10*3/uL (ref 0.1–0.9)
Monocytes: 8 %
Neutrophils Absolute: 3.5 10*3/uL (ref 1.4–7.0)
Neutrophils: 60 %
Platelets: 155 10*3/uL (ref 150–379)
RBC: 4.71 x10E6/uL (ref 4.14–5.80)
RDW: 13.6 % (ref 12.3–15.4)
WBC: 5.8 10*3/uL (ref 3.4–10.8)

## 2015-02-23 LAB — BMP8+EGFR
BUN/Creatinine Ratio: 18 (ref 10–22)
BUN: 16 mg/dL (ref 8–27)
CALCIUM: 9.4 mg/dL (ref 8.6–10.2)
CHLORIDE: 103 mmol/L (ref 96–106)
CO2: 24 mmol/L (ref 18–29)
Creatinine, Ser: 0.9 mg/dL (ref 0.76–1.27)
GFR calc non Af Amer: 85 mL/min/{1.73_m2} (ref >59)
GFR, EST AFRICAN AMERICAN: 98 mL/min/{1.73_m2} (ref >59)
Glucose: 104 mg/dL — ABNORMAL HIGH (ref 65–99)
POTASSIUM: 4.3 mmol/L (ref 3.5–5.2)
SODIUM: 144 mmol/L (ref 134–144)

## 2015-02-23 LAB — HEPATIC FUNCTION PANEL
ALT: 17 IU/L (ref 0–44)
AST: 17 IU/L (ref 0–40)
Albumin: 4.4 g/dL (ref 3.5–4.8)
Alkaline Phosphatase: 65 IU/L (ref 39–117)
BILIRUBIN, DIRECT: 0.13 mg/dL (ref 0.00–0.40)
Bilirubin Total: 0.5 mg/dL (ref 0.0–1.2)
TOTAL PROTEIN: 6.7 g/dL (ref 6.0–8.5)

## 2015-02-23 LAB — VITAMIN D 25 HYDROXY (VIT D DEFICIENCY, FRACTURES): Vit D, 25-Hydroxy: 60 ng/mL (ref 30.0–100.0)

## 2015-02-23 NOTE — Telephone Encounter (Signed)
Pt called

## 2015-02-24 ENCOUNTER — Other Ambulatory Visit: Payer: Medicare Other

## 2015-02-24 DIAGNOSIS — E785 Hyperlipidemia, unspecified: Secondary | ICD-10-CM | POA: Diagnosis not present

## 2015-02-24 NOTE — Progress Notes (Signed)
Lab only 

## 2015-02-25 LAB — NMR, LIPOPROFILE
Cholesterol: 143 mg/dL (ref 100–199)
HDL Cholesterol by NMR: 60 mg/dL (ref >39)
HDL Particle Number: 33.2 umol/L (ref >=30.5)
LDL PARTICLE NUMBER: 924 nmol/L (ref <1000)
LDL SIZE: 21.2 nm (ref >20.5)
LDL-C: 67 mg/dL (ref 0–99)
LP-IR SCORE: 29 (ref <=45)
Small LDL Particle Number: 276 nmol/L (ref <=527)
TRIGLYCERIDES BY NMR: 80 mg/dL (ref 0–149)

## 2015-03-02 ENCOUNTER — Other Ambulatory Visit: Payer: Self-pay | Admitting: Family Medicine

## 2015-03-02 ENCOUNTER — Telehealth: Payer: Self-pay | Admitting: Family Medicine

## 2015-03-02 NOTE — Telephone Encounter (Signed)
Faxed by [employer]

## 2015-03-02 NOTE — Telephone Encounter (Signed)
Lipid panel done 02/24/2015 - please print and fax to [facility]

## 2015-03-07 ENCOUNTER — Telehealth: Payer: Self-pay | Admitting: Pharmacist

## 2015-03-07 ENCOUNTER — Ambulatory Visit (INDEPENDENT_AMBULATORY_CARE_PROVIDER_SITE_OTHER): Payer: Medicare Other | Admitting: Family Medicine

## 2015-03-07 ENCOUNTER — Encounter: Payer: Self-pay | Admitting: Family Medicine

## 2015-03-07 VITALS — BP 139/77 | HR 60 | Temp 97.5°F | Ht 73.0 in | Wt 211.0 lb

## 2015-03-07 DIAGNOSIS — N400 Enlarged prostate without lower urinary tract symptoms: Secondary | ICD-10-CM

## 2015-03-07 DIAGNOSIS — E559 Vitamin D deficiency, unspecified: Secondary | ICD-10-CM

## 2015-03-07 DIAGNOSIS — Z72820 Sleep deprivation: Secondary | ICD-10-CM | POA: Diagnosis not present

## 2015-03-07 DIAGNOSIS — I2510 Atherosclerotic heart disease of native coronary artery without angina pectoris: Secondary | ICD-10-CM

## 2015-03-07 DIAGNOSIS — I2583 Coronary atherosclerosis due to lipid rich plaque: Secondary | ICD-10-CM

## 2015-03-07 DIAGNOSIS — E785 Hyperlipidemia, unspecified: Secondary | ICD-10-CM

## 2015-03-07 DIAGNOSIS — I10 Essential (primary) hypertension: Secondary | ICD-10-CM

## 2015-03-07 DIAGNOSIS — D696 Thrombocytopenia, unspecified: Secondary | ICD-10-CM

## 2015-03-07 DIAGNOSIS — IMO0001 Reserved for inherently not codable concepts without codable children: Secondary | ICD-10-CM

## 2015-03-07 DIAGNOSIS — Z7689 Persons encountering health services in other specified circumstances: Secondary | ICD-10-CM

## 2015-03-07 DIAGNOSIS — E538 Deficiency of other specified B group vitamins: Secondary | ICD-10-CM | POA: Diagnosis not present

## 2015-03-07 DIAGNOSIS — I25810 Atherosclerosis of coronary artery bypass graft(s) without angina pectoris: Secondary | ICD-10-CM

## 2015-03-07 DIAGNOSIS — Z8052 Family history of malignant neoplasm of bladder: Secondary | ICD-10-CM

## 2015-03-07 DIAGNOSIS — R030 Elevated blood-pressure reading, without diagnosis of hypertension: Secondary | ICD-10-CM | POA: Diagnosis not present

## 2015-03-07 LAB — POCT UA - MICROSCOPIC ONLY
Bacteria, U Microscopic: NEGATIVE
Casts, Ur, LPF, POC: NEGATIVE
Crystals, Ur, HPF, POC: NEGATIVE
Mucus, UA: NEGATIVE
RBC, URINE, MICROSCOPIC: NEGATIVE
WBC, UR, HPF, POC: NEGATIVE
Yeast, UA: NEGATIVE

## 2015-03-07 LAB — POCT URINALYSIS DIPSTICK
BILIRUBIN UA: NEGATIVE
GLUCOSE UA: NEGATIVE
KETONES UA: NEGATIVE
Leukocytes, UA: NEGATIVE
Nitrite, UA: NEGATIVE
Protein, UA: NEGATIVE
RBC UA: NEGATIVE
Urobilinogen, UA: NEGATIVE
pH, UA: 5

## 2015-03-07 NOTE — Patient Instructions (Addendum)
Medicare Annual Wellness Visit  [facility] and the medical providers at [facility] strive to bring you the best medical care.  In doing so we not only want to address your current medical conditions and concerns but also to detect new conditions early and prevent illness, disease and health-related problems.    Medicare offers a yearly Wellness Visit which allows our clinical staff to assess your need for preventative services including immunizations, lifestyle education, counseling to decrease risk of preventable diseases and screening for fall risk and other medical concerns.    This visit is provided free of charge (no copay) for all Medicare recipients. The clinical pharmacists at [facility] have begun to conduct these Wellness Visits which will also include a thorough review of all your medications.    As you primary medical provider recommend that you make an appointment for your Annual Wellness Visit if you have not done so already this year.  You may set up this appointment before you leave today or you may call back [phone] and schedule an appointment.  Please make sure when you call that you mention that you are scheduling your Annual Wellness Visit with the clinical pharmacist so that the appointment may be made for the proper length of time.     Continue current medications. Continue good therapeutic lifestyle changes which include good diet and exercise. Fall precautions discussed with patient. If an FOBT was given today- please return it to our front desk. If you are over 36 years old - you may need Prevnar 20 or the adult Pneumonia vaccine.  **Flu shots are available--- please call and schedule a FLU-CLINIC appointment**  After your visit with [place] today you will receive a survey in the mail or online from [employer] regarding your care with [place]. Please take a moment to fill this out. Your feedback is very  important to [place] as you can help [place] better understand your patient needs as well as improve your experience and satisfaction. WE CARE ABOUT YOU!!!   Pick up some Zantac 150 - equate brand OTC 150 mg  Alternate days taking Omeprazole and zantac for 1 month - then switch over to zantac 150 daily  The patient should continue with his Repatha and with aggressive therapeutic lifestyle changes which include diet and exercise We will call him with the results of the B12 level as soon as it becomes available

## 2015-03-07 NOTE — Telephone Encounter (Signed)
Discussed with patient that [name] [name] notified us that Repatha was approved through insurance 03/03/2015 thru 03/02/2016

## 2015-03-07 NOTE — Progress Notes (Signed)
Subjective:    Patient ID: [name] [name], male    DOB: 10-Feb-1941, 75 y.o.   MRN: [id number]  HPI Pt here for follow up and management of chronic medical problems which includes hyperlipidemia.He is taking medications regularly. Patient is doing well overall. HIS recent lab work was reviewed with him and everything was excellent. He is taking injections of PCS K-9 inhibitor therapy for his cholesterol. He is tolerating this well. He denies any chest pain shortness of breath trouble swallowing heartburn and indigestion nausea vomiting diarrhea or blood in the stool or urine. He does have some occasional abdominal discomfort. He continues to have occasional burning in his feet. He is going to try to taper off of the Prilosec over the next couple months and get on Zantac by itself twice daily he was explained how to do this. He is questioning his need for B12 and we will check a B12 level today. He also brought up that he had sleep studies done and was found to have moderate obstructive sleep apnea but no one discuss these results with him. We will schedule a visit with Dr. Baird [name] to review these results and initiate treatment if necessary. He does complain of a little bit of abdominal discomfort and we will arrange to have a ultrasound of his abdomen.      Patient Active Problem List   Diagnosis Date Noted  . Obstructive sleep apnea 03/17/2014  . Erectile dysfunction due to arterial insufficiency 01/12/2014  . Thrombocytopenia ([place]) 12/30/2013  . B12 deficiency 10/01/2012  . Allergic rhinitis 10/01/2012  . BPH (benign prostatic hyperplasia) 10/01/2012  . PVC's (premature ventricular contractions) 09/01/2009  . COLONIC POLYPS 04/14/2009  . Hyperlipidemia 04/14/2009  . Essential hypertension 04/14/2009  . Coronary atherosclerosis 04/14/2009  . HIATAL HERNIA 04/14/2009  . HERNIATED DISC 04/14/2009   Outpatient Encounter Prescriptions as of 03/07/2015  Medication Sig  . aspirin 81  MG tablet Take 1 tablet twice a day  . Cholecalciferol (VITAMIN D3) 5000 UNITS CAPS Take 1 capsule by mouth daily.  . Coenzyme Q10 (COQ10) 100 MG CAPS Take 1 capsule by mouth 2 (two) times daily.    . cyclobenzaprine (FLEXERIL) 5 MG tablet Take 1 tablet (5 mg total) by mouth 3 (three) times daily as needed for muscle spasms.  . Evolocumab (REPATHA) 140 MG/ML SOSY Inject 140 mg into the skin as directed.  . fluticasone (FLONASE) 50 MCG/ACT nasal spray USE 2 SPRAYS IN EACH NOSTRIL ONCE DAILY.  . meloxicam (MOBIC) 15 MG tablet TAKE (1) TABLET DAILY AS DIRECTED.  Marland Kitchen metoprolol tartrate (LOPRESSOR) 25 MG tablet Take 1 tablet (25 mg total) by mouth as directed. Take one tablet every AM and 2 tablets every PM  . Misc Natural Products (OSTEO BI-FLEX ADV JOINT SHIELD PO) Take 1 capsule by mouth 2 (two) times daily.   . Multiple Vitamin (MULTI VITAMIN MENS PO) Take 1 tablet by mouth 2 (two) times daily.  . Omega-3 Fatty Acids (FISH OIL) 1000 MG CAPS Take 2 capsules by mouth daily. 1000 mg?   . omeprazole (PRILOSEC) 20 MG capsule Take 20 mg by mouth daily.  . sildenafil (VIAGRA) 100 MG tablet Take 0.5-1 tablets (50-100 mg total) by mouth daily as needed for erectile dysfunction.  . [DISCONTINUED] REPATHA SURECLICK [id number] MG/ML SOAJ    No facility-administered encounter medications on file as of 03/07/2015.      Review of Systems  Constitutional: Negative.   HENT: Negative.   Eyes: Negative.   Respiratory:  Negative.   Cardiovascular: Negative.   Gastrointestinal: Positive for abdominal pain (occasionally).  Endocrine: Negative.   Genitourinary: Negative.   Musculoskeletal: Negative.   Skin: Negative.   Allergic/Immunologic: Negative.   Neurological: Negative.   Hematological: Negative.   Psychiatric/Behavioral: Negative.        Objective:   Physical Exam  Constitutional: He is oriented to person, place, and time. He appears well-developed and well-nourished. No distress.  HENT:  Head:  Normocephalic and atraumatic.  Right Ear: External ear normal.  Left Ear: External ear normal.  Nose: Nose normal.  Mouth/Throat: Oropharynx is clear and moist. No oropharyngeal exudate.  Eyes: Conjunctivae and EOM are normal. Pupils are equal, round, and reactive to light. Right eye exhibits no discharge. Left eye exhibits no discharge. No scleral icterus.  Neck: Normal range of motion. Neck supple. No thyromegaly present.  No bruits or thyromegaly or anterior cervical adenopathy  Cardiovascular: Normal rate, regular rhythm, normal heart sounds and intact distal pulses.   No murmur heard. Axillary regions were negative for adenopathy The heart was regular at 60/m  Pulmonary/Chest: Effort normal and breath sounds normal. No respiratory distress. He has no wheezes. He has no rales. He exhibits no tenderness.  Lungs were clear anteriorly and posteriorly  Abdominal: Soft. Bowel sounds are normal. He exhibits no mass. There is no tenderness. There is no rebound and no guarding.  There was no epigastric tenderness or abdominal tenderness masses or organ enlargement.  Musculoskeletal: Normal range of motion. He exhibits no edema or tenderness.  Lymphadenopathy:    He has no cervical adenopathy.  Neurological: He is alert and oriented to person, place, and time. He has normal reflexes. No cranial nerve deficit.  Skin: Skin is warm and dry. No rash noted.  Psychiatric: He has a normal mood and affect. His behavior is normal. Judgment and thought content normal.  Nursing note and vitals reviewed.   BP 139/77 mmHg  Pulse 60  Temp(Src) 97.5 F (36.4 C) (Oral)  Ht 6\' 1"  (1.854 m)  Wt 211 lb (95.709 kg)  BMI 27.84 kg/m2       Assessment & Plan:  1. Thrombocytopenia ([place]) -This was actually within normal limits on the CBC reviewed with the patient today  2. Vitamin D deficiency -The vitamin D level was good and the patient will continue his current vitamin D replacement  3. BPH (benign  prostatic hyperplasia) -He is having no prostate symptoms.  4. Hyperlipemia -Cholesterol numbers remain excellent on PACS K-9 therapy. He will continue with current treatment as well as aggressive therapeutic lifestyle changes which include diet and exercise  5. Elevated blood pressure -The repeated blood pressure today was good and patient will continue with his metoprolol as doing.  6. Coronary artery disease involving coronary bypass graft of native heart without angina pectoris -He is having no anginal symptoms we will continue to follow-up with his cardiologist  7. Family history of bladder cancer -No signs of any hematuria but will check a urine to be on the safe side. - POCT UA - Microscopic Only - POCT urinalysis dipstick  8. Hyperlipidemia LDL goal <70 -Continue PCS K-9 inhibitors therapy and all cholesterol numbers with advanced lipid testing are good and at goal  9. Essential hypertension -The blood pressure is good and at goal he will continue with his current beta blocker  10. Coronary atherosclerosis due to lipid rich plaque -Continue with aggressive therapeutic lifestyle changes and PCS K-9 inhibitor therapy  11. Sleep concern -The patient  will follow-up with the pulmonologist reading regarding his moderate obstructive sleep apnea test with recommendations for any treatment if any. - Ambulatory referral to Pulmonology - Vitamin B12  Patient Instructions                       Medicare Annual Wellness Visit  [facility] and the medical providers at [facility] strive to bring you the best medical care.  In doing so we not only want to address your current medical conditions and concerns but also to detect new conditions early and prevent illness, disease and health-related problems.    Medicare offers a yearly Wellness Visit which allows our clinical staff to assess your need for preventative services including immunizations, lifestyle  education, counseling to decrease risk of preventable diseases and screening for fall risk and other medical concerns.    This visit is provided free of charge (no copay) for all Medicare recipients. The clinical pharmacists at [facility] have begun to conduct these Wellness Visits which will also include a thorough review of all your medications.    As you primary medical provider recommend that you make an appointment for your Annual Wellness Visit if you have not done so already this year.  You may set up this appointment before you leave today or you may call back [phone] and schedule an appointment.  Please make sure when you call that you mention that you are scheduling your Annual Wellness Visit with the clinical pharmacist so that the appointment may be made for the proper length of time.     Continue current medications. Continue good therapeutic lifestyle changes which include good diet and exercise. Fall precautions discussed with patient. If an FOBT was given today- please return it to our front desk. If you are over 24 years old - you may need Prevnar 69 or the adult Pneumonia vaccine.  **Flu shots are available--- please call and schedule a FLU-CLINIC appointment**  After your visit with [place] today you will receive a survey in the mail or online from [employer] regarding your care with [place]. Please take a moment to fill this out. Your feedback is very important to [place] as you can help [place] better understand your patient needs as well as improve your experience and satisfaction. WE CARE ABOUT YOU!!!   Pick up some Zantac 150 - equate brand OTC 150 mg  Alternate days taking Omeprazole and zantac for 1 month - then switch over to zantac 150 daily  The patient should continue with his Repatha and with aggressive therapeutic lifestyle changes which include diet and exercise We will call him with the results of the B12 level as soon as it becomes available   We will  also arrange for the patient to have an ultrasound of his abdomen because of some abdominal discomfort  Arrie Senate MD

## 2015-03-07 NOTE — Telephone Encounter (Signed)
Tried to call - patient not available

## 2015-03-08 LAB — VITAMIN B12: Vitamin B-12: 1596 pg/mL — ABNORMAL HIGH (ref 211–946)

## 2015-03-14 ENCOUNTER — Telehealth: Payer: Self-pay | Admitting: Family Medicine

## 2015-03-14 DIAGNOSIS — R109 Unspecified abdominal pain: Secondary | ICD-10-CM

## 2015-03-14 DIAGNOSIS — R140 Abdominal distension (gaseous): Secondary | ICD-10-CM

## 2015-03-14 NOTE — Telephone Encounter (Signed)
At the last visit when computers were down - it was mentioned that he would get a "scan" of his belly because of his BLOATING. This was not put in  - i will order -[place] was noted in last OV note - per [facility]

## 2015-03-15 ENCOUNTER — Ambulatory Visit (HOSPITAL_COMMUNITY)
Admission: RE | Admit: 2015-03-15 | Discharge: 2015-03-15 | Disposition: A | Discharge status: Home / Self Care | Payer: Medicare Other | Source: Ambulatory Visit | Attending: Family Medicine | Admitting: Family Medicine

## 2015-03-15 DIAGNOSIS — I714 Abdominal aortic aneurysm, without rupture: Secondary | ICD-10-CM | POA: Insufficient documentation

## 2015-03-15 DIAGNOSIS — R109 Unspecified abdominal pain: Secondary | ICD-10-CM

## 2015-03-15 DIAGNOSIS — R140 Abdominal distension (gaseous): Secondary | ICD-10-CM | POA: Diagnosis not present

## 2015-04-04 ENCOUNTER — Telehealth: Payer: Self-pay

## 2015-04-04 NOTE — Telephone Encounter (Signed)
Pt has a question about cholesterol medication combinations

## 2015-04-05 NOTE — Telephone Encounter (Signed)
Patient wanted to know if should continue CoEnzyme Q10 since he is no longer taking statin.  I advised him to stop CoEnzyme Q10.  Also OK to stop Fish Oil also.

## 2015-04-05 NOTE — Telephone Encounter (Signed)
Tried to call - patient was not at home. Left message with his wife to have him call back.

## 2015-04-06 DIAGNOSIS — H25013 Cortical age-related cataract, bilateral: Secondary | ICD-10-CM | POA: Diagnosis not present

## 2015-04-06 DIAGNOSIS — H2513 Age-related nuclear cataract, bilateral: Secondary | ICD-10-CM | POA: Diagnosis not present

## 2015-04-12 ENCOUNTER — Telehealth: Payer: Self-pay | Admitting: Family Medicine

## 2015-05-16 ENCOUNTER — Telehealth: Payer: Self-pay | Admitting: Pharmacist Clinician (PhC)/ Clinical Pharmacy Specialist

## 2015-05-16 NOTE — Telephone Encounter (Signed)
Called patient and got his voicemail and left a message for him to call me back.  Message to call him did not include his question.

## 2015-05-26 DIAGNOSIS — Z8371 Family history of colonic polyps: Secondary | ICD-10-CM | POA: Diagnosis not present

## 2015-05-26 DIAGNOSIS — K210 Gastro-esophageal reflux disease with esophagitis: Secondary | ICD-10-CM | POA: Diagnosis not present

## 2015-05-26 DIAGNOSIS — R1013 Epigastric pain: Secondary | ICD-10-CM | POA: Diagnosis not present

## 2015-05-26 DIAGNOSIS — R1314 Dysphagia, pharyngoesophageal phase: Secondary | ICD-10-CM | POA: Diagnosis not present

## 2015-06-08 ENCOUNTER — Ambulatory Visit (INDEPENDENT_AMBULATORY_CARE_PROVIDER_SITE_OTHER): Payer: Medicare Other | Admitting: Internal Medicine

## 2015-06-08 ENCOUNTER — Encounter: Payer: Self-pay | Admitting: Internal Medicine

## 2015-06-08 VITALS — BP 120/64 | HR 53 | Ht 73.0 in | Wt 212.4 lb

## 2015-06-08 DIAGNOSIS — G4733 Obstructive sleep apnea (adult) (pediatric): Secondary | ICD-10-CM | POA: Diagnosis not present

## 2015-06-08 DIAGNOSIS — I25810 Atherosclerosis of coronary artery bypass graft(s) without angina pectoris: Secondary | ICD-10-CM

## 2015-06-08 NOTE — Progress Notes (Signed)
03/17/14- 62 yoM never smoker referred courtesy of Dr Rayna Sexton. No sleep study Medical problems include CAD, HBP, Hiatal hernia,  His wife has told him off-and-on that he snores loudly. Recently they put a humidifier in the bedroom and this seems to help. Bedtime 11:30 to 12:30 PM, sleep latency 15 minutes, waking once or twice before up at 8 AM. Weight stable. Father snored loudly. Brother has sleep apnea with CPAP. History of CABG 9 years ago without MI. No lung disease. Remote tonsillectomy  06/08/2015-74 year old male never smoker followed for OSA, complicated by CAD/ CABG, HBP, hiatal hernia Unattended Home Sleep Test 05/09/14- AHI 24.9/ hr, desat to 85%, body weight 208 pounds FOLLOWS FOR: had sleep study 05-2014 and never had follow up afterwards. Review sleep study with patient. He admits restless sleep, daytime tiredness. We reviewed his sleep study in discussed medical significance and treatment options. Starting CPAP with auto titration.  ROS-see HPI Constitutional:   No-   weight loss, night sweats, fevers, chills, +fatigue, lassitude. HEENT:   No-  headaches, difficulty swallowing, tooth/dental problems, sore throat,       No-  sneezing, itching, ear ache, nasal congestion, post nasal drip,  CV:  No-   chest pain, orthopnea, PND, swelling in lower extremities, anasarca,                                                       dizziness, palpitations Resp: No-   shortness of breath with exertion or at rest.              No-   productive cough,  No non-productive cough,  No- coughing up of blood.              No-   change in color of mucus.  No- wheezing.   Skin: No-   rash or lesions. GI:  No-   heartburn, indigestion, abdominal pain, nausea, vomiting, diarrhea,                 change in bowel habits, loss of appetite GU: No-   dysuria, change in color of urine, no urgency or frequency.  No- flank pain. MS:  + joint pain or swelling.  No- decreased range of motion.  No-  back pain. Neuro-     nothing unusual Psych:  No- change in mood or affect. No depression or anxiety.  No memory loss.  OBJ- Physical Exam General- Alert, Oriented, Affect-appropriate, Distress- none acute, tall Skin- rash-none, lesions- none, excoriation- none Lymphadenopathy- none Head- atraumatic            Eyes- Gross vision intact, PERRLA, conjunctivae and secretions clear            Ears- Hearing, canals-normal            Nose- Clear, no-Septal dev, mucus, polyps, erosion, perforation             Throat- Mallampati II-III , mucosa clear , drainage- none, tonsils- atrophic Neck- flexible , trachea midline, no stridor , thyroid nl, carotid no bruit Chest - symmetrical excursion , unlabored           Heart/CV- RRR , no murmur , no gallop  , no rub, nl s1 s2                           -  JVD- none , edema- none, stasis changes- none, varices- none           Lung- clear to P&A, wheeze- none, cough- none , dullness-none, rub- none           Chest wall-  Abd- tender-no, distended-no, bowel sounds-present, HSM- no Br/ Gen/ Rectal- Not done, not indicated Extrem- cyanosis- none, clubbing, none, atrophy- none, strength- nl Neuro- grossly intact to observation

## 2015-06-08 NOTE — Patient Instructions (Addendum)
Order- New DME, new CPAP auto 5-20, mask of choice, humidifier, supplies, AirView   Dx OSA  Please call as needed 

## 2015-06-08 NOTE — Assessment & Plan Note (Signed)
Moderately severe obstructive sleep apnea. Appropriate education and discussion done. Emphasized importance for him to contact us if he has questions. Plan-CPAP auto 5-20

## 2015-06-22 ENCOUNTER — Telehealth: Payer: Self-pay | Admitting: Internal Medicine

## 2015-06-22 NOTE — Telephone Encounter (Signed)
Spoke with Melissa at [facility]. States that the pt's sleep study from March 2016 can't be used due to pt having traditional Medicare. Pt will have to have another sleep study. CY's note will also have to amended to say that his sleep study was over 1 year ago and per his insurance he will have to repeat another test.  CY - please advise. Thanks.

## 2015-06-22 NOTE — Telephone Encounter (Signed)
Message sent to [facility] to confirm why this is needed-? new guidelines for Insurance. Will hold in my basket until answered. Thanks.

## 2015-06-30 DIAGNOSIS — R1013 Epigastric pain: Secondary | ICD-10-CM | POA: Diagnosis not present

## 2015-06-30 DIAGNOSIS — Z8371 Family history of colonic polyps: Secondary | ICD-10-CM | POA: Diagnosis not present

## 2015-06-30 DIAGNOSIS — R1310 Dysphagia, unspecified: Secondary | ICD-10-CM | POA: Diagnosis not present

## 2015-06-30 DIAGNOSIS — K449 Diaphragmatic hernia without obstruction or gangrene: Secondary | ICD-10-CM | POA: Diagnosis not present

## 2015-06-30 DIAGNOSIS — K648 Other hemorrhoids: Secondary | ICD-10-CM | POA: Diagnosis not present

## 2015-06-30 DIAGNOSIS — Z1211 Encounter for screening for malignant neoplasm of colon: Secondary | ICD-10-CM | POA: Diagnosis not present

## 2015-07-04 ENCOUNTER — Encounter: Payer: Self-pay | Admitting: Family

## 2015-07-04 ENCOUNTER — Ambulatory Visit (INDEPENDENT_AMBULATORY_CARE_PROVIDER_SITE_OTHER): Payer: Medicare Other | Admitting: Family

## 2015-07-04 VITALS — BP 120/75 | HR 64 | Temp 97.5°F | Ht 73.0 in | Wt 210.0 lb

## 2015-07-04 DIAGNOSIS — W57XXXA Bitten or stung by nonvenomous insect and other nonvenomous arthropods, initial encounter: Secondary | ICD-10-CM | POA: Diagnosis not present

## 2015-07-04 DIAGNOSIS — I25810 Atherosclerosis of coronary artery bypass graft(s) without angina pectoris: Secondary | ICD-10-CM

## 2015-07-04 DIAGNOSIS — S90922A Unspecified superficial injury of left foot, initial encounter: Secondary | ICD-10-CM

## 2015-07-04 MED ORDER — DOXYCYCLINE HYCLATE 100 MG PO TABS: 200.0000 mg | ORAL_TABLET | Freq: Two times a day (BID) | ORAL | Status: DC

## 2015-07-04 NOTE — Patient Instructions (Signed)
Tick Bite Information Ticks are insects that attach themselves to the skin and draw blood for food. There are various types of ticks. Common types include wood ticks and deer ticks. Most ticks live in shrubs and grassy areas. Ticks can climb onto your body when you make contact with leaves or grass where the tick is waiting. The most common places on the body for ticks to attach themselves are the scalp, neck, armpits, waist, and groin. Most tick bites are harmless, but sometimes ticks carry germs that cause diseases. These germs can be spread to a person during the tick's feeding process. The chance of a disease spreading through a tick bite depends on:   The type of tick.  Time of year.   How long the tick is attached.   Geographic location.  HOW CAN YOU PREVENT TICK BITES? Take these steps to help prevent tick bites when you are outdoors:  Wear protective clothing. Long sleeves and long pants are best.   Wear white clothes so you can see ticks more easily.  Tuck your pant legs into your socks.   If walking on a trail, stay in the middle of the trail to avoid brushing against bushes.  Avoid walking through areas with long grass.  Put insect repellent on all exposed skin and along boot tops, pant legs, and sleeve cuffs.   Check clothing, hair, and skin repeatedly and before going inside.   Brush off any ticks that are not attached.  Take a shower or bath as soon as possible after being outdoors.  WHAT IS THE PROPER WAY TO REMOVE A TICK? Ticks should be removed as soon as possible to help prevent diseases caused by tick bites. 1. If latex gloves are available, put them on before trying to remove a tick.  2. Using fine-point tweezers, grasp the tick as close to the skin as possible. You may also use curved forceps or a tick removal tool. Grasp the tick as close to its head as possible. Avoid grasping the tick on its body. 3. Pull gently with steady upward pressure until  the tick lets go. Do not twist the tick or jerk it suddenly. This may break off the tick's head or mouth parts. 4. Do not squeeze or crush the tick's body. This could force disease-carrying fluids from the tick into your body.  5. After the tick is removed, wash the bite area and your hands with soap and water or other disinfectant such as alcohol. 6. Apply a small amount of antiseptic cream or ointment to the bite site.  7. Wash and disinfect any instruments that were used.  Do not try to remove a tick by applying a hot match, petroleum jelly, or fingernail polish to the tick. These methods do not work and may increase the chances of disease being spread from the tick bite.  WHEN SHOULD YOU SEEK MEDICAL CARE? Contact your health care provider if you are unable to remove a tick from your skin or if a part of the tick breaks off and is stuck in the skin.  After a tick bite, you need to be aware of signs and symptoms that could be related to diseases spread by ticks. Contact your health care provider if you develop any of the following in the days or weeks after the tick bite:  Unexplained fever.  Rash. A circular rash that appears days or weeks after the tick bite may indicate the possibility of Lyme disease. The rash may resemble   a target with a bull's-eye and may occur at a different part of your body than the tick bite.  Redness and swelling in the area of the tick bite.   Tender, swollen lymph glands.   Diarrhea.   Weight loss.   Cough.   Fatigue.   Muscle, joint, or bone pain.   Abdominal pain.   Headache.   Lethargy or a change in your level of consciousness.  Difficulty walking or moving your legs.   Numbness in the legs.   Paralysis.  Shortness of breath.   Confusion.   Repeated vomiting.    This information is not intended to replace advice given to you by your health care provider. Make sure you discuss any questions you have with your health  care provider.   Document Released: 02/16/2000 Document Revised: 03/11/2014 Document Reviewed: 07/29/2012 Elsevier Interactive Patient Education 2016 Elsevier Inc.  

## 2015-07-04 NOTE — Progress Notes (Signed)
   Subjective:    Patient ID: [name] [name], male    DOB: 10-24-1940, 75 y.o.   MRN: [id number]  HPI Pt presents to the office today with a tick bite. Pt states he removed the tick on his left foot Monday evening. Pt states it was "itching", but denies any fever, joint pain, or warmth.    Review of Systems  All other systems reviewed and are negative.      Objective:   Physical Exam  Constitutional: He is oriented to person, place, and time. He appears well-developed and well-nourished. No distress.  HENT:  Head: Normocephalic.  Right Ear: External ear normal.  Left Ear: External ear normal.  Mouth/Throat: Oropharynx is clear and moist.  Eyes: Pupils are equal, round, and reactive to light. Right eye exhibits no discharge. Left eye exhibits no discharge.  Neck: Normal range of motion. Neck supple. No thyromegaly present.  Cardiovascular: Normal rate, regular rhythm, normal heart sounds and intact distal pulses.   No murmur heard. Pulmonary/Chest: Effort normal and breath sounds normal. No respiratory distress. He has no wheezes.  Abdominal: Soft. Bowel sounds are normal. He exhibits no distension. There is no tenderness.  Musculoskeletal: Normal range of motion. He exhibits no edema or tenderness.  Neurological: He is alert and oriented to person, place, and time. He has normal reflexes. No cranial nerve deficit.  Skin: Skin is warm and dry. No rash noted. There is erythema.  Erythemas circular rash on left foot, 1cmX1cm  Psychiatric: He has a normal mood and affect. His behavior is normal. Judgment and thought content normal.  Vitals reviewed.  BP 120/75 mmHg  Pulse 64  Temp(Src) 97.5 F (36.4 C) (Oral)  Ht 6\' 1"  (1.854 m)  Wt 210 lb (95.255 kg)  BMI 27.71 kg/m2        Assessment & Plan:  1. Tick bite -Pt to report any new fever, joint pain, or rash -Wear protective clothing while outside- Long sleeves and long pants -Put insect repellent on all exposed skin and  along clothing -Take a shower as soon as possible after being outside  - doxycycline (VIBRA-TABS) 100 MG tablet; Take 2 tablets (200 mg total) by mouth 2 (two) times daily.  Dispense: 2 tablet; Refill: 0  Evelina Dun, FNP

## 2015-07-10 ENCOUNTER — Telehealth: Payer: Self-pay | Admitting: Family Medicine

## 2015-07-10 ENCOUNTER — Other Ambulatory Visit: Payer: Self-pay | Admitting: *Deleted

## 2015-07-10 DIAGNOSIS — D696 Thrombocytopenia, unspecified: Secondary | ICD-10-CM

## 2015-07-10 DIAGNOSIS — E785 Hyperlipidemia, unspecified: Secondary | ICD-10-CM

## 2015-07-10 DIAGNOSIS — E538 Deficiency of other specified B group vitamins: Secondary | ICD-10-CM

## 2015-07-10 DIAGNOSIS — I10 Essential (primary) hypertension: Secondary | ICD-10-CM

## 2015-07-10 DIAGNOSIS — N400 Enlarged prostate without lower urinary tract symptoms: Secondary | ICD-10-CM

## 2015-07-11 ENCOUNTER — Encounter: Payer: Self-pay | Admitting: Family Medicine

## 2015-07-11 ENCOUNTER — Other Ambulatory Visit: Payer: Medicare Other

## 2015-07-11 DIAGNOSIS — D696 Thrombocytopenia, unspecified: Secondary | ICD-10-CM

## 2015-07-11 DIAGNOSIS — N400 Enlarged prostate without lower urinary tract symptoms: Secondary | ICD-10-CM

## 2015-07-11 DIAGNOSIS — I10 Essential (primary) hypertension: Secondary | ICD-10-CM | POA: Diagnosis not present

## 2015-07-11 DIAGNOSIS — E538 Deficiency of other specified B group vitamins: Secondary | ICD-10-CM

## 2015-07-11 DIAGNOSIS — E785 Hyperlipidemia, unspecified: Secondary | ICD-10-CM | POA: Diagnosis not present

## 2015-07-12 LAB — CBC WITH DIFFERENTIAL/PLATELET
BASOS: 0 %
Basophils Absolute: 0 10*3/uL (ref 0.0–0.2)
EOS (ABSOLUTE): 0.1 10*3/uL (ref 0.0–0.4)
EOS: 1 %
HEMATOCRIT: 44.1 % (ref 37.5–51.0)
HEMOGLOBIN: 14.7 g/dL (ref 12.6–17.7)
IMMATURE GRANULOCYTES: 0 %
Immature Grans (Abs): 0 10*3/uL (ref 0.0–0.1)
LYMPHS ABS: 1.4 10*3/uL (ref 0.7–3.1)
LYMPHS: 28 %
MCH: 31.3 pg (ref 26.6–33.0)
MCHC: 33.3 g/dL (ref 31.5–35.7)
MCV: 94 fL (ref 79–97)
MONOCYTES: 8 %
MONOS ABS: 0.4 10*3/uL (ref 0.1–0.9)
NEUTROS PCT: 63 %
Neutrophils Absolute: 3.2 10*3/uL (ref 1.4–7.0)
Platelets: 151 10*3/uL (ref 150–379)
RBC: 4.7 x10E6/uL (ref 4.14–5.80)
RDW: 13.8 % (ref 12.3–15.4)
WBC: 5.1 10*3/uL (ref 3.4–10.8)

## 2015-07-12 LAB — BMP8+EGFR
BUN/Creatinine Ratio: 22 (ref 10–24)
BUN: 18 mg/dL (ref 8–27)
CALCIUM: 9.4 mg/dL (ref 8.6–10.2)
CO2: 24 mmol/L (ref 18–29)
CREATININE: 0.81 mg/dL (ref 0.76–1.27)
Chloride: 104 mmol/L (ref 96–106)
GFR, EST AFRICAN AMERICAN: 103 mL/min/{1.73_m2} (ref >59)
GFR, EST NON AFRICAN AMERICAN: 89 mL/min/{1.73_m2} (ref >59)
Glucose: 100 mg/dL — ABNORMAL HIGH (ref 65–99)
Potassium: 4.5 mmol/L (ref 3.5–5.2)
Sodium: 142 mmol/L (ref 134–144)

## 2015-07-12 LAB — NMR, LIPOPROFILE
Cholesterol: 135 mg/dL (ref 100–199)
HDL CHOLESTEROL BY NMR: 56 mg/dL (ref >39)
HDL PARTICLE NUMBER: 32.7 umol/L (ref >=30.5)
LDL Particle Number: 1025 nmol/L — ABNORMAL HIGH (ref <1000)
LDL Size: 20.8 nm (ref >20.5)
LDL-C: 66 mg/dL (ref 0–99)
SMALL LDL PARTICLE NUMBER: 454 nmol/L (ref <=527)
Triglycerides by NMR: 64 mg/dL (ref 0–149)

## 2015-07-12 LAB — HEPATIC FUNCTION PANEL
ALBUMIN: 4.4 g/dL (ref 3.5–4.8)
ALK PHOS: 56 IU/L (ref 39–117)
ALT: 18 IU/L (ref 0–44)
AST: 23 IU/L (ref 0–40)
BILIRUBIN TOTAL: 0.6 mg/dL (ref 0.0–1.2)
Bilirubin, Direct: 0.16 mg/dL (ref 0.00–0.40)
Total Protein: 6.5 g/dL (ref 6.0–8.5)

## 2015-07-12 LAB — PSA, TOTAL AND FREE
PROSTATE SPECIFIC AG, SERUM: 1.8 ng/mL (ref 0.0–4.0)
PSA FREE: 0.47 ng/mL
PSA, Free Pct: 26.1 %

## 2015-07-20 ENCOUNTER — Ambulatory Visit (INDEPENDENT_AMBULATORY_CARE_PROVIDER_SITE_OTHER): Payer: Medicare Other | Admitting: Family Medicine

## 2015-07-20 ENCOUNTER — Encounter: Payer: Self-pay | Admitting: Family Medicine

## 2015-07-20 VITALS — BP 128/67 | HR 65 | Temp 98.1°F | Ht 73.0 in | Wt 208.0 lb

## 2015-07-20 DIAGNOSIS — N400 Enlarged prostate without lower urinary tract symptoms: Secondary | ICD-10-CM | POA: Diagnosis not present

## 2015-07-20 DIAGNOSIS — Z8052 Family history of malignant neoplasm of bladder: Secondary | ICD-10-CM

## 2015-07-20 DIAGNOSIS — E559 Vitamin D deficiency, unspecified: Secondary | ICD-10-CM | POA: Diagnosis not present

## 2015-07-20 DIAGNOSIS — I714 Abdominal aortic aneurysm, without rupture, unspecified: Secondary | ICD-10-CM | POA: Insufficient documentation

## 2015-07-20 DIAGNOSIS — I25810 Atherosclerosis of coronary artery bypass graft(s) without angina pectoris: Secondary | ICD-10-CM | POA: Diagnosis not present

## 2015-07-20 DIAGNOSIS — D696 Thrombocytopenia, unspecified: Secondary | ICD-10-CM | POA: Diagnosis not present

## 2015-07-20 DIAGNOSIS — I10 Essential (primary) hypertension: Secondary | ICD-10-CM | POA: Diagnosis not present

## 2015-07-20 DIAGNOSIS — E538 Deficiency of other specified B group vitamins: Secondary | ICD-10-CM

## 2015-07-20 DIAGNOSIS — R109 Unspecified abdominal pain: Secondary | ICD-10-CM

## 2015-07-20 DIAGNOSIS — E785 Hyperlipidemia, unspecified: Secondary | ICD-10-CM

## 2015-07-20 LAB — URINALYSIS, COMPLETE
BILIRUBIN UA: NEGATIVE
GLUCOSE, UA: NEGATIVE
LEUKOCYTES UA: NEGATIVE
Nitrite, UA: NEGATIVE
PH UA: 5.5 (ref 5.0–7.5)
PROTEIN UA: NEGATIVE
RBC UA: NEGATIVE
SPEC GRAV UA: 1.025 (ref 1.005–1.030)
Urobilinogen, Ur: 0.2 mg/dL (ref 0.2–1.0)

## 2015-07-20 LAB — MICROSCOPIC EXAMINATION
Bacteria, UA: NONE SEEN
Epithelial Cells (non renal): NONE SEEN /hpf (ref 0–10)
RBC, UA: NONE SEEN /hpf (ref 0–?)
WBC, UA: NONE SEEN /hpf (ref 0–?)

## 2015-07-20 NOTE — Progress Notes (Signed)
Subjective:    Patient ID: [name] [name], male    DOB: 1940-06-22, 75 y.o.   MRN: [id number]  HPI Pt here for follow up and management of chronic medical problems which includes hyperlipidemia. He is taking medications regularly.The patient today complains of some right sided abdominal pain at times. He is also concerned about a skin lesion on his right lower leg. The patient denies any chest pain or chest tightness. He is been exercising regularly and has lost 23 pounds of weight since the last visit. He denies any heartburn indigestion nausea vomiting diarrhea or blood in the stool.. Recently had an endoscopy and colonoscopy and was told by the gastroenterologist that all these tests were negative as far as any pathological findings. He's passing his water without problems but does dribble after voiding occasionally. The urologist was made aware of this. We also has some discussion regarding sleep apnea evaluation over the past 18 months and this seems to be a lack of communication between the provider's office and the patient would getting the CPAP ordered for the patient. We will try to help him by talking to their office and see what we can work out for him.     Patient Active Problem List   Diagnosis Date Noted  . Obstructive sleep apnea 03/17/2014  . Erectile dysfunction due to arterial insufficiency 01/12/2014  . Thrombocytopenia ([place]) 12/30/2013  . B12 deficiency 10/01/2012  . Allergic rhinitis 10/01/2012  . BPH (benign prostatic hyperplasia) 10/01/2012  . PVC's (premature ventricular contractions) 09/01/2009  . COLONIC POLYPS 04/14/2009  . Hyperlipidemia LDL goal <70 04/14/2009  . Essential hypertension 04/14/2009  . Coronary atherosclerosis 04/14/2009  . HIATAL HERNIA 04/14/2009  . HERNIATED DISC 04/14/2009   Outpatient Encounter Prescriptions as of 07/20/2015  Medication Sig  . aspirin 81 MG tablet Take 1 tablet twice a day  . Cholecalciferol (VITAMIN D3) 5000 UNITS  CAPS Take 1 capsule by mouth daily.  . cyclobenzaprine (FLEXERIL) 5 MG tablet Take 1 tablet (5 mg total) by mouth 3 (three) times daily as needed for muscle spasms.  . Evolocumab (REPATHA) 140 MG/ML SOSY Inject 140 mg into the skin as directed.  . fluticasone (FLONASE) 50 MCG/ACT nasal spray USE 2 SPRAYS IN EACH NOSTRIL ONCE DAILY.  . meloxicam (MOBIC) 15 MG tablet TAKE (1) TABLET DAILY AS DIRECTED.  Marland Kitchen metoprolol tartrate (LOPRESSOR) 25 MG tablet Take 1 tablet (25 mg total) by mouth as directed. Take one tablet every AM and 2 tablets every PM  . Misc Natural Products (OSTEO BI-FLEX ADV JOINT SHIELD PO) Take 1 capsule by mouth 2 (two) times daily.   . Multiple Vitamin (MULTI VITAMIN MENS PO) Take 1 tablet by mouth 2 (two) times daily.  Marland Kitchen omeprazole (PRILOSEC) 20 MG capsule Take 20 mg by mouth daily.  . sildenafil (VIAGRA) 100 MG tablet Take 0.5-1 tablets (50-100 mg total) by mouth daily as needed for erectile dysfunction.  . [DISCONTINUED] doxycycline (VIBRA-TABS) 100 MG tablet Take 2 tablets (200 mg total) by mouth 2 (two) times daily.   No facility-administered encounter medications on file as of 07/20/2015.      Review of Systems  Constitutional: Negative.   HENT: Negative.   Eyes: Negative.   Respiratory: Negative.   Cardiovascular: Negative.   Gastrointestinal: Positive for abdominal pain (right side at times ).  Endocrine: Negative.   Genitourinary: Negative.   Musculoskeletal: Negative.   Skin: Negative.        Right lower leg lesion  Allergic/Immunologic: Negative.  Neurological: Negative.   Hematological: Negative.   Psychiatric/Behavioral: Negative.        Objective:   Physical Exam  Constitutional: He is oriented to person, place, and time. He appears well-developed and well-nourished. No distress.  HENT:  Head: Normocephalic and atraumatic.  Right Ear: External ear normal.  Left Ear: External ear normal.  Nose: Nose normal.  Mouth/Throat: Oropharynx is clear and  moist. No oropharyngeal exudate.  Eyes: Conjunctivae and EOM are normal. Pupils are equal, round, and reactive to light. Right eye exhibits no discharge. Left eye exhibits no discharge. No scleral icterus.  Neck: Normal range of motion. Neck supple. No thyromegaly present.  No bruits or thyromegaly  Cardiovascular: Normal rate, regular rhythm, normal heart sounds and intact distal pulses.   No murmur heard. The heart is regular at 72/m  Pulmonary/Chest: Effort normal and breath sounds normal. No respiratory distress. He has no wheezes. He has no rales. He exhibits no tenderness.  Clear anteriorly and posteriorly without axillary adenopathy  Abdominal: Soft. Bowel sounds are normal. He exhibits no mass. There is no tenderness. There is no rebound and no guarding.  No liver or spleen enlargement no abdominal tenderness or masses. There is a small ventral hernia at the lower end of the chest wall incision from his bypass surgery. This is an incisional hernia.  Musculoskeletal: Normal range of motion. He exhibits no edema or tenderness.  Lymphadenopathy:    He has no cervical adenopathy.  Neurological: He is alert and oriented to person, place, and time. He has normal reflexes. No cranial nerve deficit.  Skin: Skin is warm and dry. No rash noted.  There is a thickened slightly sclerotic lesion on his right calf area which we will have the patient calls dermatologist and let them look at this for possible biopsy.  Psychiatric: He has a normal mood and affect. His behavior is normal. Judgment and thought content normal.  Nursing note and vitals reviewed.  BP 128/67 mmHg  Pulse 65  Temp(Src) 98.1 F (36.7 C) (Oral)  Ht 6\' 1"  (1.854 m)  Wt 208 lb (94.348 kg)  BMI 27.45 kg/m2        Assessment & Plan:  1. Vitamin D deficiency -Continue current treatment  2. Hyperlipidemia LDL goal <70 -Continue Repatha and aggressive therapeutic lifestyle changes  3. Essential hypertension -The blood  pressure is good today and he will continue with sodium restriction  4. Thrombocytopenia ([place]) -The most recent platelet count was normal.  5. BPH (benign prostatic hyperplasia) -The patient does have some symptoms of dribbling post voiding and we will continue to monitor this.  6. Hyperlipemia -The total LDL particle number has increased somewhat but we reassured the patient to continue with his current treatment and his therapeutic lifestyle changes that he is currently pursuing  7. Family history of bladder cancer -Follow-up with urology as needed - Urinalysis, Complete  8. B12 deficiency  9. AAA (abdominal aortic aneurysm) without rupture (HCC) - CT Abdomen Wo Contrast; Future  10. Right sided abdominal pain -The patient has had some kidney stones in the past and this may be contributing to some of his right sided abdominal pain - CT Abdomen Wo Contrast; Future -Urinalysis today  Patient Instructions                       Medicare Annual Wellness Visit  [facility] and the medical providers at [facility] strive to bring you the best medical care.  In doing so we not only want to address your current medical conditions and concerns but also to detect new conditions early and prevent illness, disease and health-related problems.    Medicare offers a yearly Wellness Visit which allows our clinical staff to assess your need for preventative services including immunizations, lifestyle education, counseling to decrease risk of preventable diseases and screening for fall risk and other medical concerns.    This visit is provided free of charge (no copay) for all Medicare recipients. The clinical pharmacists at [facility] have begun to conduct these Wellness Visits which will also include a thorough review of all your medications.    As you primary medical provider recommend that you make an appointment for your Annual Wellness Visit if  you have not done so already this year.  You may set up this appointment before you leave today or you may call back [phone] and schedule an appointment.  Please make sure when you call that you mention that you are scheduling your Annual Wellness Visit with the clinical pharmacist so that the appointment may be made for the proper length of time.     Continue current medications. Continue good therapeutic lifestyle changes which include good diet and exercise. Fall precautions discussed with patient. If an FOBT was given today- please return it to our front desk. If you are over 70 years old - you may need Prevnar 42 or the adult Pneumonia vaccine.  **Flu shots are available--- please call and schedule a FLU-CLINIC appointment**  After your visit with [place] today you will receive a survey in the mail or online from [employer] regarding your care with [place]. Please take a moment to fill this out. Your feedback is very important to [place] as you can help [place] better understand your patient needs as well as improve your experience and satisfaction. WE CARE ABOUT YOU!!!   We will try to follow-up with Dr. Baird Lyons regarding your sleep apnea and CPAP evaluation Please arrange an appointment with the dermatologist to have him look at the lesion on your leg We will arrange for you to have a CT scan of your abdomen to evaluate for possible kidney stone and/or any other problem which could be causing your right sided abdominal pain If the CT scan is negative our next option might be looking more at your spine since this pain is worse in the morning.   Arrie Senate MD

## 2015-07-20 NOTE — Patient Instructions (Addendum)
Medicare Annual Wellness Visit  [facility] and the medical providers at [facility] strive to bring you the best medical care.  In doing so we not only want to address your current medical conditions and concerns but also to detect new conditions early and prevent illness, disease and health-related problems.    Medicare offers a yearly Wellness Visit which allows our clinical staff to assess your need for preventative services including immunizations, lifestyle education, counseling to decrease risk of preventable diseases and screening for fall risk and other medical concerns.    This visit is provided free of charge (no copay) for all Medicare recipients. The clinical pharmacists at [facility] have begun to conduct these Wellness Visits which will also include a thorough review of all your medications.    As you primary medical provider recommend that you make an appointment for your Annual Wellness Visit if you have not done so already this year.  You may set up this appointment before you leave today or you may call back [phone] and schedule an appointment.  Please make sure when you call that you mention that you are scheduling your Annual Wellness Visit with the clinical pharmacist so that the appointment may be made for the proper length of time.     Continue current medications. Continue good therapeutic lifestyle changes which include good diet and exercise. Fall precautions discussed with patient. If an FOBT was given today- please return it to our front desk. If you are over 16 years old - you may need Prevnar 68 or the adult Pneumonia vaccine.  **Flu shots are available--- please call and schedule a FLU-CLINIC appointment**  After your visit with [place] today you will receive a survey in the mail or online from [employer] regarding your care with [place]. Please take a moment to fill this out. Your feedback is very  important to [place] as you can help [place] better understand your patient needs as well as improve your experience and satisfaction. WE CARE ABOUT YOU!!!   We will try to follow-up with Dr. Baird Lyons regarding your sleep apnea and CPAP evaluation Please arrange an appointment with the dermatologist to have him look at the lesion on your leg We will arrange for you to have a CT scan of your abdomen to evaluate for possible kidney stone and/or any other problem which could be causing your right sided abdominal pain If the CT scan is negative our next option might be looking more at your spine since this pain is worse in the morning.

## 2015-07-25 ENCOUNTER — Telehealth: Payer: Self-pay | Admitting: Internal Medicine

## 2015-07-25 DIAGNOSIS — G4733 Obstructive sleep apnea (adult) (pediatric): Secondary | ICD-10-CM

## 2015-07-25 NOTE — Telephone Encounter (Addendum)
Received call from Georgina Pillion, at Dr. Tawanna Sat office. She said that patient came in to be seen at their office and complained to Dr. Laurance Flatten that he had a sleep study done at our office 1 year ago and never received response from our office regarding sleep study results.  Patient had sleep study done 05/09/14.  Patient never heard back from our office regarding results of study, (there are no phone notes showing patient attempted to contact our office regarding results).  Patient was seen by Dr. Annamaria Boots on 06/08/15, CPAP was ordered, however, Melissa from [facility] called and said that since patient has Medicare, they cannot accept the 1 year sleep study and patient would need to have another study done.  Patient was upset by this, and discussed with Dr. Tawanna Sat office that he did not feel it was appropriate that he had to pay for another sleep study when our office failed to respond to his sleep study last year after the study was done.  Roselyn Reef stated that Dr. Laurance Flatten felt it is our responsibility and that we should be responsible for the charges of the sleep study.  Roselyn Reef requested that this message be sent to [facility] because Dr. Laurance Flatten feels our office should pay the charges for the Sleep Study.   Called Melissa at [facility] to see if there was another alternative that can be done.  Melissa stated that Medicare requires patient to have sleep study and order CPAP within 6 months apart.  Patient will need to have another sleep study in order to get the CPAP order paid through Medicare.    Printed out copy of this to discuss with Lanny Hurst.  Will send to [facility] as FYI. Gave to Tammy, she stated she will take care of it.

## 2015-08-01 ENCOUNTER — Ambulatory Visit
Admission: RE | Admit: 2015-08-01 | Discharge: 2015-08-01 | Disposition: A | Discharge status: Home / Self Care | Payer: Medicare Other | Source: Ambulatory Visit | Attending: Family Medicine | Admitting: Family Medicine

## 2015-08-01 DIAGNOSIS — I714 Abdominal aortic aneurysm, without rupture, unspecified: Secondary | ICD-10-CM

## 2015-08-01 DIAGNOSIS — R109 Unspecified abdominal pain: Secondary | ICD-10-CM

## 2015-08-02 NOTE — Telephone Encounter (Signed)
We are able to get patient set up with HST tonight; reached out to patient who states he is not at home at the moment but would look at his schedule and contact me back this afternoon to come pick up HST machine if able. Pt will ask for me directly.   Rodena Piety is holding her HST for patient; both CY and TD are aware.   HST will be at no charge to patient.

## 2015-08-04 NOTE — Telephone Encounter (Signed)
Can this encounter be closed?

## 2015-08-04 NOTE — Telephone Encounter (Signed)
HST was completed on 08-02-15 and dictated results on 08-03-15. Copy of HST has been given to [facility] with [facility]-no new order needed per Melissa. I have left a message at patient's preferred number in EPIC stating I have mailed him a copy of his HST results, that [facility] has the results, and that we have kept a copy for our records. If patient has any further questions or concerns he is to call our office and ask to speak with me directly. Nothing more needed at this time.

## 2015-08-17 ENCOUNTER — Telehealth: Payer: Self-pay | Admitting: Family Medicine

## 2015-08-17 ENCOUNTER — Telehealth: Payer: Self-pay | Admitting: Internal Medicine

## 2015-08-17 NOTE — Telephone Encounter (Signed)
[name] [name]- we could use a 15 minute slot.

## 2015-08-17 NOTE — Telephone Encounter (Signed)
Also we can try his cell [phone]

## 2015-08-17 NOTE — Telephone Encounter (Signed)
Pt needs a rov before 11/14/15 to follow up on CPAP.  Pt needs appt to be a Tuesday or Thursday, and no 30 minute appts are available in that time.   CY/KW please advise on when pt can be scheduled.  Thanks!

## 2015-08-18 NOTE — Telephone Encounter (Signed)
Patient aware of CT results and radiologist recommendations.

## 2015-08-18 NOTE — Telephone Encounter (Signed)
Called and spoke with pt's wife. Pt has been scheduled for 09/21/15 at 1:30pm. Nothing further was needed.

## 2015-09-07 ENCOUNTER — Ambulatory Visit: Payer: Medicare Other | Admitting: Internal Medicine

## 2015-09-20 ENCOUNTER — Telehealth: Payer: Self-pay | Admitting: Family Medicine

## 2015-09-21 ENCOUNTER — Ambulatory Visit (INDEPENDENT_AMBULATORY_CARE_PROVIDER_SITE_OTHER): Payer: Medicare Other | Admitting: Internal Medicine

## 2015-09-21 ENCOUNTER — Encounter: Payer: Self-pay | Admitting: Internal Medicine

## 2015-09-21 VITALS — BP 118/76 | HR 49 | Ht 73.0 in | Wt 211.6 lb

## 2015-09-21 DIAGNOSIS — G4733 Obstructive sleep apnea (adult) (pediatric): Secondary | ICD-10-CM

## 2015-09-21 DIAGNOSIS — I25810 Atherosclerosis of coronary artery bypass graft(s) without angina pectoris: Secondary | ICD-10-CM | POA: Diagnosis not present

## 2015-09-21 MED ORDER — EVOLOCUMAB 140 MG/ML ~~LOC~~ SOSY: 140.0000 mg | PREFILLED_SYRINGE | SUBCUTANEOUS | Status: DC

## 2015-09-21 NOTE — Progress Notes (Signed)
03/17/14- 56 yoM never smoker referred courtesy of Dr Rayna Sexton. No sleep study Medical problems include CAD, HBP, Hiatal hernia,  His wife has told him off-and-on that he snores loudly. Recently they put a humidifier in the bedroom and this seems to help. Bedtime 11:30 to 12:30 PM, sleep latency 15 minutes, waking once or twice before up at 8 AM. Weight stable. Father snored loudly. Brother has sleep apnea with CPAP. History of CABG 9 years ago without MI. No lung disease. Remote tonsillectomy  06/08/2015-75 year old male never smoker followed for OSA, complicated by CAD/ CABG, HBP, hiatal hernia Unattended Home Sleep Test 05/09/14- AHI 24.9/ hr, desat to 85%, body weight 208 pounds FOLLOWS FOR: had sleep study 05-2014 and never had follow up afterwards. Review sleep study with patient. He admits restless sleep, daytime tiredness. We reviewed his sleep study in discussed medical significance and treatment options. Starting CPAP with auto titration.  09/21/2015-75 year old male never smoker followed for OSA, complicated by CAD/C BG, HBP, hiatal hernia CPAP auto 5-20/[facility] FOLLOWS FOR: DME: [facility]. DL attached. Pt states he is waking up with more energy and sleeping better. ? pressure change since when pressure gets too high he cant tolerate it He settled on nasal pillows mask. Sleeping better.  ROS-see HPI Constitutional:   No-   weight loss, night sweats, fevers, chills, +fatigue, lassitude. HEENT:   No-  headaches, difficulty swallowing, tooth/dental problems, sore throat,       No-  sneezing, itching, ear ache, nasal congestion, post nasal drip,  CV:  No-   chest pain, orthopnea, PND, swelling in lower extremities, anasarca,                                                       dizziness, palpitations Resp: No-   shortness of breath with exertion or at rest.              No-   productive cough,  No non-productive cough,  No- coughing up of blood.              No-   change in color  of mucus.  No- wheezing.   Skin: No-   rash or lesions. GI:  No-   heartburn, indigestion, abdominal pain, nausea, vomiting, diarrhea,                 change in bowel habits, loss of appetite GU: No-   dysuria, change in color of urine, no urgency or frequency.  No- flank pain. MS:  + joint pain or swelling.  No- decreased range of motion.  No- back pain. Neuro-     nothing unusual Psych:  No- change in mood or affect. No depression or anxiety.  No memory loss.  OBJ- Physical Exam General- Alert, Oriented, Affect-appropriate, Distress- none acute, tall, not obese Skin- rash-none, lesions- none, excoriation- none Lymphadenopathy- none Head- atraumatic            Eyes- Gross vision intact, PERRLA, conjunctivae and secretions clear            Ears- Hearing, canals-normal            Nose- Clear, no-Septal dev, mucus, polyps, erosion, perforation             Throat- Mallampati II-III , mucosa clear , drainage- none, tonsils- atrophic Neck- flexible ,  trachea midline, no stridor , thyroid nl, carotid no bruit Chest - symmetrical excursion , unlabored           Heart/CV- RRR , no murmur , no gallop  , no rub, nl s1 s2                           - JVD- none , edema- none, stasis changes- none, varices- none           Lung- clear to P&A, wheeze- none, cough- none , dullness-none, rub- none           Chest wall-  Abd- tender-no, distended-no, bowel sounds-present, HSM- no Br/ Gen/ Rectal- Not done, not indicated Extrem- cyanosis- none, clubbing, none, atrophy- none, strength- nl Neuro- grossly intact to observation

## 2015-09-21 NOTE — Patient Instructions (Addendum)
Order- DME [facility]- please change CPAP to auto 5-12, continue mask of choice, supplies, humidifier, AirView   Dx OSA  Please call as needed

## 2015-09-21 NOTE — Telephone Encounter (Signed)
Received communication from [place] that they are checking into assistance for patient for [name] [name]. Until they are able to determine if he will qualify I have left #2 samples for patient.

## 2015-09-24 NOTE — Assessment & Plan Note (Signed)
Auto range 5-20 is going a little too high for his comfort. Download documents excellent compliance and control. Pressure is usually staying around 10.8 so we're going to try reducing auto range to 5-12. Comfort measures reviewed.

## 2015-09-26 ENCOUNTER — Other Ambulatory Visit: Payer: Self-pay | Admitting: Dermatology

## 2015-09-26 DIAGNOSIS — L820 Inflamed seborrheic keratosis: Secondary | ICD-10-CM | POA: Diagnosis not present

## 2015-09-26 DIAGNOSIS — D485 Neoplasm of uncertain behavior of skin: Secondary | ICD-10-CM | POA: Diagnosis not present

## 2015-09-28 ENCOUNTER — Telehealth: Payer: Self-pay | Admitting: Family Medicine

## 2015-09-28 NOTE — Telephone Encounter (Signed)
Patient to bring in patient assistance paperwork for Repatha for [place] to finish filling out and then fax to [employer].

## 2015-09-29 ENCOUNTER — Telehealth: Payer: Self-pay | Admitting: Family Medicine

## 2015-09-29 NOTE — Telephone Encounter (Signed)
Patient called about having me help fill out his forms for patient/grant assistance for Repatha. He will drop off forms today and I will have Dr. Laurance Flatten sign them Monday and fax them off.  Patient is on vacation next week.

## 2015-10-03 NOTE — Telephone Encounter (Signed)
Patient called to verify that paperwork for patient assistance program has been sent - I faxed Monday, July 31st. Patient aware

## 2015-10-10 ENCOUNTER — Encounter: Payer: Self-pay | Admitting: Internal Medicine

## 2015-10-10 DIAGNOSIS — G4733 Obstructive sleep apnea (adult) (pediatric): Secondary | ICD-10-CM

## 2015-10-12 ENCOUNTER — Telehealth: Payer: Self-pay | Admitting: Family Medicine

## 2015-10-12 NOTE — Telephone Encounter (Signed)
Patient called to check on Repatha safety net application.  Was faxed 10/02/2015.   Called Repatha [employer] - they did not have record so refaxed application today.

## 2015-10-23 ENCOUNTER — Telehealth: Payer: Self-pay | Admitting: Family Medicine

## 2015-10-24 MED ORDER — EVOLOCUMAB 140 MG/ML ~~LOC~~ SOSY: 140.0000 mg | PREFILLED_SYRINGE | SUBCUTANEOUS | 0 refills | Status: DC

## 2015-10-24 NOTE — Telephone Encounter (Signed)
Left #1 Repatha for patient to pick up. [facility] - verified they have received application.  Representative that application is in the last stage of verification and patient should hear something in the next 5 to 7 days.

## 2015-11-03 ENCOUNTER — Telehealth: Payer: Self-pay | Admitting: Family Medicine

## 2015-11-07 NOTE — Telephone Encounter (Signed)
Patient was able to get through today - Tuesday 11/07/15.  He was notified that he was approved for Repatha through [place] [employer] through 03/03/2106.

## 2015-11-14 DIAGNOSIS — L821 Other seborrheic keratosis: Secondary | ICD-10-CM | POA: Diagnosis not present

## 2015-11-14 DIAGNOSIS — D239 Other benign neoplasm of skin, unspecified: Secondary | ICD-10-CM | POA: Diagnosis not present

## 2015-11-14 DIAGNOSIS — L918 Other hypertrophic disorders of the skin: Secondary | ICD-10-CM | POA: Diagnosis not present

## 2015-11-15 ENCOUNTER — Other Ambulatory Visit: Payer: Self-pay | Admitting: *Deleted

## 2015-11-15 ENCOUNTER — Telehealth: Payer: Self-pay | Admitting: Family Medicine

## 2015-11-15 DIAGNOSIS — E785 Hyperlipidemia, unspecified: Secondary | ICD-10-CM

## 2015-11-15 DIAGNOSIS — D696 Thrombocytopenia, unspecified: Secondary | ICD-10-CM

## 2015-11-15 DIAGNOSIS — Z8052 Family history of malignant neoplasm of bladder: Secondary | ICD-10-CM

## 2015-11-15 DIAGNOSIS — E538 Deficiency of other specified B group vitamins: Secondary | ICD-10-CM

## 2015-11-15 DIAGNOSIS — I10 Essential (primary) hypertension: Secondary | ICD-10-CM

## 2015-11-15 DIAGNOSIS — E559 Vitamin D deficiency, unspecified: Secondary | ICD-10-CM

## 2015-11-15 NOTE — Telephone Encounter (Signed)
Please review

## 2015-11-15 NOTE — Telephone Encounter (Signed)
Labs ordered.

## 2015-11-16 ENCOUNTER — Other Ambulatory Visit: Payer: Medicare Other

## 2015-11-16 DIAGNOSIS — Z8052 Family history of malignant neoplasm of bladder: Secondary | ICD-10-CM

## 2015-11-16 DIAGNOSIS — D696 Thrombocytopenia, unspecified: Secondary | ICD-10-CM | POA: Diagnosis not present

## 2015-11-16 DIAGNOSIS — E785 Hyperlipidemia, unspecified: Secondary | ICD-10-CM | POA: Diagnosis not present

## 2015-11-16 DIAGNOSIS — E538 Deficiency of other specified B group vitamins: Secondary | ICD-10-CM

## 2015-11-16 DIAGNOSIS — I10 Essential (primary) hypertension: Secondary | ICD-10-CM | POA: Diagnosis not present

## 2015-11-16 DIAGNOSIS — E559 Vitamin D deficiency, unspecified: Secondary | ICD-10-CM | POA: Diagnosis not present

## 2015-11-17 ENCOUNTER — Telehealth: Payer: Self-pay | Admitting: Family Medicine

## 2015-11-17 LAB — CBC WITH DIFFERENTIAL/PLATELET
BASOS: 0 %
Basophils Absolute: 0 10*3/uL (ref 0.0–0.2)
EOS (ABSOLUTE): 0.1 10*3/uL (ref 0.0–0.4)
Eos: 3 %
Hematocrit: 43.1 % (ref 37.5–51.0)
Hemoglobin: 14.2 g/dL (ref 12.6–17.7)
Immature Grans (Abs): 0 10*3/uL (ref 0.0–0.1)
Immature Granulocytes: 0 %
LYMPHS ABS: 1.4 10*3/uL (ref 0.7–3.1)
Lymphs: 27 %
MCH: 31.1 pg (ref 26.6–33.0)
MCHC: 32.9 g/dL (ref 31.5–35.7)
MCV: 94 fL (ref 79–97)
MONOS ABS: 0.6 10*3/uL (ref 0.1–0.9)
Monocytes: 12 %
NEUTROS ABS: 2.9 10*3/uL (ref 1.4–7.0)
Neutrophils: 58 %
PLATELETS: 140 10*3/uL — AB (ref 150–379)
RBC: 4.57 x10E6/uL (ref 4.14–5.80)
RDW: 14 % (ref 12.3–15.4)
WBC: 5.1 10*3/uL (ref 3.4–10.8)

## 2015-11-17 LAB — NMR, LIPOPROFILE
Cholesterol: 133 mg/dL (ref 100–199)
HDL Cholesterol by NMR: 51 mg/dL (ref >39)
HDL Particle Number: 31.2 umol/L (ref >=30.5)
LDL Particle Number: 1007 nmol/L — ABNORMAL HIGH (ref <1000)
LDL SIZE: 20.7 nm (ref >20.5)
LDL-C: 64 mg/dL (ref 0–99)
LP-IR Score: 31 (ref <=45)
SMALL LDL PARTICLE NUMBER: 546 nmol/L — AB (ref <=527)
TRIGLYCERIDES BY NMR: 89 mg/dL (ref 0–149)

## 2015-11-17 LAB — HEPATIC FUNCTION PANEL
ALT: 20 IU/L (ref 0–44)
AST: 25 IU/L (ref 0–40)
Albumin: 4.4 g/dL (ref 3.5–4.8)
Alkaline Phosphatase: 62 IU/L (ref 39–117)
BILIRUBIN, DIRECT: 0.13 mg/dL (ref 0.00–0.40)
Bilirubin Total: 0.5 mg/dL (ref 0.0–1.2)
Total Protein: 6.6 g/dL (ref 6.0–8.5)

## 2015-11-17 LAB — BMP8+EGFR
BUN/Creatinine Ratio: 26 — ABNORMAL HIGH (ref 10–24)
BUN: 22 mg/dL (ref 8–27)
CO2: 26 mmol/L (ref 18–29)
Calcium: 9.3 mg/dL (ref 8.6–10.2)
Chloride: 103 mmol/L (ref 96–106)
Creatinine, Ser: 0.85 mg/dL (ref 0.76–1.27)
GFR, EST AFRICAN AMERICAN: 100 mL/min/{1.73_m2} (ref >59)
GFR, EST NON AFRICAN AMERICAN: 86 mL/min/{1.73_m2} (ref >59)
Glucose: 94 mg/dL (ref 65–99)
POTASSIUM: 4.2 mmol/L (ref 3.5–5.2)
SODIUM: 143 mmol/L (ref 134–144)

## 2015-11-17 LAB — VITAMIN D 25 HYDROXY (VIT D DEFICIENCY, FRACTURES): VIT D 25 HYDROXY: 80.3 ng/mL (ref 30.0–100.0)

## 2015-11-17 NOTE — Telephone Encounter (Signed)
Patient aware of lab results.

## 2015-11-17 NOTE — Telephone Encounter (Signed)
Tried to contact patient and no answer.  

## 2015-11-22 DIAGNOSIS — N5201 Erectile dysfunction due to arterial insufficiency: Secondary | ICD-10-CM | POA: Diagnosis not present

## 2015-11-22 DIAGNOSIS — R310 Gross hematuria: Secondary | ICD-10-CM | POA: Diagnosis not present

## 2015-11-23 ENCOUNTER — Ambulatory Visit (INDEPENDENT_AMBULATORY_CARE_PROVIDER_SITE_OTHER): Payer: Medicare Other

## 2015-11-23 ENCOUNTER — Ambulatory Visit (INDEPENDENT_AMBULATORY_CARE_PROVIDER_SITE_OTHER): Payer: Medicare Other | Admitting: Family Medicine

## 2015-11-23 ENCOUNTER — Encounter: Payer: Self-pay | Admitting: Family Medicine

## 2015-11-23 VITALS — BP 113/61 | HR 53 | Temp 98.2°F | Ht 73.0 in | Wt 210.0 lb

## 2015-11-23 DIAGNOSIS — D696 Thrombocytopenia, unspecified: Secondary | ICD-10-CM

## 2015-11-23 DIAGNOSIS — R059 Cough, unspecified: Secondary | ICD-10-CM

## 2015-11-23 DIAGNOSIS — R05 Cough: Secondary | ICD-10-CM

## 2015-11-23 DIAGNOSIS — E785 Hyperlipidemia, unspecified: Secondary | ICD-10-CM | POA: Diagnosis not present

## 2015-11-23 DIAGNOSIS — I714 Abdominal aortic aneurysm, without rupture, unspecified: Secondary | ICD-10-CM

## 2015-11-23 DIAGNOSIS — I10 Essential (primary) hypertension: Secondary | ICD-10-CM | POA: Diagnosis not present

## 2015-11-23 DIAGNOSIS — Z8052 Family history of malignant neoplasm of bladder: Secondary | ICD-10-CM | POA: Diagnosis not present

## 2015-11-23 DIAGNOSIS — E538 Deficiency of other specified B group vitamins: Secondary | ICD-10-CM

## 2015-11-23 DIAGNOSIS — N400 Enlarged prostate without lower urinary tract symptoms: Secondary | ICD-10-CM

## 2015-11-23 DIAGNOSIS — J40 Bronchitis, not specified as acute or chronic: Secondary | ICD-10-CM

## 2015-11-23 DIAGNOSIS — E559 Vitamin D deficiency, unspecified: Secondary | ICD-10-CM

## 2015-11-23 DIAGNOSIS — R109 Unspecified abdominal pain: Secondary | ICD-10-CM

## 2015-11-23 DIAGNOSIS — N5201 Erectile dysfunction due to arterial insufficiency: Secondary | ICD-10-CM

## 2015-11-23 DIAGNOSIS — I25810 Atherosclerosis of coronary artery bypass graft(s) without angina pectoris: Secondary | ICD-10-CM

## 2015-11-23 DIAGNOSIS — J209 Acute bronchitis, unspecified: Secondary | ICD-10-CM

## 2015-11-23 MED ORDER — AZITHROMYCIN 250 MG PO TABS: ORAL_TABLET | ORAL | 0 refills | Status: DC

## 2015-11-23 NOTE — Progress Notes (Signed)
Subjective:    Patient ID: [name] [name], male    DOB: 12-15-1940, 75 y.o.   MRN: [id number]  HPI Pt here for follow up and management of chronic medical problems which includes hyperlipidemia and hypertension. He is taking medications regularly.She is doing well overall. He is had lab work done and we will review this with him during the visit. He is due to get a chest x-ray and to return an FOBT. He does complain of some right sided abdominal pain as well as some cough and congestion with green sputum. He is not running a fever. The patient is on Repatha. His LDL particle number was 1007. His LDL C was good at 64 and triglycerides were good at 89. The good cholesterol remains within normal limits. The blood sugar was good and electrolytes and renal function were good. The CBC has a normal white blood cell count with a good hemoglobin at 14.2. He also has diminished platelets which she has had in the past. All liver function tests were normal and his vitamin D level was excellent at 80.3. The patient denies any chest pain or shortness of breath other than the congestion he has with his cough. He has no trouble with swallowing heartburn indigestion nausea vomiting diarrhea or blood in the stool. He does have a slight sore throat. He has not had a fever. He's passing his water without problems. On reviewing his record he had a CT scan with contrast of the abdomen and this only showed up the small infrarenal abdominal aneurysm. The plans were to repeat this again in 4 years with an ultrasound.    Patient Active Problem List   Diagnosis Date Noted  . AAA (abdominal aortic aneurysm) without rupture ([facility]) 07/20/2015  . Obstructive sleep apnea 03/17/2014  . Erectile dysfunction due to arterial insufficiency 01/12/2014  . Thrombocytopenia ([facility]) 12/30/2013  . B12 deficiency 10/01/2012  . Allergic rhinitis 10/01/2012  . BPH (benign prostatic hyperplasia) 10/01/2012  . PVC's (premature ventricular  contractions) 09/01/2009  . COLONIC POLYPS 04/14/2009  . Hyperlipidemia LDL goal <70 04/14/2009  . Essential hypertension 04/14/2009  . Coronary atherosclerosis 04/14/2009  . HIATAL HERNIA 04/14/2009  . HERNIATED DISC 04/14/2009   Outpatient Encounter Prescriptions as of 11/23/2015  Medication Sig  . aspirin 81 MG tablet Take 1 tablet twice a day  . Cholecalciferol (VITAMIN D3) 5000 UNITS CAPS Take 1 capsule by mouth daily.  . cyclobenzaprine (FLEXERIL) 5 MG tablet Take 1 tablet (5 mg total) by mouth 3 (three) times daily as needed for muscle spasms.  . Evolocumab (REPATHA) 140 MG/ML SOSY Inject 140 mg into the skin as directed.  . fluticasone (FLONASE) 50 MCG/ACT nasal spray USE 2 SPRAYS IN EACH NOSTRIL ONCE DAILY. (Patient taking differently: USE 2 SPRAYS IN EACH NOSTRIL ONCE DAILY. as needed)  . meloxicam (MOBIC) 15 MG tablet TAKE (1) TABLET DAILY AS DIRECTED. (Patient taking differently: TAKE (1) TABLET DAILY AS DIRECTED. PRN medication)  . metoprolol tartrate (LOPRESSOR) 25 MG tablet Take 1 tablet (25 mg total) by mouth as directed. Take one tablet every AM and 2 tablets every PM  . Misc Natural Products (OSTEO BI-FLEX ADV JOINT SHIELD PO) Take 1 capsule by mouth 2 (two) times daily.   . Multiple Vitamin (MULTI VITAMIN MENS PO) Take 1 tablet by mouth 2 (two) times daily.  Marland Kitchen omeprazole (PRILOSEC) 20 MG capsule Take 20 mg by mouth daily.  . sildenafil (VIAGRA) 100 MG tablet Take 0.5-1 tablets (50-100 mg total) by  mouth daily as needed for erectile dysfunction.   No facility-administered encounter medications on file as of 11/23/2015.       Review of Systems  Constitutional: Negative.   HENT: Positive for congestion (green).   Eyes: Negative.   Respiratory: Positive for cough.   Cardiovascular: Negative.   Gastrointestinal: Positive for abdominal pain (right side at times).  Endocrine: Negative.   Genitourinary: Negative.   Musculoskeletal: Negative.   Skin: Negative.     Allergic/Immunologic: Negative.   Neurological: Negative.   Hematological: Negative.   Psychiatric/Behavioral: Negative.        Objective:   Physical Exam  Constitutional: He is oriented to person, place, and time. He appears well-developed and well-nourished. No distress.  The patient is alert and pleasant  HENT:  Head: Normocephalic and atraumatic.  Nose: Nose normal.  Mouth/Throat: Oropharynx is clear and moist. No oropharyngeal exudate.  Bilateral ears cerumen  Eyes: Conjunctivae and EOM are normal. Pupils are equal, round, and reactive to light. Right eye exhibits no discharge. Left eye exhibits no discharge. No scleral icterus.  Neck: Normal range of motion. Neck supple. No thyromegaly present.  No bruits thyroid enlargement or adenopathy  Cardiovascular: Normal rate, regular rhythm, normal heart sounds and intact distal pulses.   No murmur heard. The heart has a regular rate and rhythm at 60/m  Pulmonary/Chest: Effort normal and breath sounds normal. No respiratory distress. He has no wheezes. He has no rales. He exhibits no tenderness.  Bronchial congestion with coughing  Abdominal: Soft. Bowel sounds are normal. He exhibits no mass. There is no tenderness. There is no rebound and no guarding.  No liver or spleen enlargement. No epigastric tenderness and no suprapubic tenderness and no inguinal adenopathy  Genitourinary:  Genitourinary Comments: The patient just had a rectal exam yesterday by the urologist  Musculoskeletal: Normal range of motion. He exhibits no edema or tenderness.  Lymphadenopathy:    He has no cervical adenopathy.  Neurological: He is alert and oriented to person, place, and time. He has normal reflexes. No cranial nerve deficit.  Skin: Skin is warm and dry. No rash noted.  Psychiatric: He has a normal mood and affect. His behavior is normal. Judgment and thought content normal.  Nursing note and vitals reviewed.  BP 113/61 (BP Location: Left Arm)    Pulse (!) 53   Temp 98.2 F (36.8 C) (Oral)   Ht 6\' 1"  (1.854 m)   Wt 210 lb (95.3 kg)   BMI 27.71 kg/m         Assessment & Plan:  1. Hyperlipidemia LDL goal <70 -The LDL C is good at less than 70 and he will continue with the Repatha  2. Essential hypertension -Blood pressure is good and he will continue with current treatment - DG Chest 2 View; Future  3. Vitamin D deficiency -Vitamin D level is excellent and he will continue with 5000 units daily  4. Thrombocytopenia (HCC) -The platelet count remains slightly decreased at 140,000 but the patient has no bleeding problems.  5. BPH (benign prostatic hyperplasia) -The patient just saw the urologist yesterday. He had a rectal exam.  6. Family history of bladder cancer -Continue to be followed as needed by the urologist because of the family history of bladder cancer  7. B12 deficiency -Continue current treatment  8. Cough -Take Mucinex for cough and congestion - DG Chest 2 View; Future  9. Right sided abdominal pain -No reason has been found for this and the patient  has had CT scans colonoscopies and the recent visit to the urologist.  10. Erectile dysfunction due to arterial insufficiency -Continue current treatment  11. AAA (abdominal aortic aneurysm) without rupture ([facility]) -Follow-up as planned  12. Bronchitis with bronchospasm -Take Zithromax and use Mucinex and drink plenty of fluids  Patient Instructions                       Medicare Annual Wellness Visit  [facility] and the medical providers at [facility] strive to bring you the best medical care.  In doing so we not only want to address your current medical conditions and concerns but also to detect new conditions early and prevent illness, disease and health-related problems.    Medicare offers a yearly Wellness Visit which allows our clinical staff to assess your need for preventative services including immunizations, lifestyle  education, counseling to decrease risk of preventable diseases and screening for fall risk and other medical concerns.    This visit is provided free of charge (no copay) for all Medicare recipients. The clinical pharmacists at [facility] have begun to conduct these Wellness Visits which will also include a thorough review of all your medications.    As you primary medical provider recommend that you make an appointment for your Annual Wellness Visit if you have not done so already this year.  You may set up this appointment before you leave today or you may call back [phone] and schedule an appointment.  Please make sure when you call that you mention that you are scheduling your Annual Wellness Visit with the clinical pharmacist so that the appointment may be made for the proper length of time.     Continue current medications. Continue good therapeutic lifestyle changes which include good diet and exercise. Fall precautions discussed with patient. If an FOBT was given today- please return it to our front desk. If you are over 41 years old - you may need Prevnar 34 or the adult Pneumonia vaccine.  **Flu shots are available--- please call and schedule a FLU-CLINIC appointment**  After your visit with [place] today you will receive a survey in the mail or online from [employer] regarding your care with [place]. Please take a moment to fill this out. Your feedback is very important to [place] as you can help [place] better understand your patient needs as well as improve your experience and satisfaction. WE CARE ABOUT YOU!!!   Continue to take Mucinex maximum strength, blue and white in color one twice daily for cough and congestion large glass of water Take antibiotic as directed To drink plenty of fluids and avoid irritating environments Follow-up with cardiology and urology as planned    Arrie Senate MD

## 2015-11-23 NOTE — Patient Instructions (Addendum)
Medicare Annual Wellness Visit  [facility] and the medical providers at [facility] strive to bring you the best medical care.  In doing so we not only want to address your current medical conditions and concerns but also to detect new conditions early and prevent illness, disease and health-related problems.    Medicare offers a yearly Wellness Visit which allows our clinical staff to assess your need for preventative services including immunizations, lifestyle education, counseling to decrease risk of preventable diseases and screening for fall risk and other medical concerns.    This visit is provided free of charge (no copay) for all Medicare recipients. The clinical pharmacists at [facility] have begun to conduct these Wellness Visits which will also include a thorough review of all your medications.    As you primary medical provider recommend that you make an appointment for your Annual Wellness Visit if you have not done so already this year.  You may set up this appointment before you leave today or you may call back [phone] and schedule an appointment.  Please make sure when you call that you mention that you are scheduling your Annual Wellness Visit with the clinical pharmacist so that the appointment may be made for the proper length of time.     Continue current medications. Continue good therapeutic lifestyle changes which include good diet and exercise. Fall precautions discussed with patient. If an FOBT was given today- please return it to our front desk. If you are over 64 years old - you may need Prevnar 67 or the adult Pneumonia vaccine.  **Flu shots are available--- please call and schedule a FLU-CLINIC appointment**  After your visit with [place] today you will receive a survey in the mail or online from [employer] regarding your care with [place]. Please take a moment to fill this out. Your feedback is very  important to [place] as you can help [place] better understand your patient needs as well as improve your experience and satisfaction. WE CARE ABOUT YOU!!!   Continue to take Mucinex maximum strength, blue and white in color one twice daily for cough and congestion large glass of water Take antibiotic as directed To drink plenty of fluids and avoid irritating environments Follow-up with cardiology and urology as planned   [place]

## 2015-11-23 NOTE — Addendum Note (Signed)
Addended by: Zannie Cove on: 11/23/2015 03:08 PM   Modules accepted: Orders

## 2015-11-24 ENCOUNTER — Other Ambulatory Visit: Payer: Self-pay | Admitting: *Deleted

## 2015-11-24 DIAGNOSIS — M8580 Other specified disorders of bone density and structure, unspecified site: Secondary | ICD-10-CM

## 2015-11-29 ENCOUNTER — Other Ambulatory Visit: Payer: Medicare Other

## 2015-11-29 DIAGNOSIS — Z1211 Encounter for screening for malignant neoplasm of colon: Secondary | ICD-10-CM

## 2015-12-01 LAB — FECAL OCCULT BLOOD, IMMUNOCHEMICAL: Fecal Occult Bld: NEGATIVE

## 2015-12-06 ENCOUNTER — Encounter: Payer: Self-pay | Admitting: Cardiology

## 2015-12-11 ENCOUNTER — Encounter: Payer: Self-pay | Admitting: Internal Medicine

## 2015-12-19 NOTE — Progress Notes (Signed)
.   HPI The patient presents for evaluation of palpitations. He has worn a monitor which demonstrates PVCs. Since I last saw him he's having his sleep apnea treated. He's not really having any more palpitations. He is active on the elliptical and golfing. The patient denies any new symptoms such as chest discomfort, neck or arm discomfort. There has been no new shortness of breath, PND or orthopnea. There have been no reported palpitations, presyncope or syncope.  Allergies  Allergen Reactions  . Levofloxacin     Caused C- Diff  . Crestor [Rosuvastatin] Other (See Comments)    Liver problems  . Vytorin [Ezetimibe-Simvastatin] Other (See Comments)    Liver problems    Current Outpatient Prescriptions  Medication Sig Dispense Refill  . aspirin 81 MG tablet Take 81 mg by mouth 2 (two) times daily.     . Cholecalciferol (VITAMIN D3) 5000 UNITS CAPS Take 1 capsule by mouth daily.    . Evolocumab (REPATHA) 140 MG/ML SOSY Inject 140 mg into the skin as directed. 1 mL 0  . fluticasone (FLONASE) 50 MCG/ACT nasal spray USE 2 SPRAYS IN EACH NOSTRIL ONCE DAILY. (Patient taking differently: USE 2 SPRAYS IN EACH NOSTRIL ONCE DAILY. as needed) 16 g 5  . meloxicam (MOBIC) 15 MG tablet TAKE (1) TABLET DAILY AS DIRECTED. (Patient taking differently: TAKE (1) TABLET DAILY AS DIRECTED. PRN medication) 30 tablet 2  . metoprolol tartrate (LOPRESSOR) 25 MG tablet Take 1 tablet (25 mg total) by mouth as directed. Take one tablet every AM and 2 tablets every PM 270 tablet 3  . Multiple Vitamin (MULTI VITAMIN MENS PO) Take 1 tablet by mouth 2 (two) times daily.    Marland Kitchen omeprazole (PRILOSEC) 20 MG capsule Take 20 mg by mouth daily.    . sildenafil (VIAGRA) 100 MG tablet Take 0.5-1 tablets (50-100 mg total) by mouth daily as needed for erectile dysfunction. 6 tablet 11  . nitroGLYCERIN (NITROSTAT) 0.4 MG SL tablet Place 1 tablet (0.4 mg total) under the tongue every 5 (five) minutes as needed for chest pain. 25 tablet  prn   No current facility-administered medications for this visit.     Past Medical History:  Diagnosis Date  . CAD (coronary artery disease)   . Colon polyps   . Dyslipidemia   . Herniated disc   . Hiatal hernia   . HTN (hypertension)   . Hyperlipidemia   . Kidney stones     Past Surgical History:  Procedure Laterality Date  . CORONARY ARTERY BYPASS GRAFT  2006   LIMA to LAD, SVG to RCA, SVG to circumflex, SVG to diagonal   . mole removed  2003  . right knee surgery  2008  . TONSILLECTOMY      ROS:    Otherwise as stated in the HPI and negative for all other systems.  PHYSICAL EXAM BP (!) 150/84   Pulse 60   Ht 6\' 1"  (1.854 m)   Wt 205 lb (93 kg)   BMI 27.05 kg/m  GENERAL:  Well appearing NECK:  No jugular venous distention, waveform within normal limits, carotid upstroke brisk and symmetric, no bruits, no thyromegaly LUNGS:  Clear to auscultation bilaterally BACK:  No CVA tenderness CHEST:  Well healed sternotomy scar. HEART:  PMI not displaced or sustained,S1 and S2 within normal limits, no S3, no S4, no clicks, no rubs, no murmurs ABD:  Flat, positive bowel sounds normal in frequency in pitch, no bruits, no rebound, no guarding, no midline pulsatile  mass, no hepatomegaly, no splenomegaly EXT:  2 plus pulses throughout, mild ankle edema, no cyanosis no clubbing  EKG:  Sinus rhythm, rate 60, axis within normal limits, intervals within normal limits, no acute ST-T wave changes.  ASSESSMENT AND PLAN  PALPITATIONS:  These have not been problematic. No change in therapy is indicated.  CAD:  He had a negative stress test in 2015 and I do not see any reason for further evaluation for obstructive coronary disease. He will continue with risk reduction.  HTN:  The blood pressure is slightly elevated today but this is unusual.. No change in medications is indicated. We will continue with therapeutic lifestyle changes (TLC).  DYSLIPIDEMIA:   He is now being treated with  Repatha.  No change in therapy is indicated.  SLEEP APNEA:  He is having this managed with CPAP although he lost his machine on an airplane and needs to get it back.

## 2015-12-20 ENCOUNTER — Ambulatory Visit (INDEPENDENT_AMBULATORY_CARE_PROVIDER_SITE_OTHER): Payer: Medicare Other | Admitting: Cardiology

## 2015-12-20 ENCOUNTER — Ambulatory Visit (INDEPENDENT_AMBULATORY_CARE_PROVIDER_SITE_OTHER): Payer: Medicare Other

## 2015-12-20 ENCOUNTER — Encounter: Payer: Self-pay | Admitting: Cardiology

## 2015-12-20 VITALS — BP 150/84 | HR 60 | Ht 73.0 in | Wt 205.0 lb

## 2015-12-20 DIAGNOSIS — I2510 Atherosclerotic heart disease of native coronary artery without angina pectoris: Secondary | ICD-10-CM

## 2015-12-20 DIAGNOSIS — R29890 Loss of height: Secondary | ICD-10-CM

## 2015-12-20 DIAGNOSIS — I10 Essential (primary) hypertension: Secondary | ICD-10-CM

## 2015-12-20 DIAGNOSIS — I25810 Atherosclerosis of coronary artery bypass graft(s) without angina pectoris: Secondary | ICD-10-CM

## 2015-12-20 DIAGNOSIS — M8580 Other specified disorders of bone density and structure, unspecified site: Secondary | ICD-10-CM

## 2015-12-20 MED ORDER — NITROGLYCERIN 0.4 MG SL SUBL: 0.4000 mg | SUBLINGUAL_TABLET | SUBLINGUAL | 99 refills | Status: DC | PRN

## 2015-12-20 NOTE — Patient Instructions (Signed)

## 2015-12-21 ENCOUNTER — Ambulatory Visit (INDEPENDENT_AMBULATORY_CARE_PROVIDER_SITE_OTHER): Payer: Medicare Other | Admitting: Family Medicine

## 2015-12-21 ENCOUNTER — Encounter: Payer: Self-pay | Admitting: Family Medicine

## 2015-12-21 VITALS — BP 121/71 | HR 60 | Temp 98.0°F | Ht 73.0 in | Wt 203.0 lb

## 2015-12-21 DIAGNOSIS — F418 Other specified anxiety disorders: Secondary | ICD-10-CM

## 2015-12-21 DIAGNOSIS — Z23 Encounter for immunization: Secondary | ICD-10-CM

## 2015-12-21 DIAGNOSIS — I25810 Atherosclerosis of coronary artery bypass graft(s) without angina pectoris: Secondary | ICD-10-CM

## 2015-12-21 MED ORDER — BUSPIRONE HCL 15 MG PO TABS: 15.0000 mg | ORAL_TABLET | Freq: Two times a day (BID) | ORAL | 2 refills | Status: DC | PRN

## 2015-12-21 NOTE — Progress Notes (Signed)
Subjective:    Patient ID: [name] [name], male    DOB: 1940-07-03, 75 y.o.   MRN: [id number]  HPI Patient here today to discuss some recent anxiety. This patient is very stressed and anxious. He had received a phone call from his former employer mentioning that someone is suing the company because of a patient who passed away who had asbestosis. He is very anxious because he was the Engineer, building services at the time. We had a long discussion about this. He says he is having insomnia. He is very anxious and waking up in the middle of the night in a sweat.    Patient Active Problem List   Diagnosis Date Noted  . AAA (abdominal aortic aneurysm) without rupture ([name]) 07/20/2015  . Obstructive sleep apnea 03/17/2014  . Erectile dysfunction due to arterial insufficiency 01/12/2014  . Thrombocytopenia ([place]) 12/30/2013  . B12 deficiency 10/01/2012  . Allergic rhinitis 10/01/2012  . BPH (benign prostatic hyperplasia) 10/01/2012  . PVC's (premature ventricular contractions) 09/01/2009  . COLONIC POLYPS 04/14/2009  . Hyperlipidemia LDL goal <70 04/14/2009  . Essential hypertension 04/14/2009  . Coronary atherosclerosis 04/14/2009  . HIATAL HERNIA 04/14/2009  . HERNIATED DISC 04/14/2009   Outpatient Encounter Prescriptions as of 12/21/2015  Medication Sig  . aspirin 81 MG tablet Take 81 mg by mouth 2 (two) times daily.   . Cholecalciferol (VITAMIN D3) 5000 UNITS CAPS Take 1 capsule by mouth daily.  . Evolocumab (REPATHA) 140 MG/ML SOSY Inject 140 mg into the skin as directed.  . fluticasone (FLONASE) 50 MCG/ACT nasal spray USE 2 SPRAYS IN EACH NOSTRIL ONCE DAILY. (Patient taking differently: USE 2 SPRAYS IN EACH NOSTRIL ONCE DAILY. as needed)  . meloxicam (MOBIC) 15 MG tablet TAKE (1) TABLET DAILY AS DIRECTED. (Patient taking differently: TAKE (1) TABLET DAILY AS DIRECTED. PRN medication)  . metoprolol tartrate (LOPRESSOR) 25 MG tablet Take 1 tablet (25 mg total) by mouth as directed. Take one  tablet every AM and 2 tablets every PM  . Multiple Vitamin (MULTI VITAMIN MENS PO) Take 1 tablet by mouth 2 (two) times daily.  . nitroGLYCERIN (NITROSTAT) 0.4 MG SL tablet Place 1 tablet (0.4 mg total) under the tongue every 5 (five) minutes as needed for chest pain.  Marland Kitchen omeprazole (PRILOSEC) 20 MG capsule Take 20 mg by mouth daily.  . sildenafil (VIAGRA) 100 MG tablet Take 0.5-1 tablets (50-100 mg total) by mouth daily as needed for erectile dysfunction.   No facility-administered encounter medications on file as of 12/21/2015.       Review of Systems  Constitutional: Negative.   HENT: Negative.   Eyes: Negative.   Respiratory: Negative.   Cardiovascular: Negative.   Gastrointestinal: Negative.   Endocrine: Negative.   Genitourinary: Negative.   Musculoskeletal: Negative.   Skin: Negative.   Allergic/Immunologic: Negative.   Neurological: Negative.   Hematological: Negative.   Psychiatric/Behavioral: The patient is nervous/anxious.        Objective:   Physical Exam  Constitutional: He is oriented to person, place, and time. He appears well-developed and well-nourished. He appears distressed.  HENT:  Head: Normocephalic.  Eyes: Conjunctivae and EOM are normal. Pupils are equal, round, and reactive to light. Right eye exhibits no discharge. Left eye exhibits no discharge. No scleral icterus.  Neck: Normal range of motion.  Musculoskeletal: Normal range of motion.  Neurological: He is alert and oriented to person, place, and time.  Increased anxiety and stress regarding this incident  Skin: Skin is warm and  dry.  Psychiatric: He has a normal mood and affect. His behavior is normal. Judgment and thought content normal.   BP 121/71 (BP Location: Left Arm)   Pulse 60   Temp 98 F (36.7 C) (Oral)   Ht 6\' 1"  (1.854 m)   Wt 203 lb (92.1 kg)   BMI 26.78 kg/m   A long discussion between the patient and myself and he plans to get legal advice and we will start a mild  antianxiety medicine for him to take regularly.      Assessment & Plan:  .1. Situational anxiety -Take BuSpar 15 mg twice daily -Reduce caffeine intake -Seek legal advice -Call in one week for progress  Meds ordered this encounter  Medications  . busPIRone (BUSPAR) 15 MG tablet    Sig: Take 1 tablet (15 mg total) by mouth 2 (two) times daily as needed.    Dispense:  60 tablet    Refill:  2   Patient Instructions  Take medication regularly Try to get some legal advise Call us in 1 week and let us know how you are doing Reduce caffeine intake   Arrie Senate MD

## 2015-12-21 NOTE — Patient Instructions (Signed)
Take medication regularly Try to get some legal advise Call us in 1 week and let us know how you are doing Reduce caffeine intake

## 2015-12-26 ENCOUNTER — Telehealth: Payer: Self-pay | Admitting: Family Medicine

## 2015-12-26 NOTE — Telephone Encounter (Signed)
In addition to what he is taking try taking some melatonin 3 mg at bedtime

## 2015-12-26 NOTE — Telephone Encounter (Signed)
pt aware 

## 2015-12-26 NOTE — Telephone Encounter (Signed)
Pt states that Buspar is helping some, his days are good, but nights are harder. He is taking the med BID and the late  One is taken about 8 pm.   He did speak with Lynnae Sandhoff and he made him feel some better.  He wants to know what he can do for nighttime?

## 2016-01-01 ENCOUNTER — Telehealth: Payer: Self-pay | Admitting: Family Medicine

## 2016-01-03 ENCOUNTER — Telehealth: Payer: Self-pay | Admitting: Family Medicine

## 2016-01-03 NOTE — Telephone Encounter (Signed)
Please see noted - we are aware of this and are working on it.  Will take time - has been discussed with patient's wife.

## 2016-01-03 NOTE — Telephone Encounter (Signed)
Patient brought in notification that his insurance will prefer Praluent over [place] starting Jan 2018.  We will try to see if he can remain on Repatha - will try for PA.  Patient's wife notified.

## 2016-01-04 ENCOUNTER — Telehealth: Payer: Self-pay | Admitting: Family Medicine

## 2016-01-04 NOTE — Telephone Encounter (Signed)
Assurred patient that we are working on getting PA for [place].  Will not be affected until January 2018.

## 2016-01-04 NOTE — Telephone Encounter (Signed)
Spoke with pt regarding Repatha PA Informed pt that Lynelle Smoke is working on [employer] understanding

## 2016-01-10 ENCOUNTER — Telehealth: Payer: Self-pay | Admitting: Pharmacist

## 2016-01-10 NOTE — Telephone Encounter (Signed)
Repatha has been approved through his insurance through 03/03/2017

## 2016-01-23 ENCOUNTER — Telehealth: Payer: Self-pay | Admitting: Pharmacist

## 2016-01-23 NOTE — Telephone Encounter (Signed)
Patient to bring in application for [employer] patient assistance information (he has approval through 2017).

## 2016-01-29 ENCOUNTER — Other Ambulatory Visit: Payer: Self-pay | Admitting: Cardiology

## 2016-01-29 NOTE — Telephone Encounter (Signed)
REFILL 

## 2016-02-02 ENCOUNTER — Telehealth: Payer: Self-pay | Admitting: Pharmacist

## 2016-02-02 NOTE — Telephone Encounter (Signed)
Paperwork has been filled out an faxed to [employer].   I spoke with representative from [place].  Cannot apply for assistance through [employer] until he fills Rx in 2018 and only if there is a high copay.  I recommend that he try to fill Repatha in Jan 2018 and we can then set in motion application for [employer] if needed.

## 2016-02-15 ENCOUNTER — Telehealth: Payer: Self-pay | Admitting: Family Medicine

## 2016-02-15 NOTE — Telephone Encounter (Signed)
Pt is aware we have a sample of Repatha and I am waiting to find out the new expiration date of [employer]'s state license to call [place].

## 2016-02-15 NOTE — Telephone Encounter (Signed)
Called [employer] with Dr Tawanna Sat new license number expiration date.

## 2016-03-01 ENCOUNTER — Other Ambulatory Visit: Payer: Self-pay | Admitting: Pharmacist

## 2016-03-01 MED ORDER — EVOLOCUMAB 140 MG/ML ~~LOC~~ SOSY: 140.0000 mg | PREFILLED_SYRINGE | SUBCUTANEOUS | 3 refills | Status: DC

## 2016-03-12 ENCOUNTER — Other Ambulatory Visit: Payer: Self-pay | Admitting: Family Medicine

## 2016-03-22 ENCOUNTER — Other Ambulatory Visit: Payer: Medicare Other

## 2016-03-22 DIAGNOSIS — D696 Thrombocytopenia, unspecified: Secondary | ICD-10-CM | POA: Diagnosis not present

## 2016-03-22 DIAGNOSIS — I10 Essential (primary) hypertension: Secondary | ICD-10-CM

## 2016-03-22 DIAGNOSIS — E559 Vitamin D deficiency, unspecified: Secondary | ICD-10-CM | POA: Diagnosis not present

## 2016-03-22 DIAGNOSIS — E785 Hyperlipidemia, unspecified: Secondary | ICD-10-CM | POA: Diagnosis not present

## 2016-03-23 LAB — CBC WITH DIFFERENTIAL/PLATELET
Basophils Absolute: 0 10*3/uL (ref 0.0–0.2)
Basos: 0 %
EOS (ABSOLUTE): 0 10*3/uL (ref 0.0–0.4)
EOS: 0 %
HEMATOCRIT: 42.1 % (ref 37.5–51.0)
HEMOGLOBIN: 14.1 g/dL (ref 13.0–17.7)
IMMATURE GRANS (ABS): 0 10*3/uL (ref 0.0–0.1)
Immature Granulocytes: 0 %
LYMPHS ABS: 1.5 10*3/uL (ref 0.7–3.1)
LYMPHS: 22 %
MCH: 32.3 pg (ref 26.6–33.0)
MCHC: 33.5 g/dL (ref 31.5–35.7)
MCV: 96 fL (ref 79–97)
MONOCYTES: 7 %
Monocytes Absolute: 0.5 10*3/uL (ref 0.1–0.9)
Neutrophils Absolute: 4.8 10*3/uL (ref 1.4–7.0)
Neutrophils: 71 %
Platelets: 167 10*3/uL (ref 150–379)
RBC: 4.37 x10E6/uL (ref 4.14–5.80)
RDW: 13.6 % (ref 12.3–15.4)
WBC: 6.8 10*3/uL (ref 3.4–10.8)

## 2016-03-23 LAB — BMP8+EGFR
BUN/Creatinine Ratio: 20 (ref 10–24)
BUN: 21 mg/dL (ref 8–27)
CALCIUM: 9.2 mg/dL (ref 8.6–10.2)
CO2: 26 mmol/L (ref 18–29)
CREATININE: 1.04 mg/dL (ref 0.76–1.27)
Chloride: 104 mmol/L (ref 96–106)
GFR calc Af Amer: 82 mL/min/{1.73_m2} (ref >59)
GFR, EST NON AFRICAN AMERICAN: 71 mL/min/{1.73_m2} (ref >59)
Glucose: 108 mg/dL — ABNORMAL HIGH (ref 65–99)
Potassium: 4.6 mmol/L (ref 3.5–5.2)
SODIUM: 143 mmol/L (ref 134–144)

## 2016-03-23 LAB — HEPATIC FUNCTION PANEL
ALK PHOS: 59 IU/L (ref 39–117)
ALT: 17 IU/L (ref 0–44)
AST: 23 IU/L (ref 0–40)
Albumin: 4.2 g/dL (ref 3.5–4.8)
BILIRUBIN, DIRECT: 0.2 mg/dL (ref 0.00–0.40)
Bilirubin Total: 0.6 mg/dL (ref 0.0–1.2)
Total Protein: 6.4 g/dL (ref 6.0–8.5)

## 2016-03-23 LAB — NMR, LIPOPROFILE
CHOLESTEROL: 138 mg/dL (ref 100–199)
HDL CHOLESTEROL BY NMR: 60 mg/dL (ref >39)
HDL PARTICLE NUMBER: 37.6 umol/L (ref >=30.5)
LDL Particle Number: 998 nmol/L (ref <1000)
LDL Size: 21.3 nm (ref >20.5)
LDL-C: 63 mg/dL (ref 0–99)
LP-IR Score: 40 (ref <=45)
Small LDL Particle Number: 366 nmol/L (ref <=527)
TRIGLYCERIDES BY NMR: 73 mg/dL (ref 0–149)

## 2016-03-23 LAB — VITAMIN D 25 HYDROXY (VIT D DEFICIENCY, FRACTURES): VIT D 25 HYDROXY: 69 ng/mL (ref 30.0–100.0)

## 2016-03-26 ENCOUNTER — Ambulatory Visit: Payer: Medicare Other | Admitting: Internal Medicine

## 2016-03-28 ENCOUNTER — Ambulatory Visit (INDEPENDENT_AMBULATORY_CARE_PROVIDER_SITE_OTHER): Payer: Medicare Other | Admitting: Family Medicine

## 2016-03-28 ENCOUNTER — Encounter: Payer: Self-pay | Admitting: Internal Medicine

## 2016-03-28 ENCOUNTER — Encounter: Payer: Self-pay | Admitting: Family Medicine

## 2016-03-28 ENCOUNTER — Ambulatory Visit (INDEPENDENT_AMBULATORY_CARE_PROVIDER_SITE_OTHER): Payer: Medicare Other | Admitting: Internal Medicine

## 2016-03-28 VITALS — BP 113/72 | HR 65 | Temp 97.6°F | Ht 73.0 in | Wt 209.0 lb

## 2016-03-28 VITALS — BP 126/80 | HR 52 | Ht 73.0 in | Wt 211.2 lb

## 2016-03-28 DIAGNOSIS — I493 Ventricular premature depolarization: Secondary | ICD-10-CM

## 2016-03-28 DIAGNOSIS — G4733 Obstructive sleep apnea (adult) (pediatric): Secondary | ICD-10-CM | POA: Diagnosis not present

## 2016-03-28 DIAGNOSIS — D696 Thrombocytopenia, unspecified: Secondary | ICD-10-CM | POA: Diagnosis not present

## 2016-03-28 DIAGNOSIS — I10 Essential (primary) hypertension: Secondary | ICD-10-CM

## 2016-03-28 DIAGNOSIS — N400 Enlarged prostate without lower urinary tract symptoms: Secondary | ICD-10-CM | POA: Diagnosis not present

## 2016-03-28 DIAGNOSIS — Z8052 Family history of malignant neoplasm of bladder: Secondary | ICD-10-CM

## 2016-03-28 DIAGNOSIS — E785 Hyperlipidemia, unspecified: Secondary | ICD-10-CM | POA: Diagnosis not present

## 2016-03-28 DIAGNOSIS — K439 Ventral hernia without obstruction or gangrene: Secondary | ICD-10-CM | POA: Insufficient documentation

## 2016-03-28 DIAGNOSIS — E559 Vitamin D deficiency, unspecified: Secondary | ICD-10-CM

## 2016-03-28 LAB — URINALYSIS, COMPLETE
BILIRUBIN UA: NEGATIVE
GLUCOSE, UA: NEGATIVE
KETONES UA: NEGATIVE
LEUKOCYTES UA: NEGATIVE
NITRITE UA: NEGATIVE
Protein, UA: NEGATIVE
RBC UA: NEGATIVE
SPEC GRAV UA: 1.025 (ref 1.005–1.030)
Urobilinogen, Ur: 0.2 mg/dL (ref 0.2–1.0)
pH, UA: 5 (ref 5.0–7.5)

## 2016-03-28 LAB — MICROSCOPIC EXAMINATION
Bacteria, UA: NONE SEEN
EPITHELIAL CELLS (NON RENAL): NONE SEEN /HPF (ref 0–10)
RBC MICROSCOPIC, UA: NONE SEEN /HPF (ref 0–?)
Renal Epithel, UA: NONE SEEN /hpf
WBC, UA: NONE SEEN /hpf (ref 0–?)

## 2016-03-28 NOTE — Patient Instructions (Addendum)
Medicare Annual Wellness Visit  [facility] and the medical providers at [facility] strive to bring you the best medical care.  In doing so we not only want to address your current medical conditions and concerns but also to detect new conditions early and prevent illness, disease and health-related problems.    Medicare offers a yearly Wellness Visit which allows our clinical staff to assess your need for preventative services including immunizations, lifestyle education, counseling to decrease risk of preventable diseases and screening for fall risk and other medical concerns.    This visit is provided free of charge (no copay) for all Medicare recipients. The clinical pharmacists at [facility] have begun to conduct these Wellness Visits which will also include a thorough review of all your medications.    As you primary medical provider recommend that you make an appointment for your Annual Wellness Visit if you have not done so already this year.  You may set up this appointment before you leave today or you may call back [phone] and schedule an appointment.  Please make sure when you call that you mention that you are scheduling your Annual Wellness Visit with the clinical pharmacist so that the appointment may be made for the proper length of time.     Continue current medications. Continue good therapeutic lifestyle changes which include good diet and exercise. Fall precautions discussed with patient. If an FOBT was given today- please return it to our front desk. If you are over 72 years old - you may need Prevnar 4 or the adult Pneumonia vaccine.  **Flu shots are available--- please call and schedule a FLU-CLINIC appointment**  After your visit with [place] today you will receive a survey in the mail or online from [employer] regarding your care with [place]. Please take a moment to fill this out. Your feedback is very  important to [place] as you can help [place] better understand your patient needs as well as improve your experience and satisfaction. WE CARE ABOUT YOU!!!   Continue to follow-up with cardiology Reduce the number of M&M's daily Work on the weight Continue with repatha

## 2016-03-28 NOTE — Patient Instructions (Addendum)
Order- Replacement CPAP machine of choice, auto 5-20, mask of choice, humidifier, supplies, download capable for pressure compliance or AirView    Dx OSA  Please call as needed

## 2016-03-28 NOTE — Progress Notes (Signed)
Subjective:    Patient ID: [name] [name], male    DOB: 1940/09/06, 76 y.o.   MRN: [id number]  HPI Pt here for follow up and management of chronic medical problems which includes hyperlipidemia. He is taking medication regularly.The patient is doing well with minimal complaints. He is had his lab work done and we will review this with him during the visit today. Cholesterol numbers liver function test vitamin D levels and renal function and CBC are all within normal limits. The blood sugar slightly elevated at 108. He continues to be followed by the cardiologist because of his past CABG. The patient's weight is up 6 pounds since the last visit in October. I will point this out to him today. He does like M&M's! Unfortunately! He sees a cardiologist once yearly in the fall. He has not been able to exercise and/or play golf recently because of the cold weather. Hopefully when he is able to get more exercise some of the weight will come off. His body mass index is slightly over 25. The patient denies any chest pain or shortness of breath. He denies any trouble with heartburn indigestion nausea vomiting diarrhea blood in the stool or black tarry bowel movements. He is passing his water without problems. He does have to take a proton pump inhibitor to control his reflux.    Patient Active Problem List   Diagnosis Date Noted  . AAA (abdominal aortic aneurysm) without rupture ([place]) 07/20/2015  . Obstructive sleep apnea 03/17/2014  . Erectile dysfunction due to arterial insufficiency 01/12/2014  . Thrombocytopenia ([place]) 12/30/2013  . B12 deficiency 10/01/2012  . Allergic rhinitis 10/01/2012  . BPH (benign prostatic hyperplasia) 10/01/2012  . PVC's (premature ventricular contractions) 09/01/2009  . COLONIC POLYPS 04/14/2009  . Hyperlipidemia LDL goal <70 04/14/2009  . Essential hypertension 04/14/2009  . Coronary atherosclerosis 04/14/2009  . HIATAL HERNIA 04/14/2009  . HERNIATED DISC 04/14/2009     Outpatient Encounter Prescriptions as of 03/28/2016  Medication Sig  . aspirin 81 MG tablet Take 81 mg by mouth 2 (two) times daily.   . busPIRone (BUSPAR) 15 MG tablet Take 1 tablet (15 mg total) by mouth 2 (two) times daily as needed.  . Cholecalciferol (VITAMIN D3) 5000 UNITS CAPS Take 1 capsule by mouth daily.  . Evolocumab (REPATHA) 140 MG/ML SOSY Inject 140 mg into the skin every 14 (fourteen) days.  . fluticasone (FLONASE) 50 MCG/ACT nasal spray USE 2 SPRAYS IN EACH NOSTRIL ONCE DAILY. (Patient taking differently: USE 2 SPRAYS IN EACH NOSTRIL ONCE DAILY. as needed)  . meloxicam (MOBIC) 15 MG tablet TAKE (1) TABLET DAILY AS DIRECTED. (Patient taking differently: TAKE (1) TABLET DAILY AS DIRECTED. PRN medication)  . metoprolol tartrate (LOPRESSOR) 25 MG tablet TAKE 1 TABLET IN THE MORNING AND 2 TABLETS IN THE EVENING  . Multiple Vitamin (MULTI VITAMIN MENS PO) Take 1 tablet by mouth 2 (two) times daily.  Marland Kitchen omeprazole (PRILOSEC) 20 MG capsule Take 20 mg by mouth daily.  . sildenafil (VIAGRA) 100 MG tablet Take 0.5-1 tablets (50-100 mg total) by mouth daily as needed for erectile dysfunction.  . nitroGLYCERIN (NITROSTAT) 0.4 MG SL tablet Place 1 tablet (0.4 mg total) under the tongue every 5 (five) minutes as needed for chest pain.   No facility-administered encounter medications on file as of 03/28/2016.      Review of Systems  Constitutional: Negative.   HENT: Negative.   Eyes: Negative.   Respiratory: Negative.   Cardiovascular: Negative.   Gastrointestinal:  Negative.   Endocrine: Negative.   Genitourinary: Negative.   Musculoskeletal: Negative.   Skin: Negative.   Allergic/Immunologic: Negative.   Neurological: Negative.   Hematological: Negative.   Psychiatric/Behavioral: Negative.        Objective:   Physical Exam  Constitutional: He is oriented to person, place, and time. He appears well-developed and well-nourished. No distress.  HENT:  Head: Normocephalic and  atraumatic.  Right Ear: External ear normal.  Left Ear: External ear normal.  Nose: Nose normal.  Mouth/Throat: Oropharynx is clear and moist. No oropharyngeal exudate.  Eyes: Conjunctivae and EOM are normal. Pupils are equal, round, and reactive to light. Right eye exhibits no discharge. Left eye exhibits no discharge. No scleral icterus.  Neck: Normal range of motion. Neck supple. No thyromegaly present.  No thyromegaly bruits or adenopathy  Cardiovascular: Normal rate, regular rhythm, normal heart sounds and intact distal pulses.   No murmur heard. The heart is regular at 60/m  Pulmonary/Chest: Effort normal and breath sounds normal. No respiratory distress. He has no wheezes. He has no rales. He exhibits no tenderness.  No axillary adenopathy. The chest is clear anteriorly and posteriorly. There is an incisional hernia in the epigastric area at the bottom of the CABG scar.  Abdominal: Soft. Bowel sounds are normal. He exhibits no mass. There is no tenderness. There is no rebound and no guarding.  No liver or spleen enlargement no masses. No bruits.  Musculoskeletal: Normal range of motion. He exhibits no edema.  Lymphadenopathy:    He has no cervical adenopathy.  Neurological: He is alert and oriented to person, place, and time. He has normal reflexes. No cranial nerve deficit.  Skin: Skin is warm and dry. No rash noted.  Psychiatric: He has a normal mood and affect. His behavior is normal. Judgment and thought content normal.  Nursing note and vitals reviewed.  BP 113/72 (BP Location: Left Arm)   Pulse 65   Temp 97.6 F (36.4 C) (Oral)   Ht 6\' 1"  (1.854 m)   Wt 209 lb (94.8 kg)   BMI 27.57 kg/m         Assessment & Plan:  1. Hyperlipidemia LDL goal <70 *-Cholesterol numbers are under control with his PCS K-9 inhibitor treatment he will continue with this along with therapeutic lifestyle changes  2. Essential hypertension -Blood pressure is good today and he will continue  with current treatment  3. Vitamin D deficiency -Continue with current vitamin D replacement  4. Thrombocytopenia ([place]) -The most recent platelet count was within normal limits.  5. Benign prostatic hyperplasia, unspecified whether lower urinary tract symptoms present -No complaints with voiding.  6. Family history of bladder cancer - Urinalysis, Complete  7. Ventral hernia without obstruction or gangrene -This hernia is at the lower incision site for his CABG surgery. He is currently not giving him any problems.  Patient Instructions                       Medicare Annual Wellness Visit  [facility] and the medical providers at [facility] strive to bring you the best medical care.  In doing so we not only want to address your current medical conditions and concerns but also to detect new conditions early and prevent illness, disease and health-related problems.    Medicare offers a yearly Wellness Visit which allows our clinical staff to assess your need for preventative services including immunizations, lifestyle education, counseling to decrease risk  of preventable diseases and screening for fall risk and other medical concerns.    This visit is provided free of charge (no copay) for all Medicare recipients. The clinical pharmacists at [facility] have begun to conduct these Wellness Visits which will also include a thorough review of all your medications.    As you primary medical provider recommend that you make an appointment for your Annual Wellness Visit if you have not done so already this year.  You may set up this appointment before you leave today or you may call back [phone] and schedule an appointment.  Please make sure when you call that you mention that you are scheduling your Annual Wellness Visit with the clinical pharmacist so that the appointment may be made for the proper length of time.     Continue current  medications. Continue good therapeutic lifestyle changes which include good diet and exercise. Fall precautions discussed with patient. If an FOBT was given today- please return it to our front desk. If you are over 78 years old - you may need Prevnar 40 or the adult Pneumonia vaccine.  **Flu shots are available--- please call and schedule a FLU-CLINIC appointment**  After your visit with [place] today you will receive a survey in the mail or online from [employer] regarding your care with [place]. Please take a moment to fill this out. Your feedback is very important to [place] as you can help [place] better understand your patient needs as well as improve your experience and satisfaction. WE CARE ABOUT YOU!!!   Continue to follow-up with cardiology Reduce the number of M&M's daily Work on the weight Continue with repatha   Arrie Senate MD

## 2016-03-28 NOTE — Progress Notes (Signed)
HPI male never smoker followed for OSA, complicated by CAD/CABG, HBP, hiatal hernia Unattended Home Sleep Test 05/09/14- AHI 24.9/ hr, desat to 85%, body weight 208 pounds  -----------------------------------------------------------  03/28/2016-76 year old male never smoker followed for OSA, complicated by CAD/CABG, HBP, hiatal hernia CPAP auto 5-20/Advanced FOLLOWS FOR:DME: [facility]. Pt states he has lost his CPAP machine back in [place] not used since then. Was lost on airline and unable to get new one under insurance and was told he could get Rx from [place] to purchase from DME or online. DL prior to losing machine is attached.  He has been very comfortable with CPAP indefinitely slept better with it. Has been trying to deal with his insurance to get lost machine replaced but is going to have to self-pay this time. Download available confirming excellent compliance and control. Pressure usually between 6 and 10.  ROS-see HPI     + = pos Constitutional:   No-   weight loss, night sweats, fevers, chills, fatigue, lassitude. HEENT:   No-  headaches, difficulty swallowing, tooth/dental problems, sore throat,       No-  sneezing, itching, ear ache, nasal congestion, post nasal drip,  CV:  No-   chest pain, orthopnea, PND, swelling in lower extremities, anasarca,                                                       dizziness, palpitations Resp: No-   shortness of breath with exertion or at rest.              No-   productive cough,  No non-productive cough,  No- coughing up of blood.              No-   change in color of mucus.  No- wheezing.   Skin: No-   rash or lesions. GI:  No-   heartburn, indigestion, abdominal pain, nausea, vomiting, diarrhea,                 change in bowel habits, loss of appetite GU: No-   dysuria, change in color of urine, no urgency or frequency.  No- flank pain. MS:  + joint pain or swelling.  No- decreased range of motion.  No- back pain. Neuro-     nothing  unusual Psych:  No- change in mood or affect. No depression or anxiety.  No memory loss.  OBJ- Physical Exam   + = pos General- Alert, Oriented, Affect-appropriate, Distress- none acute, tall, not obese Skin- rash-none, lesions- none, excoriation- none Lymphadenopathy- none Head- atraumatic            Eyes- Gross vision intact, PERRLA, conjunctivae and secretions clear            Ears- Hearing, canals-normal            Nose- Clear, no-Septal dev, mucus, polyps, erosion, perforation             Throat- Mallampati II-III , mucosa clear , drainage- none, tonsils- atrophic Neck- flexible , trachea midline, no stridor , thyroid nl, carotid no bruit Chest - symmetrical excursion , unlabored           Heart/CV- RRR , no murmur , no gallop  , no rub, nl s1 s2                           -  JVD- none , edema- none, stasis changes- none, varices- none           Lung- clear to P&A, wheeze- none, cough- none , dullness-none, rub- none           Chest wall-  Abd- tender-no, distended-no, bowel sounds-present, HSM- no Br/ Gen/ Rectal- Not done, not indicated Extrem- cyanosis- none, clubbing, none, atrophy- none, strength- nl Neuro- grossly intact to observation. Relaxed and conversational. He does not seem tired.

## 2016-03-30 NOTE — Assessment & Plan Note (Signed)
He was better with CPAP and using it routinely as verified by download. He needs machine replaced and we discussed how to do that. Plan-replacement CPAP machine to continue current settings.

## 2016-03-30 NOTE — Assessment & Plan Note (Signed)
Pulse was regular at this visit. He is to continue with cardiology/PCP

## 2016-04-11 DIAGNOSIS — H524 Presbyopia: Secondary | ICD-10-CM | POA: Diagnosis not present

## 2016-04-11 DIAGNOSIS — H52203 Unspecified astigmatism, bilateral: Secondary | ICD-10-CM | POA: Diagnosis not present

## 2016-04-11 DIAGNOSIS — H2513 Age-related nuclear cataract, bilateral: Secondary | ICD-10-CM | POA: Diagnosis not present

## 2016-06-10 ENCOUNTER — Other Ambulatory Visit: Payer: Self-pay | Admitting: Family Medicine

## 2016-07-25 ENCOUNTER — Ambulatory Visit (INDEPENDENT_AMBULATORY_CARE_PROVIDER_SITE_OTHER): Payer: Medicare Other | Admitting: *Deleted

## 2016-07-25 VITALS — BP 112/66 | HR 47 | Temp 97.5°F | Ht 71.5 in | Wt 209.6 lb

## 2016-07-25 DIAGNOSIS — E559 Vitamin D deficiency, unspecified: Secondary | ICD-10-CM | POA: Diagnosis not present

## 2016-07-25 DIAGNOSIS — Z23 Encounter for immunization: Secondary | ICD-10-CM

## 2016-07-25 DIAGNOSIS — E785 Hyperlipidemia, unspecified: Secondary | ICD-10-CM

## 2016-07-25 DIAGNOSIS — N400 Enlarged prostate without lower urinary tract symptoms: Secondary | ICD-10-CM

## 2016-07-25 DIAGNOSIS — Z0000 Encounter for general adult medical examination without abnormal findings: Secondary | ICD-10-CM | POA: Diagnosis not present

## 2016-07-25 DIAGNOSIS — I10 Essential (primary) hypertension: Secondary | ICD-10-CM | POA: Diagnosis not present

## 2016-07-25 NOTE — Patient Instructions (Signed)
  [name] [name] , Thank you for taking time to come for your Medicare Wellness Visit. I appreciate your ongoing commitment to your health goals. Please review the following plan we discussed and let me know if I can assist you in the future.   These are the goals we discussed: Goals    . Exercise 3x per week (30 min per time)    . Have 3 meals a day       This is a list of the screening recommended for you and due dates:  Health Maintenance  Topic Date Due  . Flu Shot  10/02/2016  . Stool Blood Test  11/28/2016  . Tetanus Vaccine  01/02/2021  . Pneumonia vaccines  Completed

## 2016-07-25 NOTE — Progress Notes (Signed)
Subjective:   [name] [name] is a 76 y.o. male who presents for an Initial Medicare Annual Wellness Visit.  Review of Systems  Patient is here today for his Initial Medicare Annual Wellness visit. Patient is a 76 year old male who lives in [place] with his wife. He retired from the [employer] after 40 years. 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 He enjoys gardening, yard work, sports, golf, traveling, playing sudoku, and keeping up with current events. He goes to the gym, and plays golf three days a week for exercise. He states that is diet is healthy. He is on the [employer] at [employer], in a mens group, and is a Immunologist at [employer]. He has 2 children a son and a daughter. He has two inside house cats. Fall hazards were discussed as he has stairs in the home, 2 inside cats, and a rug which has a non skid bottom. He states that his health is the same as it was last year at this time.     Objective:    Today's Vitals   07/25/16 1030 07/25/16 1031  BP: (!) 144/75 112/66  Pulse: (!) 45 (!) 47  Temp: 97.5 F (36.4 C)   TempSrc: Oral   Weight: 209 lb 9.6 oz (95.1 kg)   Height: 5' 11.5" (1.816 m)    Body mass index is 28.83 kg/m.  Current Medications (verified) Outpatient Encounter Prescriptions as of 07/25/2016  Medication Sig  . aspirin 81 MG tablet Take 81 mg by mouth 2 (two) times daily.   . Black Pepper-Turmeric (TURMERIC COMPLEX/BLACK PEPPER PO) Take by mouth.  . busPIRone (BUSPAR) 15 MG tablet Take 1 tablet (15 mg total) by mouth 2 (two) times daily as needed.  . Cholecalciferol (VITAMIN D3) 5000 UNITS CAPS Take 1 capsule by mouth daily.  . Evolocumab (REPATHA) 140 MG/ML SOSY Inject 140 mg into the skin every 14 (fourteen) days.  . fluticasone (FLONASE) 50 MCG/ACT nasal spray USE 2 SPRAYS IN EACH NOSTRIL ONCE DAILY. (Patient taking differently: USE 2 SPRAYS IN EACH NOSTRIL ONCE DAILY. as needed)  . glucosamine-chondroitin 500-400 MG tablet Take 1 tablet by mouth 3  (three) times daily.  . meloxicam (MOBIC) 15 MG tablet TAKE (1) TABLET DAILY AS DIRECTED.  Marland Kitchen metoprolol tartrate (LOPRESSOR) 25 MG tablet TAKE 1 TABLET IN THE MORNING AND 2 TABLETS IN THE EVENING  . Multiple Vitamin (MULTI VITAMIN MENS PO) Take 1 tablet by mouth 2 (two) times daily.  . nitroGLYCERIN (NITROSTAT) 0.4 MG SL tablet Place 1 tablet (0.4 mg total) under the tongue every 5 (five) minutes as needed for chest pain.  . Omega-3 Fatty Acids (SUPER OMEGA-3 PO) Take by mouth.  Marland Kitchen omeprazole (PRILOSEC) 20 MG capsule Take 20 mg by mouth daily.  . sildenafil (VIAGRA) 100 MG tablet Take 0.5-1 tablets (50-100 mg total) by mouth daily as needed for erectile dysfunction.   No facility-administered encounter medications on file as of 07/25/2016.     Allergies (verified) Levofloxacin; Crestor [rosuvastatin]; and Vytorin [ezetimibe-simvastatin]   History: Past Medical History:  Diagnosis Date  . CAD (coronary artery disease)   . Colon polyps   . Dyslipidemia   . Herniated disc   . Hiatal hernia   . HTN (hypertension)   . Hyperlipidemia   . Kidney stones    Past Surgical History:  Procedure Laterality Date  . CORONARY ARTERY BYPASS GRAFT  2006   LIMA to LAD, SVG to RCA, SVG to circumflex, SVG to  diagonal   . mole removed  2003  . right knee surgery  2008  . TONSILLECTOMY     Family History  Problem Relation Age of Onset  . Heart disease Father   . Cancer Father        Bladder Cancer  . Heart attack Father   . Hypertension Mother   . Alzheimer's disease Mother   . Stroke Mother   . Pneumonia Sister   . Heart disease Brother        stents  . Diabetes Brother        type 2   Social History   Occupational History  . retired-general Freight forwarder for [employer] History Main Topics  . Smoking status: Never Smoker  . Smokeless tobacco: Never Used     Comment: does not smoke   . Alcohol use 0.0 oz/week     Comment: with meal  . Drug use: No  . Sexual activity: Yes     Tobacco Counseling Counseling given: Not Answered Patient has never been a smoker and does not use any tobacco.   Activities of Daily Living In your present state of health, do you have any difficulty performing the following activities: 07/25/2016  Hearing? Y  Vision? N  Difficulty concentrating or making decisions? N  Walking or climbing stairs? Y  Dressing or bathing? N  Doing errands, shopping? N  Some recent data might be hidden  Patient states that he has ringing in his ears that sometimes affects his hearing. Patient states that he has trouble climbing stairs at times due to knee pain.  Immunizations and Health Maintenance Immunization History  Administered Date(s) Administered  . Influenza, High Dose Seasonal PF 12/21/2015  . Influenza,inj,Quad PF,36+ Mos 12/03/2012, 12/30/2013, 01/03/2015  . Pneumococcal Conjugate-13 01/12/2014  . Pneumococcal Polysaccharide-23 10/01/2012  . Zoster Recombinat (Shingrix) 07/25/2016   There are no preventive care reminders to display for this patient.  Patient Care Team: Chipper Herb, MD as PCP - General (Family Medicine) Rutherford Guys, MD as Consulting Physician (Ophthalmology) Clarene Essex, MD as Consulting Physician (Gastroenterology)  Indicate any recent Medical Services you may have received from other than [facility] providers in the past year (date may be approximate).    Assessment:   This is a routine wellness examination for [name] [name].   Hearing/Vision screen Patient has a ringing in his ears that affects his hearing at times.  Patient wears glasses. Dietary issues and exercise activities discussed:    Goals    . Exercise 3x per week (30 min per time)    . Have 3 meals a day      Depression Screen PHQ 2/9 Scores 07/25/2016 03/28/2016 12/21/2015 11/23/2015  PHQ - 2 Score 0 0 2 0  PHQ- 9 Score - - 5 -    Fall Risk Fall Risk  07/25/2016 03/28/2016 12/21/2015 11/23/2015 07/20/2015  Falls in the past year? No No No No No     Cognitive Function: MMSE - Mini Mental State Exam 07/25/2016  Orientation to time 5  Orientation to Place 5  Registration 3  Attention/ Calculation 5  Recall 3  Language- name 2 objects 2  Language- repeat 1  Language- follow 3 step command 3  Language- read & follow direction 1  Write a sentence 1  Copy design 1  Total score 30  Patient scored a 30 out of 30.       Screening Tests Health Maintenance  Topic Date Due  . INFLUENZA  VACCINE  10/02/2016  . COLON CANCER SCREENING ANNUAL FOBT  11/28/2016  . TETANUS/TDAP  01/02/2021  . PNA vac Low Risk Adult  Completed        Plan:   Patient to follow up with PCP as planned.  Patient is currently up to date on all health maintenance.   I have personally reviewed and noted the following in the patient's chart:   . Medical and social history . Use of alcohol, tobacco or illicit drugs  . Current medications and supplements . Functional ability and status . Nutritional status . Physical activity . Advanced directives . List of other physicians . Hospitalizations, surgeries, and ER visits in previous 12 months . Vitals . Screenings to include cognitive, depression, and falls . Referrals and appointments  In addition, I have reviewed and discussed with patient certain preventive protocols, quality metrics, and best practice recommendations. A written personalized care plan for preventive services as well as general preventive health recommendations were provided to patient.     Gareth Morgan, LPN   08/25/4693   Arrie Senate MD

## 2016-07-26 LAB — CBC WITH DIFFERENTIAL/PLATELET
Basophils Absolute: 0 10*3/uL (ref 0.0–0.2)
Basos: 0 %
EOS (ABSOLUTE): 0.1 10*3/uL (ref 0.0–0.4)
EOS: 1 %
HEMATOCRIT: 41.3 % (ref 37.5–51.0)
HEMOGLOBIN: 13.8 g/dL (ref 13.0–17.7)
Immature Grans (Abs): 0 10*3/uL (ref 0.0–0.1)
Immature Granulocytes: 0 %
LYMPHS ABS: 1.4 10*3/uL (ref 0.7–3.1)
Lymphs: 25 %
MCH: 31.4 pg (ref 26.6–33.0)
MCHC: 33.4 g/dL (ref 31.5–35.7)
MCV: 94 fL (ref 79–97)
MONOCYTES: 7 %
Monocytes Absolute: 0.4 10*3/uL (ref 0.1–0.9)
NEUTROS ABS: 3.7 10*3/uL (ref 1.4–7.0)
Neutrophils: 67 %
Platelets: 155 10*3/uL (ref 150–379)
RBC: 4.4 x10E6/uL (ref 4.14–5.80)
RDW: 14.4 % (ref 12.3–15.4)
WBC: 5.6 10*3/uL (ref 3.4–10.8)

## 2016-07-26 LAB — NMR, LIPOPROFILE
Cholesterol: 114 mg/dL (ref 100–199)
HDL Cholesterol by NMR: 50 mg/dL (ref >39)
HDL Particle Number: 33.3 umol/L (ref >=30.5)
LDL Particle Number: 834 nmol/L (ref <1000)
LDL Size: 20.9 nm (ref >20.5)
LDL-C: 50 mg/dL (ref 0–99)
LP-IR Score: 65 — ABNORMAL HIGH (ref <=45)
Small LDL Particle Number: 428 nmol/L (ref <=527)
Triglycerides by NMR: 69 mg/dL (ref 0–149)

## 2016-07-26 LAB — HEPATIC FUNCTION PANEL
ALT: 15 IU/L (ref 0–44)
AST: 18 IU/L (ref 0–40)
Albumin: 4.2 g/dL (ref 3.5–4.8)
Alkaline Phosphatase: 63 IU/L (ref 39–117)
BILIRUBIN TOTAL: 0.7 mg/dL (ref 0.0–1.2)
Bilirubin, Direct: 0.2 mg/dL (ref 0.00–0.40)
Total Protein: 6.4 g/dL (ref 6.0–8.5)

## 2016-07-26 LAB — BMP8+EGFR
BUN/Creatinine Ratio: 26 — ABNORMAL HIGH (ref 10–24)
BUN: 21 mg/dL (ref 8–27)
CO2: 27 mmol/L (ref 18–29)
Calcium: 9.1 mg/dL (ref 8.6–10.2)
Chloride: 105 mmol/L (ref 96–106)
Creatinine, Ser: 0.82 mg/dL (ref 0.76–1.27)
GFR calc Af Amer: 101 mL/min/{1.73_m2} (ref >59)
GFR calc non Af Amer: 88 mL/min/{1.73_m2} (ref >59)
Glucose: 93 mg/dL (ref 65–99)
Potassium: 4.1 mmol/L (ref 3.5–5.2)
Sodium: 142 mmol/L (ref 134–144)

## 2016-07-26 LAB — PSA, TOTAL AND FREE
PSA, Free Pct: 26.7 %
PSA, Free: 0.48 ng/mL
Prostate Specific Ag, Serum: 1.8 ng/mL (ref 0.0–4.0)

## 2016-07-26 LAB — VITAMIN D 25 HYDROXY (VIT D DEFICIENCY, FRACTURES): VIT D 25 HYDROXY: 76.4 ng/mL (ref 30.0–100.0)

## 2016-08-01 ENCOUNTER — Ambulatory Visit (INDEPENDENT_AMBULATORY_CARE_PROVIDER_SITE_OTHER): Payer: Medicare Other | Admitting: Family Medicine

## 2016-08-01 ENCOUNTER — Encounter: Payer: Self-pay | Admitting: Family Medicine

## 2016-08-01 VITALS — BP 130/71 | HR 59 | Temp 97.7°F | Ht 71.5 in | Wt 211.0 lb

## 2016-08-01 DIAGNOSIS — F418 Other specified anxiety disorders: Secondary | ICD-10-CM

## 2016-08-01 DIAGNOSIS — E785 Hyperlipidemia, unspecified: Secondary | ICD-10-CM

## 2016-08-01 DIAGNOSIS — I714 Abdominal aortic aneurysm, without rupture, unspecified: Secondary | ICD-10-CM

## 2016-08-01 DIAGNOSIS — N400 Enlarged prostate without lower urinary tract symptoms: Secondary | ICD-10-CM | POA: Diagnosis not present

## 2016-08-01 DIAGNOSIS — H9313 Tinnitus, bilateral: Secondary | ICD-10-CM

## 2016-08-01 DIAGNOSIS — E559 Vitamin D deficiency, unspecified: Secondary | ICD-10-CM

## 2016-08-01 DIAGNOSIS — Z8052 Family history of malignant neoplasm of bladder: Secondary | ICD-10-CM | POA: Diagnosis not present

## 2016-08-01 DIAGNOSIS — I10 Essential (primary) hypertension: Secondary | ICD-10-CM

## 2016-08-01 DIAGNOSIS — D696 Thrombocytopenia, unspecified: Secondary | ICD-10-CM | POA: Diagnosis not present

## 2016-08-01 LAB — URINALYSIS, COMPLETE
Bilirubin, UA: NEGATIVE
Glucose, UA: NEGATIVE
Ketones, UA: NEGATIVE
Leukocytes, UA: NEGATIVE
NITRITE UA: NEGATIVE
PH UA: 5.5 (ref 5.0–7.5)
PROTEIN UA: NEGATIVE
RBC, UA: NEGATIVE
Specific Gravity, UA: 1.015 (ref 1.005–1.030)
UUROB: 0.2 mg/dL (ref 0.2–1.0)

## 2016-08-01 LAB — MICROSCOPIC EXAMINATION
BACTERIA UA: NONE SEEN
Epithelial Cells (non renal): NONE SEEN /hpf (ref 0–10)
RBC MICROSCOPIC, UA: NONE SEEN /HPF (ref 0–?)
RENAL EPITHEL UA: NONE SEEN /HPF
WBC UA: NONE SEEN /HPF (ref 0–?)

## 2016-08-01 NOTE — Addendum Note (Signed)
Addended by: Zannie Cove on: 08/01/2016 02:53 PM   Modules accepted: Orders

## 2016-08-01 NOTE — Patient Instructions (Addendum)
Medicare Annual Wellness Visit  [facility] and the medical providers at [facility] strive to bring you the best medical care.  In doing so we not only want to address your current medical conditions and concerns but also to detect new conditions early and prevent illness, disease and health-related problems.    Medicare offers a yearly Wellness Visit which allows our clinical staff to assess your need for preventative services including immunizations, lifestyle education, counseling to decrease risk of preventable diseases and screening for fall risk and other medical concerns.    This visit is provided free of charge (no copay) for all Medicare recipients. The clinical pharmacists at [facility] have begun to conduct these Wellness Visits which will also include a thorough review of all your medications.    As you primary medical provider recommend that you make an appointment for your Annual Wellness Visit if you have not done so already this year.  You may set up this appointment before you leave today or you may call back ([phone]) and schedule an appointment.  Please make sure when you call that you mention that you are scheduling your Annual Wellness Visit with the clinical pharmacist so that the appointment may be made for the proper length of time.     Continue current medications. Continue good therapeutic lifestyle changes which include good diet and exercise. Fall precautions discussed with patient. If an FOBT was given today- please return it to our front desk. If you are over 75 years old - you may need Prevnar 82 or the adult Pneumonia vaccine.  **Flu shots are available--- please call and schedule a FLU-CLINIC appointment**  After your visit with [place] today you will receive a survey in the mail or online from [employer] regarding your care with [place]. Please take a moment to fill this out. Your feedback is very  important to [place] as you can help [place] better understand your patient needs as well as improve your experience and satisfaction. WE CARE ABOUT YOU!!!  We will arrange for you to have a visit with ear nose and throat specialist, Dr. Hermina Barters because of the tinnitus and difficulty hearing. We will also schedule an abdominal ultrasound evaluation because of your history of abdominal aortic aneurysm. Continue to work on weight through exercise and diet Leave off the Imuran milligrams

## 2016-08-01 NOTE — Progress Notes (Signed)
Subjective:    Patient ID: [name] [name], male    DOB: 04-10-1940, 76 y.o.   MRN: [id number]  HPI Pt here for follow up and management of chronic medical problems which includes hypertension and hyperlipidemia. He is taking medication regularly.The patient is doing well overall. He comes in today to review his lab work and for a general physical exam. He complains of ringing in his ears. He has had recent lab work done and his PSA is stable from 1 year ago and is now still 1.8. Cholesterol numbers with advanced lipid testing have an LDL particle number that is good at 834 with an LDL C that is good and at goal at 50. Triglycerides are good at 69 and the HDL particle number are the good cholesterol is within normal limits. All liver function tests are normal. The blood sugar was good at 93 and the creatinine was good at 82 with normal electrolytes CBC had a normal white blood cell count with a hemoglobin that was 13.8 and a platelet count that was within normal limits and the 155,000. Vitamin D level was good at 76.4. The patient denies any chest pain or shortness of breath. He still sees a cardiologist on a yearly basis. He denies any trouble with swallowing heartburn indigestion nausea vomiting diarrhea or blood in the stool. He does take omeprazole regularly. He does periodically and rarely takes a half of a 15 mg meloxicam but not on a regular basis. He's passing his water without problems. He does have to get up once at nighttime. He is on CPAP and showed me his readings over the past few weeks which seemed to be less apnea bouts with using his CPAP machine regularly. He sees the pulmonologist once a year in January. He also mentioned about the ringing or tinnitus in his ears. He indicates he may have some slight problems with hearing at certain frequencies. As a follow-up to this complaint we will arrange for him to see the ear nose and throat specialist for further evaluation.    Patient Active  Problem List   Diagnosis Date Noted  . Ventral hernia without obstruction or gangrene 03/28/2016  . AAA (abdominal aortic aneurysm) without rupture ([place]) 07/20/2015  . Obstructive sleep apnea 03/17/2014  . Erectile dysfunction due to arterial insufficiency 01/12/2014  . Thrombocytopenia ([place]) 12/30/2013  . B12 deficiency 10/01/2012  . Allergic rhinitis 10/01/2012  . BPH (benign prostatic hyperplasia) 10/01/2012  . PVC's (premature ventricular contractions) 09/01/2009  . COLONIC POLYPS 04/14/2009  . Hyperlipidemia LDL goal <70 04/14/2009  . Essential hypertension 04/14/2009  . Coronary atherosclerosis 04/14/2009  . HIATAL HERNIA 04/14/2009  . HERNIATED DISC 04/14/2009   Outpatient Encounter Prescriptions as of 08/01/2016  Medication Sig  . aspirin 81 MG tablet Take 81 mg by mouth 2 (two) times daily.   . Black Pepper-Turmeric (TURMERIC COMPLEX/BLACK PEPPER PO) Take by mouth.  . busPIRone (BUSPAR) 15 MG tablet Take 1 tablet (15 mg total) by mouth 2 (two) times daily as needed.  . Cholecalciferol (VITAMIN D3) 5000 UNITS CAPS Take 1 capsule by mouth daily.  . Evolocumab (REPATHA) 140 MG/ML SOSY Inject 140 mg into the skin every 14 (fourteen) days.  . fluticasone (FLONASE) 50 MCG/ACT nasal spray USE 2 SPRAYS IN EACH NOSTRIL ONCE DAILY. (Patient taking differently: USE 2 SPRAYS IN EACH NOSTRIL ONCE DAILY. as needed)  . glucosamine-chondroitin 500-400 MG tablet Take 1 tablet by mouth 3 (three) times daily.  . meloxicam (MOBIC) 15 MG tablet  TAKE (1) TABLET DAILY AS DIRECTED.  Marland Kitchen metoprolol tartrate (LOPRESSOR) 25 MG tablet TAKE 1 TABLET IN THE MORNING AND 2 TABLETS IN THE EVENING  . Multiple Vitamin (MULTI VITAMIN MENS PO) Take 1 tablet by mouth 2 (two) times daily.  . Omega-3 Fatty Acids (SUPER OMEGA-3 PO) Take by mouth.  Marland Kitchen omeprazole (PRILOSEC) 20 MG capsule Take 20 mg by mouth daily.  . sildenafil (VIAGRA) 100 MG tablet Take 0.5-1 tablets (50-100 mg total) by mouth daily as needed for  erectile dysfunction.  . nitroGLYCERIN (NITROSTAT) 0.4 MG SL tablet Place 1 tablet (0.4 mg total) under the tongue every 5 (five) minutes as needed for chest pain.   No facility-administered encounter medications on file as of 08/01/2016.       Review of Systems  Constitutional: Negative.   HENT: Positive for tinnitus.   Eyes: Negative.   Respiratory: Negative.   Cardiovascular: Negative.   Gastrointestinal: Negative.        Hernia - Dr Watt Climes  Endocrine: Negative.   Genitourinary: Negative.   Musculoskeletal: Negative.   Skin: Negative.   Allergic/Immunologic: Negative.   Neurological: Negative.   Hematological: Negative.   Psychiatric/Behavioral: Negative.        Objective:   Physical Exam  Constitutional: He is oriented to person, place, and time. He appears well-developed and well-nourished. No distress.  The patient is pleasant and alert  HENT:  Head: Normocephalic and atraumatic.  Right Ear: External ear normal.  Left Ear: External ear normal.  Nose: Nose normal.  Mouth/Throat: Oropharynx is clear and moist. No oropharyngeal exudate.  Eyes: Conjunctivae and EOM are normal. Pupils are equal, round, and reactive to light. Right eye exhibits no discharge. Left eye exhibits no discharge. No scleral icterus.  Neck: Normal range of motion. Neck supple. No thyromegaly present.  No bruits thyromegaly or anterior cervical adenopathy  Cardiovascular: Normal rate, regular rhythm, normal heart sounds and intact distal pulses.   No murmur heard. Heart is 60/m with a regular rate and rhythm  Pulmonary/Chest: Effort normal and breath sounds normal. No respiratory distress. He has no wheezes. He has no rales. He exhibits no tenderness.  The chest is clear anteriorly and posteriorly with no axillary adenopathy  Abdominal: Soft. Bowel sounds are normal. He exhibits no mass. There is no tenderness. There is no rebound and no guarding.  No bruits masses tenderness or organ enlargement    Genitourinary: Rectum normal and penis normal.  Genitourinary Comments: The prostate is slightly enlarged without lumps or masses. There is no rectal masses. Genitalia were within normal limits and no inguinal hernias were palpable.  Musculoskeletal: Normal range of motion. He exhibits no edema.  Lymphadenopathy:    He has no cervical adenopathy.  Neurological: He is alert and oriented to person, place, and time. He has normal reflexes. No cranial nerve deficit.  Skin: Skin is warm and dry. No rash noted.  Psychiatric: He has a normal mood and affect. His behavior is normal. Judgment and thought content normal.  Nursing note and vitals reviewed.  BP 130/71 (BP Location: Left Arm)   Pulse (!) 59   Temp 97.7 F (36.5 C) (Oral)   Ht 5' 11.5" (1.816 m)   Wt 211 lb (95.7 kg)   BMI 29.02 kg/m        Assessment & Plan:  1. Essential hypertension -The blood pressure is good today he will continue with current treatment sodium avoidance and weight loss  2. Vitamin D deficiency -Continue with vitamin D  replacement  3. Benign prostatic hyperplasia, unspecified whether lower urinary tract symptoms present -The prostate remains slightly enlarged without lumps or masses and patient only has nocturia 1.  4. Hyperlipidemia LDL goal <70 -Cholesterol numbers are excellent on his PCS K-9 inhibitor therapy and he will continue with that.  5. Thrombocytopenia ([place]) -The patient's platelet count was adequate this time and not low.  6. Family history of bladder cancer -The patient has had cystoscopy in the past year or so and this was negative. - Urinalysis, Complete  7. Situational anxiety -The issues causing his anxiety had somewhat abated and he is doing well with this problem currently.  8. AAA (abdominal aortic aneurysm) without rupture ([place]) -Ultrasound of abdomen  Patient Instructions                       Medicare Annual Wellness Visit  [facility] and the medical providers at  [facility] strive to bring you the best medical care.  In doing so we not only want to address your current medical conditions and concerns but also to detect new conditions early and prevent illness, disease and health-related problems.    Medicare offers a yearly Wellness Visit which allows our clinical staff to assess your need for preventative services including immunizations, lifestyle education, counseling to decrease risk of preventable diseases and screening for fall risk and other medical concerns.    This visit is provided free of charge (no copay) for all Medicare recipients. The clinical pharmacists at [facility] have begun to conduct these Wellness Visits which will also include a thorough review of all your medications.    As you primary medical provider recommend that you make an appointment for your Annual Wellness Visit if you have not done so already this year.  You may set up this appointment before you leave today or you may call back ([phone]) and schedule an appointment.  Please make sure when you call that you mention that you are scheduling your Annual Wellness Visit with the clinical pharmacist so that the appointment may be made for the proper length of time.     Continue current medications. Continue good therapeutic lifestyle changes which include good diet and exercise. Fall precautions discussed with patient. If an FOBT was given today- please return it to our front desk. If you are over 67 years old - you may need Prevnar 63 or the adult Pneumonia vaccine.  **Flu shots are available--- please call and schedule a FLU-CLINIC appointment**  After your visit with [place] today you will receive a survey in the mail or online from [employer] regarding your care with [place]. Please take a moment to fill this out. Your feedback is very important to [place] as you can help [place] better understand your patient needs as well as improve your  experience and satisfaction. WE CARE ABOUT YOU!!!  We will arrange for you to have a visit with ear nose and throat specialist, Dr. Hermina Barters because of the tinnitus and difficulty hearing. We will also schedule an abdominal ultrasound evaluation because of your history of abdominal aortic aneurysm. Continue to work on weight through exercise and diet Leave off the Imuran milligrams   Arrie Senate MD

## 2016-08-02 ENCOUNTER — Ambulatory Visit: Payer: Medicare Other | Admitting: Family Medicine

## 2016-08-03 ENCOUNTER — Other Ambulatory Visit: Payer: Self-pay | Admitting: Family Medicine

## 2016-08-05 ENCOUNTER — Other Ambulatory Visit: Payer: Self-pay | Admitting: Family Medicine

## 2016-08-07 ENCOUNTER — Ambulatory Visit (HOSPITAL_COMMUNITY)
Admission: RE | Admit: 2016-08-07 | Discharge: 2016-08-07 | Disposition: A | Discharge status: Home / Self Care | Payer: Medicare Other | Source: Ambulatory Visit | Attending: Family Medicine | Admitting: Family Medicine

## 2016-08-07 DIAGNOSIS — I714 Abdominal aortic aneurysm, without rupture, unspecified: Secondary | ICD-10-CM

## 2016-08-08 ENCOUNTER — Telehealth: Payer: Self-pay | Admitting: Family Medicine

## 2016-08-09 NOTE — Telephone Encounter (Signed)
Pt decided to take the appointment with [facility] because it was much sooner.

## 2016-09-10 DIAGNOSIS — H903 Sensorineural hearing loss, bilateral: Secondary | ICD-10-CM | POA: Diagnosis not present

## 2016-09-10 DIAGNOSIS — H9113 Presbycusis, bilateral: Secondary | ICD-10-CM | POA: Diagnosis not present

## 2016-09-10 DIAGNOSIS — H9313 Tinnitus, bilateral: Secondary | ICD-10-CM | POA: Insufficient documentation

## 2016-10-19 ENCOUNTER — Telehealth: Payer: Self-pay | Admitting: Family Medicine

## 2016-11-15 NOTE — Telephone Encounter (Signed)
Called patient with results from hospital INR and scheduled next appointment

## 2016-11-27 ENCOUNTER — Telehealth: Payer: Self-pay | Admitting: Family Medicine

## 2016-11-27 ENCOUNTER — Other Ambulatory Visit: Payer: Self-pay | Admitting: *Deleted

## 2016-11-27 DIAGNOSIS — N400 Enlarged prostate without lower urinary tract symptoms: Secondary | ICD-10-CM

## 2016-11-27 DIAGNOSIS — Z8052 Family history of malignant neoplasm of bladder: Secondary | ICD-10-CM

## 2016-11-27 DIAGNOSIS — I10 Essential (primary) hypertension: Secondary | ICD-10-CM

## 2016-11-27 DIAGNOSIS — E559 Vitamin D deficiency, unspecified: Secondary | ICD-10-CM

## 2016-11-27 DIAGNOSIS — D696 Thrombocytopenia, unspecified: Secondary | ICD-10-CM

## 2016-11-27 DIAGNOSIS — E785 Hyperlipidemia, unspecified: Secondary | ICD-10-CM

## 2016-11-27 NOTE — Telephone Encounter (Signed)
Will order.

## 2016-11-28 ENCOUNTER — Other Ambulatory Visit: Payer: Medicare Other

## 2016-11-28 DIAGNOSIS — D696 Thrombocytopenia, unspecified: Secondary | ICD-10-CM | POA: Diagnosis not present

## 2016-11-28 DIAGNOSIS — Z8052 Family history of malignant neoplasm of bladder: Secondary | ICD-10-CM

## 2016-11-28 DIAGNOSIS — N400 Enlarged prostate without lower urinary tract symptoms: Secondary | ICD-10-CM | POA: Diagnosis not present

## 2016-11-28 DIAGNOSIS — I10 Essential (primary) hypertension: Secondary | ICD-10-CM | POA: Diagnosis not present

## 2016-11-28 DIAGNOSIS — E559 Vitamin D deficiency, unspecified: Secondary | ICD-10-CM | POA: Diagnosis not present

## 2016-11-28 DIAGNOSIS — E785 Hyperlipidemia, unspecified: Secondary | ICD-10-CM

## 2016-11-28 LAB — URINALYSIS, COMPLETE
Bilirubin, UA: NEGATIVE
Glucose, UA: NEGATIVE
Ketones, UA: NEGATIVE
LEUKOCYTES UA: NEGATIVE
Nitrite, UA: NEGATIVE
PH UA: 6.5 (ref 5.0–7.5)
Protein, UA: NEGATIVE
Specific Gravity, UA: 1.015 (ref 1.005–1.030)
Urobilinogen, Ur: 0.2 mg/dL (ref 0.2–1.0)

## 2016-11-28 LAB — MICROSCOPIC EXAMINATION
BACTERIA UA: NONE SEEN
EPITHELIAL CELLS (NON RENAL): NONE SEEN /HPF (ref 0–10)
RENAL EPITHEL UA: NONE SEEN /HPF
WBC, UA: NONE SEEN /hpf (ref 0–?)

## 2016-11-29 LAB — CBC WITH DIFFERENTIAL/PLATELET
Basophils Absolute: 0 10*3/uL (ref 0.0–0.2)
Basos: 0 %
EOS (ABSOLUTE): 0.1 10*3/uL (ref 0.0–0.4)
EOS: 2 %
HEMATOCRIT: 42.1 % (ref 37.5–51.0)
HEMOGLOBIN: 14 g/dL (ref 13.0–17.7)
IMMATURE GRANS (ABS): 0 10*3/uL (ref 0.0–0.1)
IMMATURE GRANULOCYTES: 0 %
LYMPHS: 31 %
Lymphocytes Absolute: 1.6 10*3/uL (ref 0.7–3.1)
MCH: 31.6 pg (ref 26.6–33.0)
MCHC: 33.3 g/dL (ref 31.5–35.7)
MCV: 95 fL (ref 79–97)
MONOCYTES: 7 %
MONOS ABS: 0.4 10*3/uL (ref 0.1–0.9)
NEUTROS PCT: 60 %
Neutrophils Absolute: 3.1 10*3/uL (ref 1.4–7.0)
Platelets: 140 10*3/uL — ABNORMAL LOW (ref 150–379)
RBC: 4.43 x10E6/uL (ref 4.14–5.80)
RDW: 13.7 % (ref 12.3–15.4)
WBC: 5.2 10*3/uL (ref 3.4–10.8)

## 2016-11-29 LAB — NMR, LIPOPROFILE
Cholesterol: 119 mg/dL (ref 100–199)
HDL Cholesterol by NMR: 54 mg/dL (ref >39)
HDL Particle Number: 34.8 umol/L (ref >=30.5)
LDL PARTICLE NUMBER: 841 nmol/L (ref <1000)
LDL Size: 20.8 nm (ref >20.5)
LDL-C: 53 mg/dL (ref 0–99)
LP-IR Score: 49 — ABNORMAL HIGH (ref <=45)
Small LDL Particle Number: 372 nmol/L (ref <=527)
TRIGLYCERIDES BY NMR: 59 mg/dL (ref 0–149)

## 2016-11-29 LAB — HEPATIC FUNCTION PANEL
ALBUMIN: 4.4 g/dL (ref 3.5–4.8)
ALT: 15 IU/L (ref 0–44)
AST: 22 IU/L (ref 0–40)
Alkaline Phosphatase: 63 IU/L (ref 39–117)
BILIRUBIN TOTAL: 0.5 mg/dL (ref 0.0–1.2)
BILIRUBIN, DIRECT: 0.14 mg/dL (ref 0.00–0.40)
Total Protein: 6.7 g/dL (ref 6.0–8.5)

## 2016-11-29 LAB — BMP8+EGFR
BUN/Creatinine Ratio: 30 — ABNORMAL HIGH (ref 10–24)
BUN: 24 mg/dL (ref 8–27)
CALCIUM: 9.4 mg/dL (ref 8.6–10.2)
CO2: 24 mmol/L (ref 20–29)
CREATININE: 0.8 mg/dL (ref 0.76–1.27)
Chloride: 108 mmol/L — ABNORMAL HIGH (ref 96–106)
GFR calc Af Amer: 102 mL/min/{1.73_m2} (ref >59)
GFR, EST NON AFRICAN AMERICAN: 88 mL/min/{1.73_m2} (ref >59)
GLUCOSE: 98 mg/dL (ref 65–99)
Potassium: 4.5 mmol/L (ref 3.5–5.2)
Sodium: 144 mmol/L (ref 134–144)

## 2016-11-29 LAB — VITAMIN D 25 HYDROXY (VIT D DEFICIENCY, FRACTURES): Vit D, 25-Hydroxy: 63.6 ng/mL (ref 30.0–100.0)

## 2016-12-02 ENCOUNTER — Other Ambulatory Visit: Payer: Self-pay | Admitting: Family Medicine

## 2016-12-05 ENCOUNTER — Ambulatory Visit (INDEPENDENT_AMBULATORY_CARE_PROVIDER_SITE_OTHER): Payer: Medicare Other

## 2016-12-05 ENCOUNTER — Ambulatory Visit (INDEPENDENT_AMBULATORY_CARE_PROVIDER_SITE_OTHER): Payer: Medicare Other | Admitting: Family Medicine

## 2016-12-05 ENCOUNTER — Encounter: Payer: Self-pay | Admitting: Family Medicine

## 2016-12-05 VITALS — BP 125/69 | HR 56 | Temp 97.7°F | Ht 72.0 in | Wt 209.0 lb

## 2016-12-05 DIAGNOSIS — M546 Pain in thoracic spine: Secondary | ICD-10-CM

## 2016-12-05 DIAGNOSIS — I10 Essential (primary) hypertension: Secondary | ICD-10-CM

## 2016-12-05 DIAGNOSIS — N400 Enlarged prostate without lower urinary tract symptoms: Secondary | ICD-10-CM

## 2016-12-05 DIAGNOSIS — Z23 Encounter for immunization: Secondary | ICD-10-CM | POA: Diagnosis not present

## 2016-12-05 DIAGNOSIS — D696 Thrombocytopenia, unspecified: Secondary | ICD-10-CM

## 2016-12-05 DIAGNOSIS — Z8052 Family history of malignant neoplasm of bladder: Secondary | ICD-10-CM

## 2016-12-05 DIAGNOSIS — E785 Hyperlipidemia, unspecified: Secondary | ICD-10-CM

## 2016-12-05 DIAGNOSIS — E559 Vitamin D deficiency, unspecified: Secondary | ICD-10-CM | POA: Diagnosis not present

## 2016-12-05 NOTE — Patient Instructions (Addendum)
Medicare Annual Wellness Visit  [facility] and the medical providers at [facility] strive to bring you the best medical care.  In doing so we not only want to address your current medical conditions and concerns but also to detect new conditions early and prevent illness, disease and health-related problems.    Medicare offers a yearly Wellness Visit which allows our clinical staff to assess your need for preventative services including immunizations, lifestyle education, counseling to decrease risk of preventable diseases and screening for fall risk and other medical concerns.    This visit is provided free of charge (no copay) for all Medicare recipients. The clinical pharmacists at [facility] have begun to conduct these Wellness Visits which will also include a thorough review of all your medications.    As you primary medical provider recommend that you make an appointment for your Annual Wellness Visit if you have not done so already this year.  You may set up this appointment before you leave today or you may call back ([phone]) and schedule an appointment.  Please make sure when you call that you mention that you are scheduling your Annual Wellness Visit with the clinical pharmacist so that the appointment may be made for the proper length of time.     Continue current medications. Continue good therapeutic lifestyle changes which include good diet and exercise. Fall precautions discussed with patient. If an FOBT was given today- please return it to our front desk. If you are over 65 years old - you may need Prevnar 22 or the adult Pneumonia vaccine.  **Flu shots are available--- please call and schedule a FLU-CLINIC appointment**  After your visit with [place] today you will receive a survey in the mail or online from [employer] regarding your care with [place]. Please take a moment to fill this out. Your feedback is very  important to [place] as you can help [place] better understand your patient needs as well as improve your experience and satisfaction. WE CARE ABOUT YOU!!!  Follow-up with cardiology as planned Follow-up with ophthalmology as planned Wait about 4 weeks and come back and get your second shingles shot or sometime in November. Discussed with the cardiologist the need for baby aspirin but until that visit continue to take the baby aspirin as he has directed We will call you with the results of the chest x-ray results as soon as that is obtained Use Polysporin ointment on the lip lesion and if it does not clear up completely get back in touch with [place] and we will make a referral to the dermatologist

## 2016-12-05 NOTE — Addendum Note (Signed)
Addended by: Zannie Cove on: 12/05/2016 02:54 PM   Modules accepted: Orders

## 2016-12-05 NOTE — Progress Notes (Signed)
Subjective:    Patient ID: [name] [name], male    DOB: 22-Nov-1940, 76 y.o.   MRN: [id number]  HPI Pt here for follow up and management of chronic medical problems which includes hypertension and hyperlipidemia. He is taking medication regularly.The patient is doing well overall, he is concerned with a lesion on his lip. He is due to return an FOBT and has had lab work done which we will review with him during the visit today. He'll get his flu shot today and is due to his shingle's vaccine but we'll wait on this one. The urinalysis was done and was within normal limits. The blood sugar was good the creatinine was good at 0.8 and the electrolytes were all good including the potassium except the chloride was slightly elevated. The CBC had a normal white blood cell count with a good hemoglobin at 14.0. The platelet count was slightly decreased at 140,000. All liver function tests were normal and the vitamin D level was excellent at 63.6. Cholesterol numbers with advanced lipid testing were excellent with an LDL C being 53. The patient has had bypass surgery. He is on a PCS K-9 inhibitor. The patient sees the cardiologist on his yearly visit this month. He is doing well. He is had no chest pain or increased shortness of breath. As long as he takes his Prilosec before breakfast every morning he does not have any heartburn or indigestion. He denies any nausea vomiting diarrhea or blood in the stool. There is a positive family history of colon polyps but no colon cancer. The patient did have a colonoscopy in the spring of 2017 and the patient thinks he will need another one in 5 years. Patient is passing his water without problems. He got his eye exam in February of this year and was told he has some early cataracts.     Patient Active Problem List   Diagnosis Date Noted  . Ventral hernia without obstruction or gangrene 03/28/2016  . AAA (abdominal aortic aneurysm) without rupture ([place]) 07/20/2015  .  Obstructive sleep apnea 03/17/2014  . Erectile dysfunction due to arterial insufficiency 01/12/2014  . Thrombocytopenia ([place]) 12/30/2013  . Allergic rhinitis 10/01/2012  . BPH (benign prostatic hyperplasia) 10/01/2012  . PVC's (premature ventricular contractions) 09/01/2009  . COLONIC POLYPS 04/14/2009  . Hyperlipidemia LDL goal <70 04/14/2009  . Essential hypertension 04/14/2009  . Coronary atherosclerosis 04/14/2009  . HIATAL HERNIA 04/14/2009  . HERNIATED DISC 04/14/2009   Outpatient Encounter Prescriptions as of 12/05/2016  Medication Sig  . aspirin 81 MG tablet Take 81 mg by mouth 2 (two) times daily.   . Black Pepper-Turmeric (TURMERIC COMPLEX/BLACK PEPPER PO) Take by mouth.  . busPIRone (BUSPAR) 15 MG tablet Take 1 tablet (15 mg total) by mouth 2 (two) times daily as needed.  . Cholecalciferol (VITAMIN D3) 5000 UNITS CAPS Take 1 capsule by mouth daily.  . Evolocumab (REPATHA) 140 MG/ML SOSY Inject 140 mg into the skin every 14 (fourteen) days.  . fluticasone (FLONASE) 50 MCG/ACT nasal spray USE 2 SPRAYS IN EACH NOSTRIL ONCE DAILY.  Marland Kitchen glucosamine-chondroitin 500-400 MG tablet Take 1 tablet by mouth 3 (three) times daily.  . meloxicam (MOBIC) 15 MG tablet TAKE (1) TABLET DAILY AS DIRECTED.  Marland Kitchen metoprolol tartrate (LOPRESSOR) 25 MG tablet TAKE 1 TABLET IN THE MORNING AND 2 TABLETS IN THE EVENING  . Multiple Vitamin (MULTI VITAMIN MENS PO) Take 1 tablet by mouth 2 (two) times daily.  . Omega-3 Fatty Acids (SUPER OMEGA-3  PO) Take by mouth.  Marland Kitchen omeprazole (PRILOSEC) 20 MG capsule Take 20 mg by mouth daily.  . sildenafil (VIAGRA) 100 MG tablet Take 0.5-1 tablets (50-100 mg total) by mouth daily as needed for erectile dysfunction.  . nitroGLYCERIN (NITROSTAT) 0.4 MG SL tablet Place 1 tablet (0.4 mg total) under the tongue every 5 (five) minutes as needed for chest pain.   No facility-administered encounter medications on file as of 12/05/2016.      Review of Systems  Constitutional:  Negative.   HENT: Negative.   Eyes: Negative.   Respiratory: Negative.   Cardiovascular: Negative.   Gastrointestinal: Negative.   Endocrine: Negative.   Genitourinary: Negative.   Musculoskeletal: Negative.   Skin: Negative.        Lesion - right side lower lip  Allergic/Immunologic: Negative.   Neurological: Negative.   Hematological: Negative.   Psychiatric/Behavioral: Negative.        Objective:   Physical Exam  Constitutional: He is oriented to person, place, and time. He appears well-developed and well-nourished. No distress.  The patient is pleasant and alert  HENT:  Head: Normocephalic and atraumatic.  Right Ear: External ear normal.  Left Ear: External ear normal.  Nose: Nose normal.  Mouth/Throat: Oropharynx is clear and moist. No oropharyngeal exudate.  Eyes: Pupils are equal, round, and reactive to light. Conjunctivae and EOM are normal. Right eye exhibits no discharge. Left eye exhibits no discharge. No scleral icterus.  On visits to the eye doctor he's been told he has some early cataracts  Neck: Normal range of motion. Neck supple. No thyromegaly present.  No bruits thyromegaly or anterior cervical adenopathy  Cardiovascular: Normal rate, regular rhythm, normal heart sounds and intact distal pulses.   No murmur heard. Heart is regular at 60/m  Pulmonary/Chest: Effort normal and breath sounds normal. No respiratory distress. He has no wheezes. He has no rales. He exhibits no tenderness.  Clear anteriorly and posteriorly and no axillary adenopathy  Abdominal: Soft. Bowel sounds are normal. He exhibits no mass. There is no tenderness. There is no rebound and no guarding.  No abdominal tenderness masses bruits or inguinal adenopathy  Musculoskeletal: Normal range of motion. He exhibits no edema.  Good mobility overall and good breath sounds anteriorly and posteriorly  Lymphadenopathy:    He has no cervical adenopathy.  Neurological: He is alert and oriented to  person, place, and time. He has normal reflexes. No cranial nerve deficit.  Skin: Skin is warm and dry. No rash noted.  Psychiatric: He has a normal mood and affect. His behavior is normal. Judgment and thought content normal.  Nursing note and vitals reviewed.  BP 125/69 (BP Location: Left Arm)   Pulse (!) 56   Temp 97.7 F (36.5 C) (Oral)   Ht 6' (1.829 m)   Wt 209 lb (94.8 kg)   BMI 28.35 kg/m         Assessment & Plan:  1. Essential hypertension -The blood pressure is good today the patient will continue with current treatment  2. Vitamin D deficiency -Continue with vitamin D replacement  3. Hyperlipidemia LDL goal <70 -Cholesterol numbers are excellent with his PCS K-9 inhibitor therapy and he will continue with this  4. Thrombocytopenia (HCC) -The platelet count remains slightly decreased but this is stable over time.  5. Family history of bladder cancer -The recent urinalysis was within normal limits  6. Benign prostatic hyperplasia, unspecified whether lower urinary tract symptoms present -No complaints with voiding  7.  Midline thoracic back pain, unspecified chronicity -Chest x-ray today  No orders of the defined types were placed in this encounter.  Patient Instructions                       Medicare Annual Wellness Visit  [facility] and the medical providers at [facility] strive to bring you the best medical care.  In doing so we not only want to address your current medical conditions and concerns but also to detect new conditions early and prevent illness, disease and health-related problems.    Medicare offers a yearly Wellness Visit which allows our clinical staff to assess your need for preventative services including immunizations, lifestyle education, counseling to decrease risk of preventable diseases and screening for fall risk and other medical concerns.    This visit is provided free of charge (no copay) for all Medicare  recipients. The clinical pharmacists at [facility] have begun to conduct these Wellness Visits which will also include a thorough review of all your medications.    As you primary medical provider recommend that you make an appointment for your Annual Wellness Visit if you have not done so already this year.  You may set up this appointment before you leave today or you may call back ([phone]) and schedule an appointment.  Please make sure when you call that you mention that you are scheduling your Annual Wellness Visit with the clinical pharmacist so that the appointment may be made for the proper length of time.     Continue current medications. Continue good therapeutic lifestyle changes which include good diet and exercise. Fall precautions discussed with patient. If an FOBT was given today- please return it to our front desk. If you are over 85 years old - you may need Prevnar 73 or the adult Pneumonia vaccine.  **Flu shots are available--- please call and schedule a FLU-CLINIC appointment**  After your visit with [place] today you will receive a survey in the mail or online from [employer] regarding your care with [place]. Please take a moment to fill this out. Your feedback is very important to [place] as you can help [place] better understand your patient needs as well as improve your experience and satisfaction. WE CARE ABOUT YOU!!!  Follow-up with cardiology as planned Follow-up with ophthalmology as planned Wait about 4 weeks and come back and get your second shingles shot or sometime in November. Discussed with the cardiologist the need for baby aspirin but until that visit continue to take the baby aspirin as he has directed We will call you with the results of the chest x-ray results as soon as that is obtained  Arrie Senate MD

## 2016-12-10 NOTE — Progress Notes (Signed)
.   HPI The patient presents for evaluation of CAD/CABG. Since I last saw him he has done well.  The patient denies any neck or arm discomfort. There has been no new shortness of breath, PND or orthopnea. There have been no reported palpitations, presyncope or syncope.  He is exercising but not as much as he was.  He does golf routinely.   He does report some shooting chest pains sporadically.     Allergies  Allergen Reactions  . Levofloxacin     Caused C- Diff  . Crestor [Rosuvastatin] Other (See Comments)    Liver problems  . Vytorin [Ezetimibe-Simvastatin] Other (See Comments)    Liver problems    Current Outpatient Prescriptions  Medication Sig Dispense Refill  . aspirin 81 MG tablet Take 81 mg by mouth 2 (two) times daily.     . Black Pepper-Turmeric (TURMERIC COMPLEX/BLACK PEPPER PO) Take by mouth.    . busPIRone (BUSPAR) 15 MG tablet Take 1 tablet (15 mg total) by mouth 2 (two) times daily as needed. 60 tablet 2  . Cholecalciferol (VITAMIN D3) 5000 UNITS CAPS Take 1 capsule by mouth daily.    . Evolocumab (REPATHA) 140 MG/ML SOSY Inject 140 mg into the skin every 14 (fourteen) days. 6 Syringe 3  . fluticasone (FLONASE) 50 MCG/ACT nasal spray USE 2 SPRAYS IN EACH NOSTRIL ONCE DAILY. 16 g 0  . glucosamine-chondroitin 500-400 MG tablet Take 1 tablet by mouth 3 (three) times daily.    . meloxicam (MOBIC) 15 MG tablet TAKE (1) TABLET DAILY AS DIRECTED. 30 tablet 2  . metoprolol tartrate (LOPRESSOR) 25 MG tablet TAKE 1 TABLET IN THE MORNING AND 2 TABLETS IN THE EVENING 270 tablet 3  . Multiple Vitamin (MULTI VITAMIN MENS PO) Take 1 tablet by mouth 2 (two) times daily.    . Omega-3 Fatty Acids (SUPER OMEGA-3 PO) Take by mouth.    Marland Kitchen omeprazole (PRILOSEC) 20 MG capsule Take 20 mg by mouth daily.    . sildenafil (VIAGRA) 100 MG tablet Take 0.5-1 tablets (50-100 mg total) by mouth daily as needed for erectile dysfunction. 6 tablet 11  . nitroGLYCERIN (NITROSTAT) 0.4 MG SL tablet Place 1  tablet (0.4 mg total) under the tongue every 5 (five) minutes as needed for chest pain. 25 tablet prn   No current facility-administered medications for this visit.     Past Medical History:  Diagnosis Date  . CAD (coronary artery disease)   . Colon polyps   . Dyslipidemia   . Herniated disc   . Hiatal hernia   . HTN (hypertension)   . Hyperlipidemia   . Kidney stones     Past Surgical History:  Procedure Laterality Date  . CORONARY ARTERY BYPASS GRAFT  2006   LIMA to LAD, SVG to RCA, SVG to circumflex, SVG to diagonal   . mole removed  2003  . right knee surgery  2008  . TONSILLECTOMY      ROS:    As stated in the HPI and negative for all other systems.  PHYSICAL EXAM BP 140/70   Pulse 62   Ht 6\' 1"  (1.854 m)   Wt 209 lb (94.8 kg)   SpO2 98%   BMI 27.57 kg/m   GENERAL:  Well appearing NECK:  No jugular venous distention, waveform within normal limits, carotid upstroke brisk and symmetric, no bruits, no thyromegaly LUNGS:  Clear to auscultation bilaterally CHEST:  Well healed sternotomy scar. HEART:  PMI not displaced or sustained,S1 and  S2 within normal limits, no S3, no S4, no clicks, no rubs, no murmurs ABD:  Flat, positive bowel sounds normal in frequency in pitch, no bruits, no rebound, no guarding, no midline pulsatile mass, no hepatomegaly, no splenomegaly EXT:  2 plus pulses throughout, no edema, no cyanosis no clubbing   EKG:  Sinus rhythm, rate 63, axis within normal limits, intervals within normal limits, no acute ST-T wave changes.  ASSESSMENT AND PLAN   AAA:  3.2 cm this year.  I will follow in two years.    CAD:  He has had some mild shooting chest pain.  I will bring the patient back for a POET (Plain Old Exercise Test). This will allow me to screen for obstructive coronary disease, risk stratify and very importantly provide a prescription for exercise.  HTN:  The blood pressure is controlled.  He will continue meds as listed.   DYSLIPIDEMIA:    His LDL was 53 and HDL 54.  SLEEP APNEA:  He is having this treated with CPAP.

## 2016-12-11 ENCOUNTER — Ambulatory Visit (INDEPENDENT_AMBULATORY_CARE_PROVIDER_SITE_OTHER): Payer: Medicare Other | Admitting: Cardiology

## 2016-12-11 ENCOUNTER — Encounter: Payer: Self-pay | Admitting: Cardiology

## 2016-12-11 VITALS — BP 140/70 | HR 62 | Ht 73.0 in | Wt 209.0 lb

## 2016-12-11 DIAGNOSIS — I714 Abdominal aortic aneurysm, without rupture, unspecified: Secondary | ICD-10-CM

## 2016-12-11 DIAGNOSIS — I2583 Coronary atherosclerosis due to lipid rich plaque: Secondary | ICD-10-CM

## 2016-12-11 DIAGNOSIS — I10 Essential (primary) hypertension: Secondary | ICD-10-CM | POA: Diagnosis not present

## 2016-12-11 DIAGNOSIS — I2510 Atherosclerotic heart disease of native coronary artery without angina pectoris: Secondary | ICD-10-CM | POA: Diagnosis not present

## 2016-12-11 DIAGNOSIS — E785 Hyperlipidemia, unspecified: Secondary | ICD-10-CM | POA: Diagnosis not present

## 2016-12-11 NOTE — Patient Instructions (Signed)
Medication Instructions:  Continue current medications  If you need a refill on your cardiac medications before your next appointment, please call your pharmacy.  Labwork: None Ordered   Testing/Procedures: Your physician has requested that you have an exercise tolerance test. For further information please visit [email or web address]. Please also follow instruction sheet, as given.   Follow-Up: Your physician wants you to follow-up in: 1 Year. You should receive a reminder letter in the mail two months in advance. If you do not receive a letter, please call our office [phone].       Thank you for choosing [facility]!!       

## 2016-12-12 ENCOUNTER — Other Ambulatory Visit: Payer: Medicare Other

## 2016-12-12 DIAGNOSIS — Z1212 Encounter for screening for malignant neoplasm of rectum: Secondary | ICD-10-CM

## 2016-12-13 DIAGNOSIS — Z1212 Encounter for screening for malignant neoplasm of rectum: Secondary | ICD-10-CM | POA: Diagnosis not present

## 2016-12-14 ENCOUNTER — Encounter: Payer: Self-pay | Admitting: Cardiology

## 2016-12-18 LAB — FECAL OCCULT BLOOD, IMMUNOCHEMICAL: FECAL OCCULT BLD: NEGATIVE

## 2016-12-18 NOTE — Progress Notes (Unsigned)
Note released to mychart.

## 2016-12-24 ENCOUNTER — Telehealth: Payer: Self-pay | Admitting: Family Medicine

## 2016-12-24 ENCOUNTER — Telehealth: Payer: Self-pay | Admitting: *Deleted

## 2016-12-24 MED ORDER — EVOLOCUMAB 140 MG/ML ~~LOC~~ SOSY
140.0000 mg | PREFILLED_SYRINGE | SUBCUTANEOUS | 3 refills | Status: DC
Start: 2016-12-24 — End: 2018-02-09

## 2016-12-24 NOTE — Telephone Encounter (Signed)
Pt inquiring about renewal for Repatha Pt informed RX will be sent to pharmacy Will work on PA and let pt know

## 2016-12-24 NOTE — Telephone Encounter (Signed)
Aware of lab

## 2016-12-25 ENCOUNTER — Telehealth: Payer: Self-pay | Admitting: Family Medicine

## 2016-12-25 NOTE — Telephone Encounter (Signed)
Has Ed got his answer on the grant? This message has been sitting out there for a month. Just want to delete the message if completed.

## 2016-12-25 NOTE — Telephone Encounter (Signed)
Spoke with pt regarding Tdap Pt cut thumb with clean unused kitchen knife Gave instructions on bleeding, infection Pt will call for appt if sxs worsen

## 2016-12-26 ENCOUNTER — Telehealth (HOSPITAL_COMMUNITY): Payer: Self-pay

## 2016-12-26 NOTE — Telephone Encounter (Signed)
Encounter complete. 

## 2016-12-31 ENCOUNTER — Ambulatory Visit (HOSPITAL_COMMUNITY)
Admission: RE | Admit: 2016-12-31 | Discharge: 2016-12-31 | Disposition: A | Discharge status: Home / Self Care | Payer: Medicare Other | Source: Ambulatory Visit | Attending: Cardiovascular Disease | Admitting: Cardiovascular Disease

## 2016-12-31 DIAGNOSIS — I2510 Atherosclerotic heart disease of native coronary artery without angina pectoris: Secondary | ICD-10-CM | POA: Insufficient documentation

## 2016-12-31 DIAGNOSIS — I2583 Coronary atherosclerosis due to lipid rich plaque: Secondary | ICD-10-CM | POA: Insufficient documentation

## 2016-12-31 LAB — EXERCISE TOLERANCE TEST
CHL CUP RESTING HR STRESS: 65 {beats}/min
CHL RATE OF PERCEIVED EXERTION: 17
CSEPEW: 9 METS
CSEPPHR: 150 {beats}/min
Exercise duration (min): 7 min
Exercise duration (sec): 19 s
MPHR: 146 {beats}/min
Percent HR: 102 %

## 2017-01-02 DIAGNOSIS — N5201 Erectile dysfunction due to arterial insufficiency: Secondary | ICD-10-CM | POA: Diagnosis not present

## 2017-01-02 DIAGNOSIS — Z87442 Personal history of urinary calculi: Secondary | ICD-10-CM | POA: Diagnosis not present

## 2017-01-09 ENCOUNTER — Ambulatory Visit (INDEPENDENT_AMBULATORY_CARE_PROVIDER_SITE_OTHER): Payer: Medicare Other | Admitting: *Deleted

## 2017-01-09 DIAGNOSIS — Z23 Encounter for immunization: Secondary | ICD-10-CM

## 2017-01-09 NOTE — Progress Notes (Signed)
Pt given Shingrix # 2 IM left deltoid and tolerated well.

## 2017-01-16 ENCOUNTER — Telehealth: Payer: Self-pay | Admitting: Family Medicine

## 2017-01-16 NOTE — Telephone Encounter (Signed)
Pt asking questions regarding Repatha coverage Questions answered

## 2017-03-05 ENCOUNTER — Telehealth: Payer: Self-pay | Admitting: Internal Medicine

## 2017-03-05 DIAGNOSIS — G4733 Obstructive sleep apnea (adult) (pediatric): Secondary | ICD-10-CM

## 2017-03-05 NOTE — Telephone Encounter (Signed)
lmtcb x1 for pt. 

## 2017-03-06 NOTE — Telephone Encounter (Signed)
If you mean he needs Rx sent to DME for CPAP supplies, hoses, filters etc, yes that's ok.

## 2017-03-06 NOTE — Telephone Encounter (Signed)
Called and spoke with pt. Pt states [facility] will be sending OV Rx for cpap supplies.  Pt has pending apt with CY on 04/01/16. I have looked on KW's desk and did not see Rx.   CY please advise if okay to order supplies via epic. Thanks.

## 2017-03-06 NOTE — Telephone Encounter (Signed)
Spoke with pt and advised order for CPAP supplies sent to [facility]. Nothing further is needed.

## 2017-03-10 ENCOUNTER — Other Ambulatory Visit: Payer: Self-pay | Admitting: Cardiology

## 2017-03-10 ENCOUNTER — Other Ambulatory Visit: Payer: Self-pay | Admitting: Family Medicine

## 2017-03-12 ENCOUNTER — Telehealth: Payer: Self-pay | Admitting: Family Medicine

## 2017-03-13 NOTE — Telephone Encounter (Signed)
Do you know anything about this  I have not seen a prior auth

## 2017-03-17 NOTE — Telephone Encounter (Signed)
Prior authorization approval received. Letter was mailed to patient and our office.

## 2017-03-18 ENCOUNTER — Encounter: Payer: Self-pay | Admitting: Family Medicine

## 2017-03-18 ENCOUNTER — Ambulatory Visit (INDEPENDENT_AMBULATORY_CARE_PROVIDER_SITE_OTHER): Payer: Medicare Other | Admitting: Family Medicine

## 2017-03-18 VITALS — BP 127/69 | HR 64 | Temp 98.0°F | Ht 73.0 in | Wt 215.0 lb

## 2017-03-18 DIAGNOSIS — L209 Atopic dermatitis, unspecified: Secondary | ICD-10-CM

## 2017-03-18 DIAGNOSIS — L308 Other specified dermatitis: Secondary | ICD-10-CM

## 2017-03-18 MED ORDER — METHYLPREDNISOLONE ACETATE 80 MG/ML IJ SUSP
60.0000 mg | Freq: Once | INTRAMUSCULAR | Status: AC
  Administered 2017-03-18: 60 mg via INTRAMUSCULAR

## 2017-03-18 MED ORDER — PREDNISONE 10 MG PO TABS: ORAL_TABLET | ORAL | 0 refills | Status: DC

## 2017-03-18 NOTE — Patient Instructions (Signed)
Use milder soap like Dove or [place] Green Dial or [employer] Avoid scented soaps fabric softeners and detergents All dermatology approved is a good detergent Continue with humidification and keep the house as cool as possible

## 2017-03-18 NOTE — Progress Notes (Signed)
Subjective:    Patient ID: [name] [name], male    DOB: [birth year], 77 y.o.   MRN: [id number]  HPI Patient here today for dry skin on face that was first noticed around Christmas time.  Patient is doing well overall.  Incidentally, it is noted that his Repatha was just okayed by [employer].     Patient Active Problem List   Diagnosis Date Noted  . Ventral hernia without obstruction or gangrene 03/28/2016  . AAA (abdominal aortic aneurysm) without rupture ([place]) 07/20/2015  . Obstructive sleep apnea 03/17/2014  . Erectile dysfunction due to arterial insufficiency 01/12/2014  . Thrombocytopenia ([place]) 12/30/2013  . Allergic rhinitis 10/01/2012  . BPH (benign prostatic hyperplasia) 10/01/2012  . PVC's (premature ventricular contractions) 09/01/2009  . COLONIC POLYPS 04/14/2009  . Hyperlipidemia LDL goal <70 04/14/2009  . Essential hypertension 04/14/2009  . Coronary atherosclerosis 04/14/2009  . HIATAL HERNIA 04/14/2009  . HERNIATED DISC 04/14/2009   Outpatient Encounter Medications as of 03/18/2017  Medication Sig  . aspirin 81 MG tablet Take 81 mg by mouth 2 (two) times daily.   . Black Pepper-Turmeric (TURMERIC COMPLEX/BLACK PEPPER PO) Take by mouth.  . busPIRone (BUSPAR) 15 MG tablet Take 1 tablet (15 mg total) by mouth 2 (two) times daily as needed.  . Cholecalciferol (VITAMIN D3) 5000 UNITS CAPS Take 1 capsule by mouth daily.  . Evolocumab (REPATHA) 140 MG/ML SOSY Inject 140 mg into the skin every 14 (fourteen) days.  . fluticasone (FLONASE) 50 MCG/ACT nasal spray USE 2 SPRAYS IN EACH NOSTRIL ONCE DAILY.  Marland Kitchen glucosamine-chondroitin 500-400 MG tablet Take 1 tablet by mouth 3 (three) times daily.  . meloxicam (MOBIC) 15 MG tablet TAKE (1) TABLET DAILY AS DIRECTED.  Marland Kitchen metoprolol tartrate (LOPRESSOR) 25 MG tablet TAKE 1 TABLET IN THE MORNING AND 2 TABLETS IN THE EVENING  . Multiple Vitamin (MULTI VITAMIN MENS PO) Take 1 tablet by mouth 2 (two) times daily.  .  Omega-3 Fatty Acids (SUPER OMEGA-3 PO) Take by mouth.  Marland Kitchen omeprazole (PRILOSEC) 20 MG capsule Take 20 mg by mouth daily.  . sildenafil (VIAGRA) 100 MG tablet Take 0.5-1 tablets (50-100 mg total) by mouth daily as needed for erectile dysfunction.  . nitroGLYCERIN (NITROSTAT) 0.4 MG SL tablet Place 1 tablet (0.4 mg total) under the tongue every 5 (five) minutes as needed for chest pain.   No facility-administered encounter medications on file as of 03/18/2017.       Review of Systems  Constitutional: Negative.   HENT: Negative.   Eyes: Negative.   Respiratory: Negative.   Cardiovascular: Negative.   Gastrointestinal: Negative.   Endocrine: Negative.   Genitourinary: Negative.   Musculoskeletal: Negative.   Skin: Positive for color change (red, dry skin - face ).  Allergic/Immunologic: Negative.   Neurological: Negative.   Hematological: Negative.   Psychiatric/Behavioral: Negative.        Objective:   Physical Exam  Constitutional: He is oriented to person, place, and time. He appears well-developed and well-nourished. No distress.  The patient is pleasant and concerned about this new onset of facial dermatitis  HENT:  Head: Normocephalic.  Eyes: Conjunctivae and EOM are normal. Pupils are equal, round, and reactive to light. Right eye exhibits no discharge. Left eye exhibits no discharge. No scleral icterus.  Neck: Normal range of motion.  Musculoskeletal: Normal range of motion.  Neurological: He is alert and oriented to person, place, and time.  Skin: Skin is warm and dry. Rash noted. There is  erythema.  Redness and dry skin along the right mandible and the folded areas of each corner of the mouth and at the lateral margin of each orbit.  The skin is red and scaling in these areas.  The trunk was checked and the neck was checked and were noticed dermatitis findings present there.  Psychiatric: He has a normal mood and affect. His behavior is normal. Judgment and thought content  normal.  Nursing note and vitals reviewed.  BP 127/69 (BP Location: Left Arm)   Pulse 64   Temp 98 F (36.7 C) (Oral)   Ht 6\' 1"  (1.854 m)   Wt 215 lb (97.5 kg)   BMI 28.37 kg/m        Assessment & Plan:  1. Atopic dermatitis, unspecified type -Depo-Medrol and steroid taper -Avoid soaps fabric softeners and detergents that are scented  2. Other eczema -As above  No orders of the defined types were placed in this encounter.  Patient Instructions  Use milder soap like Dove or [place] Green Dial or [employer] Avoid scented soaps fabric softeners and detergents All dermatology approved is a good detergent Continue with humidification and keep the house as cool as possible  Arrie Senate MD

## 2017-03-19 NOTE — Telephone Encounter (Signed)
Patient aware.

## 2017-03-31 ENCOUNTER — Encounter: Payer: Self-pay | Admitting: Internal Medicine

## 2017-04-01 ENCOUNTER — Ambulatory Visit (INDEPENDENT_AMBULATORY_CARE_PROVIDER_SITE_OTHER): Payer: Medicare Other | Admitting: Internal Medicine

## 2017-04-01 ENCOUNTER — Encounter: Payer: Self-pay | Admitting: Internal Medicine

## 2017-04-01 DIAGNOSIS — G4733 Obstructive sleep apnea (adult) (pediatric): Secondary | ICD-10-CM | POA: Diagnosis not present

## 2017-04-01 NOTE — Progress Notes (Signed)
HPI male never smoker followed for OSA, complicated by CAD/CABG, HBP, hiatal hernia Unattended Home Sleep Test 05/09/14- AHI 24.9/ hr, desat to 85%, body weight 208 pounds Unattended Home Sleep Test 08/02/15-AHI 11.3/hour, desaturation to 76%, body weight 212.5 pounds -----------------------------------------------------------  03/28/2016-77 year old male never smoker followed for OSA, complicated by CAD/CABG, HBP, hiatal hernia CPAP auto 5-20/Advanced FOLLOWS FOR:DME: [facility]. Pt states he has lost his CPAP machine back in [place] not used since then. Was lost on airline and unable to get new one under insurance and was told he could get Rx from [place] to purchase from DME or online. DL prior to losing machine is attached.  He has been very comfortable with CPAP indefinitely slept better with it. Has been trying to deal with his insurance to get lost machine replaced but is going to have to self-pay this time. Download available confirming excellent compliance and control. Pressure usually between 6 and 10.  04/01/17- 77 year old male never smoker followed for OSA, complicated by CAD/CABG, HBP, hiatal hernia CPAP auto 5-12/Advanced -----OSA; DME [facility]. Pt wears CPAP nightly and DL attached. No new supplies needed at this time.  Download 93% compliance, AHI 1.8/hour.  He definitely sleeps better with CPAP.  Follows results with an app on his phone.  Now using a So Clean machine.  ROS-see HPI     + = pos Constitutional:   No-   weight loss, night sweats, fevers, chills, fatigue, lassitude. HEENT:   No-  headaches, difficulty swallowing, tooth/dental problems, sore throat,       No-  sneezing, itching, ear ache, nasal congestion, post nasal drip,  CV:  No-   chest pain, orthopnea, PND, swelling in lower extremities, anasarca,                                                       dizziness, palpitations Resp: No-   shortness of breath with exertion or at rest.              No-   productive cough,  No  non-productive cough,  No- coughing up of blood.              No-   change in color of mucus.  No- wheezing.   Skin: No-   rash or lesions. GI:  No-   heartburn, indigestion, abdominal pain, nausea, vomiting, diarrhea,                 change in bowel habits, loss of appetite GU: No-   dysuria, change in color of urine, no urgency or frequency.  No- flank pain. MS:  + joint pain or swelling.  No- decreased range of motion.  No- back pain. Neuro-     nothing unusual Psych:  No- change in mood or affect. No depression or anxiety.  No memory loss.  OBJ- Physical Exam   + = pos General- Alert, Oriented, Affect-appropriate, Distress- none acute, tall, not obese Skin- rash-none, lesions- none, excoriation- none Lymphadenopathy- none Head- atraumatic            Eyes- Gross vision intact, PERRLA, conjunctivae and secretions clear            Ears- Hearing, canals-normal            Nose- Clear, no-Septal dev, mucus, polyps, erosion, perforation  Throat- Mallampati II-III , mucosa clear , drainage- none, tonsils- atrophic Neck- flexible , trachea midline, no stridor , thyroid nl, carotid no bruit Chest - symmetrical excursion , unlabored           Heart/CV- RRR , no murmur , no gallop  , no rub, nl s1 s2                           - JVD- none , edema- none, stasis changes- none, varices- none           Lung- clear to P&A, wheeze- none, cough- none , dullness-none, rub- none           Chest wall-  Abd- tender-no, distended-no, bowel sounds-present, HSM- no Br/ Gen/ Rectal- Not done, not indicated Extrem- cyanosis- none, clubbing, none, atrophy- none, strength- nl Neuro- grossly intact to observation. Relaxed and conversational. He does not seem tired.

## 2017-04-01 NOTE — Patient Instructions (Signed)
We can continue CPAP auto 5-12, mask of choice, humidifier, supplies, AirView  Please call if we can help 

## 2017-04-11 ENCOUNTER — Encounter: Payer: Self-pay | Admitting: Family

## 2017-04-11 ENCOUNTER — Ambulatory Visit (INDEPENDENT_AMBULATORY_CARE_PROVIDER_SITE_OTHER): Payer: Medicare Other | Admitting: Family

## 2017-04-11 VITALS — BP 136/75 | HR 65 | Temp 97.7°F | Ht 73.0 in | Wt 212.0 lb

## 2017-04-11 DIAGNOSIS — R6889 Other general symptoms and signs: Secondary | ICD-10-CM

## 2017-04-11 DIAGNOSIS — J069 Acute upper respiratory infection, unspecified: Secondary | ICD-10-CM

## 2017-04-11 LAB — VERITOR FLU A/B WAIVED
Influenza A: NEGATIVE
Influenza B: NEGATIVE

## 2017-04-11 LAB — RAPID STREP SCREEN (MED CTR MEBANE ONLY): Strep Gp A Ag, IA W/Reflex: NEGATIVE

## 2017-04-11 LAB — CULTURE, GROUP A STREP

## 2017-04-11 MED ORDER — AZITHROMYCIN 250 MG PO TABS: ORAL_TABLET | ORAL | 0 refills | Status: DC

## 2017-04-11 NOTE — Progress Notes (Signed)
   Subjective:    Patient ID: [name] [name], male    DOB: 1941/02/06, 77 y.o.   MRN: [id number]  Sore Throat   This is a new problem. The current episode started in the past 7 days. The problem has been waxing and waning. Maximum temperature: 99. The pain is mild. Associated symptoms include congestion, coughing, headaches and a hoarse voice. Pertinent negatives include no ear pain.      Review of Systems  HENT: Positive for congestion and hoarse voice. Negative for ear pain.   Respiratory: Positive for cough.   Neurological: Positive for headaches.  All other systems reviewed and are negative.      Objective:   Physical Exam  Constitutional: He is oriented to person, place, and time. He appears well-developed and well-nourished. No distress.  HENT:  Head: Normocephalic.  Right Ear: External ear normal.  Left Ear: External ear normal.  Nose: Mucosal edema and rhinorrhea present.  Mouth/Throat: Posterior oropharyngeal erythema present.  Eyes: Pupils are equal, round, and reactive to light. Right eye exhibits no discharge. Left eye exhibits no discharge.  Neck: Normal range of motion. Neck supple. No thyromegaly present.  Cardiovascular: Normal rate, regular rhythm, normal heart sounds and intact distal pulses.  No murmur heard. Pulmonary/Chest: Effort normal and breath sounds normal. No respiratory distress. He has no wheezes.  Abdominal: Soft. Bowel sounds are normal. He exhibits no distension. There is no tenderness.  Musculoskeletal: Normal range of motion. He exhibits no edema or tenderness.  Neurological: He is alert and oriented to person, place, and time.  Skin: Skin is warm and dry. No rash noted. No erythema.  Psychiatric: He has a normal mood and affect. His behavior is normal. Judgment and thought content normal.  Vitals reviewed.     BP 136/75   Pulse 65   Temp 97.7 F (36.5 C) (Oral)   Ht 6\' 1"  (1.854 m)   Wt 212 lb (96.2 kg)   BMI 27.97 kg/m        Assessment & Plan:  1. Flu-like symptoms - Rapid Strep Screen (Not at [facility]) - Veritor Flu A/B Waived  2. Viral upper respiratory tract infection - Take meds as prescribed - Use a cool mist humidifier  -Use saline nose sprays frequently -Saline irrigations of the nose can be very helpful if done frequently.  * 4X daily for 1 week*  * Use of a nettie pot can be helpful with this. Follow directions with this* -Force fluids -For any cough or congestion  Use plain Mucinex- regular strength or max strength is fine   * Children- consult with Pharmacist for dosing -For fever or aces or pains- take tylenol or ibuprofen appropriate for age and weight.  * for fevers greater than 101 orally you may alternate ibuprofen and tylenol every  3 hours. -Throat lozenges if help -New toothbrush in 3 days   Since it is Friday, I will send in [place] for patient. Discussed if symptoms worsen or do not improve he can start antibiotic. Continue OTC medications.   Evelina Dun, FNP

## 2017-04-11 NOTE — Patient Instructions (Signed)
Upper Respiratory Infection, Adult Most upper respiratory infections (URIs) are caused by a virus. A URI affects the nose, throat, and upper air passages. The most common type of URI is often called "the common cold." Follow these instructions at home:  Take medicines only as told by your doctor.  Gargle warm saltwater or take cough drops to comfort your throat as told by your doctor.  Use a warm mist humidifier or inhale steam from a shower to increase air moisture. This may make it easier to breathe.  Drink enough fluid to keep your pee (urine) clear or pale yellow.  Eat soups and other clear broths.  Have a healthy diet.  Rest as needed.  Go back to work when your fever is gone or your doctor says it is okay. ? You may need to stay home longer to avoid giving your URI to others. ? You can also wear a face mask and wash your hands often to prevent spread of the virus.  Use your inhaler more if you have asthma.  Do not use any tobacco products, including cigarettes, chewing tobacco, or electronic cigarettes. If you need help quitting, ask your doctor. Contact a doctor if:  You are getting worse, not better.  Your symptoms are not helped by medicine.  You have chills.  You are getting more short of breath.  You have brown or red mucus.  You have yellow or brown discharge from your nose.  You have pain in your face, especially when you bend forward.  You have a fever.  You have puffy (swollen) neck glands.  You have pain while swallowing.  You have white areas in the back of your throat. Get help right away if:  You have very bad or constant: ? Headache. ? Ear pain. ? Pain in your forehead, behind your eyes, and over your cheekbones (sinus pain). ? Chest pain.  You have long-lasting (chronic) lung disease and any of the following: ? Wheezing. ? Long-lasting cough. ? Coughing up blood. ? A change in your usual mucus.  You have a stiff neck.  You have  changes in your: ? Vision. ? Hearing. ? Thinking. ? Mood. This information is not intended to replace advice given to you by your health care provider. Make sure you discuss any questions you have with your health care provider. Document Released: 08/07/2007 Document Revised: 10/22/2015 Document Reviewed: 05/26/2013 Elsevier Interactive Patient Education  2018 Elsevier Inc.  

## 2017-04-17 ENCOUNTER — Other Ambulatory Visit: Payer: Self-pay | Admitting: *Deleted

## 2017-04-17 ENCOUNTER — Telehealth: Payer: Self-pay | Admitting: Family Medicine

## 2017-04-17 DIAGNOSIS — I10 Essential (primary) hypertension: Secondary | ICD-10-CM

## 2017-04-17 DIAGNOSIS — D696 Thrombocytopenia, unspecified: Secondary | ICD-10-CM

## 2017-04-17 DIAGNOSIS — E559 Vitamin D deficiency, unspecified: Secondary | ICD-10-CM

## 2017-04-17 DIAGNOSIS — Z8052 Family history of malignant neoplasm of bladder: Secondary | ICD-10-CM

## 2017-04-17 DIAGNOSIS — E785 Hyperlipidemia, unspecified: Secondary | ICD-10-CM

## 2017-04-17 NOTE — Telephone Encounter (Signed)
Pt called and aware of lab orders

## 2017-04-18 ENCOUNTER — Other Ambulatory Visit: Payer: Medicare Other

## 2017-04-18 DIAGNOSIS — I10 Essential (primary) hypertension: Secondary | ICD-10-CM

## 2017-04-18 DIAGNOSIS — D696 Thrombocytopenia, unspecified: Secondary | ICD-10-CM

## 2017-04-18 DIAGNOSIS — Z8052 Family history of malignant neoplasm of bladder: Secondary | ICD-10-CM

## 2017-04-18 DIAGNOSIS — E785 Hyperlipidemia, unspecified: Secondary | ICD-10-CM

## 2017-04-18 DIAGNOSIS — E559 Vitamin D deficiency, unspecified: Secondary | ICD-10-CM

## 2017-04-18 LAB — MICROSCOPIC EXAMINATION
BACTERIA UA: NONE SEEN
Epithelial Cells (non renal): NONE SEEN /hpf (ref 0–10)
Renal Epithel, UA: NONE SEEN /hpf
WBC UA: NONE SEEN /HPF (ref 0–?)

## 2017-04-18 LAB — URINALYSIS, COMPLETE
BILIRUBIN UA: NEGATIVE
GLUCOSE, UA: NEGATIVE
Ketones, UA: NEGATIVE
Leukocytes, UA: NEGATIVE
NITRITE UA: NEGATIVE
PH UA: 7 (ref 5.0–7.5)
PROTEIN UA: NEGATIVE
RBC UA: NEGATIVE
Specific Gravity, UA: 1.015 (ref 1.005–1.030)
UUROB: 0.2 mg/dL (ref 0.2–1.0)

## 2017-04-19 LAB — CBC WITH DIFFERENTIAL/PLATELET
BASOS: 1 %
Basophils Absolute: 0 10*3/uL (ref 0.0–0.2)
EOS (ABSOLUTE): 0.1 10*3/uL (ref 0.0–0.4)
EOS: 2 %
HEMATOCRIT: 42.7 % (ref 37.5–51.0)
HEMOGLOBIN: 13.8 g/dL (ref 13.0–17.7)
IMMATURE GRANS (ABS): 0 10*3/uL (ref 0.0–0.1)
IMMATURE GRANULOCYTES: 1 %
LYMPHS: 31 %
Lymphocytes Absolute: 1.6 10*3/uL (ref 0.7–3.1)
MCH: 31.3 pg (ref 26.6–33.0)
MCHC: 32.3 g/dL (ref 31.5–35.7)
MCV: 97 fL (ref 79–97)
MONOCYTES: 7 %
Monocytes Absolute: 0.4 10*3/uL (ref 0.1–0.9)
NEUTROS PCT: 58 %
Neutrophils Absolute: 3 10*3/uL (ref 1.4–7.0)
PLATELETS: 196 10*3/uL (ref 150–379)
RBC: 4.41 x10E6/uL (ref 4.14–5.80)
RDW: 14.5 % (ref 12.3–15.4)
WBC: 5.1 10*3/uL (ref 3.4–10.8)

## 2017-04-19 LAB — BMP8+EGFR
BUN/Creatinine Ratio: 29 — ABNORMAL HIGH (ref 10–24)
BUN: 22 mg/dL (ref 8–27)
CALCIUM: 9 mg/dL (ref 8.6–10.2)
CHLORIDE: 106 mmol/L (ref 96–106)
CO2: 23 mmol/L (ref 20–29)
Creatinine, Ser: 0.76 mg/dL (ref 0.76–1.27)
GFR, EST AFRICAN AMERICAN: 104 mL/min/{1.73_m2} (ref >59)
GFR, EST NON AFRICAN AMERICAN: 90 mL/min/{1.73_m2} (ref >59)
Glucose: 91 mg/dL (ref 65–99)
Potassium: 4.5 mmol/L (ref 3.5–5.2)
Sodium: 144 mmol/L (ref 134–144)

## 2017-04-19 LAB — HEPATIC FUNCTION PANEL
ALBUMIN: 4.1 g/dL (ref 3.5–4.8)
ALT: 20 IU/L (ref 0–44)
AST: 21 IU/L (ref 0–40)
Alkaline Phosphatase: 60 IU/L (ref 39–117)
Bilirubin Total: 0.3 mg/dL (ref 0.0–1.2)
Bilirubin, Direct: 0.1 mg/dL (ref 0.00–0.40)
TOTAL PROTEIN: 6.4 g/dL (ref 6.0–8.5)

## 2017-04-19 LAB — NMR, LIPOPROFILE
Cholesterol, Total: 114 mg/dL (ref 100–199)
HDL Particle Number: 33.5 umol/L (ref >=30.5)
HDL-C: 43 mg/dL (ref >39)
LDL PARTICLE NUMBER: 977 nmol/L (ref <1000)
LDL SIZE: 20.8 nm (ref >20.5)
LDL-C: 55 mg/dL (ref 0–99)
LP-IR Score: 66 — ABNORMAL HIGH (ref <=45)
SMALL LDL PARTICLE NUMBER: 590 nmol/L — AB (ref <=527)
Triglycerides: 79 mg/dL (ref 0–149)

## 2017-04-19 LAB — VITAMIN D 25 HYDROXY (VIT D DEFICIENCY, FRACTURES): VIT D 25 HYDROXY: 70 ng/mL (ref 30.0–100.0)

## 2017-04-23 ENCOUNTER — Ambulatory Visit (INDEPENDENT_AMBULATORY_CARE_PROVIDER_SITE_OTHER): Payer: Medicare Other

## 2017-04-23 ENCOUNTER — Ambulatory Visit (INDEPENDENT_AMBULATORY_CARE_PROVIDER_SITE_OTHER): Payer: Medicare Other | Admitting: Family Medicine

## 2017-04-23 ENCOUNTER — Encounter: Payer: Self-pay | Admitting: Family Medicine

## 2017-04-23 VITALS — BP 131/68 | HR 60 | Temp 97.8°F | Ht 73.0 in | Wt 215.0 lb

## 2017-04-23 DIAGNOSIS — D696 Thrombocytopenia, unspecified: Secondary | ICD-10-CM

## 2017-04-23 DIAGNOSIS — I10 Essential (primary) hypertension: Secondary | ICD-10-CM

## 2017-04-23 DIAGNOSIS — N400 Enlarged prostate without lower urinary tract symptoms: Secondary | ICD-10-CM

## 2017-04-23 DIAGNOSIS — Z8052 Family history of malignant neoplasm of bladder: Secondary | ICD-10-CM | POA: Diagnosis not present

## 2017-04-23 DIAGNOSIS — M5440 Lumbago with sciatica, unspecified side: Secondary | ICD-10-CM

## 2017-04-23 DIAGNOSIS — M47816 Spondylosis without myelopathy or radiculopathy, lumbar region: Secondary | ICD-10-CM | POA: Diagnosis not present

## 2017-04-23 DIAGNOSIS — E785 Hyperlipidemia, unspecified: Secondary | ICD-10-CM | POA: Diagnosis not present

## 2017-04-23 DIAGNOSIS — E559 Vitamin D deficiency, unspecified: Secondary | ICD-10-CM | POA: Diagnosis not present

## 2017-04-23 NOTE — Patient Instructions (Addendum)
Medicare Annual Wellness Visit  [facility] and the medical providers at [facility] strive to bring you the best medical care.  In doing so we not only want to address your current medical conditions and concerns but also to detect new conditions early and prevent illness, disease and health-related problems.    Medicare offers a yearly Wellness Visit which allows our clinical staff to assess your need for preventative services including immunizations, lifestyle education, counseling to decrease risk of preventable diseases and screening for fall risk and other medical concerns.    This visit is provided free of charge (no copay) for all Medicare recipients. The clinical pharmacists at [facility] have begun to conduct these Wellness Visits which will also include a thorough review of all your medications.    As you primary medical provider recommend that you make an appointment for your Annual Wellness Visit if you have not done so already this year.  You may set up this appointment before you leave today or you may call back ([phone]) and schedule an appointment.  Please make sure when you call that you mention that you are scheduling your Annual Wellness Visit with the clinical pharmacist so that the appointment may be made for the proper length of time.     Continue current medications. Continue good therapeutic lifestyle changes which include good diet and exercise. Fall precautions discussed with patient. If an FOBT was given today- please return it to our front desk. If you are over 74 years old - you may need Prevnar 1 or the adult Pneumonia vaccine.  **Flu shots are available--- please call and schedule a FLU-CLINIC appointment**  After your visit with [place] today you will receive a survey in the mail or online from [employer] regarding your care with [place]. Please take a moment to fill this out. Your feedback is very  important to [place] as you can help [place] better understand your patient needs as well as improve your experience and satisfaction. WE CARE ABOUT YOU!!!   Avoid heavy lifting pushing pulling as much as possible Lift with your knees and not with your back Take an occasional ibuprofen or Mobic and always take this after eating Use moist heat or ice whichever works best We will call with results of x-rays as soon as these become available Continue with Repatha Try to increase physical activity and eat more healthy.

## 2017-04-23 NOTE — Progress Notes (Signed)
Subjective:    Patient ID: [name] [name], male    DOB: August 18, 1940, 77 y.o.   MRN: [id number]  HPI Pt here for follow up and management of chronic medical problems which includes hypertension and hyperlipidemia. He is taking medication regularly.  The patient today does complain with some back pain.  He has had lab work done recently we will review this with him during the visit today.  His most recent cholesterol numbers with advanced lipid testing have an LDL C that remains good and at goal at 55.  The LDL particle number remains good at 977.  The triglycerides are good at 79.  The urinalysis was negative for RBCs and leukocytes.  The vitamin D level was excellent at 70.  All liver function tests were normal.  The CBC had a normal white blood cell count a good hemoglobin at 13.8 and an adequate platelet count this time.  Blood sugar was good at 91 and the creatinine was good 0.76 and all the electrolytes were within normal limits.  The urine microscopic had no WBCs and only 0-2 red blood cell.  The patient denies any chest pain or shortness of breath.  He denies any trouble or change with his intestinal tract or bowel habits.  He is passing his water without problems other than some frequency.  The back pain that he has he is fairly certain is coming from a disc problem in his back for which she has had problems for years.  He can take a Mobic and it will go away.  In reviewing his records I cannot find where he has had any x-rays of his LS spine specifically.   Patient Active Problem List   Diagnosis Date Noted  . Ventral hernia without obstruction or gangrene 03/28/2016  . AAA (abdominal aortic aneurysm) without rupture ([facility]) 07/20/2015  . Obstructive sleep apnea 03/17/2014  . Erectile dysfunction due to arterial insufficiency 01/12/2014  . Thrombocytopenia ([place]) 12/30/2013  . Allergic rhinitis 10/01/2012  . BPH (benign prostatic hyperplasia) 10/01/2012  . PVC's (premature ventricular  contractions) 09/01/2009  . COLONIC POLYPS 04/14/2009  . Hyperlipidemia LDL goal <70 04/14/2009  . Essential hypertension 04/14/2009  . Coronary atherosclerosis 04/14/2009  . HIATAL HERNIA 04/14/2009  . HERNIATED DISC 04/14/2009   Outpatient Encounter Medications as of 04/23/2017  Medication Sig  . aspirin 81 MG tablet Take 81 mg by mouth 2 (two) times daily.   . Black Pepper-Turmeric (TURMERIC COMPLEX/BLACK PEPPER PO) Take by mouth.  . busPIRone (BUSPAR) 15 MG tablet Take 1 tablet (15 mg total) by mouth 2 (two) times daily as needed. (Patient taking differently: Take 15 mg by mouth daily. )  . Cholecalciferol (VITAMIN D3) 5000 UNITS CAPS Take 1 capsule by mouth daily.  . Evolocumab (REPATHA) 140 MG/ML SOSY Inject 140 mg into the skin every 14 (fourteen) days.  . fluticasone (FLONASE) 50 MCG/ACT nasal spray USE 2 SPRAYS IN EACH NOSTRIL ONCE DAILY.  Marland Kitchen glucosamine-chondroitin 500-400 MG tablet Take 1 tablet by mouth daily.   . meloxicam (MOBIC) 15 MG tablet TAKE (1) TABLET DAILY AS DIRECTED.  Marland Kitchen metoprolol tartrate (LOPRESSOR) 25 MG tablet TAKE 1 TABLET IN THE MORNING AND 2 TABLETS IN THE EVENING  . Multiple Vitamin (MULTI VITAMIN MENS PO) Take 1 tablet by mouth 2 (two) times daily.  . Omega-3 Fatty Acids (SUPER OMEGA-3 PO) Take by mouth.  Marland Kitchen omeprazole (PRILOSEC) 20 MG capsule Take 20 mg by mouth daily.  . sildenafil (VIAGRA) 100 MG tablet Take  0.5-1 tablets (50-100 mg total) by mouth daily as needed for erectile dysfunction.  . nitroGLYCERIN (NITROSTAT) 0.4 MG SL tablet Place 1 tablet (0.4 mg total) under the tongue every 5 (five) minutes as needed for chest pain.  . [DISCONTINUED] azithromycin (ZITHROMAX Z-PAK) 250 MG tablet As directed   No facility-administered encounter medications on file as of 04/23/2017.       Review of Systems  Constitutional: Negative.   HENT: Negative.   Eyes: Negative.   Respiratory: Negative.   Cardiovascular: Negative.   Gastrointestinal: Negative.     Endocrine: Negative.   Genitourinary: Negative.   Musculoskeletal: Positive for back pain.  Skin: Negative.   Allergic/Immunologic: Negative.   Neurological: Negative.   Hematological: Negative.   Psychiatric/Behavioral: Negative.        Objective:   Physical Exam  Constitutional: He is oriented to person, place, and time. He appears well-developed and well-nourished. No distress.  The patient is pleasant and alert  HENT:  Head: Normocephalic and atraumatic.  Right Ear: External ear normal.  Left Ear: External ear normal.  Nose: Nose normal.  Mouth/Throat: Oropharynx is clear and moist. No oropharyngeal exudate.  Eyes: Conjunctivae and EOM are normal. Pupils are equal, round, and reactive to light. Right eye exhibits no discharge. Left eye exhibits no discharge. No scleral icterus.  Neck: Normal range of motion. Neck supple. No thyromegaly present.  No bruits thyromegaly or anterior cervical adenopathy  Cardiovascular: Normal rate, regular rhythm, normal heart sounds and intact distal pulses.  No murmur heard. The heart is regular at 60/min  Pulmonary/Chest: Effort normal and breath sounds normal. No respiratory distress. He has no wheezes. He has no rales. He exhibits no tenderness.  No axillary adenopathy  Abdominal: Soft. Bowel sounds are normal. He exhibits no mass. There is no tenderness. There is no rebound and no guarding.  No liver or spleen enlargement no masses no bruits no inguinal adenopathy with good inguinal pulses  Musculoskeletal: Normal range of motion. He exhibits no edema.  Leg raising good bilaterally without pain.  Good hip abduction without pain.  This pain just seems to come and go and sounds more arthritic in nature.  After reviewing the patient's record there was no previous x-rays.  We will go ahead and get plain films of the back as a baseline today.  Lymphadenopathy:    He has no cervical adenopathy.  Neurological: He is alert and oriented to person,  place, and time. He has normal reflexes. No cranial nerve deficit.  Skin: Skin is warm and dry. No rash noted.  Psychiatric: He has a normal mood and affect. His behavior is normal. Judgment and thought content normal.  Nursing note and vitals reviewed.  BP 131/68 (BP Location: Left Arm)   Pulse 60   Temp 97.8 F (36.6 C) (Oral)   Ht 6\' 1"  (1.854 m)   Wt 215 lb (97.5 kg)   BMI 28.37 kg/m   LS spine films with results pending===      Assessment & Plan:  1. Essential hypertension -Blood pressure is good and patient will continue with current treatment  2. Vitamin D deficiency -Vitamin D level was excellent at 28 and he will continue with current treatment  3. Hyperlipidemia LDL goal <70 -LDL C was good and at goal he will continue with his Repatha  4. Family history of bladder cancer -Urinalysis was clear and within normal limits  5. Thrombocytopenia (HCC) -The platelet count was normal on this occasion.  There is  been no signs of any bleeding issues.  6. Benign prostatic hyperplasia, unspecified whether lower urinary tract symptoms present -Patient does have some frequency but otherwise no complaints with voiding.  7. Low back pain with sciatica, sciatica laterality unspecified, unspecified back pain laterality, unspecified chronicity -This comes and goes intermittently.  It is down the right leg.  He has seen the orthopedic surgeon regarding his knee.  The pain is in the L5-S1 nerve root.  There were no records of any LS spine films and we will get some routine spine films as he leaves from the visit today.  Patient Instructions                       Medicare Annual Wellness Visit  [facility] and the medical providers at [facility] strive to bring you the best medical care.  In doing so we not only want to address your current medical conditions and concerns but also to detect new conditions early and prevent illness, disease and health-related  problems.    Medicare offers a yearly Wellness Visit which allows our clinical staff to assess your need for preventative services including immunizations, lifestyle education, counseling to decrease risk of preventable diseases and screening for fall risk and other medical concerns.    This visit is provided free of charge (no copay) for all Medicare recipients. The clinical pharmacists at [facility] have begun to conduct these Wellness Visits which will also include a thorough review of all your medications.    As you primary medical provider recommend that you make an appointment for your Annual Wellness Visit if you have not done so already this year.  You may set up this appointment before you leave today or you may call back ([phone]) and schedule an appointment.  Please make sure when you call that you mention that you are scheduling your Annual Wellness Visit with the clinical pharmacist so that the appointment may be made for the proper length of time.     Continue current medications. Continue good therapeutic lifestyle changes which include good diet and exercise. Fall precautions discussed with patient. If an FOBT was given today- please return it to our front desk. If you are over 59 years old - you may need Prevnar 73 or the adult Pneumonia vaccine.  **Flu shots are available--- please call and schedule a FLU-CLINIC appointment**  After your visit with [place] today you will receive a survey in the mail or online from [employer] regarding your care with [place]. Please take a moment to fill this out. Your feedback is very important to [place] as you can help [place] better understand your patient needs as well as improve your experience and satisfaction. WE CARE ABOUT YOU!!!   Avoid heavy lifting pushing pulling as much as possible Lift with your knees and not with your back Take an occasional ibuprofen or Mobic and always take this after eating Use moist heat or ice  whichever works best We will call with results of x-rays as soon as these become available Continue with Repatha Try to increase physical activity and eat more healthy.  Arrie Senate MD

## 2017-04-24 DIAGNOSIS — H04123 Dry eye syndrome of bilateral lacrimal glands: Secondary | ICD-10-CM | POA: Diagnosis not present

## 2017-04-24 DIAGNOSIS — H2513 Age-related nuclear cataract, bilateral: Secondary | ICD-10-CM | POA: Diagnosis not present

## 2017-05-05 NOTE — Assessment & Plan Note (Signed)
He sleeps better with CPAP and is very comfortable continuing.  Pressure range works well.  Download confirms excellent compliance and control.  He is encouraged to continue.

## 2017-05-08 DIAGNOSIS — M179 Osteoarthritis of knee, unspecified: Secondary | ICD-10-CM | POA: Insufficient documentation

## 2017-05-08 DIAGNOSIS — M1710 Unilateral primary osteoarthritis, unspecified knee: Secondary | ICD-10-CM | POA: Insufficient documentation

## 2017-05-08 DIAGNOSIS — M170 Bilateral primary osteoarthritis of knee: Secondary | ICD-10-CM | POA: Diagnosis not present

## 2017-05-14 ENCOUNTER — Other Ambulatory Visit: Payer: Self-pay | Admitting: Dermatology

## 2017-05-14 DIAGNOSIS — L820 Inflamed seborrheic keratosis: Secondary | ICD-10-CM | POA: Diagnosis not present

## 2017-05-14 DIAGNOSIS — D485 Neoplasm of uncertain behavior of skin: Secondary | ICD-10-CM | POA: Diagnosis not present

## 2017-05-14 DIAGNOSIS — L821 Other seborrheic keratosis: Secondary | ICD-10-CM | POA: Diagnosis not present

## 2017-05-14 DIAGNOSIS — D229 Melanocytic nevi, unspecified: Secondary | ICD-10-CM | POA: Diagnosis not present

## 2017-06-26 ENCOUNTER — Encounter: Payer: Self-pay | Admitting: Nurse Practitioner

## 2017-06-26 ENCOUNTER — Ambulatory Visit (INDEPENDENT_AMBULATORY_CARE_PROVIDER_SITE_OTHER): Payer: Medicare Other | Admitting: Nurse Practitioner

## 2017-06-26 VITALS — BP 141/74 | HR 55 | Temp 97.6°F | Ht 73.0 in | Wt 216.0 lb

## 2017-06-26 DIAGNOSIS — S3091XA Unspecified superficial injury of lower back and pelvis, initial encounter: Secondary | ICD-10-CM | POA: Diagnosis not present

## 2017-06-26 DIAGNOSIS — W57XXXA Bitten or stung by nonvenomous insect and other nonvenomous arthropods, initial encounter: Secondary | ICD-10-CM | POA: Diagnosis not present

## 2017-06-26 MED ORDER — DOXYCYCLINE HYCLATE 100 MG PO TABS: 100.0000 mg | ORAL_TABLET | Freq: Two times a day (BID) | ORAL | 0 refills | Status: DC

## 2017-06-26 NOTE — Progress Notes (Signed)
   Subjective:    Patient ID: [name] [name], male    DOB: 12-Feb-1941, 78 y.o.   MRN: [id number]  HPI Patiient come sin today thinking he had a tick bite. He has a place right lower back at belt line, that is red and slightly swollen. He is afraid it has come from a tick bite. He denies removing any tick.     Review of Systems  Respiratory: Negative.   Cardiovascular: Negative.   Neurological: Positive for dizziness, weakness and headaches.  Psychiatric/Behavioral: Negative.   All other systems reviewed and are negative.      Objective:   Physical Exam  Constitutional: He appears well-developed and well-nourished. No distress.  Cardiovascular: Normal rate and regular rhythm.  Pulmonary/Chest: Effort normal and breath sounds normal.  Skin: Skin is warm.  4cm annular raised indurated lesion on right upper buttocks. Has center puncture wound  Psychiatric: He has a normal mood and affect. His behavior is normal. Judgment and thought content normal.   BP (!) 141/74   Pulse (!) 55   Temp 97.6 F (36.4 C) (Oral)   Ht 6\' 1"  (1.854 m)   Wt 216 lb (98 kg)   BMI 28.50 kg/m       Assessment & Plan:   1. Bug bite, initial encounter    Meds ordered this encounter  Medications  . doxycycline (VIBRA-TABS) 100 MG tablet    Sig: Take 1 tablet (100 mg total) by mouth 2 (two) times daily. 1 po bid    Dispense:  28 tablet    Refill:  0    Order Specific Question:   Supervising Provider    Answer:   Eustaquio Maize [[id number]]   Avoid scratching or picking at area RTO if not improving  Mary-Margaret Hassell Done, FNP'

## 2017-06-26 NOTE — Patient Instructions (Signed)
Insect Bite, Adult An insect bite can make your skin red, itchy, and swollen. Some insects can spread disease to people with a bite. However, most insect bites do not lead to disease, and most are not serious. Follow these instructions at home: Bite area care  Do not scratch the bite area.  Keep the bite area clean and dry.  Wash the bite area every day with soap and water as told by your doctor.  Check the bite area every day for signs of infection. Check for: ? More redness, swelling, or pain. ? Fluid or blood. ? Warmth. ? Pus. Managing pain, itching, and swelling  You may put any of these on the bite area as told by your doctor: ? A baking soda paste. ? Cortisone cream. ? Calamine lotion.  If directed, put ice on the bite area. ? Put ice in a plastic bag. ? Place a towel between your skin and the bag. ? Leave the ice on for 20 minutes, 2-3 times a day. Medicines  Take medicines or put medicines on your skin only as told by your doctor.  If you were prescribed an antibiotic medicine, use it as told by your doctor. Do not stop using the antibiotic even if your condition improves. General instructions  Keep all follow-up visits as told by your doctor. This is important. How is this prevented? To help you have a lower risk of insect bites:  When you are outside, wear clothing that covers your arms and legs.  Use insect repellent. The best insect repellents have: ? An active ingredient of DEET, picaridin, oil of lemon eucalyptus (OLE), or IR3535. ? Higher amounts of DEET or another active ingredient than other repellents have.  If your home windows do not have screens, think about putting some in.  Contact a doctor if:  You have more redness, swelling, or pain in the bite area.  You have fluid, blood, or pus coming from the bite area.  The bite area feels warm.  You have a fever. Get help right away if:  You have joint pain.  You have a rash.  You have  shortness of breath.  You feel more tired or sleepy than you normally do.  You have neck pain.  You have a headache.  You feel weaker than you normally do.  You have chest pain.  You have pain in your belly.  You feel sick to your stomach (nauseous) or you throw up (vomit). Summary  An insect bite can make your skin red, itchy, and swollen.  Do not scratch the bite area, and keep it clean and dry.  Ice can help with pain and itching from the bite. This information is not intended to replace advice given to you by your health care provider. Make sure you discuss any questions you have with your health care provider. Document Released: 02/16/2000 Document Revised: 09/21/2015 Document Reviewed: 07/06/2014 Elsevier Interactive Patient Education  2018 Elsevier Inc.  

## 2017-07-22 ENCOUNTER — Other Ambulatory Visit: Payer: Self-pay | Admitting: Family Medicine

## 2017-07-22 ENCOUNTER — Other Ambulatory Visit: Payer: Self-pay | Admitting: Cardiology

## 2017-07-22 NOTE — Telephone Encounter (Signed)
REFILL 

## 2017-07-25 DIAGNOSIS — M1712 Unilateral primary osteoarthritis, left knee: Secondary | ICD-10-CM | POA: Diagnosis not present

## 2017-07-25 DIAGNOSIS — M1711 Unilateral primary osteoarthritis, right knee: Secondary | ICD-10-CM | POA: Diagnosis not present

## 2017-08-01 DIAGNOSIS — M170 Bilateral primary osteoarthritis of knee: Secondary | ICD-10-CM | POA: Diagnosis not present

## 2017-08-05 ENCOUNTER — Ambulatory Visit: Payer: Medicare Other | Admitting: *Deleted

## 2017-08-08 DIAGNOSIS — M170 Bilateral primary osteoarthritis of knee: Secondary | ICD-10-CM | POA: Diagnosis not present

## 2017-08-22 ENCOUNTER — Other Ambulatory Visit: Payer: Medicare Other

## 2017-08-22 DIAGNOSIS — E559 Vitamin D deficiency, unspecified: Secondary | ICD-10-CM

## 2017-08-22 DIAGNOSIS — R6889 Other general symptoms and signs: Secondary | ICD-10-CM | POA: Diagnosis not present

## 2017-08-22 DIAGNOSIS — I10 Essential (primary) hypertension: Secondary | ICD-10-CM

## 2017-08-22 DIAGNOSIS — E785 Hyperlipidemia, unspecified: Secondary | ICD-10-CM | POA: Diagnosis not present

## 2017-08-22 DIAGNOSIS — Z125 Encounter for screening for malignant neoplasm of prostate: Secondary | ICD-10-CM | POA: Diagnosis not present

## 2017-08-23 LAB — BMP8+EGFR
BUN/Creatinine Ratio: 21 (ref 10–24)
BUN: 19 mg/dL (ref 8–27)
CO2: 24 mmol/L (ref 20–29)
CREATININE: 0.9 mg/dL (ref 0.76–1.27)
Calcium: 9.3 mg/dL (ref 8.6–10.2)
Chloride: 105 mmol/L (ref 96–106)
GFR calc Af Amer: 97 mL/min/{1.73_m2} (ref >59)
GFR, EST NON AFRICAN AMERICAN: 84 mL/min/{1.73_m2} (ref >59)
GLUCOSE: 90 mg/dL (ref 65–99)
Potassium: 4.6 mmol/L (ref 3.5–5.2)
SODIUM: 144 mmol/L (ref 134–144)

## 2017-08-23 LAB — VITAMIN D 25 HYDROXY (VIT D DEFICIENCY, FRACTURES): Vit D, 25-Hydroxy: 83.7 ng/mL (ref 30.0–100.0)

## 2017-08-23 LAB — CBC WITH DIFFERENTIAL/PLATELET
BASOS: 0 %
Basophils Absolute: 0 10*3/uL (ref 0.0–0.2)
EOS (ABSOLUTE): 0.1 10*3/uL (ref 0.0–0.4)
EOS: 2 %
HEMATOCRIT: 42.2 % (ref 37.5–51.0)
HEMOGLOBIN: 14.2 g/dL (ref 13.0–17.7)
IMMATURE GRANS (ABS): 0 10*3/uL (ref 0.0–0.1)
IMMATURE GRANULOCYTES: 1 %
LYMPHS: 33 %
Lymphocytes Absolute: 2.1 10*3/uL (ref 0.7–3.1)
MCH: 31.3 pg (ref 26.6–33.0)
MCHC: 33.6 g/dL (ref 31.5–35.7)
MCV: 93 fL (ref 79–97)
MONOCYTES: 8 %
Monocytes Absolute: 0.5 10*3/uL (ref 0.1–0.9)
NEUTROS PCT: 56 %
Neutrophils Absolute: 3.5 10*3/uL (ref 1.4–7.0)
Platelets: 168 10*3/uL (ref 150–450)
RBC: 4.53 x10E6/uL (ref 4.14–5.80)
RDW: 13.8 % (ref 12.3–15.4)
WBC: 6.3 10*3/uL (ref 3.4–10.8)

## 2017-08-23 LAB — HEPATIC FUNCTION PANEL
ALBUMIN: 4.3 g/dL (ref 3.5–4.8)
ALT: 15 IU/L (ref 0–44)
AST: 21 IU/L (ref 0–40)
Alkaline Phosphatase: 59 IU/L (ref 39–117)
BILIRUBIN TOTAL: 0.4 mg/dL (ref 0.0–1.2)
BILIRUBIN, DIRECT: 0.15 mg/dL (ref 0.00–0.40)
TOTAL PROTEIN: 6.3 g/dL (ref 6.0–8.5)

## 2017-08-23 LAB — NMR, LIPOPROFILE
Cholesterol, Total: 129 mg/dL (ref 100–199)
HDL PARTICLE NUMBER: 35.2 umol/L (ref >=30.5)
HDL-C: 50 mg/dL (ref >39)
LDL PARTICLE NUMBER: 850 nmol/L (ref <1000)
LDL SIZE: 21 nm (ref >20.5)
LDL-C: 61 mg/dL (ref 0–99)
LP-IR Score: 49 — ABNORMAL HIGH (ref <=45)
Small LDL Particle Number: 324 nmol/L (ref <=527)
TRIGLYCERIDES: 90 mg/dL (ref 0–149)

## 2017-08-28 ENCOUNTER — Ambulatory Visit (INDEPENDENT_AMBULATORY_CARE_PROVIDER_SITE_OTHER): Payer: Medicare Other | Admitting: Family Medicine

## 2017-08-28 ENCOUNTER — Encounter: Payer: Self-pay | Admitting: Family Medicine

## 2017-08-28 ENCOUNTER — Telehealth: Payer: Self-pay | Admitting: Family Medicine

## 2017-08-28 VITALS — BP 132/72 | HR 66 | Temp 98.2°F | Ht 73.0 in | Wt 212.0 lb

## 2017-08-28 DIAGNOSIS — E785 Hyperlipidemia, unspecified: Secondary | ICD-10-CM | POA: Diagnosis not present

## 2017-08-28 DIAGNOSIS — I714 Abdominal aortic aneurysm, without rupture, unspecified: Secondary | ICD-10-CM

## 2017-08-28 DIAGNOSIS — Z8052 Family history of malignant neoplasm of bladder: Secondary | ICD-10-CM

## 2017-08-28 DIAGNOSIS — N400 Enlarged prostate without lower urinary tract symptoms: Secondary | ICD-10-CM | POA: Diagnosis not present

## 2017-08-28 DIAGNOSIS — E559 Vitamin D deficiency, unspecified: Secondary | ICD-10-CM

## 2017-08-28 DIAGNOSIS — H6123 Impacted cerumen, bilateral: Secondary | ICD-10-CM

## 2017-08-28 DIAGNOSIS — D696 Thrombocytopenia, unspecified: Secondary | ICD-10-CM

## 2017-08-28 DIAGNOSIS — I10 Essential (primary) hypertension: Secondary | ICD-10-CM

## 2017-08-28 DIAGNOSIS — R5383 Other fatigue: Secondary | ICD-10-CM | POA: Diagnosis not present

## 2017-08-28 DIAGNOSIS — R5381 Other malaise: Secondary | ICD-10-CM | POA: Diagnosis not present

## 2017-08-28 DIAGNOSIS — N5201 Erectile dysfunction due to arterial insufficiency: Secondary | ICD-10-CM

## 2017-08-28 LAB — URINALYSIS, COMPLETE
BILIRUBIN UA: NEGATIVE
Glucose, UA: NEGATIVE
KETONES UA: NEGATIVE
LEUKOCYTES UA: NEGATIVE
Nitrite, UA: NEGATIVE
PH UA: 5.5 (ref 5.0–7.5)
PROTEIN UA: NEGATIVE
SPEC GRAV UA: 1.015 (ref 1.005–1.030)
UUROB: 0.2 mg/dL (ref 0.2–1.0)

## 2017-08-28 LAB — MICROSCOPIC EXAMINATION
Bacteria, UA: NONE SEEN
EPITHELIAL CELLS (NON RENAL): NONE SEEN /HPF (ref 0–10)
RBC, UA: NONE SEEN /hpf (ref 0–2)
RENAL EPITHEL UA: NONE SEEN /HPF
WBC UA: NONE SEEN /HPF (ref 0–5)

## 2017-08-28 NOTE — Progress Notes (Signed)
Subjective:    Patient ID: [name] [name], male    DOB: 04/11/1940, 77 y.o.   MRN: [id number]  HPI Pt here for follow up and management of chronic medical problems which includes hyperlipidemia and hypertension. He is taking medication regularly.  This patient is doing well overall.  He is being followed for his right knee problems with the orthopedic surgeon.  He has had blood work done recently and this will be reviewed with him during the visit today.  He is on Repatha.  All his cholesterol numbers were excellent with advanced lipid testing with a total LDL particle number being 850 and this is lower than it was previously.  The LDL C was 61 triglycerides are good at 90 and the good cholesterol was good at 35.2 with the HDL particle number.  The blood sugar renal and electrolytes were all normal.  The CBC was excellent with a normal hemoglobin normal white blood cell count and adequate platelet count.  All liver function tests were normal.  The vitamin D level was excellent at 83.7.  Patient says he has close to end-stage right knee disease and is considering having knee replacement sometime this fall.  He understands he should get a cardiac clearance prior to that.  He is just been married for 46 years and has an incredible family and is very thankful for where he is at this point in his life.  He denies any chest pain pressure tightness or palpitations.  He denies any trouble with swallowing heartburn indigestion nausea vomiting diarrhea blood in the stool or change in bowel habits.  He had a colonoscopy in 2017 by Dr. May God and says that everything was fine as far as he knows with the colonoscopy.  There is no family history of colon cancer.  He is passing his water well but is concerned because he has erectile dysfunction and is not performing as well as he would like.    Patient Active Problem List   Diagnosis Date Noted  . Ventral hernia without obstruction or gangrene 03/28/2016  . AAA  (abdominal aortic aneurysm) without rupture ([facility]) 07/20/2015  . Obstructive sleep apnea 03/17/2014  . Erectile dysfunction due to arterial insufficiency 01/12/2014  . Thrombocytopenia ([facility]) 12/30/2013  . Allergic rhinitis 10/01/2012  . BPH (benign prostatic hyperplasia) 10/01/2012  . PVC's (premature ventricular contractions) 09/01/2009  . COLONIC POLYPS 04/14/2009  . Hyperlipidemia LDL goal <70 04/14/2009  . Essential hypertension 04/14/2009  . Coronary atherosclerosis 04/14/2009  . HIATAL HERNIA 04/14/2009  . HERNIATED DISC 04/14/2009   Outpatient Encounter Medications as of 08/28/2017  Medication Sig  . aspirin 81 MG tablet Take 81 mg by mouth 2 (two) times daily.   . Black Pepper-Turmeric (TURMERIC COMPLEX/BLACK PEPPER PO) Take by mouth.  . busPIRone (BUSPAR) 15 MG tablet Take 1 tablet (15 mg total) by mouth 2 (two) times daily as needed. (Patient taking differently: Take 15 mg by mouth daily. )  . Cholecalciferol (VITAMIN D3) 5000 UNITS CAPS Take 1 capsule by mouth daily.  Marland Kitchen doxycycline (VIBRA-TABS) 100 MG tablet Take 1 tablet (100 mg total) by mouth 2 (two) times daily. 1 po bid  . Evolocumab (REPATHA) 140 MG/ML SOSY Inject 140 mg into the skin every 14 (fourteen) days.  . fluticasone (FLONASE) 50 MCG/ACT nasal spray USE 2 SPRAYS IN EACH NOSTRIL ONCE DAILY.  Marland Kitchen glucosamine-chondroitin 500-400 MG tablet Take 2 tablets by mouth daily.  . meloxicam (MOBIC) 15 MG tablet TAKE (1) TABLET DAILY AS  DIRECTED.  Marland Kitchen metoprolol tartrate (LOPRESSOR) 25 MG tablet TAKE 1 TABLET IN THE MORNING AND 2 TABLETS IN THE EVENING  . Multiple Vitamin (MULTI VITAMIN MENS PO) Take 1 tablet by mouth 2 (two) times daily.  . Omega-3 Fatty Acids (SUPER OMEGA-3 PO) Take by mouth.  Marland Kitchen omeprazole (PRILOSEC) 20 MG capsule Take 20 mg by mouth daily.  . sildenafil (VIAGRA) 100 MG tablet Take 0.5-1 tablets (50-100 mg total) by mouth daily as needed for erectile dysfunction.  . nitroGLYCERIN (NITROSTAT) 0.4 MG SL tablet  Place 1 tablet (0.4 mg total) under the tongue every 5 (five) minutes as needed for chest pain.   No facility-administered encounter medications on file as of 08/28/2017.       Review of Systems  Constitutional: Negative.   HENT: Negative.   Eyes: Negative.   Respiratory: Negative.   Cardiovascular: Negative.   Gastrointestinal: Negative.   Endocrine: Negative.   Genitourinary: Negative.   Musculoskeletal: Positive for arthralgias (right knee pain - Allusio following).  Skin: Negative.   Allergic/Immunologic: Negative.   Neurological: Negative.   Hematological: Negative.   Psychiatric/Behavioral: Negative.        Objective:   Physical Exam  Constitutional: He is oriented to person, place, and time. He appears well-developed and well-nourished. No distress.  The patient is pleasant and alert and has been a good patient through the years.  HENT:  Head: Normocephalic and atraumatic.  Nose: Nose normal.  Mouth/Throat: Oropharynx is clear and moist. No oropharyngeal exudate.  Bilateral ear cerumen  Eyes: Pupils are equal, round, and reactive to light. Conjunctivae and EOM are normal. Right eye exhibits no discharge. Left eye exhibits no discharge. No scleral icterus.  Exam done in January  Neck: Normal range of motion. Neck supple. No thyromegaly present.  No bruits thyromegaly or anterior cervical adenopathy  Cardiovascular: Normal rate, regular rhythm, normal heart sounds and intact distal pulses.  No murmur heard. Heart is regular at 60/min  Pulmonary/Chest: Effort normal and breath sounds normal. He has no wheezes. He has no rales. He exhibits no tenderness.  Clear anteriorly and posteriorly and no axillary adenopathy  Abdominal: Soft. Bowel sounds are normal. He exhibits no mass. There is no tenderness. There is no rebound and no guarding.  Genitourinary: Rectum normal, prostate normal and penis normal.  Genitourinary Comments: The prostate was slightly enlarged but smooth  with no lumps or masses.  There were no rectal masses.  The external genitalia were within normal limits and no hernias were palpated.  Musculoskeletal: He exhibits edema. He exhibits no tenderness.  There was limited movement of the right knee secondary to the osteoarthritis and end-stage disease.  He is seeing the orthopedic surgeon regularly and will work on getting scheduled for a knee replacement when it is deemed necessary.  Lymphadenopathy:    He has no cervical adenopathy.  Neurological: He is alert and oriented to person, place, and time. He has normal reflexes. No cranial nerve deficit.  Skin: Skin is warm and dry. No rash noted. No erythema. No pallor.  2 insect bites right anterior hip area  Psychiatric: He has a normal mood and affect. His behavior is normal. Judgment and thought content normal.  Normal mood affect and behavior  Nursing note and vitals reviewed.   BP 132/72 (BP Location: Left Arm)   Pulse 66   Temp 98.2 F (36.8 C) (Oral)   Ht 6\' 1"  (1.854 m)   Wt 212 lb (96.2 kg)   BMI 27.97 kg/m  The urinalysis was clear and within normal limits. Both ears were irrigated to remove cerumen and patient tolerated procedure well.     Assessment & Plan:  1. Family history of bladder cancer -The patient's urinalysis was clear of red blood cells and infection today. - Urinalysis, Complete  2. Essential hypertension -The blood pressure was good and he will continue with current treatment  3. Vitamin D deficiency -Vitamin D level was excellent and he will continue with current treatment  4. Hyperlipidemia LDL goal <70 -Advanced lipid panel was excellent with an LDL that was good and at goal.  5. Thrombocytopenia (HCC) -No decreased platelets on lab work done most recently.  6. Benign prostatic hyperplasia, unspecified whether lower urinary tract symptoms present -Patient continues to have an enlarged prostate with no symptoms other than erectile dysfunction -Add  PSA to lab work already done.  7. Erectile dysfunction due to arterial insufficiency -Patient has tried multiple agents with no improvement including Cialis Viagra. -He was encouraged to schedule an appointment with the urologist for further evaluation and suggestions of other alternatives  8. AAA (abdominal aortic aneurysm) without rupture (HCC) -Follow-up of this as deemed appropriate especially prior to his knee replacement surgery once this gets planned.  9. Malaise and fatigue -Add B12 level to lab work done today   Patient Instructions                       Medicare Annual Wellness Visit  [facility] and the medical providers at [facility] strive to bring you the best medical care.  In doing so we not only want to address your current medical conditions and concerns but also to detect new conditions early and prevent illness, disease and health-related problems.    Medicare offers a yearly Wellness Visit which allows our clinical staff to assess your need for preventative services including immunizations, lifestyle education, counseling to decrease risk of preventable diseases and screening for fall risk and other medical concerns.    This visit is provided free of charge (no copay) for all Medicare recipients. The clinical pharmacists at [facility] have begun to conduct these Wellness Visits which will also include a thorough review of all your medications.    As you primary medical provider recommend that you make an appointment for your Annual Wellness Visit if you have not done so already this year.  You may set up this appointment before you leave today or you may call back ([phone]) and schedule an appointment.  Please make sure when you call that you mention that you are scheduling your Annual Wellness Visit with the clinical pharmacist so that the appointment may be made for the proper length of time.     Continue current  medications. Continue good therapeutic lifestyle changes which include good diet and exercise. Fall precautions discussed with patient. If an FOBT was given today- please return it to our front desk. If you are over 36 years old - you may need Prevnar 64 or the adult Pneumonia vaccine.  **Flu shots are available--- please call and schedule a FLU-CLINIC appointment**  After your visit with [place] today you will receive a survey in the mail or online from [employer] regarding your care with [place]. Please take a moment to fill this out. Your feedback is very important to [place] as you can help [place] better understand your patient needs as well as improve your experience and satisfaction. WE CARE ABOUT  YOU!!!   Discuss with the urologist other possible options for erectile dysfunction since you have tried most medicines and these have not worked. Follow-up with orthopedist as planned to schedule for knee replacement when that time arrives and make sure that cardiology referral is arranged prior to surgery Continue to stay active physically drink plenty of fluids and stay well-hydrated the summer Continue to get eye exam every 1 to 2 years. General lab work which will have a B12 level and PSA level soon as that becomes available    Arrie Senate MD

## 2017-08-28 NOTE — Patient Instructions (Addendum)
Medicare Annual Wellness Visit  [facility] and the medical providers at [facility] strive to bring you the best medical care.  In doing so we not only want to address your current medical conditions and concerns but also to detect new conditions early and prevent illness, disease and health-related problems.    Medicare offers a yearly Wellness Visit which allows our clinical staff to assess your need for preventative services including immunizations, lifestyle education, counseling to decrease risk of preventable diseases and screening for fall risk and other medical concerns.    This visit is provided free of charge (no copay) for all Medicare recipients. The clinical pharmacists at [facility] have begun to conduct these Wellness Visits which will also include a thorough review of all your medications.    As you primary medical provider recommend that you make an appointment for your Annual Wellness Visit if you have not done so already this year.  You may set up this appointment before you leave today or you may call back ([phone]) and schedule an appointment.  Please make sure when you call that you mention that you are scheduling your Annual Wellness Visit with the clinical pharmacist so that the appointment may be made for the proper length of time.     Continue current medications. Continue good therapeutic lifestyle changes which include good diet and exercise. Fall precautions discussed with patient. If an FOBT was given today- please return it to our front desk. If you are over 65 years old - you may need Prevnar 14 or the adult Pneumonia vaccine.  **Flu shots are available--- please call and schedule a FLU-CLINIC appointment**  After your visit with [place] today you will receive a survey in the mail or online from [employer] regarding your care with [place]. Please take a moment to fill this out. Your feedback is very  important to [place] as you can help [place] better understand your patient needs as well as improve your experience and satisfaction. WE CARE ABOUT YOU!!!   Discuss with the urologist other possible options for erectile dysfunction since you have tried most medicines and these have not worked. Follow-up with orthopedist as planned to schedule for knee replacement when that time arrives and make sure that cardiology referral is arranged prior to surgery Continue to stay active physically drink plenty of fluids and stay well-hydrated the summer Continue to get eye exam every 1 to 2 years. General lab work which will have a B12 level and PSA level soon as that becomes available

## 2017-08-28 NOTE — Telephone Encounter (Signed)
Pt wants to know if he needs PSA and Vitamin B12 level drawn since it had been talked about earlier today. The pt would need to have more blood drawn since it has been 6 days.

## 2017-08-28 NOTE — Telephone Encounter (Signed)
Pt aware these were added in the lab

## 2017-08-29 LAB — SPECIMEN STATUS REPORT

## 2017-08-29 LAB — PSA TOTAL+% FREE (SERIAL)
PSA FREE PCT: 15.6 %
PSA FREE: 0.28 ng/mL
Prostate Specific Ag, Serum: 1.8 ng/mL (ref 0.0–4.0)

## 2017-08-29 LAB — VITAMIN B12: Vitamin B-12: 1371 pg/mL — ABNORMAL HIGH (ref 232–1245)

## 2017-09-02 ENCOUNTER — Encounter: Payer: Self-pay | Admitting: *Deleted

## 2017-09-02 ENCOUNTER — Ambulatory Visit (INDEPENDENT_AMBULATORY_CARE_PROVIDER_SITE_OTHER): Payer: Medicare Other | Admitting: *Deleted

## 2017-09-02 VITALS — BP 146/70 | HR 58

## 2017-09-02 DIAGNOSIS — Z0000 Encounter for general adult medical examination without abnormal findings: Secondary | ICD-10-CM

## 2017-09-02 NOTE — Progress Notes (Addendum)
Subjective:   [name] [name] is a 77 y.o. male who presents for a Medicare Annual Wellness Visit. [name] [name] lives at home with his wife.   Review of Systems    Patient reports that his overall health is unchanged compared to last year.  Cardiac Risk Factors include: advanced age (>13men, >48 women);dyslipidemia;hypertension;male gender;Other (see comment), Risk factor comments: Hx of CABG  Musc: right knee pain and swelling that is worse after activity. Ice helps. Has had injections and likely needs a replacement.   All other systems negative       Current Medications (verified) Outpatient Encounter Medications as of 09/02/2017  Medication Sig  . aspirin 81 MG tablet Take 81 mg by mouth 2 (two) times daily.   . Black Pepper-Turmeric (TURMERIC COMPLEX/BLACK PEPPER PO) Take by mouth.  . busPIRone (BUSPAR) 15 MG tablet Take 1 tablet (15 mg total) by mouth 2 (two) times daily as needed. (Patient taking differently: Take 15 mg by mouth daily. )  . Cholecalciferol (VITAMIN D3) 5000 UNITS CAPS Take 1 capsule by mouth daily.  . Evolocumab (REPATHA) 140 MG/ML SOSY Inject 140 mg into the skin every 14 (fourteen) days.  . fluticasone (FLONASE) 50 MCG/ACT nasal spray USE 2 SPRAYS IN EACH NOSTRIL ONCE DAILY.  Marland Kitchen glucosamine-chondroitin 500-400 MG tablet Take 2 tablets by mouth daily.  . meloxicam (MOBIC) 15 MG tablet TAKE (1) TABLET DAILY AS DIRECTED.  Marland Kitchen metoprolol tartrate (LOPRESSOR) 25 MG tablet TAKE 1 TABLET IN THE MORNING AND 2 TABLETS IN THE EVENING  . Multiple Vitamin (MULTI VITAMIN MENS PO) Take 1 tablet by mouth 2 (two) times daily.  . Omega-3 Fatty Acids (SUPER OMEGA-3 PO) Take by mouth.  Marland Kitchen omeprazole (PRILOSEC) 20 MG capsule Take 20 mg by mouth daily.  . sildenafil (VIAGRA) 100 MG tablet Take 0.5-1 tablets (50-100 mg total) by mouth daily as needed for erectile dysfunction.  . [DISCONTINUED] doxycycline (VIBRA-TABS) 100 MG tablet Take 1 tablet (100 mg total) by mouth 2  (two) times daily. 1 po bid  . nitroGLYCERIN (NITROSTAT) 0.4 MG SL tablet Place 1 tablet (0.4 mg total) under the tongue every 5 (five) minutes as needed for chest pain.   No facility-administered encounter medications on file as of 09/02/2017.     Allergies (verified) Levofloxacin; Crestor [rosuvastatin]; and Vytorin [ezetimibe-simvastatin]   History: Past Medical History:  Diagnosis Date  . CAD (coronary artery disease)   . Colon polyps   . Dyslipidemia   . Herniated disc   . Hiatal hernia   . HTN (hypertension)   . Hyperlipidemia   . Kidney stones    Past Surgical History:  Procedure Laterality Date  . CORONARY ARTERY BYPASS GRAFT  2006   LIMA to LAD, SVG to RCA, SVG to circumflex, SVG to diagonal   . mole removed  2003  . right knee surgery  2008  . TONSILLECTOMY     Family History  Problem Relation Age of Onset  . Heart disease Father   . Cancer Father        Bladder Cancer  . Heart attack Father   . Hypertension Mother   . Alzheimer's disease Mother   . Stroke Mother   . Pneumonia Sister   . Heart disease Brother        stents  . Diabetes Brother        type 2   Social History   Socioeconomic History  . Marital status: Married    Spouse name: Not  on file  . Number of children: 2  . Years of education: Not on file  . Highest education level: Bachelor's degree (e.g., BA, AB, BS)  Occupational History  . Occupation: Armed forces technical officer for [employer]  . Financial resource strain: Not hard at all  . Food insecurity:    Worry: Never true    Inability: Never true  . Transportation needs:    Medical: No    Non-medical: No  Tobacco Use  . Smoking status: Never Smoker  . Smokeless tobacco: Never Used  . Tobacco comment: does not smoke   Substance and Sexual Activity  . Alcohol use: Yes    Alcohol/week: 0.0 oz    Comment: with meal  . Drug use: No  . Sexual activity: Yes  Lifestyle  . Physical activity:    Days per week: 3  days    Minutes per session: 60 min  . Stress: Not at all  Relationships  . Social connections:    Talks on phone: More than three times a week    Gets together: More than three times a week    Attends religious service: More than 4 times per year    Active member of club or organization: Yes    Attends meetings of clubs or organizations: More than 4 times per year    Relationship status: Married  Other Topics Concern  . Not on file  Social History Narrative   Married 37+ years, 2 adult children and one granddaughter. Engineer, building services at [employer].     Tobacco Use No.  Clinical Intake:     Pain : No/denies pain     Nutritional Status: BMI > 30  Obese Diabetes: No  How often do you need to have someone help you when you read instructions, pamphlets, or other written materials from your doctor or pharmacy?: 1 - Never What is the last grade level you completed in school?: bachelor's degree  Interpreter Needed?: No  Information entered by :: Chong Sicilian, RN  Activities of Daily Living In your present state of health, do you have any difficulty performing the following activities: 09/02/2017  Hearing? N  Comment has some mild tinnitus but it doesn't affect him  Vision? N  Comment Has routine eye exams  Difficulty concentrating or making decisions? N  Walking or climbing stairs? N  Dressing or bathing? N  Doing errands, shopping? N  Preparing Food and eating ? N  Using the Toilet? N  In the past six months, have you accidently leaked urine? N  Do you have problems with loss of bowel control? N  Managing your Medications? N  Managing your Finances? N  Housekeeping or managing your Housekeeping? N  Some recent data might be hidden     Diet 3 meals a day. Combination of eating out and eating at home.   Exercise Current Exercise Habits: Home exercise routine, Time (Minutes): 60, Frequency (Times/Week): 3, Weekly Exercise (Minutes/Week): 180, Intensity: Mild,  Exercise limited by: orthopedic condition(s)(right knee pain)    Depression Screen PHQ 2/9 Scores 09/02/2017 08/28/2017 06/26/2017 04/23/2017  PHQ - 2 Score 0 0 0 0  PHQ- 9 Score - - - -     Fall Risk Fall Risk  08/28/2017 06/26/2017 04/23/2017 04/11/2017 03/18/2017  Falls in the past year? No No No No No    Safety Is the patient's home free of loose throw rugs in walkways, pet beds, electrical cords, etc?   yes  Grab bars in the bathroom? no      Walkin shower? yes      Shower Seat? no      Handrails on the stairs?   no      Adequate lighting?   yes  Patient Care Team: Chipper Herb, MD as PCP - General (Family Medicine) Rutherford Guys, MD as Consulting Physician (Ophthalmology) Clarene Essex, MD as Consulting Physician (Gastroenterology)  No hospitalizations, ER visits, or surgeries this past year.  Objective:    Today's Vitals   09/02/17 1027  BP: (!) 146/70  Pulse: (!) 58   There is no height or weight on file to calculate BMI.  Advanced Directives 09/02/2017 07/25/2016 05/09/2014  Does Patient Have a Medical Advance Directive? Yes Yes No  Type of Advance Directive Living will;Healthcare Power of [place];Living will -  Does patient want to make changes to medical advance directive? No - Patient declined Yes (Inpatient - patient defers changing a medical advance directive at this time) -  Copy of [place] in Chart? No - copy requested No - copy requested -  Would patient like information on creating a medical advance directive? - - No - patient declined information    Hearing/Vision  normal or No deficits noted during visit.   Cognitive Function: MMSE - Mini Mental State Exam 09/02/2017 07/25/2016  Orientation to time 5 5  Orientation to Place 5 5  Registration 3 3  Attention/ Calculation 5 5  Recall 2 3  Language- name 2 objects 2 2  Language- repeat 1 1  Language- follow 3 step command 3 3  Language- read & follow  direction 1 1  Write a sentence 1 1  Copy design 1 1  Total score 29 30       Normal Cognitive Function Screening: Yes    Immunizations and Health Maintenance Immunization History  Administered Date(s) Administered  . Influenza, High Dose Seasonal PF 12/21/2015, 12/05/2016  . Influenza,inj,Quad PF,6+ Mos 12/03/2012, 12/30/2013, 01/03/2015  . Pneumococcal Conjugate-13 01/12/2014  . Pneumococcal Polysaccharide-23 10/01/2012  . Tdap 01/17/2011  . Zoster Recombinat (Shingrix) 07/25/2016, 01/09/2017   There are no preventive care reminders to display for this patient. Health Maintenance  Topic Date Due  . INFLUENZA VACCINE  10/02/2017  . COLON CANCER SCREENING ANNUAL FOBT  12/13/2017  . TETANUS/TDAP  01/16/2021  . COLONOSCOPY  06/29/2025  . PNA vac Low Risk Adult  Completed        Assessment:   This is a routine wellness examination for Kameren.      Plan:    Goals    . Exercise 3x per week (30 min per time)        Health Maintenance Recommendations: no recommendations at this time  Additional Screening Recommendations: Lung: Low Dose CT Chest recommended if Age 8-80 years, 30 pack-year currently smoking OR have quit w/in 15years. Patient does not qualify. Hepatitis C Screening recommended: no  Today's Orders No orders of the defined types were placed in this encounter.   Keep f/u with Chipper Herb, MD and any other specialty appointments you may have Continue current medications Move carefully to avoid falls. Use assistive devices like a can or walker if needed. Aim for at least 150 minutes of moderate activity a week. This can be chair exercises if necessary. Reading or puzzles are a good way to exercise your brain Stay connected with friends and family. Social connections are beneficial to your emotional and  mental health.   I have personally reviewed and noted the following in the patient's chart:   . Medical and social history . Use of alcohol,  tobacco or illicit drugs  . Current medications and supplements . Functional ability and status . Nutritional status . Physical activity . Advanced directives . List of other physicians . Hospitalizations, surgeries, and ER visits in previous 12 months . Vitals . Screenings to include cognitive, depression, and falls . Referrals and appointments  In addition, I have reviewed and discussed with patient certain preventive protocols, quality metrics, and best practice recommendations. A written personalized care plan for preventive services as well as general preventive health recommendations were provided to patient.     Chong Sicilian, RN   09/02/2017   I have reviewed and agree with the above AWV documentation.   Arrie Senate MD

## 2017-09-02 NOTE — Patient Instructions (Signed)
  Mr. [name] [name] , Thank you for taking time to come for your Medicare Wellness Visit. I appreciate your ongoing commitment to your health goals. Please review the following plan we discussed and let me know if I can assist you in the future.   These are the goals we discussed: Goals    . Exercise 3x per week (30 min per time)       This is a list of the screening recommended for you and due dates:  Health Maintenance  Topic Date Due  . Flu Shot  10/02/2017  . Stool Blood Test  12/13/2017  . Tetanus Vaccine  01/16/2021  . Colon Cancer Screening  06/29/2025  . Pneumonia vaccines  Completed

## 2017-10-09 ENCOUNTER — Other Ambulatory Visit: Payer: Self-pay | Admitting: Family Medicine

## 2017-10-11 ENCOUNTER — Encounter: Payer: Self-pay | Admitting: Physician Assistant

## 2017-10-11 ENCOUNTER — Ambulatory Visit (INDEPENDENT_AMBULATORY_CARE_PROVIDER_SITE_OTHER): Payer: Medicare Other | Admitting: Physician Assistant

## 2017-10-11 VITALS — BP 135/66 | HR 68 | Temp 98.4°F | Ht 73.0 in | Wt 215.0 lb

## 2017-10-11 DIAGNOSIS — W57XXXA Bitten or stung by nonvenomous insect and other nonvenomous arthropods, initial encounter: Secondary | ICD-10-CM

## 2017-10-11 DIAGNOSIS — S7011XA Contusion of right thigh, initial encounter: Secondary | ICD-10-CM | POA: Diagnosis not present

## 2017-10-11 DIAGNOSIS — M7631 Iliotibial band syndrome, right leg: Secondary | ICD-10-CM

## 2017-10-11 DIAGNOSIS — S7001XA Contusion of right hip, initial encounter: Secondary | ICD-10-CM | POA: Diagnosis not present

## 2017-10-11 DIAGNOSIS — S30861A Insect bite (nonvenomous) of abdominal wall, initial encounter: Secondary | ICD-10-CM

## 2017-10-11 MED ORDER — DOXYCYCLINE HYCLATE 100 MG PO TABS: 100.0000 mg | ORAL_TABLET | Freq: Two times a day (BID) | ORAL | 0 refills | Status: DC

## 2017-10-11 NOTE — Patient Instructions (Signed)

## 2017-10-11 NOTE — Progress Notes (Signed)
BP 135/66   Pulse 68   Temp 98.4 F (36.9 C) (Oral)    Subjective:    Patient ID: [name] [name], male    DOB: Jan 16, 1941, 76 y.o.   MRN: [id number]  HPI: [name] [name] is a 77 y.o. male presenting on 10/11/2017 for knot on right hip from fall and Tick Removal  Last week patient was on vacation and had a fall landing on his right hip.  He immediately stood up and was able to bear weight.  He did full range of motion with his leg at that time.  He did have a significant swelling on the lateral portion of his hip with a lot of bruising.  He diligently iced it for that first day.  He also had a little bit of bruising on his right knee and left upper arm.  The bruising has trailed on his leg but he still has a swelling at the point of injury  Approximately 2 days ago the patient saw a deer tick on his left lower abdomen and they were able to remove the tick.  He now has swelling of about 2 cm in range.  He denies any fever or chills.  He denies any neurologic symptoms at this time.  He does not know how long the tick had been there.  Past Medical History:  Diagnosis Date  . CAD (coronary artery disease)   . Colon polyps   . Dyslipidemia   . Herniated disc   . Hiatal hernia   . HTN (hypertension)   . Hyperlipidemia   . Kidney stones    Relevant past medical, surgical, family and social history reviewed and updated as indicated. Interim medical history since our last visit reviewed. Allergies and medications reviewed and updated. DATA REVIEWED: CHART IN EPIC  Family History reviewed for pertinent findings.  Review of Systems  Constitutional: Negative.  Negative for appetite change and fatigue.  Eyes: Negative for pain and visual disturbance.  Respiratory: Negative.  Negative for cough, chest tightness, shortness of breath and wheezing.   Cardiovascular: Negative.  Negative for chest pain, palpitations and leg swelling.  Gastrointestinal: Negative.  Negative for  abdominal pain, diarrhea, nausea and vomiting.  Genitourinary: Negative.   Musculoskeletal: Positive for arthralgias and joint swelling.  Skin: Positive for color change and wound. Negative for rash.  Neurological: Negative.  Negative for weakness, numbness and headaches.  Psychiatric/Behavioral: Negative.     Allergies as of 10/11/2017      Reactions   Levofloxacin    Caused C- Diff   Crestor [rosuvastatin] Other (See Comments)   Liver problems   Vytorin [ezetimibe-simvastatin] Other (See Comments)   Liver problems      Medication List        Accurate as of 10/11/17 10:52 AM. Always use your most recent med list.          aspirin 81 MG tablet Take 81 mg by mouth 2 (two) times daily.   doxycycline 100 MG tablet Commonly known as:  VIBRA-TABS Take 1 tablet (100 mg total) by mouth 2 (two) times daily. 1 po bid   Evolocumab 140 MG/ML Sosy Inject 140 mg into the skin every 14 (fourteen) days.   fluticasone 50 MCG/ACT nasal spray Commonly known as:  FLONASE USE 2 SPRAYS IN EACH NOSTRIL ONCE DAILY.   glucosamine-chondroitin 500-400 MG tablet Take 2 tablets by mouth daily.   meloxicam 15 MG tablet Commonly known as:  MOBIC TAKE (1) TABLET DAILY  AS DIRECTED.   metoprolol tartrate 25 MG tablet Commonly known as:  LOPRESSOR TAKE 1 TABLET IN THE MORNING AND 2 TABLETS IN THE EVENING   MULTI VITAMIN MENS PO Take 1 tablet by mouth 2 (two) times daily.   nitroGLYCERIN 0.4 MG SL tablet Commonly known as:  NITROSTAT Place 1 tablet (0.4 mg total) under the tongue every 5 (five) minutes as needed for chest pain.   omeprazole 20 MG capsule Commonly known as:  PRILOSEC Take 20 mg by mouth daily.   sildenafil 100 MG tablet Commonly known as:  VIAGRA Take 0.5-1 tablets (50-100 mg total) by mouth daily as needed for erectile dysfunction.   SUPER OMEGA-3 PO Take by mouth.   TURMERIC COMPLEX/BLACK PEPPER PO Take by mouth.   Vitamin D3 5000 units Caps Take 1 capsule by  mouth daily.          Objective:    BP 135/66   Pulse 68   Temp 98.4 F (36.9 C) (Oral)   Allergies  Allergen Reactions  . Levofloxacin     Caused C- Diff  . Crestor [Rosuvastatin] Other (See Comments)    Liver problems  . Vytorin [Ezetimibe-Simvastatin] Other (See Comments)    Liver problems    Wt Readings from Last 3 Encounters:  08/28/17 212 lb (96.2 kg)  06/26/17 216 lb (98 kg)  04/23/17 215 lb (97.5 kg)    Physical Exam  Constitutional: He appears well-developed and well-nourished. No distress.  HENT:  Head: Normocephalic and atraumatic.  Eyes: Pupils are equal, round, and reactive to light. Conjunctivae and EOM are normal.  Cardiovascular: Normal rate, regular rhythm and normal heart sounds.  Pulmonary/Chest: Effort normal and breath sounds normal. No respiratory distress.  Musculoskeletal:       Right hip: He exhibits tenderness and deformity. He exhibits normal range of motion and normal strength.       Legs: Bruising on the posterior leg  Skin: Skin is warm and dry. Lesion noted. There is erythema.     Psychiatric: He has a normal mood and affect. His behavior is normal.  Nursing note and vitals reviewed.       Assessment & Plan:   1. Contusion of right hip and thigh, initial encounter Reassure, stretches given  2. Tick bite of abdomen, initial encounter Doxycycline 100 mg 1 BID 10 days  3. Iliotibial band tendinitis of right side Reassure, stretches given   Continue all other maintenance medications as listed above.  Follow up plan: No follow-ups on file.  Educational handout given for hip exercises  Terald Sleeper PA-C [facility] [address]  [place], [place] [address] [phone]   10/11/2017, 10:52 AM

## 2017-11-10 ENCOUNTER — Other Ambulatory Visit: Payer: Self-pay | Admitting: Family Medicine

## 2017-12-15 ENCOUNTER — Other Ambulatory Visit: Payer: Self-pay | Admitting: Family Medicine

## 2017-12-17 ENCOUNTER — Telehealth: Payer: Self-pay | Admitting: Family Medicine

## 2017-12-17 NOTE — Telephone Encounter (Signed)
One sample of [place] provided.

## 2017-12-23 ENCOUNTER — Ambulatory Visit (INDEPENDENT_AMBULATORY_CARE_PROVIDER_SITE_OTHER): Payer: Medicare Other

## 2017-12-23 DIAGNOSIS — Z23 Encounter for immunization: Secondary | ICD-10-CM | POA: Diagnosis not present

## 2017-12-29 ENCOUNTER — Other Ambulatory Visit: Payer: Self-pay | Admitting: *Deleted

## 2017-12-29 DIAGNOSIS — Z8052 Family history of malignant neoplasm of bladder: Secondary | ICD-10-CM

## 2017-12-29 DIAGNOSIS — E785 Hyperlipidemia, unspecified: Secondary | ICD-10-CM

## 2017-12-29 DIAGNOSIS — D696 Thrombocytopenia, unspecified: Secondary | ICD-10-CM

## 2017-12-29 DIAGNOSIS — I10 Essential (primary) hypertension: Secondary | ICD-10-CM

## 2017-12-29 DIAGNOSIS — E559 Vitamin D deficiency, unspecified: Secondary | ICD-10-CM

## 2017-12-30 ENCOUNTER — Other Ambulatory Visit: Payer: Medicare Other

## 2017-12-30 DIAGNOSIS — Z8052 Family history of malignant neoplasm of bladder: Secondary | ICD-10-CM | POA: Diagnosis not present

## 2017-12-30 DIAGNOSIS — E559 Vitamin D deficiency, unspecified: Secondary | ICD-10-CM

## 2017-12-30 DIAGNOSIS — E785 Hyperlipidemia, unspecified: Secondary | ICD-10-CM | POA: Diagnosis not present

## 2017-12-30 DIAGNOSIS — I10 Essential (primary) hypertension: Secondary | ICD-10-CM | POA: Diagnosis not present

## 2017-12-30 DIAGNOSIS — D696 Thrombocytopenia, unspecified: Secondary | ICD-10-CM

## 2017-12-31 LAB — CBC WITH DIFFERENTIAL/PLATELET
BASOS: 1 %
Basophils Absolute: 0.1 10*3/uL (ref 0.0–0.2)
EOS (ABSOLUTE): 0 10*3/uL (ref 0.0–0.4)
EOS: 1 %
HEMATOCRIT: 41.8 % (ref 37.5–51.0)
Hemoglobin: 13.9 g/dL (ref 13.0–17.7)
Immature Grans (Abs): 0 10*3/uL (ref 0.0–0.1)
Immature Granulocytes: 0 %
LYMPHS ABS: 1.6 10*3/uL (ref 0.7–3.1)
Lymphs: 21 %
MCH: 30.2 pg (ref 26.6–33.0)
MCHC: 33.3 g/dL (ref 31.5–35.7)
MCV: 91 fL (ref 79–97)
MONOS ABS: 0.4 10*3/uL (ref 0.1–0.9)
Monocytes: 6 %
NEUTROS ABS: 5.3 10*3/uL (ref 1.4–7.0)
Neutrophils: 71 %
Platelets: 172 10*3/uL (ref 150–450)
RBC: 4.6 x10E6/uL (ref 4.14–5.80)
RDW: 12.2 % — AB (ref 12.3–15.4)
WBC: 7.4 10*3/uL (ref 3.4–10.8)

## 2017-12-31 LAB — HEPATIC FUNCTION PANEL
ALBUMIN: 4.5 g/dL (ref 3.5–4.8)
ALK PHOS: 71 IU/L (ref 39–117)
ALT: 16 IU/L (ref 0–44)
AST: 22 IU/L (ref 0–40)
BILIRUBIN TOTAL: 0.6 mg/dL (ref 0.0–1.2)
BILIRUBIN, DIRECT: 0.17 mg/dL (ref 0.00–0.40)
Total Protein: 6.9 g/dL (ref 6.0–8.5)

## 2017-12-31 LAB — NMR, LIPOPROFILE
Cholesterol, Total: 130 mg/dL (ref 100–199)
HDL Particle Number: 32.3 umol/L (ref >=30.5)
HDL-C: 47 mg/dL (ref >39)
LDL PARTICLE NUMBER: 750 nmol/L (ref <1000)
LDL Size: 20.7 nm (ref >20.5)
LDL-C: 67 mg/dL (ref 0–99)
LP-IR Score: 56 — ABNORMAL HIGH (ref <=45)
SMALL LDL PARTICLE NUMBER: 319 nmol/L (ref <=527)
TRIGLYCERIDES: 82 mg/dL (ref 0–149)

## 2017-12-31 LAB — BMP8+EGFR
BUN / CREAT RATIO: 19 (ref 10–24)
BUN: 17 mg/dL (ref 8–27)
CO2: 24 mmol/L (ref 20–29)
CREATININE: 0.9 mg/dL (ref 0.76–1.27)
Calcium: 9.8 mg/dL (ref 8.6–10.2)
Chloride: 107 mmol/L — ABNORMAL HIGH (ref 96–106)
GFR, EST AFRICAN AMERICAN: 96 mL/min/{1.73_m2} (ref >59)
GFR, EST NON AFRICAN AMERICAN: 83 mL/min/{1.73_m2} (ref >59)
Glucose: 97 mg/dL (ref 65–99)
Potassium: 4.6 mmol/L (ref 3.5–5.2)
SODIUM: 145 mmol/L — AB (ref 134–144)

## 2017-12-31 LAB — VITAMIN D 25 HYDROXY (VIT D DEFICIENCY, FRACTURES): Vit D, 25-Hydroxy: 72.1 ng/mL (ref 30.0–100.0)

## 2018-01-06 ENCOUNTER — Ambulatory Visit (INDEPENDENT_AMBULATORY_CARE_PROVIDER_SITE_OTHER): Payer: Medicare Other | Admitting: Family Medicine

## 2018-01-06 ENCOUNTER — Encounter: Payer: Self-pay | Admitting: Family Medicine

## 2018-01-06 VITALS — BP 140/73 | HR 62 | Temp 98.9°F | Ht 73.0 in | Wt 214.0 lb

## 2018-01-06 DIAGNOSIS — E785 Hyperlipidemia, unspecified: Secondary | ICD-10-CM | POA: Diagnosis not present

## 2018-01-06 DIAGNOSIS — R29898 Other symptoms and signs involving the musculoskeletal system: Secondary | ICD-10-CM | POA: Diagnosis not present

## 2018-01-06 DIAGNOSIS — Z8052 Family history of malignant neoplasm of bladder: Secondary | ICD-10-CM

## 2018-01-06 DIAGNOSIS — E559 Vitamin D deficiency, unspecified: Secondary | ICD-10-CM | POA: Diagnosis not present

## 2018-01-06 DIAGNOSIS — H6121 Impacted cerumen, right ear: Secondary | ICD-10-CM | POA: Diagnosis not present

## 2018-01-06 DIAGNOSIS — I10 Essential (primary) hypertension: Secondary | ICD-10-CM

## 2018-01-06 DIAGNOSIS — M5440 Lumbago with sciatica, unspecified side: Secondary | ICD-10-CM

## 2018-01-06 DIAGNOSIS — R6889 Other general symptoms and signs: Secondary | ICD-10-CM | POA: Diagnosis not present

## 2018-01-06 DIAGNOSIS — D696 Thrombocytopenia, unspecified: Secondary | ICD-10-CM

## 2018-01-06 LAB — URINALYSIS, COMPLETE
BILIRUBIN UA: NEGATIVE
Glucose, UA: NEGATIVE
Ketones, UA: NEGATIVE
Leukocytes, UA: NEGATIVE
NITRITE UA: NEGATIVE
PH UA: 5 (ref 5.0–7.5)
Protein, UA: NEGATIVE
Specific Gravity, UA: 1.02 (ref 1.005–1.030)
UUROB: 0.2 mg/dL (ref 0.2–1.0)

## 2018-01-06 LAB — MICROSCOPIC EXAMINATION
Bacteria, UA: NONE SEEN
EPITHELIAL CELLS (NON RENAL): NONE SEEN /HPF (ref 0–10)
RENAL EPITHEL UA: NONE SEEN /HPF
WBC UA: NONE SEEN /HPF (ref 0–5)

## 2018-01-06 NOTE — Progress Notes (Signed)
Subjective:    Patient ID: [name] [name], male    DOB: [birth year], 77 y.o.   MRN: [id number]  HPI Pt here for follow up and management of chronic medical problems which includes hyperlipidemia and hypertension. He is taking medication regularly.  Patient today is complaining of right sided abdominal and growing pain which seems to be worse at nighttime.  He also complains of his legs getting tired and weak.  He will be given an FOBT to return.  He has had lab work and this will be reviewed with him during the visit today.  He will also give [place] a urine specimen today.  The CBC was within normal limits with a good hemoglobin at 13.9, normal white blood cell count and adequate platelet count.  All cholesterol numbers with advanced lipid testing were excellent at goal with an LDL particle number being 750 and LDL C being good at 67 and HDL being good at 32.3 which is the particle number and triglycerides good at 82.  He is on Repatha.  He should continue with this.  All liver function tests are within normal limits.  The vitamin D level is excellent at 72.1.  The blood sugar was good at 97.  The creatinine good at 0.9.  Electrolytes are good except the sodium and chloride were slightly increased.  Potassium was normal.  It is important to note that the patient did have x-rays of his lumbar spine in February of this year and this showed no acute fracture or malalignment.  It did show lumbar spondylosis which is mild at L3 and 4 and L4 and 5 but moderate at L5-S1.  Because of his complaints.  It is important that we check a urine specimen which will be done.  Because of his extremity weakness, we should make sure we get a thyroid and B12 level.    Patient Active Problem List   Diagnosis Date Noted  . Osteoarthritis of knee 05/08/2017  . Tinnitus of both ears 09/10/2016  . Ventral hernia without obstruction or gangrene 03/28/2016  . AAA (abdominal aortic aneurysm) without rupture ([place]) 07/20/2015  .  Obstructive sleep apnea 03/17/2014  . Erectile dysfunction due to arterial insufficiency 01/12/2014  . Thrombocytopenia ([place]) 12/30/2013  . Allergic rhinitis 10/01/2012  . BPH (benign prostatic hyperplasia) 10/01/2012  . PVC's (premature ventricular contractions) 09/01/2009  . COLONIC POLYPS 04/14/2009  . Hyperlipidemia LDL goal <70 04/14/2009  . Essential hypertension 04/14/2009  . Coronary atherosclerosis 04/14/2009  . HIATAL HERNIA 04/14/2009  . HERNIATED DISC 04/14/2009   Outpatient Encounter Medications as of 01/06/2018  Medication Sig  . aspirin 81 MG tablet Take 81 mg by mouth 2 (two) times daily.   . Black Pepper-Turmeric (TURMERIC COMPLEX/BLACK PEPPER PO) Take by mouth.  . Cholecalciferol (VITAMIN D3) 5000 UNITS CAPS Take 1 capsule by mouth daily.  . Evolocumab (REPATHA) 140 MG/ML SOSY Inject 140 mg into the skin every 14 (fourteen) days.  . fluticasone (FLONASE) 50 MCG/ACT nasal spray USE 2 SPRAYS IN EACH NOSTRIL ONCE DAILY.  Marland Kitchen glucosamine-chondroitin 500-400 MG tablet Take 2 tablets by mouth daily.  . meloxicam (MOBIC) 15 MG tablet TAKE (1) TABLET DAILY AS DIRECTED.  Marland Kitchen metoprolol tartrate (LOPRESSOR) 25 MG tablet TAKE 1 TABLET IN THE MORNING AND 2 TABLETS IN THE EVENING  . Multiple Vitamin (MULTI VITAMIN MENS PO) Take 1 tablet by mouth 2 (two) times daily.  . Omega-3 Fatty Acids (SUPER OMEGA-3 PO) Take by mouth.  Marland Kitchen omeprazole (PRILOSEC) 20 MG  capsule Take 20 mg by mouth daily.  . sildenafil (VIAGRA) 100 MG tablet Take 0.5-1 tablets (50-100 mg total) by mouth daily as needed for erectile dysfunction.  . nitroGLYCERIN (NITROSTAT) 0.4 MG SL tablet Place 1 tablet (0.4 mg total) under the tongue every 5 (five) minutes as needed for chest pain.  . [DISCONTINUED] doxycycline (VIBRA-TABS) 100 MG tablet Take 1 tablet (100 mg total) by mouth 2 (two) times daily. 1 po bid  . [DISCONTINUED] fluticasone (FLONASE) 50 MCG/ACT nasal spray USE 2 SPRAYS IN EACH NOSTRIL ONCE DAILY.   No  facility-administered encounter medications on file as of 01/06/2018.      Review of Systems  Constitutional: Negative.   HENT: Negative.   Eyes: Negative.   Respiratory: Negative.   Cardiovascular: Negative.   Gastrointestinal: Positive for abdominal pain (right side abd pain - worse at night ).  Endocrine: Negative.   Genitourinary: Negative.   Musculoskeletal: Positive for myalgias (at times and some extremity weakness).  Skin: Negative.   Allergic/Immunologic: Negative.   Neurological: Negative.   Hematological: Negative.   Psychiatric/Behavioral: Negative.        Objective:   Physical Exam  Constitutional: He is oriented to person, place, and time. He appears well-developed and well-nourished. No distress.  The patient is pleasant and alert.  HENT:  Head: Normocephalic and atraumatic.  Left Ear: External ear normal.  Nose: Nose normal.  Mouth/Throat: Oropharynx is clear and moist. No oropharyngeal exudate.  Ear cerumen right  Eyes: Pupils are equal, round, and reactive to light. Conjunctivae and EOM are normal. Right eye exhibits no discharge. Left eye exhibits no discharge. No scleral icterus.  Neck: Normal range of motion. Neck supple. No thyromegaly present.  No bruits thyromegaly or anterior cervical adenopathy  Cardiovascular: Normal rate, regular rhythm, normal heart sounds and intact distal pulses.  No murmur heard. Heart is regular at 60/min  Pulmonary/Chest: Effort normal and breath sounds normal. No respiratory distress. He has no wheezes. He has no rales. He exhibits no tenderness.  Clear anteriorly and posteriorly and no axillary adenopathy and no chest wall masses or tenderness.  Abdominal: Soft. Bowel sounds are normal. He exhibits no mass. There is no tenderness.  Slight epigastric tenderness.  Slight bulging at the bottom of the chest wall incision for the bypass surgery.  No liver or spleen enlargement no masses no bruits and no inguinal adenopathy    Genitourinary: Penis normal.  Genitourinary Comments: The genitalia were checked today and there were no inguinal hernias noted on either side.  There is no bulging with tightening of the abdominal muscles.  No abnormality in the region where the pain was noted.  Musculoskeletal: Normal range of motion. He exhibits no edema or tenderness.  Leg raising was good bilaterally and no sign of any rash or point tenderness especially in the right lower quadrant.  Good hip abduction and abduction bilaterally good leg raising bilaterally without reproduction of pain.  Lymphadenopathy:    He has no cervical adenopathy.  Neurological: He is alert and oriented to person, place, and time. He has normal reflexes. No cranial nerve deficit.  Reflexes were 2+ and equal bilaterally  Skin: Skin is warm and dry. No rash noted.  Psychiatric: He has a normal mood and affect. His behavior is normal. Judgment and thought content normal.  The patient's mood affect and behavior were all normal.  Nursing note and vitals reviewed.  BP 140/73 (BP Location: Left Arm)   Pulse 62   Temp 98.9  F (37.2 C) (Oral)   Ht 6\' 1"  (1.854 m)   Wt 214 lb (97.1 kg)   BMI 28.23 kg/m        Assessment & Plan:  1. Hyperlipidemia LDL goal <70 -Continue with Repatha  2. Family history of bladder cancer -Continue to check urinalysis twice yearly - Urinalysis, Complete  3. Thrombocytopenia ([place]) -The most recent platelet count was normal.  4. Vitamin D deficiency -Continue with vitamin D replacement  5. Essential hypertension -Blood pressure is good and continue with current treatment  6. Leg weakness, bilateral -Schedule visit with orthopedist for further follow-up if not better in 4 weeks.  In the meantime take Tylenol use warm wet compresses and try to lay on the left side instead of the right side. - Vitamin B12 - Thyroid Panel With TSH  7.  Ear cerumen right ear canal -Irrigation to remove cerumen  No orders of  the defined types were placed in this encounter.  Patient Instructions                       Medicare Annual Wellness Visit  [facility] and the medical providers at [facility] strive to bring you the best medical care.  In doing so we not only want to address your current medical conditions and concerns but also to detect new conditions early and prevent illness, disease and health-related problems.    Medicare offers a yearly Wellness Visit which allows our clinical staff to assess your need for preventative services including immunizations, lifestyle education, counseling to decrease risk of preventable diseases and screening for fall risk and other medical concerns.    This visit is provided free of charge (no copay) for all Medicare recipients. The clinical pharmacists at [facility] have begun to conduct these Wellness Visits which will also include a thorough review of all your medications.    As you primary medical provider recommend that you make an appointment for your Annual Wellness Visit if you have not done so already this year.  You may set up this appointment before you leave today or you may call back ([phone]) and schedule an appointment.  Please make sure when you call that you mention that you are scheduling your Annual Wellness Visit with the clinical pharmacist so that the appointment may be made for the proper length of time.     Continue current medications. Continue good therapeutic lifestyle changes which include good diet and exercise. Fall precautions discussed with patient. If an FOBT was given today- please return it to our front desk. If you are over 45 years old - you may need Prevnar 52 or the adult Pneumonia vaccine.  **Flu shots are available--- please call and schedule a FLU-CLINIC appointment**  After your visit with [place] today you will receive a survey in the mail or online from [employer] regarding your care  with [place]. Please take a moment to fill this out. Your feedback is very important to [place] as you can help [place] better understand your patient needs as well as improve your experience and satisfaction. WE CARE ABOUT YOU!!!   We will add additional test to your recent blood work because of the lower extremity weakness.  We will add a thyroid panel and a B12 level. Do not forget to call the cardiologist about your yearly follow-up visit Remember that your last colonoscopy was done in April 2017.  If everything was fine you may not  need another colonoscopy.  You should check with the gastroenterologist about any future colonoscopies that may be needed especially at the 5-year mark which would be in 2022. Continue to be careful and do not put yourself at risk for falling Check with her girls here to see if we have an extra dose of Repatha for you to get you through the year.  Arrie Senate MD

## 2018-01-06 NOTE — Patient Instructions (Addendum)
Medicare Annual Wellness Visit  [facility] and the medical providers at [facility] strive to bring you the best medical care.  In doing so we not only want to address your current medical conditions and concerns but also to detect new conditions early and prevent illness, disease and health-related problems.    Medicare offers a yearly Wellness Visit which allows our clinical staff to assess your need for preventative services including immunizations, lifestyle education, counseling to decrease risk of preventable diseases and screening for fall risk and other medical concerns.    This visit is provided free of charge (no copay) for all Medicare recipients. The clinical pharmacists at [facility] have begun to conduct these Wellness Visits which will also include a thorough review of all your medications.    As you primary medical provider recommend that you make an appointment for your Annual Wellness Visit if you have not done so already this year.  You may set up this appointment before you leave today or you may call back ([phone]) and schedule an appointment.  Please make sure when you call that you mention that you are scheduling your Annual Wellness Visit with the clinical pharmacist so that the appointment may be made for the proper length of time.     Continue current medications. Continue good therapeutic lifestyle changes which include good diet and exercise. Fall precautions discussed with patient. If an FOBT was given today- please return it to our front desk. If you are over 85 years old - you may need Prevnar 53 or the adult Pneumonia vaccine.  **Flu shots are available--- please call and schedule a FLU-CLINIC appointment**  After your visit with [place] today you will receive a survey in the mail or online from [employer] regarding your care with [place]. Please take a moment to fill this out. Your feedback is very  important to [place] as you can help [place] better understand your patient needs as well as improve your experience and satisfaction. WE CARE ABOUT YOU!!!   We will add additional test to your recent blood work because of the lower extremity weakness.  We will add a thyroid panel and a B12 level. Do not forget to call the cardiologist about your yearly follow-up visit Remember that your last colonoscopy was done in April 2017.  If everything was fine you may not need another colonoscopy.  You should check with the gastroenterologist about any future colonoscopies that may be needed especially at the 5-year mark which would be in 2022. Continue to be careful and do not put yourself at risk for falling Check with her girls here to see if we have an extra dose of Repatha for you to get you through the year.

## 2018-01-06 NOTE — Addendum Note (Signed)
Addended by: Zannie Cove on: 01/06/2018 10:38 AM   Modules accepted: Orders

## 2018-01-07 LAB — VITAMIN B12: Vitamin B-12: 1169 pg/mL (ref 232–1245)

## 2018-01-07 LAB — THYROID PANEL WITH TSH
Free Thyroxine Index: 2.2 (ref 1.2–4.9)
T3 UPTAKE RATIO: 32 % (ref 24–39)
T4 TOTAL: 6.9 ug/dL (ref 4.5–12.0)
TSH: 2.09 u[IU]/mL (ref 0.450–4.500)

## 2018-01-14 ENCOUNTER — Other Ambulatory Visit: Payer: Medicare Other

## 2018-01-14 DIAGNOSIS — Z1211 Encounter for screening for malignant neoplasm of colon: Secondary | ICD-10-CM | POA: Diagnosis not present

## 2018-01-15 LAB — FECAL OCCULT BLOOD, IMMUNOCHEMICAL: Fecal Occult Bld: NEGATIVE

## 2018-02-05 DIAGNOSIS — M1611 Unilateral primary osteoarthritis, right hip: Secondary | ICD-10-CM | POA: Diagnosis not present

## 2018-02-05 DIAGNOSIS — M25551 Pain in right hip: Secondary | ICD-10-CM | POA: Diagnosis not present

## 2018-02-05 DIAGNOSIS — M170 Bilateral primary osteoarthritis of knee: Secondary | ICD-10-CM | POA: Diagnosis not present

## 2018-02-05 DIAGNOSIS — S7001XA Contusion of right hip, initial encounter: Secondary | ICD-10-CM | POA: Diagnosis not present

## 2018-02-09 ENCOUNTER — Other Ambulatory Visit: Payer: Self-pay | Admitting: Family Medicine

## 2018-02-09 ENCOUNTER — Other Ambulatory Visit: Payer: Self-pay | Admitting: *Deleted

## 2018-02-09 MED ORDER — EVOLOCUMAB 140 MG/ML ~~LOC~~ SOSY: 140.0000 mg | PREFILLED_SYRINGE | SUBCUTANEOUS | 3 refills | Status: DC

## 2018-02-09 NOTE — Progress Notes (Signed)
Pt requesting refill of Repatha Refill okay per Dr Laurance Flatten

## 2018-02-11 ENCOUNTER — Telehealth: Payer: Self-pay | Admitting: *Deleted

## 2018-02-11 NOTE — Telephone Encounter (Signed)
   [facility] Pre-operative Risk Assessment    Request for surgical clearance:  1. What type of surgery is being performed? R total knee   2. When is this surgery scheduled? 04/27/18   3. What type of clearance is required (medical clearance vs. Pharmacy clearance to hold med vs. Both)? both  4. Are there any medications that need to be held prior to surgery and how long?ASA   5. Practice name and name of physician performing surgery? [facility] Dr. Wynelle Link   6. What is your office phone number [phone]    7.   What is your office fax number [phone] attention: [name] [name]  8.   Anesthesia type (None, local, MAC, general) ? Choice   [name] [name] 02/11/2018, 8:46 AM  _________________________________________________________________   (provider comments below)

## 2018-02-17 NOTE — Telephone Encounter (Signed)
   Primary Cardiologist:James Hochrein, MD  Chart reviewed as part of pre-operative protocol coverage. Because of [name] [name]'s past medical history and time since last visit, he/she will require a follow-up visit in order to better assess preoperative cardiovascular risk.  Pre-op covering staff: - Please schedule appointment and call patient to inform them. - Please contact requesting surgeon's office via preferred method (i.e, phone, fax) to inform them of need for appointment prior to surgery.  If applicable, this message will also be routed to pharmacy pool and/or primary cardiologist for input on holding anticoagulant/antiplatelet agent as requested below so that this information is available at time of patient's appointment.   Cecilie Kicks, NP  02/17/2018, 3:13 PM

## 2018-02-17 NOTE — Telephone Encounter (Signed)
Pt is scheduled to see Dr.Hochrein on 03/17/18. Clearance will be addressed at visit I have notified requesting office.

## 2018-03-15 NOTE — Progress Notes (Signed)
.    HPI The patient presents for evaluation of CAD/CABG. Since I last saw him he has has done well.  He is going to have his knee replaced in February.  This has limited his activities though he still can [place].  He can still do a little bit of exercise and is not doing this as routinely.  The patient denies any new symptoms such as chest discomfort, neck or arm discomfort. There has been no new shortness of breath, PND or orthopnea. There have been no reported palpitations, presyncope or syncope.   Allergies  Allergen Reactions  . Levofloxacin     Caused C- Diff  . Crestor [Rosuvastatin] Other (See Comments)    Liver problems  . Vytorin [Ezetimibe-Simvastatin] Other (See Comments)    Liver problems    Current Outpatient Medications  Medication Sig Dispense Refill  . aspirin 81 MG tablet Take 81 mg by mouth 2 (two) times daily.     . Black Pepper-Turmeric (TURMERIC COMPLEX/BLACK PEPPER PO) Take by mouth.    . Cholecalciferol (VITAMIN D3) 5000 UNITS CAPS Take 1 capsule by mouth daily.    . Evolocumab (REPATHA) 140 MG/ML SOSY Inject 140 mg into the skin every 14 (fourteen) days. 6 Syringe 3  . glucosamine-chondroitin 500-400 MG tablet Take 2 tablets by mouth daily.    . meloxicam (MOBIC) 15 MG tablet TAKE (1) TABLET DAILY AS DIRECTED. 30 tablet 1  . metoprolol tartrate (LOPRESSOR) 25 MG tablet TAKE 1 TABLET IN THE MORNING AND 2 TABLETS IN THE EVENING 270 tablet 2  . Multiple Vitamin (MULTI VITAMIN MENS PO) Take 1 tablet by mouth 2 (two) times daily.    . Omega-3 Fatty Acids (SUPER OMEGA-3 PO) Take by mouth.    Marland Kitchen omeprazole (PRILOSEC) 20 MG capsule Take 20 mg by mouth daily.    . nitroGLYCERIN (NITROSTAT) 0.4 MG SL tablet Place 1 tablet (0.4 mg total) under the tongue every 5 (five) minutes as needed for chest pain. 25 tablet prn   No current facility-administered medications for this visit.     Past Medical History:  Diagnosis Date  . CAD (coronary  artery disease)   . Colon polyps   . Dyslipidemia   . Herniated disc   . Hiatal hernia   . HTN (hypertension)   . Hyperlipidemia   . Kidney stones     Past Surgical History:  Procedure Laterality Date  . CORONARY ARTERY BYPASS GRAFT  2006   LIMA to LAD, SVG to RCA, SVG to circumflex, SVG to diagonal   . mole removed  2003  . right knee surgery  2008  . TONSILLECTOMY      ROS:    As stated in the HPI and negative for all other systems.   PHYSICAL EXAM BP (!) 124/58   Pulse (!) 59   Ht 6\' 1"  (1.854 m)   Wt 217 lb (98.4 kg)   BMI 28.63 kg/m   GENERAL:  Well appearing NECK:  No jugular venous distention, waveform within normal limits, carotid upstroke brisk and symmetric, no bruits, no thyromegaly LUNGS:  Clear to auscultation bilaterally CHEST:  Well healed sternotomy scar. HEART:  PMI not displaced or sustained,S1 and S2 within normal limits, no S3, no S4, no clicks, no rubs, 2 out of 6 apical murmur radiating slightly to the axilla and slightly holosystolic, no diastolic murmurs ABD:  Flat, positive bowel sounds normal in frequency in pitch, no bruits, no rebound, no  guarding, no midline pulsatile mass, no hepatomegaly, no splenomegaly EXT:  2 plus pulses throughout, no edema, no cyanosis no clubbing    EKG:  Sinus rhythm, rate 59, axis within normal limits, intervals within normal limits, no acute ST-T wave changes.   ASSESSMENT AND PLAN  PREOP:   The patient has a good functional level.  He had a negative stress test in 2018 October.  He has no new symptoms since then.  He is not going for high risk procedure.  Therefore, according to ACC/AHA guidelines the patient is at acceptable risk for the planned procedure.  No further testing is indicated.  AAA:  3.2 cm this year.    He will have this followed in June.    CAD:   I sent him for a POET (Plain Old Exercise Treadmill) last year and this was negative.    He will continue with risk reduction.  HTN:  The blood  pressure is at target.  No change in therapy.   DYSLIPIDEMIA:   His LDL most recently was 67 with an HDL of 47.  We will continue the meds as listed.    SLEEP APNEA:  This is being treated.    MURMUR: I have not appreciated this before.  I will check an echocardiogram.

## 2018-03-17 ENCOUNTER — Ambulatory Visit (INDEPENDENT_AMBULATORY_CARE_PROVIDER_SITE_OTHER): Payer: Medicare Other | Admitting: Cardiology

## 2018-03-17 ENCOUNTER — Encounter: Payer: Self-pay | Admitting: Cardiology

## 2018-03-17 VITALS — BP 124/58 | HR 59 | Ht 73.0 in | Wt 217.0 lb

## 2018-03-17 DIAGNOSIS — R011 Cardiac murmur, unspecified: Secondary | ICD-10-CM | POA: Diagnosis not present

## 2018-03-17 DIAGNOSIS — M1711 Unilateral primary osteoarthritis, right knee: Secondary | ICD-10-CM | POA: Diagnosis not present

## 2018-03-17 DIAGNOSIS — I2510 Atherosclerotic heart disease of native coronary artery without angina pectoris: Secondary | ICD-10-CM | POA: Diagnosis not present

## 2018-03-17 DIAGNOSIS — E785 Hyperlipidemia, unspecified: Secondary | ICD-10-CM | POA: Diagnosis not present

## 2018-03-17 DIAGNOSIS — I714 Abdominal aortic aneurysm, without rupture, unspecified: Secondary | ICD-10-CM

## 2018-03-17 NOTE — Patient Instructions (Signed)
Medication Instructions:  Continue current medication.  If you need a refill on your cardiac medications before your next appointment, please call your pharmacy.  Labwork: none   Take the provided lab slips with you to the lab for your blood draw.  When you have your labs (blood work) drawn today and your tests are completely normal, you will receive your results only by MyChart Message (if you have MyChart) -OR-  A paper copy in the mail.  If you have any lab test that is abnormal or we need to change your treatment, we will call you to review these results.  Testing/Procedures: Your physician has requested that you have an echocardiogram. Echocardiography is a painless test that uses sound waves to create images of your heart. It provides your doctor with information about the size and shape of your heart and how well your heart's chambers and valves are working. This procedure takes approximately one hour. There are no restrictions for this procedure.  Your physician has requested that you have an abdominal aorta duplex. During this test, an ultrasound is used to evaluate the aorta. Allow 30 minutes for this exam. Do not eat after midnight the day before and avoid carbonated beverages   Follow-Up: You will need a follow up appointment in 1 Year.  Please call our office 2 months in advance to schedule this appointment.  You may see Minus Breeding, MD or one of the following Advanced Practice Providers on your designated Care Team:   Rosaria Ferries, PA-C . Jory Sims, DNP, ANP  At [facility], you and your health needs are our priority.  As part of our continuing mission to provide you with exceptional heart care, we have created designated Provider Care Teams.  These Care Teams include your primary Cardiologist (physician) and Advanced Practice Providers (APPs -  Physician Assistants and Nurse Practitioners) who all work together to provide you with the care you need, when you need  it.  Thank you for choosing [facility] at [facility]!!

## 2018-03-24 ENCOUNTER — Ambulatory Visit (HOSPITAL_COMMUNITY): Payer: Medicare Other | Attending: Cardiovascular Disease

## 2018-03-24 ENCOUNTER — Other Ambulatory Visit: Payer: Self-pay

## 2018-03-24 DIAGNOSIS — R011 Cardiac murmur, unspecified: Secondary | ICD-10-CM | POA: Insufficient documentation

## 2018-04-02 ENCOUNTER — Ambulatory Visit (INDEPENDENT_AMBULATORY_CARE_PROVIDER_SITE_OTHER): Payer: Medicare Other | Admitting: Internal Medicine

## 2018-04-02 ENCOUNTER — Encounter: Payer: Self-pay | Admitting: Internal Medicine

## 2018-04-02 VITALS — BP 144/72 | HR 62 | Ht 73.0 in | Wt 221.0 lb

## 2018-04-02 DIAGNOSIS — I2510 Atherosclerotic heart disease of native coronary artery without angina pectoris: Secondary | ICD-10-CM

## 2018-04-02 DIAGNOSIS — M170 Bilateral primary osteoarthritis of knee: Secondary | ICD-10-CM

## 2018-04-02 DIAGNOSIS — G4733 Obstructive sleep apnea (adult) (pediatric): Secondary | ICD-10-CM

## 2018-04-02 NOTE — Patient Instructions (Addendum)
We can continue CPAP auto 5-12, mask of choice, humidifier, supplies, AirView  Please call if we can help

## 2018-04-02 NOTE — Progress Notes (Signed)
HPI male never smoker followed for OSA, complicated by CAD/CABG, HBP, hiatal hernia Unattended Home Sleep Test 05/09/14- AHI 24.9/ hr, desat to 85%, body weight 208 pounds Unattended Home Sleep Test 08/02/15-AHI 11.3/hour, desaturation to 76%, body weight 212.5 pounds ----------------------------------------------------------- 04/01/17- 78 year old male never smoker followed for OSA, complicated by CAD/CABG, HBP, hiatal hernia CPAP auto 5-12/Advanced -----OSA; DME [facility]. Pt wears CPAP nightly and DL attached. No new supplies needed at this time.  Download 93% compliance, AHI 1.8/hour.  He definitely sleeps better with CPAP.  Follows results with an app on his phone.  Now using a So Clean machine.  04/02/2018- 78 year old male never smoker followed for OSA, complicated by CAD/CABG, HBP, hiatal hernia CPAP auto 5-12/Advanced Download compliance 97%, AHI 1.2/ hr  -----OSA-doing well Echocardiogram attributed systolic murmur to mild TR/ Dr Percival Spanish Body weight today 221 pounds Download 97% compliance AHI 1.2/hour He is satisfied that he is better off w CPAP. Pending TKR. Denies other interval medical problems of concern for this visit.  ROS-see HPI     + = positive Constitutional:   No-   weight loss, night sweats, fevers, chills, fatigue, lassitude. HEENT:   No-  headaches, difficulty swallowing, tooth/dental problems, sore throat,       No-  sneezing, itching, ear ache, nasal congestion, post nasal drip,  CV:  No-   chest pain, orthopnea, PND, swelling in lower extremities, anasarca,                                                    dizziness, palpitations Resp: No-   shortness of breath with exertion or at rest.              No-   productive cough,  No non-productive cough,  No- coughing up of blood.              No-   change in color of mucus.  No- wheezing.   Skin: No-   rash or lesions. GI:  No-   heartburn, indigestion, abdominal pain, nausea, vomiting, diarrhea,                 change in  bowel habits, loss of appetite GU: No-   dysuria, change in color of urine, no urgency or frequency.  No- flank pain. MS:  + joint pain or swelling.  No- decreased range of motion.  No- back pain. Neuro-     nothing unusual Psych:  No- change in mood or affect. No depression or anxiety.  No memory loss.  OBJ- Physical Exam   + = positive General- Alert, Oriented, Affect-appropriate, Distress- none acute, tall, not obese Skin- rash-none, lesions- none, excoriation- none Lymphadenopathy- none Head- atraumatic            Eyes- Gross vision intact, PERRLA, conjunctivae and secretions clear            Ears- Hearing, canals-normal            Nose- Clear, no-Septal dev, mucus, polyps, erosion, perforation             Throat- Mallampati II-III , mucosa clear , drainage- none, tonsils- atrophic Neck- flexible , trachea midline, no stridor , thyroid nl, carotid no bruit Chest - symmetrical excursion , unlabored           Heart/CV- RRR ,  Murmur+  TR , no gallop  , no rub, nl s1 s2                           - JVD- none , edema- none, stasis changes- none, varices- none           Lung- clear to P&A, wheeze- none, cough- none , dullness-none, rub- none           Chest wall-  Abd- tender-no, distended-no, bowel sounds-present, HSM- no Br/ Gen/ Rectal- Not done, not indicated Extrem- cyanosis- none, clubbing, none, atrophy- none, strength- nl Neuro- grossly intact to observation.

## 2018-04-15 NOTE — H&P (Signed)
TOTAL KNEE ADMISSION H&P  Patient is being admitted for right total knee arthroplasty.  Subjective:  Chief Complaint:right knee pain.  HPI: [name] [name], 78 y.o. male, has a history of pain and functional disability in the right knee due to arthritis and has failed non-surgical conservative treatments for greater than 12 weeks to includecorticosteriod injections, viscosupplementation injections and activity modification.  Onset of symptoms was gradual, starting several years ago with gradually worsening course since that time. The patient noted prior procedures on the knee to include  arthroscopy on the right knee(s).  Patient currently rates pain in the right knee(s) at 8 out of 10 with activity. Patient has worsening of pain with activity and weight bearing, crepitus and joint swelling.  Patient has evidence of bone-on-bone arthritis in the medial and patellofemoral compartments by imaging studies. There is no active infection.  Patient Active Problem List   Diagnosis Date Noted  . Osteoarthritis of knee 05/08/2017  . Tinnitus of both ears 09/10/2016  . Ventral hernia without obstruction or gangrene 03/28/2016  . AAA (abdominal aortic aneurysm) without rupture ([place]) 07/20/2015  . Obstructive sleep apnea 03/17/2014  . Erectile dysfunction due to arterial insufficiency 01/12/2014  . Thrombocytopenia ([place]) 12/30/2013  . Allergic rhinitis 10/01/2012  . BPH (benign prostatic hyperplasia) 10/01/2012  . PVC's (premature ventricular contractions) 09/01/2009  . COLONIC POLYPS 04/14/2009  . Hyperlipidemia LDL goal <70 04/14/2009  . Essential hypertension 04/14/2009  . Coronary atherosclerosis 04/14/2009  . HIATAL HERNIA 04/14/2009  . HERNIATED DISC 04/14/2009   Past Medical History:  Diagnosis Date  . CAD (coronary artery disease)   . Colon polyps   . Dyslipidemia   . Herniated disc   . Hiatal hernia   . HTN (hypertension)   . Hyperlipidemia   . Kidney stones     Past Surgical  History:  Procedure Laterality Date  . CORONARY ARTERY BYPASS GRAFT  2006   LIMA to LAD, SVG to RCA, SVG to circumflex, SVG to diagonal   . mole removed  2003  . right knee surgery  2008  . TONSILLECTOMY      No current facility-administered medications for this encounter.    Current Outpatient Medications  Medication Sig Dispense Refill Last Dose  . aspirin 81 MG tablet Take 81 mg by mouth 2 (two) times daily.    Taking  . Black Pepper-Turmeric (TURMERIC COMPLEX/BLACK PEPPER PO) Take 1 tablet by mouth daily.    Taking  . Cholecalciferol (VITAMIN D3) 5000 UNITS CAPS Take 5,000 Units by mouth daily.    Taking  . Evolocumab (REPATHA) 140 MG/ML SOSY Inject 140 mg into the skin every 14 (fourteen) days. 6 Syringe 3 Taking  . fluticasone (FLONASE) 50 MCG/ACT nasal spray Place 2 sprays into both nostrils daily as needed for allergies or rhinitis.     Marland Kitchen glucosamine-chondroitin 500-400 MG tablet Take 2 tablets by mouth daily.   Taking  . meloxicam (MOBIC) 15 MG tablet TAKE (1) TABLET DAILY AS DIRECTED. (Patient taking differently: Take 15 mg by mouth daily as needed for pain. ) 30 tablet 1 Taking  . metoprolol tartrate (LOPRESSOR) 25 MG tablet TAKE 1 TABLET IN THE MORNING AND 2 TABLETS IN THE EVENING (Patient taking differently: Take 25 mg by mouth See admin instructions. Take 25 mg by mouth in the morning and in the evening, may take additional 25 mg at bedtime if needed for palpitations) 270 tablet 2 Taking  . Multiple Vitamin (MULTI VITAMIN MENS PO) Take 1 tablet by mouth  2 (two) times daily.   Taking  . nitroGLYCERIN (NITROSTAT) 0.4 MG SL tablet Place 1 tablet (0.4 mg total) under the tongue every 5 (five) minutes as needed for chest pain. 25 tablet prn Taking  . omeprazole (PRILOSEC) 20 MG capsule Take 20 mg by mouth daily.   Taking   Allergies  Allergen Reactions  . Levofloxacin     Caused C- Diff  . Crestor [Rosuvastatin] Other (See Comments)    Liver problems  . Vytorin  [Ezetimibe-Simvastatin] Other (See Comments)    Liver problems    Social History   Tobacco Use  . Smoking status: Never Smoker  . Smokeless tobacco: Never Used  . Tobacco comment: does not smoke   Substance Use Topics  . Alcohol use: Yes    Alcohol/week: 0.0 standard drinks    Comment: with meal    Family History  Problem Relation Age of Onset  . Heart disease Father   . Cancer Father        Bladder Cancer  . Heart attack Father   . Hypertension Mother   . Alzheimer's disease Mother   . Stroke Mother   . Pneumonia Sister   . Heart disease Brother        stents  . Diabetes Brother        type 2     Review of Systems  Constitutional: Negative for chills and fever.  HENT: Negative for congestion, sore throat and tinnitus.   Eyes: Negative for double vision, photophobia and pain.  Respiratory: Negative for cough, shortness of breath and wheezing.   Cardiovascular: Negative for chest pain, palpitations and orthopnea.  Gastrointestinal: Negative for heartburn, nausea and vomiting.  Genitourinary: Negative for dysuria, frequency and urgency.  Musculoskeletal: Positive for joint pain.  Neurological: Negative for dizziness, weakness and headaches.    Objective:  Physical Exam  Well nourished and well developed.  General: Alert and oriented x3, cooperative and pleasant, no acute distress.  Head: normocephalic, atraumatic, neck supple.  Eyes: EOMI.  Respiratory: breath sounds clear in all fields, no wheezing, rales, or rhonchi. Cardiovascular: Regular rate and rhythm, no murmurs, gallops or rubs.  Abdomen: non-tender to palpation and soft, normoactive bowel sounds. Musculoskeletal:  Right Knee Exam: Varus deformity. No effusion. No Swelling. Range of motion is 0-130 degrees. No crepitus on range of motion of the knee. Positive medial joint line tenderness. Positive lateral joint line tenderness. Stable knee.  Calves soft and nontender. Motor function intact in LE.  Strength 5/5 LE bilaterally. Neuro: Distal pulses 2+. Sensation to light touch intact in LE.  Vital signs in last 24 hours: Blood pressure: 136/84 mmHg Pulse: 60 bpm  Labs:   Estimated body mass index is 29.16 kg/m as calculated from the following:   Height as of 04/02/18: 6\' 1"  (1.854 m).   Weight as of 04/02/18: 100.2 kg.   Imaging Review Plain radiographs demonstrate severe degenerative joint disease of the right knee(s). The overall alignment isneutral. The bone quality appears to be adequate for age and reported activity level.      Assessment/Plan:  End stage arthritis, right knee   The patient history, physical examination, clinical judgment of the provider and imaging studies are consistent with end stage degenerative joint disease of the right knee(s) and total knee arthroplasty is deemed medically necessary. The treatment options including medical management, injection therapy arthroscopy and arthroplasty were discussed at length. The risks and benefits of total knee arthroplasty were presented and reviewed. The risks due to  aseptic loosening, infection, stiffness, patella tracking problems, thromboembolic complications and other imponderables were discussed. The patient acknowledged the explanation, agreed to proceed with the plan and consent was signed. Patient is being admitted for inpatient treatment for surgery, pain control, PT, OT, prophylactic antibiotics, VTE prophylaxis, progressive ambulation and ADL's and discharge planning. The patient is planning to be discharged home.  Anticipated LOS equal to or greater than 2 midnights due to - Age 61 and older with one or more of the following:  - Obesity  - Expected need for hospital services (PT, OT, Nursing) required for safe  discharge  - Anticipated need for postoperative skilled nursing care or inpatient rehab  - Active co-morbidities: Coronary Artery Disease OR   - Unanticipated findings during/Post Surgery: None    - Patient is a high risk of re-admission due to: None  Therapy Plans: outpatient therapy at [facility]) Disposition: Home with wife Planned DVT Prophylaxis: Xarelto 10 mg daily (cardiac hx) DME needed: Gilford Rile PCP: Redge Gainer, MD Cardiologist: Minus Breeding, MD TXA: IV Allergies: Levaquin Anesthesia Concerns: Sleep apnea BMI: 29.6  - Patient was instructed on what medications to stop prior to surgery. - Follow-up visit in 2 weeks with Dr. Wynelle Link - Begin physical therapy following surgery - Pre-operative lab work as pre-surgical testing - Prescriptions will be provided in hospital at time of discharge  Theresa Duty, PA-C Orthopedic Surgery [facility] [place]

## 2018-04-22 NOTE — Progress Notes (Signed)
03-17-18 (Epic) Cardiac Clearance from Dr. Percival Spanish with reference to AAA w/measurement of 3.2cm, EKG  03-24-18 (Epic) ECHO  03-30-18 Surgical Clearance from Dr. Laurance Flatten on chart

## 2018-04-22 NOTE — Patient Instructions (Addendum)
[name] [name]  04/22/2018   Your procedure is scheduled on: 04-27-18    Report to [facility] Main  Entrance    Report to Admitting at 7:00 AM    Call this number if you have problems the morning of surgery [phone]    Remember: Do not eat food or drink liquids :After Midnight.     BRUSH YOUR TEETH MORNING OF SURGERY AND RINSE YOUR MOUTH OUT, NO CHEWING GUM CANDY OR MINTS.     Take these medicines the morning of surgery with A SIP OF WATER: Metoprolol Tartrate (Lopressor), and Omeprazole (Prilosec). You may also use and bring your nasal spray as needed.                                You may not have any metal on your body including hair pins and              piercings  Do not wear jewelry, cologne, lotions, powders or deodorant             Men may shave face and neck.   Do not bring valuables to the hospital. [place].  Contacts, dentures or bridgework may not be worn into surgery.  Leave suitcase in the car. After surgery it may be brought to your room.     Patients discharged the day of surgery will not be allowed to drive home. IF YOU ARE HAVING SURGERY AND GOING HOME THE SAME DAY, YOU MUST HAVE AN ADULT TO DRIVE YOU HOME AND BE WITH YOU FOR 24 HOURS. YOU MAY GO HOME BY TAXI OR UBER OR ORTHERWISE, BUT AN ADULT MUST ACCOMPANY YOU HOME AND STAY WITH YOU FOR 24 HOURS.    Special Instructions: Please bring your mask and tubing for your CPAP machine              Please read over the following fact sheets you were given:  _____________________________________________________________________             [facility] - Preparing for Surgery Before surgery, you can play an important role.  Because skin is not sterile, your skin needs to be as free of germs as possible.  You can reduce the number of germs on your skin by washing with CHG (chlorahexidine gluconate) soap before surgery.  CHG is an  antiseptic cleaner which kills germs and bonds with the skin to continue killing germs even after washing. Please DO NOT use if you have an allergy to CHG or antibacterial soaps.  If your skin becomes reddened/irritated stop using the CHG and inform your nurse when you arrive at Short Stay. Do not shave (including legs and underarms) for at least 48 hours prior to the first CHG shower.  You may shave your face/neck. Please follow these instructions carefully:  1.  Shower with CHG Soap the night before surgery and the  morning of Surgery.  2.  If you choose to wash your hair, wash your hair first as usual with your  normal  shampoo.  3.  After you shampoo, rinse your hair and body thoroughly to remove the  shampoo.  4.  Use CHG as you would any other liquid soap.  You can apply chg directly  to the skin and wash                       Gently with a scrungie or clean washcloth.  5.  Apply the CHG Soap to your body ONLY FROM THE NECK DOWN.   Do not use on face/ open                           Wound or open sores. Avoid contact with eyes, ears mouth and genitals (private parts).                       Wash face,  Genitals (private parts) with your normal soap.             6.  Wash thoroughly, paying special attention to the area where your surgery  will be performed.  7.  Thoroughly rinse your body with warm water from the neck down.  8.  DO NOT shower/wash with your normal soap after using and rinsing off  the CHG Soap.                9.  Pat yourself dry with a clean towel.            10.  Wear clean pajamas.            11.  Place clean sheets on your bed the night of your first shower and do not  sleep with pets. Day of Surgery : Do not apply any lotions/deodorants the morning of surgery.  Please wear clean clothes to the hospital/surgery center.  FAILURE TO FOLLOW THESE INSTRUCTIONS MAY RESULT IN THE CANCELLATION OF YOUR SURGERY PATIENT  SIGNATURE_________________________________  NURSE SIGNATURE__________________________________  ________________________________________________________________________   Adam Phenix  An incentive spirometer is a tool that can help keep your lungs clear and active. This tool measures how well you are filling your lungs with each breath. Taking long deep breaths may help reverse or decrease the chance of developing breathing (pulmonary) problems (especially infection) following:  A long period of time when you are unable to move or be active. BEFORE THE PROCEDURE   If the spirometer includes an indicator to show your best effort, your nurse or respiratory therapist will set it to a desired goal.  If possible, sit up straight or lean slightly forward. Try not to slouch.  Hold the incentive spirometer in an upright position. INSTRUCTIONS FOR USE  1. Sit on the edge of your bed if possible, or sit up as far as you can in bed or on a chair. 2. Hold the incentive spirometer in an upright position. 3. Breathe out normally. 4. Place the mouthpiece in your mouth and seal your lips tightly around it. 5. Breathe in slowly and as deeply as possible, raising the piston or the ball toward the top of the column. 6. Hold your breath for 3-5 seconds or for as long as possible. Allow the piston or ball to fall to the bottom of the column. 7. Remove the mouthpiece from your mouth and breathe out normally. 8. Rest for a few seconds and repeat Steps 1 through 7 at least 10 times every 1-2 hours when you are awake. Take your time and take a few normal breaths between deep breaths. 9. The spirometer may include an indicator to show  your best effort. Use the indicator as a goal to work toward during each repetition. 10. After each set of 10 deep breaths, practice coughing to be sure your lungs are clear. If you have an incision (the cut made at the time of surgery), support your incision when coughing  by placing a pillow or rolled up towels firmly against it. Once you are able to get out of bed, walk around indoors and cough well. You may stop using the incentive spirometer when instructed by your caregiver.  RISKS AND COMPLICATIONS  Take your time so you do not get dizzy or light-headed.  If you are in pain, you may need to take or ask for pain medication before doing incentive spirometry. It is harder to take a deep breath if you are having pain. AFTER USE  Rest and breathe slowly and easily.  It can be helpful to keep track of a log of your progress. Your caregiver can provide you with a simple table to help with this. If you are using the spirometer at home, follow these instructions: [place] IF:   You are having difficultly using the spirometer.  You have trouble using the spirometer as often as instructed.  Your pain medication is not giving enough relief while using the spirometer.  You develop fever of 100.5 F (38.1 C) or higher. SEEK IMMEDIATE MEDICAL CARE IF:   You cough up bloody sputum that had not been present before.  You develop fever of 102 F (38.9 C) or greater.  You develop worsening pain at or near the incision site. MAKE SURE YOU:   Understand these instructions.  Will watch your condition.  Will get help right away if you are not doing well or get worse. Document Released: 07/01/2006 Document Revised: 05/13/2011 Document Reviewed: 09/01/2006 Exit[employer] Patient Information 2014 Exit[employer], [place].   ________________________________________________________________________  WHAT IS A BLOOD TRANSFUSION? Blood Transfusion Information  A transfusion is the replacement of blood or some of its parts. Blood is made up of multiple cells which provide different functions.  Red blood cells carry oxygen and are used for blood loss replacement.  White blood cells fight against infection.  Platelets control bleeding.  Plasma helps clot  blood.  Other blood products are available for specialized needs, such as hemophilia or other clotting disorders. BEFORE THE TRANSFUSION  Who gives blood for transfusions?   Healthy volunteers who are fully evaluated to make sure their blood is safe. This is blood bank blood. Transfusion therapy is the safest it has ever been in the practice of medicine. Before blood is taken from a donor, a complete history is taken to make sure that person has no history of diseases nor engages in risky social behavior (examples are intravenous drug use or sexual activity with multiple partners). The donor's travel history is screened to minimize risk of transmitting infections, such as malaria. The donated blood is tested for signs of infectious diseases, such as HIV and hepatitis. The blood is then tested to be sure it is compatible with you in order to minimize the chance of a transfusion reaction. If you or a relative donates blood, this is often done in anticipation of surgery and is not appropriate for emergency situations. It takes many days to process the donated blood. RISKS AND COMPLICATIONS Although transfusion therapy is very safe and saves many lives, the main dangers of transfusion include:   Getting an infectious disease.  Developing a transfusion reaction. This is an allergic reaction to  something in the blood you were given. Every precaution is taken to prevent this. The decision to have a blood transfusion has been considered carefully by your caregiver before blood is given. Blood is not given unless the benefits outweigh the risks. AFTER THE TRANSFUSION  Right after receiving a blood transfusion, you will usually feel much better and more energetic. This is especially true if your red blood cells have gotten low (anemic). The transfusion raises the level of the red blood cells which carry oxygen, and this usually causes an energy increase.  The nurse administering the transfusion will monitor  you carefully for complications. HOME CARE INSTRUCTIONS  No special instructions are needed after a transfusion. You may find your energy is better. Speak with your caregiver about any limitations on activity for underlying diseases you may have. SEEK MEDICAL CARE IF:   Your condition is not improving after your transfusion.  You develop redness or irritation at the intravenous (IV) site. SEEK IMMEDIATE MEDICAL CARE IF:  Any of the following symptoms occur over the next 12 hours:  Shaking chills.  You have a temperature by mouth above 102 F (38.9 C), not controlled by medicine.  Chest, back, or muscle pain.  People around you feel you are not acting correctly or are confused.  Shortness of breath or difficulty breathing.  Dizziness and fainting.  You get a rash or develop hives.  You have a decrease in urine output.  Your urine turns a dark color or changes to pink, red, or brown. Any of the following symptoms occur over the next 10 days:  You have a temperature by mouth above 102 F (38.9 C), not controlled by medicine.  Shortness of breath.  Weakness after normal activity.  The white part of the eye turns yellow (jaundice).  You have a decrease in the amount of urine or are urinating less often.  Your urine turns a dark color or changes to pink, red, or brown. Document Released: 02/16/2000 Document Revised: 05/13/2011 Document Reviewed: 10/05/2007 [facility] Patient Information 2014 [place], [place].  _______________________________________________________________________

## 2018-04-23 ENCOUNTER — Encounter (HOSPITAL_COMMUNITY): Payer: Self-pay

## 2018-04-23 ENCOUNTER — Encounter (HOSPITAL_COMMUNITY)
Admission: RE | Admit: 2018-04-23 | Discharge: 2018-04-23 | Disposition: A | Discharge status: Home / Self Care | Payer: Medicare Other | Source: Ambulatory Visit | Attending: Orthopedic Surgery | Admitting: Orthopedic Surgery

## 2018-04-23 ENCOUNTER — Other Ambulatory Visit: Payer: Self-pay

## 2018-04-23 DIAGNOSIS — M1711 Unilateral primary osteoarthritis, right knee: Secondary | ICD-10-CM | POA: Insufficient documentation

## 2018-04-23 DIAGNOSIS — Z01812 Encounter for preprocedural laboratory examination: Secondary | ICD-10-CM | POA: Insufficient documentation

## 2018-04-23 LAB — SURGICAL PCR SCREEN
MRSA, PCR: NEGATIVE
Staphylococcus aureus: NEGATIVE

## 2018-04-23 LAB — COMPREHENSIVE METABOLIC PANEL
ALT: 20 U/L (ref 0–44)
AST: 24 U/L (ref 15–41)
Albumin: 4.5 g/dL (ref 3.5–5.0)
Alkaline Phosphatase: 63 U/L (ref 38–126)
Anion gap: 6 (ref 5–15)
BUN: 20 mg/dL (ref 8–23)
CO2: 27 mmol/L (ref 22–32)
Calcium: 9.2 mg/dL (ref 8.9–10.3)
Chloride: 107 mmol/L (ref 98–111)
Creatinine, Ser: 0.81 mg/dL (ref 0.61–1.24)
GFR calc Af Amer: >60 mL/min (ref >60)
GFR calc non Af Amer: >60 mL/min (ref >60)
Glucose, Bld: 101 mg/dL — ABNORMAL HIGH (ref 70–99)
Potassium: 4.1 mmol/L (ref 3.5–5.1)
Sodium: 140 mmol/L (ref 135–145)
Total Bilirubin: 0.6 mg/dL (ref 0.3–1.2)
Total Protein: 7 g/dL (ref 6.5–8.1)

## 2018-04-23 LAB — CBC
HCT: 45.4 % (ref 39.0–52.0)
Hemoglobin: 14.2 g/dL (ref 13.0–17.0)
MCH: 30.9 pg (ref 26.0–34.0)
MCHC: 31.3 g/dL (ref 30.0–36.0)
MCV: 98.7 fL (ref 80.0–100.0)
Platelets: 156 10*3/uL (ref 150–400)
RBC: 4.6 MIL/uL (ref 4.22–5.81)
RDW: 13.2 % (ref 11.5–15.5)
WBC: 6 10*3/uL (ref 4.0–10.5)
nRBC: 0 % (ref 0.0–0.2)

## 2018-04-23 LAB — PROTIME-INR
INR: 1.04
Prothrombin Time: 13.5 seconds (ref 11.4–15.2)

## 2018-04-23 LAB — APTT: aPTT: 21 seconds — ABNORMAL LOW (ref 24–36)

## 2018-04-23 LAB — ABO/RH: ABO/RH(D): A POS

## 2018-04-23 NOTE — Progress Notes (Signed)
PCP: Redge Gainer  CARDIOLOGIST: Dr. Percival Spanish  INFO IN Epic:  03-17-18 (Epic) Cardiac Clearance from Dr. Percival Spanish with reference to AAA w/measurement of 3.2cm, EKG  03-24-18 (Epic) ECHO  INFO ON CHART  :03-30-18 Surgical Clearance from Dr. Laurance Flatten on chart   BLOOD THINNERS AND LAST DOSES:   ASA 81mg , Last dose 04/21/18 ____________________________________  PATIENT SYMPTOMS AT TIME OF PREOP:  Hx. CAD, HTN, AAA

## 2018-04-24 NOTE — Anesthesia Preprocedure Evaluation (Addendum)
Anesthesia Evaluation  Patient identified by MRN, date of birth, ID band Patient awake    Reviewed: Allergy & Precautions, NPO status , Patient's Chart, lab work & pertinent test results  History of Anesthesia Complications Negative for: history of anesthetic complications  Airway Mallampati: II  TM Distance: >3 FB Neck ROM: Full    Dental  (+) Teeth Intact, Dental Advisory Given   Pulmonary sleep apnea and Continuous Positive Airway Pressure Ventilation ,    Pulmonary exam normal breath sounds clear to auscultation       Cardiovascular hypertension, Pt. on medications and Pt. on home beta blockers + CAD and + CABG (2006)  Normal cardiovascular exam Rhythm:Regular Rate:Normal  TTE 03/24/18: EF 60-65%, mild AR, mild MR, mild LAE, mild TR    Neuro/Psych negative neurological ROS     GI/Hepatic Neg liver ROS, hiatal hernia,   Endo/Other  negative endocrine ROS  Renal/GU negative Renal ROS     Musculoskeletal  (+) Arthritis ,   Abdominal   Peds  Hematology negative hematology ROS (+)   Anesthesia Other Findings Day of surgery medications reviewed with the patient.  Reproductive/Obstetrics                           Anesthesia Physical Anesthesia Plan  ASA: III  Anesthesia Plan: Spinal   Post-op Pain Management:  Regional for Post-op pain   Induction:   PONV Risk Score and Plan: 1 and Treatment may vary due to age or medical condition, Ondansetron and Propofol infusion  Airway Management Planned: Natural Airway and Simple Face Mask  Additional Equipment:   Intra-op Plan:   Post-operative Plan:   Informed Consent: I have reviewed the patients History and Physical, chart, labs and discussed the procedure including the risks, benefits and alternatives for the proposed anesthesia with the patient or authorized representative who has indicated his/her understanding and acceptance.      Dental advisory given  Plan Discussed with: CRNA  Anesthesia Plan Comments: (See PAT note 04/23/18, Konrad Felix, PA-C)      Anesthesia Quick Evaluation

## 2018-04-24 NOTE — Progress Notes (Signed)
Anesthesia Chart Review   Case:  [id number] Date/Time:  04/27/18 1014   Procedure:  TOTAL KNEE ARTHROPLASTY (Right ) - 31min   Anesthesia type:  Choice   Pre-op diagnosis:  right knee osteoarthritis   Location:  [facility] ROOM 10 / [facility]   Surgeon:  Gaynelle Arabian, MD      DISCUSSION: 78 yo never smoker with h/o CAD, HLD, HTN, hiatal hernia, right knee OA scheduled for above surgery 04/27/18 with Dr. Gaynelle Arabian.   Pt last seen by cardiologist, Dr. Minus Breeding, on 03/17/18.  Per Dr. Percival Spanish, "The patient has a good functional level.  He had a negative stress test in 2018 October.  He has no new symptoms since then.  He is not going for high risk procedure.  Therefore, according to ACC/AHA guidelines the patient is at acceptable risk for the planned procedure.  No further testing is indicated." Echo orodered at this visit with normal EF, no significant valve issues.  Clearance also received from PCP, Dr. Redge Gainer, on 03/30/18; on chart.   Pt can proceed with planned procedure barring acute status change.   VS: BP 137/75   Pulse (!) 55   Temp 36.6 C (Oral)   Ht 6\' 1"  (1.854 m)   Wt 98 kg   SpO2 98%   BMI 28.51 kg/m   PROVIDERS: Chipper Herb, MD is PCP  Minus Breeding, MD is Cardiologist  LABS: Labs reviewed: Acceptable for surgery. (all labs ordered are listed, but only abnormal results are displayed)  Labs Reviewed  APTT - Abnormal; Notable for the following components:      Result Value   aPTT 21 (*)    All other components within normal limits  COMPREHENSIVE METABOLIC PANEL - Abnormal; Notable for the following components:   Glucose, Bld 101 (*)    All other components within normal limits  SURGICAL PCR SCREEN  CBC  PROTIME-INR  TYPE AND SCREEN  ABO/RH     IMAGES:   EKG: 03/17/2018 Rate 59 bpm Sinus bradycardia with 1st degree AV block with premature atrial complexes Otherwise normal ECG  CV: Echo 03/24/2018  Study Conclusions  - Left ventricle:  The cavity size was normal. There was mild   concentric hypertrophy. Systolic function was normal. The   estimated ejection fraction was in the range of 60% to 65%. Wall   motion was normal; there were no regional wall motion   abnormalities. The study is not technically sufficient to allow   evaluation of LV diastolic function. - Aortic valve: Transvalvular velocity was within the normal range.   There was no stenosis. There was mild regurgitation.   Regurgitation pressure half-time: 625 ms. - Aorta: Ascending aortic diameter: 38 mm (S). - Ascending aorta: The ascending aorta was mildly dilated. - Mitral valve: Mildly calcified annulus. Transvalvular velocity   was within the normal range. There was no evidence for stenosis.   There was mild regurgitation. - Left atrium: The atrium was mildly dilated. - Right ventricle: The cavity size was normal. Wall thickness was   normal. Systolic function was normal. - Atrial septum: No defect or patent foramen ovale was identified   by color flow Doppler. - Tricuspid valve: There was mild regurgitation. - Pulmonary arteries: Systolic pressure was within the normal   range. PA peak pressure: 27 mm Hg (S). - Global longitudinal strain -19.8% (normal).  Stress Test 12/31/2016  Blood pressure demonstrated a hypertensive response to exercise.  Upsloping ST segment depression ST segment depression  of 1 mm was noted during stress in the II, III and aVF leads.   Hypertensive response to exercise. Otherwise normal ECG stress test. Past Medical History:  Diagnosis Date  . CAD (coronary artery disease)   . Colon polyps   . Dyslipidemia   . Herniated disc   . Hiatal hernia   . HTN (hypertension)   . Hyperlipidemia   . Kidney stones     Past Surgical History:  Procedure Laterality Date  . CORONARY ARTERY BYPASS GRAFT  2006   LIMA to LAD, SVG to RCA, SVG to circumflex, SVG to diagonal   . mole removed  2003  . right knee surgery  2008  .  TONSILLECTOMY      MEDICATIONS: . aspirin 81 MG tablet  . Black Pepper-Turmeric (TURMERIC COMPLEX/BLACK PEPPER PO)  . Cholecalciferol (VITAMIN D3) 5000 UNITS CAPS  . Evolocumab (REPATHA) 140 MG/ML SOSY  . fluticasone (FLONASE) 50 MCG/ACT nasal spray  . glucosamine-chondroitin 500-400 MG tablet  . meloxicam (MOBIC) 15 MG tablet  . metoprolol tartrate (LOPRESSOR) 25 MG tablet  . Multiple Vitamin (MULTI VITAMIN MENS PO)  . nitroGLYCERIN (NITROSTAT) 0.4 MG SL tablet  . omeprazole (PRILOSEC) 20 MG capsule   No current facility-administered medications for this encounter.     Maia Plan [facility] Pre-Surgical Testing [phone] 04/24/18 3:16 PM

## 2018-04-26 MED ORDER — BUPIVACAINE LIPOSOME 1.3 % IJ SUSP
20.0000 mL | INTRAMUSCULAR | Status: DC
  Filled 2018-04-26: qty 20

## 2018-04-27 ENCOUNTER — Other Ambulatory Visit: Payer: Self-pay

## 2018-04-27 ENCOUNTER — Encounter (HOSPITAL_COMMUNITY)
Admission: RE | Disposition: A | Discharge status: Home / Self Care | Payer: Self-pay | Source: Home / Self Care | Attending: Orthopedic Surgery

## 2018-04-27 ENCOUNTER — Encounter (HOSPITAL_COMMUNITY): Payer: Self-pay | Admitting: *Deleted

## 2018-04-27 ENCOUNTER — Inpatient Hospital Stay (HOSPITAL_COMMUNITY): Payer: Medicare Other | Admitting: Certified Registered Nurse Anesthetist

## 2018-04-27 ENCOUNTER — Observation Stay (HOSPITAL_COMMUNITY)
Admission: RE | Admit: 2018-04-27 | Discharge: 2018-04-28 | Disposition: A | Discharge status: Home / Self Care | Payer: Medicare Other | Attending: Orthopedic Surgery | Admitting: Orthopedic Surgery

## 2018-04-27 ENCOUNTER — Inpatient Hospital Stay (HOSPITAL_COMMUNITY): Payer: Medicare Other | Admitting: Physician Assistant

## 2018-04-27 DIAGNOSIS — G4730 Sleep apnea, unspecified: Secondary | ICD-10-CM | POA: Diagnosis not present

## 2018-04-27 DIAGNOSIS — Z7982 Long term (current) use of aspirin: Secondary | ICD-10-CM | POA: Insufficient documentation

## 2018-04-27 DIAGNOSIS — I10 Essential (primary) hypertension: Secondary | ICD-10-CM | POA: Insufficient documentation

## 2018-04-27 DIAGNOSIS — E669 Obesity, unspecified: Secondary | ICD-10-CM | POA: Diagnosis not present

## 2018-04-27 DIAGNOSIS — Z79899 Other long term (current) drug therapy: Secondary | ICD-10-CM | POA: Insufficient documentation

## 2018-04-27 DIAGNOSIS — M1710 Unilateral primary osteoarthritis, unspecified knee: Secondary | ICD-10-CM | POA: Diagnosis present

## 2018-04-27 DIAGNOSIS — N400 Enlarged prostate without lower urinary tract symptoms: Secondary | ICD-10-CM | POA: Diagnosis not present

## 2018-04-27 DIAGNOSIS — Z7951 Long term (current) use of inhaled steroids: Secondary | ICD-10-CM | POA: Diagnosis not present

## 2018-04-27 DIAGNOSIS — Z791 Long term (current) use of non-steroidal anti-inflammatories (NSAID): Secondary | ICD-10-CM | POA: Insufficient documentation

## 2018-04-27 DIAGNOSIS — E785 Hyperlipidemia, unspecified: Secondary | ICD-10-CM | POA: Diagnosis not present

## 2018-04-27 DIAGNOSIS — Z6826 Body mass index (BMI) 26.0-26.9, adult: Secondary | ICD-10-CM | POA: Diagnosis not present

## 2018-04-27 DIAGNOSIS — M1711 Unilateral primary osteoarthritis, right knee: Secondary | ICD-10-CM | POA: Diagnosis not present

## 2018-04-27 DIAGNOSIS — I2510 Atherosclerotic heart disease of native coronary artery without angina pectoris: Secondary | ICD-10-CM | POA: Diagnosis not present

## 2018-04-27 DIAGNOSIS — G8918 Other acute postprocedural pain: Secondary | ICD-10-CM | POA: Diagnosis not present

## 2018-04-27 DIAGNOSIS — Z951 Presence of aortocoronary bypass graft: Secondary | ICD-10-CM | POA: Diagnosis not present

## 2018-04-27 DIAGNOSIS — M179 Osteoarthritis of knee, unspecified: Secondary | ICD-10-CM | POA: Diagnosis present

## 2018-04-27 HISTORY — PX: TOTAL KNEE ARTHROPLASTY: SHX125

## 2018-04-27 LAB — TYPE AND SCREEN
ABO/RH(D): A POS
Antibody Screen: NEGATIVE

## 2018-04-27 SURGERY — ARTHROPLASTY, KNEE, TOTAL
Anesthesia: Spinal | Site: Knee | Laterality: Right

## 2018-04-27 MED ORDER — STERILE WATER FOR IRRIGATION IR SOLN
Status: DC | PRN
  Administered 2018-04-27: 2000 mL

## 2018-04-27 MED ORDER — PANTOPRAZOLE SODIUM 40 MG PO TBEC
40.0000 mg | DELAYED_RELEASE_TABLET | Freq: Every day | ORAL | Status: DC
  Administered 2018-04-28: 40 mg via ORAL
  Filled 2018-04-27: qty 1

## 2018-04-27 MED ORDER — OXYCODONE HCL 5 MG/5ML PO SOLN: 5.0000 mg | Freq: Once | ORAL | Status: DC | PRN

## 2018-04-27 MED ORDER — RIVAROXABAN 10 MG PO TABS
10.0000 mg | ORAL_TABLET | Freq: Every day | ORAL | Status: DC
  Administered 2018-04-28: 10 mg via ORAL
  Filled 2018-04-27: qty 1

## 2018-04-27 MED ORDER — METHOCARBAMOL 500 MG IVPB - SIMPLE MED
500.0000 mg | Freq: Four times a day (QID) | INTRAVENOUS | Status: DC | PRN
  Filled 2018-04-27: qty 50

## 2018-04-27 MED ORDER — METOPROLOL TARTRATE 25 MG PO TABS
25.0000 mg | ORAL_TABLET | Freq: Two times a day (BID) | ORAL | Status: DC
  Administered 2018-04-27 – 2018-04-28 (×2): 25 mg via ORAL
  Filled 2018-04-27 (×2): qty 1

## 2018-04-27 MED ORDER — FLEET ENEMA 7-19 GM/118ML RE ENEM: 1.0000 | ENEMA | Freq: Once | RECTAL | Status: DC | PRN

## 2018-04-27 MED ORDER — METHOCARBAMOL 500 MG PO TABS
500.0000 mg | ORAL_TABLET | Freq: Four times a day (QID) | ORAL | Status: DC | PRN
  Administered 2018-04-27 (×2): 500 mg via ORAL
  Filled 2018-04-27 (×2): qty 1

## 2018-04-27 MED ORDER — PROPOFOL 10 MG/ML IV BOLUS
INTRAVENOUS | Status: AC
  Filled 2018-04-27: qty 40

## 2018-04-27 MED ORDER — PROPOFOL 500 MG/50ML IV EMUL
INTRAVENOUS | Status: DC | PRN
  Administered 2018-04-27: 60 ug/kg/min via INTRAVENOUS

## 2018-04-27 MED ORDER — BUPIVACAINE-EPINEPHRINE (PF) 0.5% -1:200000 IJ SOLN
INTRAMUSCULAR | Status: DC | PRN
  Administered 2018-04-27: 15 mL via PERINEURAL

## 2018-04-27 MED ORDER — LACTATED RINGERS IV SOLN
INTRAVENOUS | Status: DC
  Administered 2018-04-27: 1000 mL via INTRAVENOUS
  Administered 2018-04-27: 08:00:00 via INTRAVENOUS

## 2018-04-27 MED ORDER — BISACODYL 10 MG RE SUPP: 10.0000 mg | Freq: Every day | RECTAL | Status: DC | PRN

## 2018-04-27 MED ORDER — MENTHOL 3 MG MT LOZG: 1.0000 | LOZENGE | OROMUCOSAL | Status: DC | PRN

## 2018-04-27 MED ORDER — ONDANSETRON HCL 4 MG/2ML IJ SOLN: 4.0000 mg | Freq: Four times a day (QID) | INTRAMUSCULAR | Status: DC | PRN

## 2018-04-27 MED ORDER — SODIUM CHLORIDE (PF) 0.9 % IJ SOLN
INTRAMUSCULAR | Status: AC
  Filled 2018-04-27: qty 10

## 2018-04-27 MED ORDER — PROMETHAZINE HCL 25 MG/ML IJ SOLN: 6.2500 mg | INTRAMUSCULAR | Status: DC | PRN

## 2018-04-27 MED ORDER — ACETAMINOPHEN 10 MG/ML IV SOLN
1000.0000 mg | Freq: Four times a day (QID) | INTRAVENOUS | Status: DC
  Administered 2018-04-27: 1000 mg via INTRAVENOUS
  Filled 2018-04-27: qty 100

## 2018-04-27 MED ORDER — NITROGLYCERIN 0.4 MG SL SUBL: 0.4000 mg | SUBLINGUAL_TABLET | SUBLINGUAL | Status: DC | PRN

## 2018-04-27 MED ORDER — METOCLOPRAMIDE HCL 5 MG/ML IJ SOLN: 5.0000 mg | Freq: Three times a day (TID) | INTRAMUSCULAR | Status: DC | PRN

## 2018-04-27 MED ORDER — BUPIVACAINE LIPOSOME 1.3 % IJ SUSP
INTRAMUSCULAR | Status: DC | PRN
  Administered 2018-04-27: 20 mL

## 2018-04-27 MED ORDER — METOCLOPRAMIDE HCL 5 MG PO TABS: 5.0000 mg | ORAL_TABLET | Freq: Three times a day (TID) | ORAL | Status: DC | PRN

## 2018-04-27 MED ORDER — SODIUM CHLORIDE (PF) 0.9 % IJ SOLN
INTRAMUSCULAR | Status: DC | PRN
  Administered 2018-04-27: 60 mL via INTRAVENOUS

## 2018-04-27 MED ORDER — GABAPENTIN 300 MG PO CAPS
300.0000 mg | ORAL_CAPSULE | Freq: Three times a day (TID) | ORAL | Status: DC
  Administered 2018-04-27 – 2018-04-28 (×3): 300 mg via ORAL
  Filled 2018-04-27 (×3): qty 1

## 2018-04-27 MED ORDER — ONDANSETRON HCL 4 MG PO TABS: 4.0000 mg | ORAL_TABLET | Freq: Four times a day (QID) | ORAL | Status: DC | PRN

## 2018-04-27 MED ORDER — TRANEXAMIC ACID-NACL 1000-0.7 MG/100ML-% IV SOLN
1000.0000 mg | INTRAVENOUS | Status: AC
  Administered 2018-04-27: 1000 mg via INTRAVENOUS
  Filled 2018-04-27: qty 100

## 2018-04-27 MED ORDER — CEFAZOLIN SODIUM-DEXTROSE 2-4 GM/100ML-% IV SOLN
2.0000 g | Freq: Four times a day (QID) | INTRAVENOUS | Status: AC
  Administered 2018-04-27 (×2): 2 g via INTRAVENOUS
  Filled 2018-04-27 (×3): qty 100

## 2018-04-27 MED ORDER — SODIUM CHLORIDE 0.9 % IR SOLN
Status: DC | PRN
  Administered 2018-04-27: 1000 mL

## 2018-04-27 MED ORDER — POLYETHYLENE GLYCOL 3350 17 G PO PACK: 17.0000 g | PACK | Freq: Every day | ORAL | Status: DC | PRN

## 2018-04-27 MED ORDER — FENTANYL CITRATE (PF) 100 MCG/2ML IJ SOLN
50.0000 ug | Freq: Once | INTRAMUSCULAR | Status: AC
  Administered 2018-04-27: 50 ug via INTRAVENOUS
  Filled 2018-04-27: qty 2

## 2018-04-27 MED ORDER — SODIUM CHLORIDE 0.9 % IV SOLN
INTRAVENOUS | Status: DC
  Administered 2018-04-27 (×2): via INTRAVENOUS

## 2018-04-27 MED ORDER — FLUTICASONE PROPIONATE 50 MCG/ACT NA SUSP: 2.0000 | Freq: Every day | NASAL | Status: DC | PRN

## 2018-04-27 MED ORDER — DEXAMETHASONE SODIUM PHOSPHATE 10 MG/ML IJ SOLN
INTRAMUSCULAR | Status: AC
  Filled 2018-04-27: qty 1

## 2018-04-27 MED ORDER — CEFAZOLIN SODIUM-DEXTROSE 2-4 GM/100ML-% IV SOLN
2.0000 g | INTRAVENOUS | Status: AC
  Administered 2018-04-27: 2 g via INTRAVENOUS
  Filled 2018-04-27: qty 100

## 2018-04-27 MED ORDER — MORPHINE SULFATE (PF) 2 MG/ML IV SOLN: 1.0000 mg | INTRAVENOUS | Status: DC | PRN

## 2018-04-27 MED ORDER — OXYCODONE HCL 5 MG PO TABS: 5.0000 mg | ORAL_TABLET | Freq: Once | ORAL | Status: DC | PRN

## 2018-04-27 MED ORDER — DIPHENHYDRAMINE HCL 12.5 MG/5ML PO ELIX: 12.5000 mg | ORAL_SOLUTION | ORAL | Status: DC | PRN

## 2018-04-27 MED ORDER — ONDANSETRON HCL 4 MG/2ML IJ SOLN
INTRAMUSCULAR | Status: AC
  Filled 2018-04-27: qty 2

## 2018-04-27 MED ORDER — ACETAMINOPHEN 10 MG/ML IV SOLN: 1000.0000 mg | Freq: Once | INTRAVENOUS | Status: DC | PRN

## 2018-04-27 MED ORDER — DOCUSATE SODIUM 100 MG PO CAPS
100.0000 mg | ORAL_CAPSULE | Freq: Two times a day (BID) | ORAL | Status: DC
  Administered 2018-04-27 – 2018-04-28 (×2): 100 mg via ORAL
  Filled 2018-04-27 (×2): qty 1

## 2018-04-27 MED ORDER — OXYCODONE HCL 5 MG PO TABS
5.0000 mg | ORAL_TABLET | ORAL | Status: DC | PRN
  Administered 2018-04-27: 10 mg via ORAL
  Administered 2018-04-27 (×2): 5 mg via ORAL
  Administered 2018-04-28: 10 mg via ORAL
  Administered 2018-04-28: 5 mg via ORAL
  Filled 2018-04-27 (×2): qty 2
  Filled 2018-04-27 (×3): qty 1

## 2018-04-27 MED ORDER — PROPOFOL 500 MG/50ML IV EMUL
INTRAVENOUS | Status: DC | PRN
  Administered 2018-04-27: 20 mg via INTRAVENOUS

## 2018-04-27 MED ORDER — ONDANSETRON HCL 4 MG/2ML IJ SOLN
INTRAMUSCULAR | Status: DC | PRN
  Administered 2018-04-27: 4 mg via INTRAVENOUS

## 2018-04-27 MED ORDER — SODIUM CHLORIDE (PF) 0.9 % IJ SOLN
INTRAMUSCULAR | Status: AC
  Filled 2018-04-27: qty 50

## 2018-04-27 MED ORDER — DEXAMETHASONE SODIUM PHOSPHATE 10 MG/ML IJ SOLN
10.0000 mg | Freq: Once | INTRAMUSCULAR | Status: AC
  Administered 2018-04-28: 10 mg via INTRAVENOUS
  Filled 2018-04-27: qty 1

## 2018-04-27 MED ORDER — FENTANYL CITRATE (PF) 100 MCG/2ML IJ SOLN: 25.0000 ug | INTRAMUSCULAR | Status: DC | PRN

## 2018-04-27 MED ORDER — MIDAZOLAM HCL 2 MG/2ML IJ SOLN
1.0000 mg | Freq: Once | INTRAMUSCULAR | Status: AC
  Administered 2018-04-27: 1 mg via INTRAVENOUS
  Filled 2018-04-27: qty 2

## 2018-04-27 MED ORDER — TRAMADOL HCL 50 MG PO TABS: 50.0000 mg | ORAL_TABLET | Freq: Four times a day (QID) | ORAL | Status: DC | PRN

## 2018-04-27 MED ORDER — CHLORHEXIDINE GLUCONATE 4 % EX LIQD: 60.0000 mL | Freq: Once | CUTANEOUS | Status: DC

## 2018-04-27 MED ORDER — DEXAMETHASONE SODIUM PHOSPHATE 10 MG/ML IJ SOLN
8.0000 mg | Freq: Once | INTRAMUSCULAR | Status: AC
  Administered 2018-04-27: 8 mg via INTRAVENOUS

## 2018-04-27 MED ORDER — ACETAMINOPHEN 500 MG PO TABS
1000.0000 mg | ORAL_TABLET | Freq: Four times a day (QID) | ORAL | Status: AC
  Administered 2018-04-27 – 2018-04-28 (×4): 1000 mg via ORAL
  Filled 2018-04-27 (×4): qty 2

## 2018-04-27 MED ORDER — BUPIVACAINE IN DEXTROSE 0.75-8.25 % IT SOLN
INTRATHECAL | Status: DC | PRN
  Administered 2018-04-27: 1.6 mL via INTRATHECAL

## 2018-04-27 MED ORDER — PHENOL 1.4 % MT LIQD: 1.0000 | OROMUCOSAL | Status: DC | PRN

## 2018-04-27 SURGICAL SUPPLY — 61 items
ATTUNE MED DOME PAT 41 KNEE (Knees) ×1 IMPLANT
ATTUNE MED DOME PAT 41MM KNEE (Knees) ×1 IMPLANT
ATTUNE PS FEM RT SZ9 CEM KNEE (Femur) ×2 IMPLANT
BAG SPEC THK2 15X12 ZIP CLS (MISCELLANEOUS)
BAG ZIPLOCK 12X15 (MISCELLANEOUS) ×1 IMPLANT
BANDAGE ACE 6X5 VEL STRL LF (GAUZE/BANDAGES/DRESSINGS) ×3 IMPLANT
BANDAGE ELASTIC 6 VELCRO ST LF (GAUZE/BANDAGES/DRESSINGS) ×2 IMPLANT
BASE TIBIAL ROT PLAT SZ 8 KNEE (Knees) IMPLANT
BLADE SAG 18X100X1.27 (BLADE) ×3 IMPLANT
BLADE SAW SGTL 11.0X1.19X90.0M (BLADE) ×3 IMPLANT
BLADE SURG SZ10 CARB STEEL (BLADE) ×6 IMPLANT
BOWL SMART MIX CTS (DISPOSABLE) ×3 IMPLANT
BSPLAT TIB 8 CMNT ROT PLAT STR (Knees) ×1 IMPLANT
CEMENT HV SMART SET (Cement) ×4 IMPLANT
CLOSURE WOUND 1/2 X4 (GAUZE/BANDAGES/DRESSINGS) ×2
COVER SURGICAL LIGHT HANDLE (MISCELLANEOUS) ×3 IMPLANT
COVER WAND RF STERILE (DRAPES) ×2 IMPLANT
CUFF TOURN SGL QUICK 34 (TOURNIQUET CUFF) ×3
CUFF TRNQT CYL 34X4.125X (TOURNIQUET CUFF) ×1 IMPLANT
DECANTER SPIKE VIAL GLASS SM (MISCELLANEOUS) ×3 IMPLANT
DRAPE U-SHAPE 47X51 STRL (DRAPES) ×3 IMPLANT
DRSG EMULSION OIL 3X16 NADH (GAUZE/BANDAGES/DRESSINGS) ×2 IMPLANT
DRSG PAD ABDOMINAL 8X10 ST (GAUZE/BANDAGES/DRESSINGS) ×3 IMPLANT
DURAPREP 26ML APPLICATOR (WOUND CARE) ×3 IMPLANT
ELECT REM PT RETURN 15FT ADLT (MISCELLANEOUS) ×3 IMPLANT
EVACUATOR 1/8 PVC DRAIN (DRAIN) ×3 IMPLANT
GAUZE SPONGE 4X4 12PLY STRL (GAUZE/BANDAGES/DRESSINGS) ×3 IMPLANT
GLOVE BIO SURGEON STRL SZ7 (GLOVE) ×3 IMPLANT
GLOVE BIO SURGEON STRL SZ8 (GLOVE) ×3 IMPLANT
GLOVE BIOGEL PI IND STRL 6.5 (GLOVE) ×1 IMPLANT
GLOVE BIOGEL PI IND STRL 7.0 (GLOVE) ×1 IMPLANT
GLOVE BIOGEL PI IND STRL 8 (GLOVE) ×1 IMPLANT
GLOVE BIOGEL PI INDICATOR 6.5 (GLOVE) ×2
GLOVE BIOGEL PI INDICATOR 7.0 (GLOVE) ×2
GLOVE BIOGEL PI INDICATOR 8 (GLOVE) ×2
GLOVE SURG SS PI 6.5 STRL IVOR (GLOVE) ×3 IMPLANT
GOWN STRL REUS W/TWL LRG LVL3 (GOWN DISPOSABLE) ×9 IMPLANT
HANDPIECE INTERPULSE COAX TIP (DISPOSABLE) ×3
HOLDER FOLEY CATH W/STRAP (MISCELLANEOUS) ×2 IMPLANT
IMMOBILIZER KNEE 20 (SOFTGOODS) ×5 IMPLANT
IMMOBILIZER KNEE 20 THIGH 36 (SOFTGOODS) ×1 IMPLANT
INSERT TIB ATTUNE RP SZ9X12 (Insert) ×2 IMPLANT
MANIFOLD NEPTUNE II (INSTRUMENTS) ×3 IMPLANT
NS IRRIG 1000ML POUR BTL (IV SOLUTION) ×3 IMPLANT
PACK TOTAL KNEE CUSTOM (KITS) ×3 IMPLANT
PADDING CAST COTTON 6X4 STRL (CAST SUPPLIES) ×7 IMPLANT
PIN STEINMAN FIXATION KNEE (PIN) ×2 IMPLANT
PIN THREADED HEADED SIGMA (PIN) ×2 IMPLANT
PROTECTOR NERVE ULNAR (MISCELLANEOUS) ×3 IMPLANT
SET HNDPC FAN SPRY TIP SCT (DISPOSABLE) ×1 IMPLANT
STRIP CLOSURE SKIN 1/2X4 (GAUZE/BANDAGES/DRESSINGS) ×4 IMPLANT
SUT MNCRL AB 4-0 PS2 18 (SUTURE) ×3 IMPLANT
SUT STRATAFIX 0 PDS 27 VIOLET (SUTURE) ×3
SUT VIC AB 2-0 CT1 27 (SUTURE) ×9
SUT VIC AB 2-0 CT1 TAPERPNT 27 (SUTURE) ×3 IMPLANT
SUTURE STRATFX 0 PDS 27 VIOLET (SUTURE) ×1 IMPLANT
TIBIAL BASE ROT PLAT SZ 8 KNEE (Knees) ×3 IMPLANT
TRAY FOLEY MTR SLVR 16FR STAT (SET/KITS/TRAYS/PACK) ×3 IMPLANT
WATER STERILE IRR 1000ML POUR (IV SOLUTION) ×6 IMPLANT
WRAP KNEE MAXI GEL POST OP (GAUZE/BANDAGES/DRESSINGS) ×3 IMPLANT
YANKAUER SUCT BULB TIP 10FT TU (MISCELLANEOUS) ×3 IMPLANT

## 2018-04-27 NOTE — Plan of Care (Signed)

## 2018-04-27 NOTE — Anesthesia Procedure Notes (Signed)
Anesthesia Regional Block: Adductor canal block   Pre-Anesthetic Checklist: ,, timeout performed, Correct Patient, Correct Site, Correct Laterality, Correct Procedure, Correct Position, site marked, Risks and benefits discussed, pre-op evaluation,  At surgeon's request and post-op pain management  Laterality: Right  Prep: Maximum Sterile Barrier Precautions used, chloraprep       Needles:  Injection technique: Single-shot  Needle Type: Echogenic Stimulator Needle     Needle Length: 9cm  Needle Gauge: 22     Additional Needles:   Procedures:,,,, ultrasound used (permanent image in chart),,,,  Narrative:  Start time: 04/27/2018 9:47 AM End time: 04/27/2018 9:49 AM Injection made incrementally with aspirations every 5 mL.  Performed by: Personally  Anesthesiologist: Brennan Bailey, MD  Additional Notes: Risks, benefits, and alternative discussed. Patient gave consent for procedure. Patient prepped and draped in sterile fashion. Sedation administered, patient remains easily responsive to voice. Relevant anatomy identified with ultrasound guidance. Local anesthetic given in 5cc increments with no signs or symptoms of intravascular injection. No pain or paraesthesias with injection. Patient monitored throughout procedure with signs of LAST or immediate complications. Tolerated well. Ultrasound image placed in chart.  Tawny Asal, MD

## 2018-04-27 NOTE — Anesthesia Postprocedure Evaluation (Signed)
Anesthesia Post Note  Patient: [name] [name]  Procedure(s) Performed: TOTAL KNEE ARTHROPLASTY RIGHT (Right Knee)     Patient location during evaluation: PACU Anesthesia Type: Spinal Level of consciousness: awake and alert Pain management: pain level controlled Vital Signs Assessment: post-procedure vital signs reviewed and stable Respiratory status: spontaneous breathing, nonlabored ventilation and respiratory function stable Cardiovascular status: blood pressure returned to baseline and stable Postop Assessment: no apparent nausea or vomiting and spinal receding Anesthetic complications: no    Last Vitals:  Vitals:   04/27/18 1311 04/27/18 1358  BP: (!) 167/97 (!) 175/98  Pulse: (!) 54 60  Resp: 16   Temp: 36.4 C 36.6 C  SpO2: 100% 100%    Last Pain:  Vitals:   04/27/18 1358  TempSrc: Oral  PainSc:                  Brennan Bailey

## 2018-04-27 NOTE — Anesthesia Procedure Notes (Signed)
Date/Time: 04/27/2018 10:20 AM Performed by: Glory Buff, CRNA Oxygen Delivery Method: Nasal cannula

## 2018-04-27 NOTE — Interval H&P Note (Signed)
History and Physical Interval Note:  04/27/2018 8:27 AM  [name] [name]  has presented today for surgery, with the diagnosis of right knee osteoarthritis  The various methods of treatment have been discussed with the patient and family. After consideration of risks, benefits and other options for treatment, the patient has consented to  Procedure(s) with comments: TOTAL KNEE ARTHROPLASTY (Right) - 37min as a surgical intervention .  The patient's history has been reviewed, patient examined, no change in status, stable for surgery.  I have reviewed the patient's chart and labs.  Questions were answered to the patient's satisfaction.     Pilar Plate Tamura Lasky

## 2018-04-27 NOTE — Evaluation (Signed)
Physical Therapy Evaluation Patient Details Name: [name] [name] MRN: [id number] DOB: 04-30-1940 Today's Date: 04/27/2018   History of Present Illness  78 yo male s/p R TKR on 04/27/18. PMH includes OA, tinnitus, AAA, OSA, PVCs, HTN, CAD, dyslipidemia, CABG 2006.   Clinical Impression   Pt presents with R knee pain, decreased R knee ROM, difficulty performing bed mobility, increased time and effort to perform mobility tasks, and decreased activity tolerance post-surgery. Pt to benefit from acute PT to address deficits. Pt ambulated hallway distance with RW with min guard assist, verbal cuing provided for form and safety. Pt educated on ankle pumps (20/hour) to perform this afternoon/evening to increase circulation, to pt's tolerance and limited by pain. PT to progress mobility as tolerated, and will continue to follow acutely.        Follow Up Recommendations Follow surgeon's recommendation for DC plan and follow-up therapies;Supervision for mobility/OOB(OPPT, to start on 2/28)    Equipment Recommendations  None recommended by PT    Recommendations for Other Services       Precautions / Restrictions Precautions Precautions: Fall Required Braces or Orthoses: Knee Immobilizer - Right Knee Immobilizer - Right: On when out of bed or walking;Discontinue once straight leg raise with < 10 degree lag Restrictions Weight Bearing Restrictions: No Other Position/Activity Restrictions: WBAT       Mobility  Bed Mobility Overal bed mobility: Needs Assistance Bed Mobility: Supine to Sit     Supine to sit: Min assist;HOB elevated     General bed mobility comments: Min assist for RLE lifting and translation to EOB. Exited bed towards the L-hand side. Increased time to scoot to EOB.   Transfers Overall transfer level: Needs assistance Equipment used: Rolling walker (2 wheeled) Transfers: Sit to/from Stand Sit to Stand: Min guard;From elevated surface         General transfer  comment: Min guard for safety, verbal cuing for hand placement when rising (one hand on bed, one on RW).   Ambulation/Gait Ambulation/Gait assistance: Min guard;+2 safety/equipment(chair follow ) Gait Distance (Feet): 60 Feet Assistive device: Rolling walker (2 wheeled) Gait Pattern/deviations: Step-to pattern;Decreased stance time - right;Decreased weight shift to right;Antalgic;Trunk flexed Gait velocity: decr    General Gait Details: Min guard for safety. Verbal cuing for placement inside RW when ambulating, turning with RW, sequencing, and posture/looking forward instead of down when ambulating.  Stairs            Wheelchair Mobility    Modified Rankin (Stroke Patients Only)       Balance Overall balance assessment: Mild deficits observed, not formally tested                                           Pertinent Vitals/Pain Pain Assessment: 0-10 Pain Score: 5  Pain Location: R knee  Pain Descriptors / Indicators: Sore Pain Intervention(s): Repositioned;Limited activity within patient's tolerance;Ice applied;Monitored during session;Premedicated before session    Home Living Family/patient expects to be discharged to:: Private residence Living Arrangements: Spouse/significant other Available Help at Discharge: Family;Available PRN/intermittently Type of Home: House Home Access: Stairs to enter Entrance Stairs-Rails: Right Entrance Stairs-Number of Steps: 6 Home Layout: Two level;Bed/bath upstairs Home Equipment: Cane - single point;Walker - 2 wheels(borrowing RW from friend )      Prior Function Level of Independence: Independent         Comments: Pt enjoys golfing in  his free time      Hand Dominance   Dominant Hand: Right    Extremity/Trunk Assessment   Upper Extremity Assessment Upper Extremity Assessment: Overall WFL for tasks assessed    Lower Extremity Assessment Lower Extremity Assessment: Overall WFL for tasks  assessed;RLE deficits/detail RLE Deficits / Details: suspected post-surgical weakness; able to perform ankle pumps, quad set, SLR with quad lag >10* without lift asssist, heel slides  RLE Sensation: WNL    Cervical / Trunk Assessment Cervical / Trunk Assessment: Normal  Communication   Communication: No difficulties  Cognition Arousal/Alertness: Awake/alert Behavior During Therapy: WFL for tasks assessed/performed Overall Cognitive Status: Within Functional Limits for tasks assessed                                        General Comments      Exercises     Assessment/Plan    PT Assessment Patient needs continued PT services  PT Problem List Decreased strength;Pain;Decreased range of motion;Decreased activity tolerance;Decreased knowledge of use of DME;Decreased balance;Decreased mobility;Decreased safety awareness       PT Treatment Interventions DME instruction;Therapeutic activities;Gait training;Therapeutic exercise;Patient/family education;Balance training;Stair training;Functional mobility training    PT Goals (Current goals can be found in the Care Plan section)  Acute Rehab PT Goals Patient Stated Goal: none stated  PT Goal Formulation: With patient Time For Goal Achievement: 05/04/18 Potential to Achieve Goals: Good    Frequency 7X/week   Barriers to discharge        Co-evaluation               AM-PAC PT "6 Clicks" Mobility  Outcome Measure Help needed turning from your back to your side while in a flat bed without using bedrails?: A Little Help needed moving from lying on your back to sitting on the side of a flat bed without using bedrails?: A Little Help needed moving to and from a bed to a chair (including a wheelchair)?: A Little Help needed standing up from a chair using your arms (e.g., wheelchair or bedside chair)?: A Little Help needed to walk in hospital room?: A Little Help needed climbing 3-5 steps with a railing? : A  Little 6 Click Score: 18    End of Session Equipment Utilized During Treatment: Gait belt;Right knee immobilizer Activity Tolerance: Patient tolerated treatment well;Patient limited by pain Patient left: in chair;with chair alarm set;with call bell/phone within reach;with family/visitor present;with SCD's reapplied Nurse Communication: Mobility status PT Visit Diagnosis: Other abnormalities of gait and mobility (R26.89);Difficulty in walking, not elsewhere classified (R26.2)    Time: 7408-1448 PT Time Calculation (min) (ACUTE ONLY): 25 min   Charges:   PT Evaluation $PT Eval Low Complexity: 1 Low PT Treatments $Gait Training: 8-22 mins      Julien Girt, PT Acute Rehabilitation Services Pager [phone]  Office [phone]  Ger Nicks D Elonda Husky 04/27/2018, 5:12 PM

## 2018-04-27 NOTE — Anesthesia Procedure Notes (Signed)
Spinal  Patient location during procedure: OR Start time: 04/27/2018 10:23 AM End time: 04/27/2018 10:25 AM Staffing Anesthesiologist: Brennan Bailey, MD Resident/CRNA: Glory Buff, CRNA Performed: resident/CRNA  Preanesthetic Checklist Completed: patient identified, site marked, surgical consent, pre-op evaluation, timeout performed, IV checked, risks and benefits discussed and monitors and equipment checked Spinal Block Patient position: sitting Prep: DuraPrep Patient monitoring: heart rate, continuous pulse ox and blood pressure Approach: midline Location: L3-4 Injection technique: single-shot Needle Needle type: Pencan  Needle gauge: 24 G Needle length: 9 cm Assessment Sensory level: T6 Additional Notes Kit expiration date checked and verified.  Sterile prep and drape.  Skin local with 1% lidocaine, stick x 1, - paraesthesia, - heme, + CSF pre and post injection, patient tolerated procedure well.

## 2018-04-27 NOTE — Transfer of Care (Signed)
Immediate Anesthesia Transfer of Care Note  Patient: [name] [name]  Procedure(s) Performed: TOTAL KNEE ARTHROPLASTY RIGHT (Right Knee)  Patient Location: PACU  Anesthesia Type:Spinal  Level of Consciousness: awake, alert  and oriented  Airway & Oxygen Therapy: Patient Spontanous Breathing and Patient connected to face mask oxygen  Post-op Assessment: Report given to RN and Post -op Vital signs reviewed and stable  Post vital signs: Reviewed and stable  Last Vitals:  Vitals Value Taken Time  BP 121/74 04/27/2018 11:58 AM  Temp    Pulse 51 04/27/2018 12:00 PM  Resp 15 04/27/2018 12:00 PM  SpO2 100 % 04/27/2018 12:00 PM  Vitals shown include unvalidated device data.  Last Pain:  Vitals:   04/27/18 1006  TempSrc:   PainSc: 0-No pain         Complications: No apparent anesthesia complications

## 2018-04-27 NOTE — Care Plan (Signed)
Ortho Bundle Case Management Note  Patient Details  Name: [name] [name] MRN: [id number] Date of Birth: 09/02/1940  R TKA scheduled on 04-27-2018 DCP:  Home with spouse.  2 story home with 6 ste. DME:  RW ordered through [place].  Doesn't need a 3-in-1, has elevated toilets. PT:  [facility].  P/O PT eval scheduled on 05-01-2018.                   DME Arranged:  Gilford Rile rolling DME Agency:  [employer]  HH Arranged:  NA [place] Agency:  NA  Additional Comments: Please contact me with any questions of if this plan should need to change.  Marianne Sofia, RN,CCM [facility]  [phone] 04/27/2018, 12:17 PM

## 2018-04-27 NOTE — Discharge Instructions (Addendum)
° °Dr. Frank Aluisio °Total Joint Specialist °[facility] °[address] °[place], [place] [address] °[phone] ° °TOTAL KNEE REPLACEMENT POSTOPERATIVE DIRECTIONS ° °Knee Rehabilitation, Guidelines Following Surgery  °Results after knee surgery are often greatly improved when you follow the exercise, range of motion and muscle strengthening exercises prescribed by your doctor. Safety measures are also important to protect the knee from further injury. Any time any of these exercises cause you to have increased pain or swelling in your knee joint, decrease the amount until you are comfortable again and slowly increase them. If you have problems or questions, call your caregiver or physical therapist for advice.  ° °HOME CARE INSTRUCTIONS  °• Remove items at home which could result in a fall. This includes throw rugs or furniture in walking pathways.  °· ICE to the affected knee every three hours for 30 minutes at a time and then as needed for pain and swelling.  Continue to use ice on the knee for pain and swelling from surgery. You may notice swelling that will progress down to the foot and ankle.  This is normal after surgery.  Elevate the leg when you are not up walking on it.   °· Continue to use the breathing machine which will help keep your temperature down.  It is common for your temperature to cycle up and down following surgery, especially at night when you are not up moving around and exerting yourself.  The breathing machine keeps your lungs expanded and your temperature down. °· Do not place pillow under knee, focus on keeping the knee straight while resting ° °DIET °You may resume your previous home diet once your are discharged from the hospital. ° °DRESSING / WOUND CARE / SHOWERING °You may shower 3 days after surgery, but keep the wounds dry during showering.  You may use an occlusive plastic wrap (Press'n Seal for example), NO SOAKING/SUBMERGING IN THE BATHTUB.  If the bandage  gets wet, change with a clean dry gauze.  If the incision gets wet, pat the wound dry with a clean towel. °You may start showering once you are discharged home but do not submerge the incision under water. Just pat the incision dry and apply a dry gauze dressing on daily. °Change the surgical dressing daily and reapply a dry dressing each time. ° °ACTIVITY °Walk with your walker as instructed. °Use walker as long as suggested by your caregivers. °Avoid periods of inactivity such as sitting longer than an hour when not asleep. This helps prevent blood clots.  °You may resume a sexual relationship in one month or when given the OK by your doctor.  °You may return to work once you are cleared by your doctor.  °Do not drive a car for 6 weeks or until released by you surgeon.  °Do not drive while taking narcotics. ° °WEIGHT BEARING °Weight bearing as tolerated with assist device (walker, cane, etc) as directed, use it as long as suggested by your surgeon or therapist, typically at least 4-6 weeks. ° °POSTOPERATIVE CONSTIPATION PROTOCOL °Constipation - defined medically as fewer than three stools per week and severe constipation as less than one stool per week. ° °One of the most common issues patients have following surgery is constipation.  Even if you have a regular bowel pattern at home, your normal regimen is likely to be disrupted due to multiple reasons following surgery.  Combination of anesthesia, postoperative narcotics, change in appetite and fluid intake all can affect your bowels.    In order to avoid complications following surgery, here are some recommendations in order to help you during your recovery period. ° °Colace (docusate) - Pick up an over-the-counter form of Colace or another stool softener and take twice a day as long as you are requiring postoperative pain medications.  Take with a full glass of water daily.  If you experience loose stools or diarrhea, hold the colace until you stool forms back  up.  If your symptoms do not get better within 1 week or if they get worse, check with your doctor. ° °Dulcolax (bisacodyl) - Pick up over-the-counter and take as directed by the product packaging as needed to assist with the movement of your bowels.  Take with a full glass of water.  Use this product as needed if not relieved by Colace only.  ° °MiraLax (polyethylene glycol) - Pick up over-the-counter to have on hand.  MiraLax is a solution that will increase the amount of water in your bowels to assist with bowel movements.  Take as directed and can mix with a glass of water, juice, soda, coffee, or tea.  Take if you go more than two days without a movement. °Do not use MiraLax more than once per day. Call your doctor if you are still constipated or irregular after using this medication for 7 days in a row. ° °If you continue to have problems with postoperative constipation, please contact the office for further assistance and recommendations.  If you experience "the worst abdominal pain ever" or develop nausea or vomiting, please contact the office immediatly for further recommendations for treatment. ° °ITCHING ° If you experience itching with your medications, try taking only a single pain pill, or even half a pain pill at a time.  You can also use Benadryl over the counter for itching or also to help with sleep.  ° °TED HOSE STOCKINGS °Wear the elastic stockings on both legs for three weeks following surgery during the day but you may remove then at night for sleeping. ° °MEDICATIONS °See your medication summary on the “After Visit Summary” that the nursing staff will review with you prior to discharge.  You may have some home medications which will be placed on hold until you complete the course of blood thinner medication.  It is important for you to complete the blood thinner medication as prescribed by your surgeon.  Continue your approved medications as instructed at time of discharge. ° °PRECAUTIONS °If  you experience chest pain or shortness of breath - call 911 immediately for transfer to the hospital emergency department.  °If you develop a fever greater that 101 F, purulent drainage from wound, increased redness or drainage from wound, foul odor from the wound/dressing, or calf pain - CONTACT YOUR SURGEON.   °                                                °FOLLOW-UP APPOINTMENTS °Make sure you keep all of your appointments after your operation with your surgeon and caregivers. You should call the office at the above phone number and make an appointment for approximately two weeks after the date of your surgery or on the date instructed by your surgeon outlined in the "After Visit Summary". ° ° °RANGE OF MOTION AND STRENGTHENING EXERCISES  °Rehabilitation of the knee is important following a knee injury or   an operation. After just a few days of immobilization, the muscles of the thigh which control the knee become weakened and shrink (atrophy). Knee exercises are designed to build up the tone and strength of the thigh muscles and to improve knee motion. Often times heat used for twenty to thirty minutes before working out will loosen up your tissues and help with improving the range of motion but do not use heat for the first two weeks following surgery. These exercises can be done on a training (exercise) mat, on the floor, on a table or on a bed. Use what ever works the best and is most comfortable for you Knee exercises include:  °• Leg Lifts - While your knee is still immobilized in a splint or cast, you can do straight leg raises. Lift the leg to 60 degrees, hold for 3 sec, and slowly lower the leg. Repeat 10-20 times 2-3 times daily. Perform this exercise against resistance later as your knee gets better.  °• Quad and Hamstring Sets - Tighten up the muscle on the front of the thigh (Quad) and hold for 5-10 sec. Repeat this 10-20 times hourly. Hamstring sets are done by pushing the foot backward against an  object and holding for 5-10 sec. Repeat as with quad sets.  °· Leg Slides: Lying on your back, slowly slide your foot toward your buttocks, bending your knee up off the floor (only go as far as is comfortable). Then slowly slide your foot back down until your leg is flat on the floor again. °· Angel Wings: Lying on your back spread your legs to the side as far apart as you can without causing discomfort.  °A rehabilitation program following serious knee injuries can speed recovery and prevent re-injury in the future due to weakened muscles. Contact your doctor or a physical therapist for more information on knee rehabilitation.  ° °IF YOU ARE TRANSFERRED TO A SKILLED REHAB FACILITY °If the patient is transferred to a skilled rehab facility following release from the hospital, a list of the current medications will be sent to the facility for the patient to continue.  When discharged from the skilled rehab facility, please have the facility set up the patient's Home Health Physical Therapy prior to being released. Also, the skilled facility will be responsible for providing the patient with their medications at time of release from the facility to include their pain medication, the muscle relaxants, and their blood thinner medication. If the patient is still at the rehab facility at time of the two week follow up appointment, the skilled rehab facility will also need to assist the patient in arranging follow up appointment in our office and any transportation needs. ° °MAKE SURE YOU:  °• Understand these instructions.  °• Get help right away if you are not doing well or get worse.  ° ° °Pick up stool softner and laxative for home use following surgery while on pain medications. °Do not submerge incision under water. °Please use good hand washing techniques while changing dressing each day. °May shower starting three days after surgery. °Please use a clean towel to pat the incision dry following showers. °Continue to  use ice for pain and swelling after surgery. °Do not use any lotions or creams on the incision until instructed by your surgeon. ° °Information on my medicine - XARELTO® (Rivaroxaban) ° °This medication education was reviewed with me or my healthcare representative as part of my discharge preparation.  The pharmacist that spoke with me   during my hospital stay was:   ° °Why was Xarelto® prescribed for you? °Xarelto® was prescribed for you to reduce the risk of blood clots forming after orthopedic surgery. The medical term for these abnormal blood clots is venous thromboembolism (VTE). ° °What do you need to know about xarelto® ? °Take your Xarelto® ONCE DAILY at the same time every day. °You may take it either with or without food. ° °If you have difficulty swallowing the tablet whole, you may crush it and mix in applesauce just prior to taking your dose. ° °Take Xarelto® exactly as prescribed by your doctor and DO NOT stop taking Xarelto® without talking to the doctor who prescribed the medication.  Stopping without other VTE prevention medication to take the place of Xarelto® may increase your risk of developing a clot. ° °After discharge, you should have regular check-up appointments with your healthcare provider that is prescribing your Xarelto®.   ° °What do you do if you miss a dose? °If you miss a dose, take it as soon as you remember on the same day then continue your regularly scheduled once daily regimen the next day. Do not take two doses of Xarelto® on the same day.  ° °Important Safety Information °A possible side effect of Xarelto® is bleeding. You should call your healthcare provider right away if you experience any of the following: °? Bleeding from an injury or your nose that does not stop. °? Unusual colored urine (red or dark brown) or unusual colored stools (red or black). °? Unusual bruising for unknown reasons. °? A serious fall or if you hit your head (even if there is no bleeding). ° °Some  medicines may interact with Xarelto® and might increase your risk of bleeding while on Xarelto®. To help avoid this, consult your healthcare provider or pharmacist prior to using any new prescription or non-prescription medications, including herbals, vitamins, non-steroidal anti-inflammatory drugs (NSAIDs) and supplements. ° °This website has more information on Xarelto®: www.xarelto.com. ° ° ° ° °

## 2018-04-27 NOTE — Progress Notes (Signed)
Assisted Dr. Howze with right, ultrasound guided, adductor canal block. Side rails up, monitors on throughout procedure. See vital signs in flow sheet. Tolerated Procedure well.  

## 2018-04-27 NOTE — Op Note (Signed)
OPERATIVE REPORT-TOTAL KNEE ARTHROPLASTY   Pre-operative diagnosis- Osteoarthritis  Right knee(s)  Post-operative diagnosis- Osteoarthritis Right knee(s)  Procedure-  Right  Total Knee Arthroplasty (Depiuy Attune)  Surgeon- Dione Plover. Sonya Gunnoe, MD  Assistant- Theresa Duty, PA-C   Anesthesia-  Adductor canal block and spinal  EBL-10 mL   Drains Hemovac  Tourniquet time- 41 minutes @ 353 mm Hg    Complications- None  Condition-PACU - hemodynamically stable.   Brief Clinical Note  [name] [name] is a 78 y.o. year old male with end stage OA of his right knee with progressively worsening pain and dysfunction. He has constant pain, with activity and at rest and significant functional deficits with difficulties even with ADLs. He has had extensive non-op management including analgesics, injections of cortisone and viscosupplements, and home exercise program, but remains in significant pain with significant dysfunction. Radiographs show bone on bone arthritis medial and patellofemoral. He presents now for right Total Knee Arthroplasty.    Procedure in detail---   The patient is brought into the operating room and positioned supine on the operating table. After successful administration of  Adductor canal block and spinal,   a tourniquet is placed high on the  Right thigh(s) and the lower extremity is prepped and draped in the usual sterile fashion. Time out is performed by the operating team and then the  Right lower extremity is wrapped in Esmarch, knee flexed and the tourniquet inflated to 300 mmHg.       A midline incision is made with a ten blade through the subcutaneous tissue to the level of the extensor mechanism. A fresh blade is used to make a medial parapatellar arthrotomy. Soft tissue over the proximal medial tibia is subperiosteally elevated to the joint line with a knife and into the semimembranosus bursa with a Cobb elevator. Soft tissue over the proximal lateral tibia  is elevated with attention being paid to avoiding the patellar tendon on the tibial tubercle. The patella is everted, knee flexed 90 degrees and the ACL and PCL are removed. Findings are bone on bone medial and patellofemoral with massive global osteophytes        The drill is used to create a starting hole in the distal femur and the canal is thoroughly irrigated with sterile saline to remove the fatty contents. The 5 degree Right  valgus alignment guide is placed into the femoral canal and the distal femoral cutting block is pinned to remove 9 mm off the distal femur. Resection is made with an oscillating saw.      The tibia is subluxed forward and the menisci are removed. The extramedullary alignment guide is placed referencing proximally at the medial aspect of the tibial tubercle and distally along the second metatarsal axis and tibial crest. The block is pinned to remove 37mm off the more deficient medial  side. Resection is made with an oscillating saw. Size 8is the most appropriate size for the tibia and the proximal tibia is prepared with the modular drill and keel punch for that size.      The femoral sizing guide is placed and size 9 is most appropriate. Rotation is marked off the epicondylar axis and confirmed by creating a rectangular flexion gap at 90 degrees. The size 9 cutting block is pinned in this rotation and the anterior, posterior and chamfer cuts are made with the oscillating saw. The intercondylar block is then placed and that cut is made.      Trial size 8 tibial component,  trial size 9 posterior stabilized femur and a 12  mm posterior stabilized rotating platform insert trial is placed. Full extension is achieved with excellent varus/valgus and anterior/posterior balance throughout full range of motion. The patella is everted and thickness measured to be 27  mm. Free hand resection is taken to 15 mm, a 41 template is placed, lug holes are drilled, trial patella is placed, and it tracks  normally. Osteophytes are removed off the posterior femur with the trial in place. All trials are removed and the cut bone surfaces prepared with pulsatile lavage. Cement is mixed and once ready for implantation, the size 8 tibial implant, size  9 posterior stabilized femoral component, and the size 41 patella are cemented in place and the patella is held with the clamp. The trial insert is placed and the knee held in full extension. The Exparel (20 ml mixed with 60 ml saline) is injected into the extensor mechanism, posterior capsule, medial and lateral gutters and subcutaneous tissues.  All extruded cement is removed and once the cement is hard the permanent 12 mm posterior stabilized rotating platform insert is placed into the tibial tray.      The wound is copiously irrigated with saline solution and the extensor mechanism closed over a hemovac drain with #1 V-loc suture. The tourniquet is released for a total tourniquet time of 41  minutes. Flexion against gravity is 140 degrees and the patella tracks normally. Subcutaneous tissue is closed with 2.0 vicryl and subcuticular with running 4.0 Monocryl. The incision is cleaned and dried and steri-strips and a bulky sterile dressing are applied. The limb is placed into a knee immobilizer and the patient is awakened and transported to recovery in stable condition.      Please note that a surgical assistant was a medical necessity for this procedure in order to perform it in a safe and expeditious manner. Surgical assistant was necessary to retract the ligaments and vital neurovascular structures to prevent injury to them and also necessary for proper positioning of the limb to allow for anatomic placement of the prosthesis.   Dione Plover Shatiqua Heroux, MD    04/27/2018, 11:35 AM

## 2018-04-28 ENCOUNTER — Encounter (HOSPITAL_COMMUNITY): Payer: Self-pay | Admitting: Orthopedic Surgery

## 2018-04-28 DIAGNOSIS — M1711 Unilateral primary osteoarthritis, right knee: Secondary | ICD-10-CM | POA: Diagnosis not present

## 2018-04-28 DIAGNOSIS — I10 Essential (primary) hypertension: Secondary | ICD-10-CM | POA: Diagnosis not present

## 2018-04-28 DIAGNOSIS — Z7982 Long term (current) use of aspirin: Secondary | ICD-10-CM | POA: Diagnosis not present

## 2018-04-28 DIAGNOSIS — Z951 Presence of aortocoronary bypass graft: Secondary | ICD-10-CM | POA: Diagnosis not present

## 2018-04-28 DIAGNOSIS — M1710 Unilateral primary osteoarthritis, unspecified knee: Secondary | ICD-10-CM | POA: Diagnosis present

## 2018-04-28 DIAGNOSIS — G4730 Sleep apnea, unspecified: Secondary | ICD-10-CM | POA: Diagnosis not present

## 2018-04-28 DIAGNOSIS — M179 Osteoarthritis of knee, unspecified: Secondary | ICD-10-CM | POA: Diagnosis present

## 2018-04-28 DIAGNOSIS — I2510 Atherosclerotic heart disease of native coronary artery without angina pectoris: Secondary | ICD-10-CM | POA: Diagnosis not present

## 2018-04-28 LAB — BASIC METABOLIC PANEL
ANION GAP: 5 (ref 5–15)
BUN: 18 mg/dL (ref 8–23)
CHLORIDE: 105 mmol/L (ref 98–111)
CO2: 26 mmol/L (ref 22–32)
Calcium: 8.4 mg/dL — ABNORMAL LOW (ref 8.9–10.3)
Creatinine, Ser: 0.75 mg/dL (ref 0.61–1.24)
GFR calc Af Amer: >60 mL/min (ref >60)
GFR calc non Af Amer: >60 mL/min (ref >60)
Glucose, Bld: 166 mg/dL — ABNORMAL HIGH (ref 70–99)
POTASSIUM: 3.9 mmol/L (ref 3.5–5.1)
Sodium: 136 mmol/L (ref 135–145)

## 2018-04-28 LAB — CBC
HCT: 35.1 % — ABNORMAL LOW (ref 39.0–52.0)
Hemoglobin: 11 g/dL — ABNORMAL LOW (ref 13.0–17.0)
MCH: 31.3 pg (ref 26.0–34.0)
MCHC: 31.3 g/dL (ref 30.0–36.0)
MCV: 100 fL (ref 80.0–100.0)
NRBC: 0 % (ref 0.0–0.2)
Platelets: 118 10*3/uL — ABNORMAL LOW (ref 150–400)
RBC: 3.51 MIL/uL — ABNORMAL LOW (ref 4.22–5.81)
RDW: 13.3 % (ref 11.5–15.5)
WBC: 13.9 10*3/uL — ABNORMAL HIGH (ref 4.0–10.5)

## 2018-04-28 MED ORDER — OXYCODONE HCL 5 MG PO TABS: 5.0000 mg | ORAL_TABLET | Freq: Four times a day (QID) | ORAL | 0 refills | Status: DC | PRN

## 2018-04-28 MED ORDER — METHOCARBAMOL 500 MG PO TABS: 500.0000 mg | ORAL_TABLET | Freq: Four times a day (QID) | ORAL | 0 refills | Status: DC | PRN

## 2018-04-28 MED ORDER — GABAPENTIN 300 MG PO CAPS: 300.0000 mg | ORAL_CAPSULE | Freq: Three times a day (TID) | ORAL | 0 refills | Status: DC

## 2018-04-28 MED ORDER — RIVAROXABAN 10 MG PO TABS: 10.0000 mg | ORAL_TABLET | Freq: Every day | ORAL | 0 refills | Status: DC

## 2018-04-28 MED ORDER — TRAMADOL HCL 50 MG PO TABS: 50.0000 mg | ORAL_TABLET | Freq: Four times a day (QID) | ORAL | 0 refills | Status: DC | PRN

## 2018-04-28 NOTE — Progress Notes (Signed)
   Subjective: 1 Day Post-Op Procedure(s) (LRB): TOTAL KNEE ARTHROPLASTY RIGHT (Right) Patient reports pain as mild.   Patient seen in rounds by Dr. Wynelle Link. Patient is well, and has had no acute complaints or problems. No issues overnight. States he is ready to go home. Denies chest pain, SOB, or calf pain. Foley catheter removed this AM.  We will continue therapy today.   Objective: Vital signs in last 24 hours: Temp:  [97.4 F (36.3 C)-98.7 F (37.1 C)] 97.4 F (36.3 C) (02/25 0548) Pulse Rate:  [50-72] 57 (02/25 0548) Resp:  [13-20] 17 (02/25 0548) BP: (118-180)/(62-98) 122/66 (02/25 0548) SpO2:  [97 %-100 %] 100 % (02/25 0548) Weight:  [90 kg] 90 kg (02/24 0800)  Intake/Output from previous day:  Intake/Output Summary (Last 24 hours) at 04/28/2018 0724 Last data filed at 04/28/2018 4268 Gross per 24 hour  Intake 3353.8 ml  Output 4240 ml  Net -886.2 ml    Labs: Recent Labs    04/28/18 0523  HGB 11.0*   Recent Labs    04/28/18 0523  WBC 13.9*  RBC 3.51*  HCT 35.1*  PLT 118*   Recent Labs    04/28/18 0523  NA 136  K 3.9  CL 105  CO2 26  BUN 18  CREATININE 0.75  GLUCOSE 166*  CALCIUM 8.4*   Exam: General - Patient is Alert and Oriented Extremity - Neurologically intact Neurovascular intact Sensation intact distally Dorsiflexion/Plantar flexion intact Dressing - dressing C/D/I Motor Function - intact, moving foot and toes well on exam.   Past Medical History:  Diagnosis Date  . CAD (coronary artery disease)   . Colon polyps   . Dyslipidemia   . Herniated disc   . Hiatal hernia   . HTN (hypertension)   . Hyperlipidemia   . Kidney stones     Assessment/Plan: 1 Day Post-Op Procedure(s) (LRB): TOTAL KNEE ARTHROPLASTY RIGHT (Right) Active Problems:   OA (osteoarthritis) of knee  Estimated body mass index is 26.19 kg/m as calculated from the following:   Height as of this encounter: 6\' 1"  (1.854 m).   Weight as of this encounter: 90  kg. Advance diet Up with therapy D/C IV fluids  Anticipated LOS equal to or greater than 2 midnights due to - Age 76 and older with one or more of the following:  - Obesity  - Expected need for hospital services (PT, OT, Nursing) required for safe  discharge  - Anticipated need for postoperative skilled nursing care or inpatient rehab  - Active co-morbidities: Coronary Artery Disease OR   - Unanticipated findings during/Post Surgery: None  - Patient is a high risk of re-admission due to: None    DVT Prophylaxis - Xarelto Weight bearing as tolerated. D/C O2 and pulse ox and try on room air. Hemovac pulled without difficulty, will continue therapy today.  Plan is to go Home after hospital stay. Plan for discharge today once meeting goals with therapy. Scheduled for outpatient physical therapy at [facility] in [place]. Follow-up in the office in 2 weeks.   Theresa Duty, PA-C Orthopedic Surgery 04/28/2018, 7:24 AM

## 2018-04-28 NOTE — Care Management Note (Addendum)
Case Management Note  Patient Details  Name: Alyssa Mancera MRN: [id number] Date of Birth: 1940-04-18  Subjective/Objective:                  Discharge planning  Action/Plan: Has needed equipment Pt -[facility].  Expected Discharge Date:  04/28/18               Expected Discharge Plan:  [place]  In-House Referral:     Discharge planning Services  CM Consult  Post Acute Care Choice:  Home Health Choice offered to:  Patient  DME Arranged:  Walker rolling DME Agency:  [employer]  HH Arranged:  NA HH Agency:  NA  Status of Service:  Completed, signed off  If discussed at [place] of Stay Meetings, dates discussed:    Additional Comments:  Leeroy Cha, RN 04/28/2018, 10:26 AM

## 2018-04-28 NOTE — Progress Notes (Signed)
Physical Therapy Treatment Patient Details Name: [name] [name] MRN: [id number] DOB: 02-Oct-1940 Today's Date: 04/28/2018    History of Present Illness 78 yo male s/p R TKR on 04/27/18. PMH includes OA, tinnitus, AAA, OSA, PVCs, HTN, CAD, dyslipidemia, CABG 2006.     PT Comments    Pt is progressing well with mobility, he demonstrates good understanding of TKA HEP.  He ambulated 65' with RW, distance limited by onset of dizziness and diaphoresis. Assisted pt to recliner, BP 89/53, HR 55, SaO2 98% on room air. RN notified, IV fluids resumed. Will return this afternoon for stair training.   Follow Up Recommendations  Follow surgeon's recommendation for DC plan and follow-up therapies;Supervision for mobility/OOB     Equipment Recommendations  None recommended by PT    Recommendations for Other Services       Precautions / Restrictions Precautions Precautions: Fall Required Braces or Orthoses: Knee Immobilizer - Right Knee Immobilizer - Right: On when out of bed or walking;Discontinue once straight leg raise with < 10 degree lag Restrictions Weight Bearing Restrictions: No Other Position/Activity Restrictions: WBAT     Mobility  Bed Mobility Overal bed mobility: Needs Assistance Bed Mobility: Supine to Sit     Supine to sit: HOB elevated;Modified independent (Device/Increase time)        Transfers Overall transfer level: Needs assistance Equipment used: Rolling walker (2 wheeled) Transfers: Sit to/from Stand Sit to Stand: Min guard;From elevated surface         General transfer comment: Min guard for safety, verbal cuing for hand placement when rising (one hand on bed, one on RW).   Ambulation/Gait Ambulation/Gait assistance: Min guard(chair follow ) Gait Distance (Feet): 80 Feet Assistive device: Rolling walker (2 wheeled) Gait Pattern/deviations: Step-to pattern;Decreased stance time - right;Decreased weight shift to right;Antalgic;Trunk flexed Gait  velocity: decr    General Gait Details: distance limited by onset of dizziness, assisted pt to recliner, BP in reclined position 89/53, HR 55, SaO2 98% on room air, RN notified, IV fluids resumed   Stairs             Wheelchair Mobility    Modified Rankin (Stroke Patients Only)       Balance Overall balance assessment: Modified Independent                                          Cognition Arousal/Alertness: Awake/alert Behavior During Therapy: WFL for tasks assessed/performed Overall Cognitive Status: Within Functional Limits for tasks assessed                                        Exercises Total Joint Exercises Ankle Circles/Pumps: AROM;Both;10 reps;Supine Quad Sets: AROM;Both;5 reps;Supine Towel Squeeze: AROM;Both;5 reps;Supine Short Arc Quad: AROM;Right;10 reps;Supine Heel Slides: AAROM;Right;10 reps;Supine Hip ABduction/ADduction: AROM;Right;10 reps;Supine Straight Leg Raises: AROM;Right;10 reps;Supine Long Arc Quad: AROM;Right;10 reps;Seated Knee Flexion: AAROM;AROM;Right;10 reps;Seated Goniometric ROM: 10-95* AAROM R knee    General Comments        Pertinent Vitals/Pain Pain Score: 3  Pain Location: R knee  Pain Descriptors / Indicators: Sore Pain Intervention(s): Limited activity within patient's tolerance;Monitored during session;Premedicated before session;Ice applied    Home Living                      Prior Function  PT Goals (current goals can now be found in the care plan section) Acute Rehab PT Goals Patient Stated Goal: golf PT Goal Formulation: With patient Time For Goal Achievement: 05/04/18 Potential to Achieve Goals: Good Progress towards PT goals: Progressing toward goals    Frequency    7X/week      PT Plan Current plan remains appropriate    Co-evaluation              AM-PAC PT "6 Clicks" Mobility   Outcome Measure  Help needed turning from your back  to your side while in a flat bed without using bedrails?: None Help needed moving from lying on your back to sitting on the side of a flat bed without using bedrails?: None Help needed moving to and from a bed to a chair (including a wheelchair)?: None Help needed standing up from a chair using your arms (e.g., wheelchair or bedside chair)?: None Help needed to walk in hospital room?: None Help needed climbing 3-5 steps with a railing? : A Little 6 Click Score: 23    End of Session Equipment Utilized During Treatment: Gait belt Activity Tolerance: Treatment limited secondary to medical complications (Comment)(dizzy/diaphorectic with walking, hypotensive) Patient left: in chair;with chair alarm set;with call bell/phone within reach;with nursing/sitter in room Nurse Communication: Mobility status;Other (comment)(dizzy, hypotensive) PT Visit Diagnosis: Other abnormalities of gait and mobility (R26.89);Difficulty in walking, not elsewhere classified (R26.2);Pain Pain - Right/Left: Right Pain - part of body: Knee     Time: 1314-3888 PT Time Calculation (min) (ACUTE ONLY): 47 min  Charges:  $Gait Training: 8-22 mins $Therapeutic Exercise: 8-22 mins $Therapeutic Activity: 8-22 mins                    Blondell Reveal Kistler PT 04/28/2018  Acute Rehabilitation Services Pager [phone] Office [phone]

## 2018-04-28 NOTE — Care Management Obs Status (Signed)
[facility] NOTIFICATION   Patient Details  Name: [name] [name] MRN: [id number] Date of Birth: [birth year]   Medicare Observation Status Notification Given:  Yes    Leeroy Cha, RN 04/28/2018, 12:16 PM

## 2018-04-28 NOTE — Progress Notes (Signed)
Physical Therapy Treatment Patient Details Name: [name] [name] MRN: [id number] DOB: 16-Nov-1940 Today's Date: 04/28/2018    History of Present Illness 78 yo male s/p R TKR on 04/27/18. PMH includes OA, tinnitus, AAA, OSA, PVCs, HTN, CAD, dyslipidemia, CABG 2006.     PT Comments    Stair training completed. Dizziness resolved. PT goals met, pt is ready to DC home from PT standpoint.   Follow Up Recommendations  Follow surgeon's recommendation for DC plan and follow-up therapies;Supervision for mobility/OOB     Equipment Recommendations  None recommended by PT    Recommendations for Other Services       Precautions / Restrictions Precautions Precautions: Fall Required Braces or Orthoses: Knee Immobilizer - Right Knee Immobilizer - Right: On when out of bed or walking;Discontinue once straight leg raise with < 10 degree lag Restrictions Weight Bearing Restrictions: No Other Position/Activity Restrictions: WBAT     Mobility  Bed Mobility Overal bed mobility: Needs Assistance Bed Mobility: Supine to Sit     Supine to sit: HOB elevated;Modified independent (Device/Increase time)     General bed mobility comments: up in chair  Transfers Overall transfer level: Modified independent Equipment used: Rolling walker (2 wheeled) Transfers: Sit to/from Stand Sit to Stand: Min guard;From elevated surface;Supervision         General transfer comment: VCs hand placement  Ambulation/Gait Ambulation/Gait assistance: Supervision(chair follow ) Gait Distance (Feet): 125 Feet Assistive device: Rolling walker (2 wheeled) Gait Pattern/deviations: Step-to pattern;Decreased stance time - right;Decreased weight shift to right;Antalgic Gait velocity: decr    General Gait Details: steady with RW, no dizziness   Stairs Stairs: Yes Stairs assistance: Min guard Stair Management: One rail Right;Forwards;Step to pattern;With cane Number of Stairs: 9 General stair comments: VCs  sequencing, spouse present   Wheelchair Mobility    Modified Rankin (Stroke Patients Only)       Balance Overall balance assessment: Modified Independent                                          Cognition Arousal/Alertness: Awake/alert Behavior During Therapy: WFL for tasks assessed/performed Overall Cognitive Status: Within Functional Limits for tasks assessed                                        Exercises Total Joint Exercises  Heel Slides: AAROM;Right;10 reps;Supine  Long Arc Quad: AROM;Right;10 reps;Seated Knee Flexion: AAROM;AROM;Right;10 reps;Seated Goniometric ROM: 10-95* AAROM R knee    General Comments        Pertinent Vitals/Pain Pain Score: 5  Pain Location: R knee  Pain Descriptors / Indicators: Sore Pain Intervention(s): Limited activity within patient's tolerance;Monitored during session;Premedicated before session;Ice applied    Home Living                      Prior Function            PT Goals (current goals can now be found in the care plan section) Acute Rehab PT Goals Patient Stated Goal: golf PT Goal Formulation: With patient Time For Goal Achievement: 05/04/18 Potential to Achieve Goals: Good Progress towards PT goals: Goals met/education completed, patient discharged from PT    Frequency    7X/week      PT Plan Current plan remains appropriate  Co-evaluation              AM-PAC PT "6 Clicks" Mobility   Outcome Measure  Help needed turning from your back to your side while in a flat bed without using bedrails?: None Help needed moving from lying on your back to sitting on the side of a flat bed without using bedrails?: None Help needed moving to and from a bed to a chair (including a wheelchair)?: None Help needed standing up from a chair using your arms (e.g., wheelchair or bedside chair)?: None Help needed to walk in hospital room?: None Help needed climbing 3-5 steps  with a railing? : A Little 6 Click Score: 23    End of Session Equipment Utilized During Treatment: Gait belt Activity Tolerance: Patient tolerated treatment well Patient left: in chair;with call bell/phone within reach;with family/visitor present Nurse Communication: Mobility status PT Visit Diagnosis: Other abnormalities of gait and mobility (R26.89);Difficulty in walking, not elsewhere classified (R26.2);Pain Pain - Right/Left: Right Pain - part of body: Knee     Time: 8478-4128 PT Time Calculation (min) (ACUTE ONLY): 29 min  Charges:  $Gait Training: 8-22 mins  $Self Care/Home Management: 8-22                     Blondell Reveal Kistler PT 04/28/2018  Acute Rehabilitation Services Pager [phone] Office [phone]

## 2018-04-28 NOTE — Care Management CC44 (Signed)
Condition Code 44 Documentation Completed  Patient Details  Name: [name] [name] MRN: [id number] Date of Birth: 10-Feb-1941   Condition Code 44 given:  Yes Patient signature on Condition Code 44 notice:  Yes Documentation of 2 MD's agreement:  Yes Code 44 added to claim:  Yes    Leeroy Cha, RN 04/28/2018, 12:16 PM

## 2018-04-29 NOTE — Discharge Summary (Signed)
Physician Discharge Summary   Patient ID: [name] [name] MRN: [id number] DOB/AGE: April 07, 1940 78 y.o.  Admit date: 04/27/2018 Discharge date: 04/28/2018  Primary Diagnosis: Osteoarthritis, right knee   Admission Diagnoses:  Past Medical History:  Diagnosis Date  . CAD (coronary artery disease)   . Colon polyps   . Dyslipidemia   . Herniated disc   . Hiatal hernia   . HTN (hypertension)   . Hyperlipidemia   . Kidney stones    Discharge Diagnoses:   Active Problems:   OA (osteoarthritis) of knee   Osteoarthritis, knee  Estimated body mass index is 26.19 kg/m as calculated from the following:   Height as of this encounter: 6\' 1"  (1.854 m).   Weight as of this encounter: 90 kg.  Procedure:  Procedure(s) (LRB): TOTAL KNEE ARTHROPLASTY RIGHT (Right)   Consults: None  HPI: [name] [name] is a 78 y.o. year old male with end stage OA of his right knee with progressively worsening pain and dysfunction. He has constant pain, with activity and at rest and significant functional deficits with difficulties even with ADLs. He has had extensive non-op management including analgesics, injections of cortisone and viscosupplements, and home exercise program, but remains in significant pain with significant dysfunction. Radiographs show bone on bone arthritis medial and patellofemoral. He presents now for right Total Knee Arthroplasty.   Laboratory Data: Admission on 04/27/2018, Discharged on 04/28/2018  Component Date Value Ref Range Status  . WBC 04/28/2018 13.9* 4.0 - 10.5 K/uL Final  . RBC 04/28/2018 3.51* 4.22 - 5.81 MIL/uL Final  . Hemoglobin 04/28/2018 11.0* 13.0 - 17.0 g/dL Final  . HCT 04/28/2018 35.1* 39.0 - 52.0 % Final  . MCV 04/28/2018 100.0  80.0 - 100.0 fL Final  . MCH 04/28/2018 31.3  26.0 - 34.0 pg Final  . MCHC 04/28/2018 31.3  30.0 - 36.0 g/dL Final  . RDW 04/28/2018 13.3  11.5 - 15.5 % Final  . Platelets 04/28/2018 118* 150 - 400 K/uL Final   Comment:  REPEATED TO VERIFY PLATELET COUNT CONFIRMED BY SMEAR SPECIMEN CHECKED FOR CLOTS Immature Platelet Fraction may be clinically indicated, consider ordering this additional test [id number]   . nRBC 04/28/2018 0.0  0.0 - 0.2 % Final   Performed at [facility] [address] [place], [place] [address]  . Sodium 04/28/2018 136  135 - 145 mmol/L Final  . Potassium 04/28/2018 3.9  3.5 - 5.1 mmol/L Final  . Chloride 04/28/2018 105  98 - 111 mmol/L Final  . CO2 04/28/2018 26  22 - 32 mmol/L Final  . Glucose, Bld 04/28/2018 166* 70 - 99 mg/dL Final  . BUN 04/28/2018 18  8 - 23 mg/dL Final  . Creatinine, Ser 04/28/2018 0.75  0.61 - 1.24 mg/dL Final  . Calcium 04/28/2018 8.4* 8.9 - 10.3 mg/dL Final  . GFR calc non Af Amer 04/28/2018 >60  >60 mL/min Final  . GFR calc Af Amer 04/28/2018 >60  >60 mL/min Final  . Anion gap 04/28/2018 5  5 - 15 Final   Performed at [facility], [place] [address] [place], [place] [address]  Hospital Outpatient Visit on 04/23/2018  Component Date Value Ref Range Status  . aPTT 04/23/2018 21* 24 - 36 seconds Final   Performed at [facility], [place] [address], [place], [place] [address]  . WBC 04/23/2018 6.0  4.0 - 10.5 K/uL Final  . RBC 04/23/2018 4.60  4.22 - 5.81 MIL/uL Final  . Hemoglobin  04/23/2018 14.2  13.0 - 17.0 g/dL Final  . HCT 04/23/2018 45.4  39.0 - 52.0 % Final  . MCV 04/23/2018 98.7  80.0 - 100.0 fL Final  . MCH 04/23/2018 30.9  26.0 - 34.0 pg Final  . MCHC 04/23/2018 31.3  30.0 - 36.0 g/dL Final  . RDW 04/23/2018 13.2  11.5 - 15.5 % Final  . Platelets 04/23/2018 156  150 - 400 K/uL Final  . nRBC 04/23/2018 0.0  0.0 - 0.2 % Final   Performed at [facility], [place] [address], [place], [place] [address]  . Sodium 04/23/2018 140  135 - 145 mmol/L Final  . Potassium 04/23/2018 4.1  3.5 - 5.1 mmol/L Final  . Chloride 04/23/2018 107  98 - 111 mmol/L Final  . CO2 04/23/2018 27  22 -  32 mmol/L Final  . Glucose, Bld 04/23/2018 101* 70 - 99 mg/dL Final  . BUN 04/23/2018 20  8 - 23 mg/dL Final  . Creatinine, Ser 04/23/2018 0.81  0.61 - 1.24 mg/dL Final  . Calcium 04/23/2018 9.2  8.9 - 10.3 mg/dL Final  . Total Protein 04/23/2018 7.0  6.5 - 8.1 g/dL Final  . Albumin 04/23/2018 4.5  3.5 - 5.0 g/dL Final  . AST 04/23/2018 24  15 - 41 U/L Final  . ALT 04/23/2018 20  0 - 44 U/L Final  . Alkaline Phosphatase 04/23/2018 63  38 - 126 U/L Final  . Total Bilirubin 04/23/2018 0.6  0.3 - 1.2 mg/dL Final  . GFR calc non Af Amer 04/23/2018 >60  >60 mL/min Final  . GFR calc Af Amer 04/23/2018 >60  >60 mL/min Final  . Anion gap 04/23/2018 6  5 - 15 Final   Performed at [facility], [place] [address], [place], [place] [address]  . Prothrombin Time 04/23/2018 13.5  11.4 - 15.2 seconds Final  . INR 04/23/2018 1.04   Final   Performed at [facility] [address], [place], [place] [address]  . ABO/RH(D) 04/23/2018 A POS   Final  . Antibody Screen 04/23/2018 NEG   Final  . Sample Expiration 04/23/2018 04/30/2018   Final  . Extend sample reason 04/23/2018    Final                   Value:NO TRANSFUSIONS OR PREGNANCY IN THE PAST 3 MONTHS Performed at [facility] [address] [place], [place] [address]   . MRSA, PCR 04/23/2018 NEGATIVE  NEGATIVE Final  . Staphylococcus aureus 04/23/2018 NEGATIVE  NEGATIVE Final   Comment: (NOTE) The Xpert SA Assay (FDA approved for NASAL specimens in patients 19 years of age and older), is one component of a comprehensive surveillance program. It is not intended to diagnose infection nor to guide or monitor treatment. Performed at [facility], [place] [address] [place], [place] [address]   . ABO/RH(D) 04/23/2018    Final                   Value:A POS Performed at [facility], [place] [address], [place], [place] [address]      X-Rays:No results  found.  EKG: Orders placed or performed in visit on 03/17/18  . EKG 12-Lead     Hospital Course: [name] [name] is a 78 y.o. who was admitted to [facility]. They were brought to the operating room on 04/27/2018 and underwent Procedure(s): TOTAL KNEE ARTHROPLASTY RIGHT.  Patient tolerated  the procedure well and was later transferred to the recovery room and then to the orthopaedic floor for postoperative care. They were given PO and IV analgesics for pain control following their surgery. They were given 24 hours of postoperative antibiotics of  Anti-infectives (From admission, onward)   Start     Dose/Rate Route Frequency Ordered Stop   04/27/18 1630  ceFAZolin (ANCEF) IVPB 2g/100 mL premix     2 g 200 mL/hr over 30 Minutes Intravenous Every 6 hours 04/27/18 1308 04/27/18 2254   04/27/18 0745  ceFAZolin (ANCEF) IVPB 2g/100 mL premix     2 g 200 mL/hr over 30 Minutes Intravenous On call to O.R. 04/27/18 9767 04/27/18 1056     and started on DVT prophylaxis in the form of Xarelto.   PT and OT were ordered for total joint protocol. Discharge planning consulted to help with postop disposition and equipment needs.  Patient had a good night on the evening of surgery. They started to get up OOB with therapy on POD #1. Pt was seen during rounds and was ready to go home pending progress with therapy. Hemovac drain was pulled without difficulty. He worked with therapy on POD #1 and was meeting his goals. Pt was discharged to home later that day in stable condition.  Diet: Cardiac diet Activity: WBAT Follow-up: in 2 weeks Disposition: Home with outpatient PT at [facility]) Discharged Condition: stable   Discharge Instructions    Call MD / Call 911   Complete by:  As directed    If you experience chest pain or shortness of breath, CALL 911 and be transported to the hospital emergency room.  If you develope a fever above 101 F, pus (white drainage) or increased drainage or redness at  the wound, or calf pain, call your surgeon's office.   Change dressing   Complete by:  As directed    Change dressing on Wednesday, then change the dressing daily with sterile 4 x 4 inch gauze dressing and apply TED hose.   Constipation Prevention   Complete by:  As directed    Drink plenty of fluids.  Prune juice may be helpful.  You may use a stool softener, such as Colace (over the counter) 100 mg twice a day.  Use MiraLax (over the counter) for constipation as needed.   Diet - low sodium heart healthy   Complete by:  As directed    Discharge instructions   Complete by:  As directed    Dr. Gaynelle Arabian Total Joint Specialist [facility] [address], [place], [place] [address] [phone]  TOTAL KNEE REPLACEMENT POSTOPERATIVE DIRECTIONS  Knee Rehabilitation, Guidelines Following Surgery  Results after knee surgery are often greatly improved when you follow the exercise, range of motion and muscle strengthening exercises prescribed by your doctor. Safety measures are also important to protect the knee from further injury. Any time any of these exercises cause you to have increased pain or swelling in your knee joint, decrease the amount until you are comfortable again and slowly increase them. If you have problems or questions, call your caregiver or physical therapist for advice.   HOME CARE INSTRUCTIONS  Remove items at home which could result in a fall. This includes throw rugs or furniture in walking pathways.  ICE to the affected knee every three hours for 30 minutes at a time and then as needed for pain and swelling.  Continue to use ice on the knee for pain and swelling  from surgery. You may notice swelling that will progress down to the foot and ankle.  This is normal after surgery.  Elevate the leg when you are not up walking on it.   Continue to use the breathing machine which will help keep your temperature down.  It is common for your temperature to cycle up  and down following surgery, especially at night when you are not up moving around and exerting yourself.  The breathing machine keeps your lungs expanded and your temperature down. Do not place pillow under knee, focus on keeping the knee straight while resting   DIET You may resume your previous home diet once your are discharged from the hospital.  DRESSING / WOUND CARE / SHOWERING You may shower 3 days after surgery, but keep the wounds dry during showering.  You may use an occlusive plastic wrap (Press'n Seal for example), NO SOAKING/SUBMERGING IN THE BATHTUB.  If the bandage gets wet, change with a clean dry gauze.  If the incision gets wet, pat the wound dry with a clean towel. You may start showering once you are discharged home but do not submerge the incision under water. Just pat the incision dry and apply a dry gauze dressing on daily. Change the surgical dressing daily and reapply a dry dressing each time.  ACTIVITY Walk with your walker as instructed. Use walker as long as suggested by your caregivers. Avoid periods of inactivity such as sitting longer than an hour when not asleep. This helps prevent blood clots.  You may resume a sexual relationship in one month or when given the OK by your doctor.  You may return to work once you are cleared by your doctor.  Do not drive a car for 6 weeks or until released by you surgeon.  Do not drive while taking narcotics.  WEIGHT BEARING Weight bearing as tolerated with assist device (walker, cane, etc) as directed, use it as long as suggested by your surgeon or therapist, typically at least 4-6 weeks.  POSTOPERATIVE CONSTIPATION PROTOCOL Constipation - defined medically as fewer than three stools per week and severe constipation as less than one stool per week.  One of the most common issues patients have following surgery is constipation.  Even if you have a regular bowel pattern at home, your normal regimen is likely to be disrupted  due to multiple reasons following surgery.  Combination of anesthesia, postoperative narcotics, change in appetite and fluid intake all can affect your bowels.  In order to avoid complications following surgery, here are some recommendations in order to help you during your recovery period.  Colace (docusate) - Pick up an over-the-counter form of Colace or another stool softener and take twice a day as long as you are requiring postoperative pain medications.  Take with a full glass of water daily.  If you experience loose stools or diarrhea, hold the colace until you stool forms back up.  If your symptoms do not get better within 1 week or if they get worse, check with your doctor.  Dulcolax (bisacodyl) - Pick up over-the-counter and take as directed by the product packaging as needed to assist with the movement of your bowels.  Take with a full glass of water.  Use this product as needed if not relieved by Colace only.   MiraLax (polyethylene glycol) - Pick up over-the-counter to have on hand.  MiraLax is a solution that will increase the amount of water in your bowels to assist with bowel movements.  Take as directed and can mix with a glass of water, juice, soda, coffee, or tea.  Take if you go more than two days without a movement. Do not use MiraLax more than once per day. Call your doctor if you are still constipated or irregular after using this medication for 7 days in a row.  If you continue to have problems with postoperative constipation, please contact the office for further assistance and recommendations.  If you experience "the worst abdominal pain ever" or develop nausea or vomiting, please contact the office immediatly for further recommendations for treatment.  ITCHING  If you experience itching with your medications, try taking only a single pain pill, or even half a pain pill at a time.  You can also use Benadryl over the counter for itching or also to help with sleep.   TED HOSE  STOCKINGS Wear the elastic stockings on both legs for three weeks following surgery during the day but you may remove then at night for sleeping.  MEDICATIONS See your medication summary on the "After Visit Summary" that the nursing staff will review with you prior to discharge.  You may have some home medications which will be placed on hold until you complete the course of blood thinner medication.  It is important for you to complete the blood thinner medication as prescribed by your surgeon.  Continue your approved medications as instructed at time of discharge.  PRECAUTIONS If you experience chest pain or shortness of breath - call 911 immediately for transfer to the hospital emergency department.  If you develop a fever greater that 101 F, purulent drainage from wound, increased redness or drainage from wound, foul odor from the wound/dressing, or calf pain - CONTACT YOUR SURGEON.                                                   FOLLOW-UP APPOINTMENTS Make sure you keep all of your appointments after your operation with your surgeon and caregivers. You should call the office at the above phone number and make an appointment for approximately two weeks after the date of your surgery or on the date instructed by your surgeon outlined in the "After Visit Summary".   RANGE OF MOTION AND STRENGTHENING EXERCISES  Rehabilitation of the knee is important following a knee injury or an operation. After just a few days of immobilization, the muscles of the thigh which control the knee become weakened and shrink (atrophy). Knee exercises are designed to build up the tone and strength of the thigh muscles and to improve knee motion. Often times heat used for twenty to thirty minutes before working out will loosen up your tissues and help with improving the range of motion but do not use heat for the first two weeks following surgery. These exercises can be done on a training (exercise) mat, on the floor, on  a table or on a bed. Use what ever works the best and is most comfortable for you Knee exercises include:  Leg Lifts - While your knee is still immobilized in a splint or cast, you can do straight leg raises. Lift the leg to 60 degrees, hold for 3 sec, and slowly lower the leg. Repeat 10-20 times 2-3 times daily. Perform this exercise against resistance later as your knee gets better.  Quad and Hamstring Sets -  Tighten up the muscle on the front of the thigh (Quad) and hold for 5-10 sec. Repeat this 10-20 times hourly. Hamstring sets are done by pushing the foot backward against an object and holding for 5-10 sec. Repeat as with quad sets.  Leg Slides: Lying on your back, slowly slide your foot toward your buttocks, bending your knee up off the floor (only go as far as is comfortable). Then slowly slide your foot back down until your leg is flat on the floor again. Angel Wings: Lying on your back spread your legs to the side as far apart as you can without causing discomfort.  A rehabilitation program following serious knee injuries can speed recovery and prevent re-injury in the future due to weakened muscles. Contact your doctor or a physical therapist for more information on knee rehabilitation.   IF YOU ARE TRANSFERRED TO A SKILLED REHAB FACILITY If the patient is transferred to a skilled rehab facility following release from the hospital, a list of the current medications will be sent to the facility for the patient to continue.  When discharged from the skilled rehab facility, please have the facility set up the patient's [place] prior to being released. Also, the skilled facility will be responsible for providing the patient with their medications at time of release from the facility to include their pain medication, the muscle relaxants, and their blood thinner medication. If the patient is still at the rehab facility at time of the two week follow up appointment, the skilled  rehab facility will also need to assist the patient in arranging follow up appointment in our office and any transportation needs.  MAKE SURE YOU:  Understand these instructions.  Get help right away if you are not doing well or get worse.    Pick up stool softner and laxative for home use following surgery while on pain medications. Do not submerge incision under water. Please use good hand washing techniques while changing dressing each day. May shower starting three days after surgery. Please use a clean towel to pat the incision dry following showers. Continue to use ice for pain and swelling after surgery. Do not use any lotions or creams on the incision until instructed by your surgeon.   Do not put a pillow under the knee. Place it under the heel.   Complete by:  As directed    Driving restrictions   Complete by:  As directed    No driving for two weeks   TED hose   Complete by:  As directed    Use stockings (TED hose) for three weeks on both leg(s).  You may remove them at night for sleeping.   Weight bearing as tolerated   Complete by:  As directed      Allergies as of 04/28/2018      Reactions   Levofloxacin    Caused C- Diff   Crestor [rosuvastatin] Other (See Comments)   Liver problems   Vytorin [ezetimibe-simvastatin] Other (See Comments)   Liver problems      Medication List    STOP taking these medications   aspirin 81 MG tablet   glucosamine-chondroitin 500-400 MG tablet   meloxicam 15 MG tablet Commonly known as:  MOBIC   MULTI VITAMIN MENS PO   TURMERIC COMPLEX/BLACK PEPPER PO   Vitamin D3 125 MCG (5000 UT) Caps     TAKE these medications   Evolocumab 140 MG/ML Sosy Commonly known as:  REPATHA Inject 140 mg into  the skin every 14 (fourteen) days.   fluticasone 50 MCG/ACT nasal spray Commonly known as:  FLONASE Place 2 sprays into both nostrils daily as needed for allergies or rhinitis.   gabapentin 300 MG capsule Commonly known as:   NEURONTIN Take 1 capsule (300 mg total) by mouth 3 (three) times daily. Take a 300 mg capsule three times a day for two weeks following surgery.Then take a 300 mg capsule two times a day for two weeks. Then take a 300 mg capsule once a day for two weeks. Then discontinue.   methocarbamol 500 MG tablet Commonly known as:  ROBAXIN Take 1 tablet (500 mg total) by mouth every 6 (six) hours as needed for muscle spasms.   metoprolol tartrate 25 MG tablet Commonly known as:  LOPRESSOR TAKE 1 TABLET IN THE MORNING AND 2 TABLETS IN THE EVENING What changed:  See the new instructions.   nitroGLYCERIN 0.4 MG SL tablet Commonly known as:  NITROSTAT Place 1 tablet (0.4 mg total) under the tongue every 5 (five) minutes as needed for chest pain.   omeprazole 20 MG capsule Commonly known as:  PRILOSEC Take 20 mg by mouth daily.   oxyCODONE 5 MG immediate release tablet Commonly known as:  Oxy IR/ROXICODONE Take 1-2 tablets (5-10 mg total) by mouth every 6 (six) hours as needed for severe pain.   rivaroxaban 10 MG Tabs tablet Commonly known as:  XARELTO Take 1 tablet (10 mg total) by mouth daily with breakfast for 20 days. Then resume one 81 mg aspirin once a day.   traMADol 50 MG tablet Commonly known as:  ULTRAM Take 1-2 tablets (50-100 mg total) by mouth every 6 (six) hours as needed for moderate pain.            Discharge Care Instructions  (From admission, onward)         Start     Ordered   04/28/18 0000  Weight bearing as tolerated     04/28/18 0728   04/28/18 0000  Change dressing    Comments:  Change dressing on Wednesday, then change the dressing daily with sterile 4 x 4 inch gauze dressing and apply TED hose.   04/28/18 0728         Follow-up Information    [facility]. Go on 05/01/2018.   Why:  You are scheduled for a physical therapy appointment on 05-01-2018 at 10:30 am at [facility] in [place].  Arrival time is 10:10 am.        Gaynelle Arabian, MD. Go on 05/12/2018.   Specialty:  Orthopedic Surgery Why:  You are scheduled for a post-operative appointment on 05-12-2018 at 1:45 pm Contact information: [address] [place] [address] [phone]           Signed: Theresa Duty, PA-C Orthopedic Surgery 04/29/2018, 1:21 PM

## 2018-05-01 ENCOUNTER — Ambulatory Visit: Payer: Medicare Other | Attending: Orthopedic Surgery | Admitting: Physical Therapy

## 2018-05-01 ENCOUNTER — Encounter: Payer: Self-pay | Admitting: Physical Therapy

## 2018-05-01 DIAGNOSIS — R262 Difficulty in walking, not elsewhere classified: Secondary | ICD-10-CM

## 2018-05-01 DIAGNOSIS — M25561 Pain in right knee: Secondary | ICD-10-CM

## 2018-05-01 DIAGNOSIS — M25661 Stiffness of right knee, not elsewhere classified: Secondary | ICD-10-CM

## 2018-05-01 NOTE — Therapy (Signed)
[facility] [place], [place], [address] Phone: [phone]   Fax:  [phone]  Physical Therapy Evaluation  Patient Details  Name: [name] [name] MRN: [id number] Date of Birth: 1941-01-29 Referring Provider (PT): Gaynelle Arabian, MD   Encounter Date: 05/01/2018  PT End of Session - 05/01/18 1337    Visit Number  1    Number of Visits  12    Date for PT Re-Evaluation  06/19/18    Authorization Type  FOTO, Progress note every 10th visit; Kx modifier after 15th visit    PT Start Time  1033    PT Stop Time  1116    PT Time Calculation (min)  43 min    Activity Tolerance  Patient tolerated treatment well    Behavior During Therapy  [facility] for tasks assessed/performed       Past Medical History:  Diagnosis Date  . CAD (coronary artery disease)   . Colon polyps   . Dyslipidemia   . Herniated disc   . Hiatal hernia   . HTN (hypertension)   . Hyperlipidemia   . Kidney stones     Past Surgical History:  Procedure Laterality Date  . CORONARY ARTERY BYPASS GRAFT  2006   LIMA to LAD, SVG to RCA, SVG to circumflex, SVG to diagonal   . mole removed  2003  . right knee surgery  2008  . TONSILLECTOMY    . TOTAL KNEE ARTHROPLASTY Right 04/27/2018   Procedure: TOTAL KNEE ARTHROPLASTY RIGHT;  Surgeon: Gaynelle Arabian, MD;  Location: [facility];  Service: Orthopedics;  Laterality: Right;  24min    There were no vitals filed for this visit.   Subjective Assessment - 05/01/18 1320    Subjective  Patient arrives to physical therapy with reports of right knee pain, difficulty walking and difficulty with performing ADLs due to a right knee replacement on 04/27/2018. Patient reports requiring assistance from his wife for bathing and dressing. Patient has been compliant with HEP as well as icing and elevating for edema control. Patient reports negotiating stairs sideways with bilateral UE support on the railing. Patient reports pain at worst  is 8/10 and pain at best is 0/10. Patient's goals are to decrease pain, improve movement, improve strength, ambulate without an AD, and return to recreational activities like golf.     Pertinent History  right TKA 04/27/2018; CAD, HTN, herniated lumbar disc, CAD, CABG    Limitations  Standing;Walking;House hold activities    Diagnostic tests  x-ray, MRI    Patient Stated Goals  walk without AD and return to normal.     Currently in Pain?  No/denies   denied pain at rest   Pain Score  0-No pain    Pain Location  Knee    Pain Orientation  Right    Pain Type  Surgical pain    Pain Onset  In the past 7 days    Pain Frequency  Intermittent    Aggravating Factors   standing    Pain Relieving Factors  ice and medication    Effect of Pain on Daily Activities  difficulties with ADLs due to pain         [facility] PT Assessment - 05/01/18 0001      Assessment   Medical Diagnosis  Presence of right artificial knee joint    Referring Provider (PT)  Gaynelle Arabian, MD    Onset Date/Surgical Date  04/27/18    Next MD Visit  05/12/2018  Prior Therapy  no      Precautions   Precaution Comments  no ultrasound      Restrictions   Weight Bearing Restrictions  No      Balance Screen   Has the patient fallen in the past 6 months  No    Has the patient had a decrease in activity level because of a fear of falling?   No    Is the patient reluctant to leave their home because of a fear of falling?   No      Home Environment   Living Environment  Private residence    Living Arrangements  Spouse/significant other    Available Help at Discharge  Family    Type of [place]  Two level    Alternate Level Stairs-Number of Steps  16-18    Alternate Level Stairs-Rails  Right      Prior Function   Level of Independence  Needs assistance with gait;Needs assistance with homemaking;Needs assistance with ADLs      Observation/Other Assessments   Observations  ace bandage donned on right  knee    Focus on Therapeutic Outcomes (FOTO)   68% limited      ROM / Strength   AROM / PROM / Strength  PROM;AROM      AROM   AROM Assessment Site  Knee    Right/Left Knee  Right    Right Knee Extension  10    Right Knee Flexion  84      PROM   PROM Assessment Site  Knee    Right/Left Knee  Right    Right Knee Extension  7    Right Knee Flexion  94      Ambulation/Gait   Assistive device  Rolling walker    Gait Pattern  Step-to pattern;Decreased step length - left;Decreased stance time - right;Decreased stride length;Decreased weight shift to right;Right flexed knee in stance                Objective measurements completed on examination: See above findings.      [facility] Adult PT Treatment/Exercise - 05/01/18 0001      Modalities   Modalities  Vasopneumatic      Vasopneumatic   Number Minutes Vasopneumatic   15 minutes    Vasopnuematic Location   Knee    Vasopneumatic Pressure  Low    Vasopneumatic Temperature   38             PT Education - 05/01/18 1336    Education Details  continue with HEP provided by surgeon    Person(s) Educated  Patient    Methods  Explanation;Demonstration    Comprehension  Verbalized understanding;Returned demonstration       PT Short Term Goals - 05/01/18 1339      PT SHORT TERM GOAL #1   Title  Patient will be independent with HEP provided by hospital.    Time  2    Period  Weeks    Status  New      PT SHORT TERM GOAL #2   Title  Patient will demonstrate 95+ degrees of right knee flexion AROM to improve ability to perform functional tasks.    Time  2    Period  Weeks    Status  New      PT SHORT TERM GOAL #3   Title  Patient will demonstrate 3 degrees or less of right knee extension AROM  to improve gait mechanics.     Time  2    Period  Weeks    Status  New        PT Long Term Goals - 05/01/18 1340      PT LONG TERM GOAL #1   Title  Patient will be independent with advanced HEP    Time  4    Period   Weeks    Status  New      PT LONG TERM GOAL #2   Title  Patient will demonstrate 120+ degrees of right knee flexion AROM to improve ability to perform functional tasks.     Time  4    Period  Weeks    Status  New      PT LONG TERM GOAL #3   Title  Patient will demonstrate 0 degrees of right knee extension AROM to improve gait mechanics.     Time  4    Period  Weeks    Status  New      PT LONG TERM GOAL #4   Title  Patient will negotiate steps with a reciprocating gait pattern with one rail to safely access bedroom.     Time  4    Period  Weeks    Status  New      PT LONG TERM GOAL #5   Title  Patient will ambulate community distances without an AD and right knee pain less than 3/10.    Time  4    Period  Weeks    Status  New             Plan - 05/01/18 1338    Clinical Impression Statement  Patient is a 78 year old male who presents to physical therapy with right knee pain, decreased right knee ROM, and difficulty walking s/p right TKA on 04/27/2018. Patient required min assist at right knee to transition from supine to sit. Patient had ace bandage donned on right knee. Patient's walker was adjusted properly for his height. Patient ambulates with a step to gait pattern with decreased right knee weight bearing and weight shifting and with increased knee flexion during stance. Patient would benefit from skilled physical therapy to address deficits and address patient's goals.     History and Personal Factors relevant to plan of care:  right TKA 04/27/2018; HTN , CAD, CABG, herinated lumbar discs    Clinical Presentation  Stable    Clinical Decision Making  Low    Rehab Potential  Good    PT Frequency  3x / week    PT Duration  4 weeks    PT Treatment/Interventions  ADLs/Self Care Home Management;Electrical Stimulation;Moist Heat;Cryotherapy;Manual techniques;Dry needling;Passive range of motion;Neuromuscular re-education;Functional mobility training;Stair training;Gait  training;Therapeutic activities;Therapeutic exercise;Balance training;Patient/family education;Taping;Vasopneumatic Device    PT Next Visit Plan  Nustep, AROM and PROM to right knee modalities PRN for pain relief.    PT Home Exercise Plan  Cont. HEP provided by hospital    Consulted and Agree with Plan of Care  Patient       Patient will benefit from skilled therapeutic intervention in order to improve the following deficits and impairments:  Pain, Decreased activity tolerance, Decreased endurance, Decreased range of motion, Decreased strength, Difficulty walking  Visit Diagnosis: Acute pain of right knee - Plan: PT plan of care cert/re-cert  Stiffness of right knee, not elsewhere classified - Plan: PT plan of care cert/re-cert  Difficulty in walking, not elsewhere classified - Plan:  PT plan of care cert/re-cert     Problem List Patient Active Problem List   Diagnosis Date Noted  . Osteoarthritis, knee 04/28/2018  . OA (osteoarthritis) of knee 04/27/2018  . Osteoarthritis of knee 05/08/2017  . Tinnitus of both ears 09/10/2016  . Ventral hernia without obstruction or gangrene 03/28/2016  . AAA (abdominal aortic aneurysm) without rupture ([place]) 07/20/2015  . Obstructive sleep apnea 03/17/2014  . Erectile dysfunction due to arterial insufficiency 01/12/2014  . Thrombocytopenia ([place]) 12/30/2013  . Allergic rhinitis 10/01/2012  . BPH (benign prostatic hyperplasia) 10/01/2012  . PVC's (premature ventricular contractions) 09/01/2009  . COLONIC POLYPS 04/14/2009  . Hyperlipidemia LDL goal <70 04/14/2009  . Essential hypertension 04/14/2009  . Coronary atherosclerosis 04/14/2009  . HIATAL HERNIA 04/14/2009  . HERNIATED DISC 04/14/2009   Gabriela Eves, PT, DPT 05/01/2018, 1:44 PM  [facility] [address] [place], [place], [address] Phone: [phone]   Fax:  [phone]  Name: Keeven Matty MRN: [id number] Date of Birth:  Aug 29, 1940

## 2018-05-05 ENCOUNTER — Ambulatory Visit: Payer: Medicare Other | Attending: Orthopedic Surgery | Admitting: Physical Therapy

## 2018-05-05 DIAGNOSIS — R262 Difficulty in walking, not elsewhere classified: Secondary | ICD-10-CM | POA: Diagnosis not present

## 2018-05-05 DIAGNOSIS — M25661 Stiffness of right knee, not elsewhere classified: Secondary | ICD-10-CM

## 2018-05-05 DIAGNOSIS — M25561 Pain in right knee: Secondary | ICD-10-CM | POA: Diagnosis not present

## 2018-05-05 NOTE — Therapy (Signed)
[facility] [place], [place], [address] Phone: [phone]   Fax:  [phone]  Physical Therapy Treatment  Patient Details  Name: [name] [name] MRN: [id number] Date of Birth: [birth year] Referring Provider (PT): Gaynelle Arabian, MD   Encounter Date: 05/05/2018  PT End of Session - 05/05/18 0915    Visit Number  2    Number of Visits  12    Date for PT Re-Evaluation  06/19/18    Authorization Type  FOTO, Progress note every 10th visit; Kx modifier after 15th visit    PT Start Time  0905    PT Stop Time  0952    PT Time Calculation (min)  47 min    Equipment Utilized During Treatment  Other (comment)   FWW   Activity Tolerance  Patient tolerated treatment well    Behavior During Therapy  [facility] for tasks assessed/performed       Past Medical History:  Diagnosis Date  . CAD (coronary artery disease)   . Colon polyps   . Dyslipidemia   . Herniated disc   . Hiatal hernia   . HTN (hypertension)   . Hyperlipidemia   . Kidney stones     Past Surgical History:  Procedure Laterality Date  . CORONARY ARTERY BYPASS GRAFT  2006   LIMA to LAD, SVG to RCA, SVG to circumflex, SVG to diagonal   . mole removed  2003  . right knee surgery  2008  . TONSILLECTOMY    . TOTAL KNEE ARTHROPLASTY Right 04/27/2018   Procedure: TOTAL KNEE ARTHROPLASTY RIGHT;  Surgeon: Gaynelle Arabian, MD;  Location: [facility];  Service: Orthopedics;  Laterality: Right;  23mn    There were no vitals filed for this visit.  Subjective Assessment - 05/05/18 0914    Subjective  Reports staggering his pain medication this morning. One at 4 am and one at 8 am    Pertinent History  right TKA 04/27/2018; CAD, HTN, herniated lumbar disc, CAD, CABG    Limitations  Standing;Walking;House hold activities    Diagnostic tests  x-ray, MRI    Patient Stated Goals  walk without AD and return to normal.     Currently in Pain?  Yes    Pain Score  5     Pain Location   Knee    Pain Orientation  Right    Pain Descriptors / Indicators  Discomfort    Pain Type  Surgical pain    Pain Onset  1 to 4 weeks ago         [facility] 0001      Assessment   Medical Diagnosis  Presence of right artificial knee joint    Referring Provider (PT)  FGaynelle Arabian MD    Onset Date/Surgical Date  04/27/18    Next MD Visit  05/12/2018    Prior Therapy  no      Precautions   Precaution Comments  no ultrasound      Restrictions   Weight Bearing Restrictions  No      ROM / Strength   AROM / PROM / Strength  AROM      AROM   Overall AROM   Deficits    AROM Assessment Site  Knee    Right/Left Knee  Right    Right Knee Flexion  95                   [facility] Adult PT Treatment/Exercise -  05/05/18 0001      Exercises   Exercises  Knee/Hip      Knee/Hip Exercises: Aerobic   Nustep  L4, sesat 11-10 x10 min      Knee/Hip Exercises: Standing   Hip Flexion  AROM;Right;10 reps;Knee bent    Forward Lunges  Right;15 reps;2 seconds    Rocker Board  5 minutes      Knee/Hip Exercises: Supine   Heel Slides  AROM;Right;20 reps      Modalities   Modalities  Vasopneumatic      Vasopneumatic   Number Minutes Vasopneumatic   15 minutes    Vasopnuematic Location   Knee    Vasopneumatic Pressure  Low    Vasopneumatic Temperature   34               PT Short Term Goals - 05/05/18 0921      PT SHORT TERM GOAL #1   Title  Patient will be independent with HEP provided by hospital.    Time  2    Period  Weeks    Status  Partially Met      PT SHORT TERM GOAL #2   Title  Patient will demonstrate 95+ degrees of right knee flexion AROM to improve ability to perform functional tasks.    Time  2    Period  Weeks    Status  Achieved      PT SHORT TERM GOAL #3   Title  Patient will demonstrate 3 degrees or less of right knee extension AROM to improve gait mechanics.     Time  2    Period  Weeks    Status  On-going        PT  Long Term Goals - 05/05/18 7591      PT LONG TERM GOAL #1   Title  Patient will be independent with advanced HEP    Time  4    Period  Weeks    Status  On-going      PT LONG TERM GOAL #2   Title  Patient will demonstrate 120+ degrees of right knee flexion AROM to improve ability to perform functional tasks.     Time  4    Period  Weeks    Status  On-going      PT LONG TERM GOAL #3   Title  Patient will demonstrate 0 degrees of right knee extension AROM to improve gait mechanics.     Time  4    Period  Weeks    Status  On-going      PT LONG TERM GOAL #4   Title  Patient will negotiate steps with a reciprocating gait pattern with one rail to safely access bedroom.     Time  4    Period  Weeks    Status  On-going      PT LONG TERM GOAL #5   Title  Patient will ambulate community distances without an AD and right knee pain less than 3/10.    Time  4    Period  Weeks    Status  On-going            Plan - 05/05/18 6384    Clinical Impression Statement  Patient presented in clinic with reports of mid level R knee pain. Increased swelling noted from R thigh to R ankle with bruising throughout per patient report. TED hose donned to BLE throughout treatment and postsurgical bandage still in place over R knee.  AROM of R knee measured as 95 deg today following initial exercises. Progress noted in STGs today as patient reports trying to do hospital HEP. Patient reported stretch noted in posterior knee as well as medial knee with exercises today. Normal modalities response noted following removal of the modalities.    Rehab Potential  Good    PT Frequency  3x / week    PT Duration  4 weeks    PT Treatment/Interventions  ADLs/Self Care Home Management;Electrical Stimulation;Moist Heat;Cryotherapy;Manual techniques;Dry needling;Passive range of motion;Neuromuscular re-education;Functional mobility training;Stair training;Gait training;Therapeutic activities;Therapeutic exercise;Balance  training;Patient/family education;Taping;Vasopneumatic Device    PT Next Visit Plan  Nustep, AROM and PROM to right knee modalities PRN for pain relief.    PT Home Exercise Plan  Cont. HEP provided by hospital    Consulted and Agree with Plan of Care  Patient       Patient will benefit from skilled therapeutic intervention in order to improve the following deficits and impairments:  Pain, Decreased activity tolerance, Decreased endurance, Decreased range of motion, Decreased strength, Difficulty walking  Visit Diagnosis: Acute pain of right knee  Stiffness of right knee, not elsewhere classified  Difficulty in walking, not elsewhere classified     Problem List Patient Active Problem List   Diagnosis Date Noted  . Osteoarthritis, knee 04/28/2018  . OA (osteoarthritis) of knee 04/27/2018  . Osteoarthritis of knee 05/08/2017  . Tinnitus of both ears 09/10/2016  . Ventral hernia without obstruction or gangrene 03/28/2016  . AAA (abdominal aortic aneurysm) without rupture ([place]) 07/20/2015  . Obstructive sleep apnea 03/17/2014  . Erectile dysfunction due to arterial insufficiency 01/12/2014  . Thrombocytopenia ([place]) 12/30/2013  . Allergic rhinitis 10/01/2012  . BPH (benign prostatic hyperplasia) 10/01/2012  . PVC's (premature ventricular contractions) 09/01/2009  . COLONIC POLYPS 04/14/2009  . Hyperlipidemia LDL goal <70 04/14/2009  . Essential hypertension 04/14/2009  . Coronary atherosclerosis 04/14/2009  . HIATAL HERNIA 04/14/2009  . HERNIATED DISC 04/14/2009    Standley Brooking, PTA 05/05/2018, 10:07 AM  [facility] [address] [place], [place], [address] Phone: [phone]   Fax:  [phone]  Name: [name] [name] MRN: [id number] Date of Birth: [birth year]

## 2018-05-06 ENCOUNTER — Encounter: Payer: Self-pay | Admitting: Physical Therapy

## 2018-05-06 ENCOUNTER — Ambulatory Visit: Payer: Medicare Other | Admitting: Physical Therapy

## 2018-05-06 DIAGNOSIS — M25661 Stiffness of right knee, not elsewhere classified: Secondary | ICD-10-CM | POA: Diagnosis not present

## 2018-05-06 DIAGNOSIS — M25561 Pain in right knee: Secondary | ICD-10-CM | POA: Diagnosis not present

## 2018-05-06 DIAGNOSIS — R262 Difficulty in walking, not elsewhere classified: Secondary | ICD-10-CM | POA: Diagnosis not present

## 2018-05-06 NOTE — Therapy (Signed)
[facility] [place], [place], [address] Phone: [phone]   Fax:  [phone]  Physical Therapy Treatment  Patient Details  Name: [name] [name] MRN: [id number] Date of Birth: [birth year] Referring Provider (PT): Gaynelle Arabian, MD   Encounter Date: 05/06/2018  PT End of Session - 05/06/18 1027    Visit Number  3    Number of Visits  12    Date for PT Re-Evaluation  06/19/18    Authorization Type  FOTO, Progress note every 10th visit; Kx modifier after 15th visit    PT Start Time  0900    PT Stop Time  0953    PT Time Calculation (min)  53 min    Equipment Utilized During Treatment  --   rolling walker   Activity Tolerance  Patient tolerated treatment well    Behavior During Therapy  [facility] for tasks assessed/performed       Past Medical History:  Diagnosis Date  . CAD (coronary artery disease)   . Colon polyps   . Dyslipidemia   . Herniated disc   . Hiatal hernia   . HTN (hypertension)   . Hyperlipidemia   . Kidney stones     Past Surgical History:  Procedure Laterality Date  . CORONARY ARTERY BYPASS GRAFT  2006   LIMA to LAD, SVG to RCA, SVG to circumflex, SVG to diagonal   . mole removed  2003  . right knee surgery  2008  . TONSILLECTOMY    . TOTAL KNEE ARTHROPLASTY Right 04/27/2018   Procedure: TOTAL KNEE ARTHROPLASTY RIGHT;  Surgeon: Gaynelle Arabian, MD;  Location: [facility];  Service: Orthopedics;  Laterality: Right;  89mn    There were no vitals filed for this visit.  Subjective Assessment - 05/06/18 1027    Subjective  Patient reports last session went well.     Pertinent History  right TKA 04/27/2018; CAD, HTN, herniated lumbar disc, CAD, CABG    Limitations  Standing;Walking;House hold activities    Diagnostic tests  x-ray, MRI    Patient Stated Goals  walk without AD and return to normal.     Currently in Pain?  Yes   did not provide pain scale        [facility] PT Assessment - 05/06/18 0001       Assessment   Medical Diagnosis  Presence of right artificial knee joint    Referring Provider (PT)  FGaynelle Arabian MD    Onset Date/Surgical Date  04/27/18    Next MD Visit  05/12/2018    Prior Therapy  no      Precautions   Precaution Comments  no ultrasound      Restrictions   Weight Bearing Restrictions  No      Observation/Other Assessments-Edema    Edema  Circumferential   52 cm r ; 43.5 cm l     Circumferential Edema   Circumferential - Right  52 cm at mid patella    Circumferential - Left   43.5 cm at mid patella                    [facility] 0001      Exercises   Exercises  Knee/Hip      Knee/Hip Exercises: Aerobic   Nustep  L4, sesat 11-10 x10 min      Knee/Hip Exercises: Standing   Hip Abduction  AROM;Both;10 reps;Knee straight    Rocker Board  5  minutes      Knee/Hip Exercises: Supine   Short Arc Quad Sets  AROM;Right;10 reps    Heel Slides  AROM;Right;10 reps      Modalities   Modalities  Vasopneumatic      Vasopneumatic   Number Minutes Vasopneumatic   15 minutes    Vasopnuematic Location   Knee    Vasopneumatic Pressure  Low    Vasopneumatic Temperature   34               PT Short Term Goals - 05/05/18 0921      PT SHORT TERM GOAL #1   Title  Patient will be independent with HEP provided by hospital.    Time  2    Period  Weeks    Status  Partially Met      PT SHORT TERM GOAL #2   Title  Patient will demonstrate 95+ degrees of right knee flexion AROM to improve ability to perform functional tasks.    Time  2    Period  Weeks    Status  Achieved      PT SHORT TERM GOAL #3   Title  Patient will demonstrate 3 degrees or less of right knee extension AROM to improve gait mechanics.     Time  2    Period  Weeks    Status  On-going        PT Long Term Goals - 05/05/18 3009      PT LONG TERM GOAL #1   Title  Patient will be independent with advanced HEP    Time  4    Period   Weeks    Status  On-going      PT LONG TERM GOAL #2   Title  Patient will demonstrate 120+ degrees of right knee flexion AROM to improve ability to perform functional tasks.     Time  4    Period  Weeks    Status  On-going      PT LONG TERM GOAL #3   Title  Patient will demonstrate 0 degrees of right knee extension AROM to improve gait mechanics.     Time  4    Period  Weeks    Status  On-going      PT LONG TERM GOAL #4   Title  Patient will negotiate steps with a reciprocating gait pattern with one rail to safely access bedroom.     Time  4    Period  Weeks    Status  On-going      PT LONG TERM GOAL #5   Title  Patient will ambulate community distances without an AD and right knee pain less than 3/10.    Time  4    Period  Weeks    Status  On-going            Plan - 05/06/18 1028    Clinical Impression Statement  Patient was able to tolerate treatment well despite pain. Patient required verbal and tactile cues to maintain upright posture on rockerboard. Patient noted with improved for after. Patient educated swelling can take up to a year to fully resolve and the importance of icing and elevating for edema control. Patient's edema was assessed, see objective measures. Normal response to modalites upon removal.     Stability/Clinical Decision Making  Stable/Uncomplicated    Clinical Decision Making  Low    Rehab Potential  Good    PT Frequency  3x / week  PT Duration  4 weeks    PT Treatment/Interventions  ADLs/Self Care Home Management;Electrical Stimulation;Moist Heat;Cryotherapy;Manual techniques;Dry needling;Passive range of motion;Neuromuscular re-education;Functional mobility training;Stair training;Gait training;Therapeutic activities;Therapeutic exercise;Balance training;Patient/family education;Taping;Vasopneumatic Device    PT Next Visit Plan  Nustep, AROM and PROM to right knee modalities PRN for pain relief.    Consulted and Agree with Plan of Care  Patient        Patient will benefit from skilled therapeutic intervention in order to improve the following deficits and impairments:  Pain, Decreased activity tolerance, Decreased endurance, Decreased range of motion, Decreased strength, Difficulty walking  Visit Diagnosis: Acute pain of right knee  Stiffness of right knee, not elsewhere classified  Difficulty in walking, not elsewhere classified     Problem List Patient Active Problem List   Diagnosis Date Noted  . Osteoarthritis, knee 04/28/2018  . OA (osteoarthritis) of knee 04/27/2018  . Osteoarthritis of knee 05/08/2017  . Tinnitus of both ears 09/10/2016  . Ventral hernia without obstruction or gangrene 03/28/2016  . AAA (abdominal aortic aneurysm) without rupture ([place]) 07/20/2015  . Obstructive sleep apnea 03/17/2014  . Erectile dysfunction due to arterial insufficiency 01/12/2014  . Thrombocytopenia ([place]) 12/30/2013  . Allergic rhinitis 10/01/2012  . BPH (benign prostatic hyperplasia) 10/01/2012  . PVC's (premature ventricular contractions) 09/01/2009  . COLONIC POLYPS 04/14/2009  . Hyperlipidemia LDL goal <70 04/14/2009  . Essential hypertension 04/14/2009  . Coronary atherosclerosis 04/14/2009  . HIATAL HERNIA 04/14/2009  . HERNIATED DISC 04/14/2009    Gabriela Eves, PT, DPT 05/06/2018, 10:34 AM  [facility] [address] [place], [place], [address] Phone: [phone]   Fax:  [phone]  Name: [name] [name] MRN: [id number] Date of Birth: [birth year]

## 2018-05-08 ENCOUNTER — Ambulatory Visit: Payer: Medicare Other | Admitting: Physical Therapy

## 2018-05-08 DIAGNOSIS — M25561 Pain in right knee: Secondary | ICD-10-CM

## 2018-05-08 DIAGNOSIS — R262 Difficulty in walking, not elsewhere classified: Secondary | ICD-10-CM

## 2018-05-08 DIAGNOSIS — M25661 Stiffness of right knee, not elsewhere classified: Secondary | ICD-10-CM | POA: Diagnosis not present

## 2018-05-08 NOTE — Therapy (Signed)
[facility] [place], [place], [address] Phone: [phone]   Fax:  [phone]  Physical Therapy Treatment  Patient Details  Name: [name] [name] MRN: [id number] Date of Birth: 01-25-1941 Referring Provider (PT): Gaynelle Arabian, MD   Encounter Date: 05/08/2018  PT End of Session - 05/08/18 1039    Visit Number  4    Number of Visits  12    Date for PT Re-Evaluation  06/19/18    Authorization Type  FOTO, Progress note every 10th visit; Kx modifier after 15th visit    PT Start Time  1038    PT Stop Time  1132    PT Time Calculation (min)  54 min    Equipment Utilized During Treatment  Other (comment)   FWW   Activity Tolerance  Patient tolerated treatment well    Behavior During Therapy  [facility] for tasks assessed/performed       Past Medical History:  Diagnosis Date  . CAD (coronary artery disease)   . Colon polyps   . Dyslipidemia   . Herniated disc   . Hiatal hernia   . HTN (hypertension)   . Hyperlipidemia   . Kidney stones     Past Surgical History:  Procedure Laterality Date  . CORONARY ARTERY BYPASS GRAFT  2006   LIMA to LAD, SVG to RCA, SVG to circumflex, SVG to diagonal   . mole removed  2003  . right knee surgery  2008  . TONSILLECTOMY    . TOTAL KNEE ARTHROPLASTY Right 04/27/2018   Procedure: TOTAL KNEE ARTHROPLASTY RIGHT;  Surgeon: Gaynelle Arabian, MD;  Location: [facility];  Service: Orthopedics;  Laterality: Right;  44mn    There were no vitals filed for this visit.  Subjective Assessment - 05/08/18 1038    Subjective  Reports that his RLE is still very swollen from thigh to R foot.    Pertinent History  right TKA 04/27/2018; CAD, HTN, herniated lumbar disc, CAD, CABG    Limitations  Standing;Walking;House hold activities    Diagnostic tests  x-ray, MRI    Patient Stated Goals  walk without AD and return to normal.     Currently in Pain?  Yes    Pain Score  4     Pain Location  Knee    Pain  Orientation  Right    Pain Descriptors / Indicators  Tightness;Discomfort    Pain Type  Surgical pain    Pain Onset  1 to 4 weeks ago         [facility] - 05/08[facility]18 0001      Assessment   Medical Diagnosis  Presence of right artificial knee joint    Referring Provider (PT)  FGaynelle Arabian MD    Onset Date/Surgical Date  04/27/18    Next MD Visit  05/12/2018    Prior Therapy  no      Precautions   Precaution Comments  no ultrasound      Restrictions   Weight Bearing Restrictions  No                   [facility] Adult PT Treatment/Exercise - 05/08/18 0001      Knee/Hip Exercises: Aerobic   Nustep  L4, seat 12-10 x14 min      Knee/Hip Exercises: Standing   Hip Flexion  AROM;Right;15 reps;Knee bent    Forward Lunges  Right;15 reps;2 seconds    Rocker Board  3 minutes  Knee/Hip Exercises: Supine   Short Arc Quad Sets  AROM;Right;15 reps    Heel Slides  AROM;Right;15 reps      Modalities   Modalities  Vasopneumatic      Vasopneumatic   Number Minutes Vasopneumatic   15 minutes    Vasopnuematic Location   Knee    Vasopneumatic Pressure  Medium    Vasopneumatic Temperature   34               PT Short Term Goals - 05/05/18 0921      PT SHORT TERM GOAL #1   Title  Patient will be independent with HEP provided by hospital.    Time  2    Period  Weeks    Status  Partially Met      PT SHORT TERM GOAL #2   Title  Patient will demonstrate 95+ degrees of right knee flexion AROM to improve ability to perform functional tasks.    Time  2    Period  Weeks    Status  Achieved      PT SHORT TERM GOAL #3   Title  Patient will demonstrate 3 degrees or less of right knee extension AROM to improve gait mechanics.     Time  2    Period  Weeks    Status  On-going        PT Long Term Goals - 05/05/18 7371      PT LONG TERM GOAL #1   Title  Patient will be independent with advanced HEP    Time  4    Period  Weeks    Status  On-going      PT  LONG TERM GOAL #2   Title  Patient will demonstrate 120+ degrees of right knee flexion AROM to improve ability to perform functional tasks.     Time  4    Period  Weeks    Status  On-going      PT LONG TERM GOAL #3   Title  Patient will demonstrate 0 degrees of right knee extension AROM to improve gait mechanics.     Time  4    Period  Weeks    Status  On-going      PT LONG TERM GOAL #4   Title  Patient will negotiate steps with a reciprocating gait pattern with one rail to safely access bedroom.     Time  4    Period  Weeks    Status  On-going      PT LONG TERM GOAL #5   Title  Patient will ambulate community distances without an AD and right knee pain less than 3/10.    Time  4    Period  Weeks    Status  On-going            Plan - 05/08/18 1121    Clinical Impression Statement  Patient continues to present with increased edema from R thigh all to R forefoot region along with bruising. B TED hose donned during treatment and patient compliant with elevation and icing per paperwork but encouraged to elevate more due to such increased edema. Patient able to complete therex well but limited with stiffness of R knee. Normal modalities response noted following removal of the modalities.    Stability/Clinical Decision Making  Stable/Uncomplicated    Rehab Potential  Good    PT Frequency  3x / week    PT Duration  4 weeks    PT  Treatment/Interventions  ADLs/Self Care Home Management;Electrical Stimulation;Moist Heat;Cryotherapy;Manual techniques;Dry needling;Passive range of motion;Neuromuscular re-education;Functional mobility training;Stair training;Gait training;Therapeutic activities;Therapeutic exercise;Balance training;Patient/family education;Taping;Vasopneumatic Device    PT Next Visit Plan  Nustep, AROM and PROM to right knee modalities PRN for pain relief. MD note required 05/11/2018 and assess ROM and edema.    PT Home Exercise Plan  Cont. HEP provided by hospital     Consulted and Agree with Plan of Care  Patient       Patient will benefit from skilled therapeutic intervention in order to improve the following deficits and impairments:  Pain, Decreased activity tolerance, Decreased endurance, Decreased range of motion, Decreased strength, Difficulty walking  Visit Diagnosis: Acute pain of right knee  Stiffness of right knee, not elsewhere classified  Difficulty in walking, not elsewhere classified     Problem List Patient Active Problem List   Diagnosis Date Noted  . Osteoarthritis, knee 04/28/2018  . OA (osteoarthritis) of knee 04/27/2018  . Osteoarthritis of knee 05/08/2017  . Tinnitus of both ears 09/10/2016  . Ventral hernia without obstruction or gangrene 03/28/2016  . AAA (abdominal aortic aneurysm) without rupture ([place]) 07/20/2015  . Obstructive sleep apnea 03/17/2014  . Erectile dysfunction due to arterial insufficiency 01/12/2014  . Thrombocytopenia ([place]) 12/30/2013  . Allergic rhinitis 10/01/2012  . BPH (benign prostatic hyperplasia) 10/01/2012  . PVC's (premature ventricular contractions) 09/01/2009  . COLONIC POLYPS 04/14/2009  . Hyperlipidemia LDL goal <70 04/14/2009  . Essential hypertension 04/14/2009  . Coronary atherosclerosis 04/14/2009  . HIATAL HERNIA 04/14/2009  . HERNIATED DISC 04/14/2009    Standley Brooking, PTA 05/08/2018, 12:26 PM  [facility] [address] [place], [place], [address] Phone: [phone]   Fax:  [phone]  Name: [name] [name] MRN: [id number] Date of Birth: 08/09/1940

## 2018-05-11 ENCOUNTER — Ambulatory Visit: Payer: Medicare Other | Admitting: Physical Therapy

## 2018-05-11 ENCOUNTER — Encounter: Payer: Self-pay | Admitting: Physical Therapy

## 2018-05-11 DIAGNOSIS — M25561 Pain in right knee: Secondary | ICD-10-CM

## 2018-05-11 DIAGNOSIS — M25661 Stiffness of right knee, not elsewhere classified: Secondary | ICD-10-CM | POA: Diagnosis not present

## 2018-05-11 DIAGNOSIS — R262 Difficulty in walking, not elsewhere classified: Secondary | ICD-10-CM

## 2018-05-11 NOTE — Therapy (Signed)
[facility] [place], [place], [address] Phone: [phone]   Fax:  [phone]  Physical Therapy Treatment  Patient Details  Name: [name] [name] MRN: [id number] Date of Birth: 1940-08-28 Referring Provider (PT): Gaynelle Arabian, MD   Encounter Date: 05/11/2018  PT End of Session - 05/11/18 1043    Visit Number  5    Number of Visits  12    Date for PT Re-Evaluation  06/19/18    Authorization Type  FOTO, Progress note every 10th visit; Kx modifier after 15th visit    PT Start Time  1035    PT Stop Time  1128    PT Time Calculation (min)  53 min    Equipment Utilized During Treatment  Other (comment)    Activity Tolerance  Patient tolerated treatment well    Behavior During Therapy  [facility] for tasks assessed/performed       Past Medical History:  Diagnosis Date  . CAD (coronary artery disease)   . Colon polyps   . Dyslipidemia   . Herniated disc   . Hiatal hernia   . HTN (hypertension)   . Hyperlipidemia   . Kidney stones     Past Surgical History:  Procedure Laterality Date  . CORONARY ARTERY BYPASS GRAFT  2006   LIMA to LAD, SVG to RCA, SVG to circumflex, SVG to diagonal   . mole removed  2003  . right knee surgery  2008  . TONSILLECTOMY    . TOTAL KNEE ARTHROPLASTY Right 04/27/2018   Procedure: TOTAL KNEE ARTHROPLASTY RIGHT;  Surgeon: Gaynelle Arabian, MD;  Location: [facility];  Service: Orthopedics;  Laterality: Right;  20min    There were no vitals filed for this visit.  Subjective Assessment - 05/11/18 1042    Subjective  Reports swelling has decreased and he wanted to try ambulating with a cane but did not for safety.     Pertinent History  right TKA 04/27/2018; CAD, HTN, herniated lumbar disc, CAD, CABG    Limitations  Standing;Walking;House hold activities    Diagnostic tests  x-ray, MRI    Patient Stated Goals  walk without AD and return to normal.     Currently in Pain?  Yes         [facility] PT  Assessment - 05/11/18 0001      Assessment   Medical Diagnosis  Presence of right artificial knee joint    Referring Provider (PT)  Gaynelle Arabian, MD    Onset Date/Surgical Date  04/27/18    Next MD Visit  05/12/2018    Prior Therapy  no      Precautions   Precaution Comments  no ultrasound      Restrictions   Weight Bearing Restrictions  No      Observation/Other Assessments   Focus on Therapeutic Outcomes (FOTO)   53% limited      AROM   Right Knee Extension  7    Right Knee Flexion  100      PROM   Right Knee Extension  5    Right Knee Flexion  106                   [facility] Adult PT Treatment/Exercise - 05/11/18 0001      Knee/Hip Exercises: Aerobic   Nustep  L4, seat 10-8 x15 min      Knee/Hip Exercises: Supine   Short Arc Quad Sets  AROM;Right;15 reps    Straight Leg Raises  AROM;Strengthening;Right;10 reps      Modalities   Modalities  Vasopneumatic      Vasopneumatic   Number Minutes Vasopneumatic   15 minutes    Vasopnuematic Location   Knee    Vasopneumatic Pressure  Medium    Vasopneumatic Temperature   34      Manual Therapy   Manual Therapy  Passive ROM    Passive ROM  ROM into flexion and extension to improve range               PT Short Term Goals - 05/11/18 1300      PT SHORT TERM GOAL #1   Title  Patient will be independent with HEP provided by hospital.    Time  2    Period  Weeks    Status  Achieved      PT SHORT TERM GOAL #2   Title  Patient will demonstrate 95+ degrees of right knee flexion AROM to improve ability to perform functional tasks.    Time  2    Period  Weeks    Status  Achieved      PT SHORT TERM GOAL #3   Title  Patient will demonstrate 3 degrees or less of right knee extension AROM to improve gait mechanics.     Time  2    Period  Weeks    Status  On-going        PT Long Term Goals - 05/11/18 1301      PT LONG TERM GOAL #1   Title  Patient will be independent with advanced HEP    Time  4     Period  Weeks    Status  On-going      PT LONG TERM GOAL #2   Title  Patient will demonstrate 120+ degrees of right knee flexion AROM to improve ability to perform functional tasks.     Time  4    Period  Weeks    Status  On-going      PT LONG TERM GOAL #3   Title  Patient will demonstrate 0 degrees of right knee extension AROM to improve gait mechanics.     Time  4    Period  Weeks    Status  On-going      PT LONG TERM GOAL #4   Title  Patient will negotiate steps with a reciprocating gait pattern with one rail to safely access bedroom.     Time  4    Period  Weeks    Status  On-going      PT LONG TERM GOAL #5   Title  Patient will ambulate community distances without an AD and right knee pain less than 3/10.    Time  4    Period  Weeks    Status  On-going            Plan - 05/11/18 1304    Clinical Impression Statement  Patient tolerate treatment well and has progressed with his PROM and AROM of his right knee. Patient noted with good form with all exercises and would benefit from gait training with a cane next visit. Patient instructed to perform heel prop for extension and was instructed to avoid hip external rotation compensation. Patient reported understanding and agreement. Normal response to modalities upon removal.     Stability/Clinical Decision Making  Stable/Uncomplicated    Clinical Decision Making  Low    Rehab Potential  Good    PT Frequency  3x / week    PT Duration  4 weeks    PT Treatment/Interventions  ADLs/Self Care Home Management;Electrical Stimulation;Moist Heat;Cryotherapy;Manual techniques;Dry needling;Passive range of motion;Neuromuscular re-education;Functional mobility training;Stair training;Gait training;Therapeutic activities;Therapeutic exercise;Balance training;Patient/family education;Taping;Vasopneumatic Device    PT Next Visit Plan  Nustep, AROM and PROM to right knee modalities PRN for pain relief.     PT Home Exercise Plan  heel prop  for extension    Consulted and Agree with Plan of Care  Patient       Patient will benefit from skilled therapeutic intervention in order to improve the following deficits and impairments:  Pain, Decreased activity tolerance, Decreased endurance, Decreased range of motion, Decreased strength, Difficulty walking  Visit Diagnosis: Acute pain of right knee  Stiffness of right knee, not elsewhere classified  Difficulty in walking, not elsewhere classified     Problem List Patient Active Problem List   Diagnosis Date Noted  . Osteoarthritis, knee 04/28/2018  . OA (osteoarthritis) of knee 04/27/2018  . Osteoarthritis of knee 05/08/2017  . Tinnitus of both ears 09/10/2016  . Ventral hernia without obstruction or gangrene 03/28/2016  . AAA (abdominal aortic aneurysm) without rupture ([place]) 07/20/2015  . Obstructive sleep apnea 03/17/2014  . Erectile dysfunction due to arterial insufficiency 01/12/2014  . Thrombocytopenia ([place]) 12/30/2013  . Allergic rhinitis 10/01/2012  . BPH (benign prostatic hyperplasia) 10/01/2012  . PVC's (premature ventricular contractions) 09/01/2009  . COLONIC POLYPS 04/14/2009  . Hyperlipidemia LDL goal <70 04/14/2009  . Essential hypertension 04/14/2009  . Coronary atherosclerosis 04/14/2009  . HIATAL HERNIA 04/14/2009  . HERNIATED DISC 04/14/2009    Gabriela Eves, PT, DPT 05/11/2018, 1:09 PM  [facility] [place], [place], [address] Phone: [phone]   Fax:  [phone]  Name: [name] [name] MRN: [id number] Date of Birth: 1940-11-30

## 2018-05-13 ENCOUNTER — Encounter: Payer: Self-pay | Admitting: Physical Therapy

## 2018-05-13 ENCOUNTER — Other Ambulatory Visit: Payer: Self-pay

## 2018-05-13 ENCOUNTER — Ambulatory Visit: Payer: Medicare Other | Admitting: Physical Therapy

## 2018-05-13 DIAGNOSIS — M25561 Pain in right knee: Secondary | ICD-10-CM | POA: Diagnosis not present

## 2018-05-13 DIAGNOSIS — M25661 Stiffness of right knee, not elsewhere classified: Secondary | ICD-10-CM | POA: Diagnosis not present

## 2018-05-13 DIAGNOSIS — R262 Difficulty in walking, not elsewhere classified: Secondary | ICD-10-CM | POA: Diagnosis not present

## 2018-05-13 NOTE — Therapy (Signed)
[facility] [place], [place], [address] Phone: [phone]   Fax:  [phone]  Physical Therapy Treatment  Patient Details  Name: [name] [name] MRN: [id number] Date of Birth: 06/16/1940 Referring Provider (PT): Gaynelle Arabian, MD   Encounter Date: 05/13/2018  PT End of Session - 05/13/18 1219    Visit Number  6    Number of Visits  12    Date for PT Re-Evaluation  06/19/18    Authorization Type  FOTO, Progress note every 10th visit; Kx modifier after 15th visit    PT Start Time  1031    PT Stop Time  1139    PT Time Calculation (min)  68 min    Activity Tolerance  Patient tolerated treatment well    Behavior During Therapy  [facility] for tasks assessed/performed       Past Medical History:  Diagnosis Date  . CAD (coronary artery disease)   . Colon polyps   . Dyslipidemia   . Herniated disc   . Hiatal hernia   . HTN (hypertension)   . Hyperlipidemia   . Kidney stones     Past Surgical History:  Procedure Laterality Date  . CORONARY ARTERY BYPASS GRAFT  2006   LIMA to LAD, SVG to RCA, SVG to circumflex, SVG to diagonal   . mole removed  2003  . right knee surgery  2008  . TONSILLECTOMY    . TOTAL KNEE ARTHROPLASTY Right 04/27/2018   Procedure: TOTAL KNEE ARTHROPLASTY RIGHT;  Surgeon: Gaynelle Arabian, MD;  Location: [facility];  Service: Orthopedics;  Laterality: Right;  69min    There were no vitals filed for this visit.  Subjective Assessment - 05/13/18 1132    Subjective  Dr. was pleased with my progress.  Said swelling is normal.    Pertinent History  right TKA 04/27/2018; CAD, HTN, herniated lumbar disc, CAD, CABG    Limitations  Standing;Walking;House hold activities    Diagnostic tests  x-ray, MRI    Patient Stated Goals  walk without AD and return to normal.     Currently in Pain?  Yes    Pain Score  4                        [facility] Adult PT Treatment/Exercise - 05/13/18 0001      Exercises   Exercises  Knee/Hip      Knee/Hip Exercises: Aerobic   Nustep  Level 4 progressing to seat 8 over 15 minutes.      Knee/Hip Exercises: Supine   Quad Sets Limitations  SAQ's (non-resisted) facilitated with VMS to patient's right quads x 15 minutes with a 10 sec extension hold and 10 sec rest.      Electrical Stimulation   Electrical Stimulation Location  Right knee.    Electrical Stimulation Action  IFC    Electrical Stimulation Parameters  1-10 Hz x 20 minutes.    Electrical Stimulation Goals  Edema;Pain      Vasopneumatic   Number Minutes Vasopneumatic   20 minutes    Vasopnuematic Location   --   Right knee.   Vasopneumatic Pressure  Medium               PT Short Term Goals - 05/11/18 1300      PT SHORT TERM GOAL #1   Title  Patient will be independent with HEP provided by hospital.    Time  2  Period  Weeks    Status  Achieved      PT SHORT TERM GOAL #2   Title  Patient will demonstrate 95+ degrees of right knee flexion AROM to improve ability to perform functional tasks.    Time  2    Period  Weeks    Status  Achieved      PT SHORT TERM GOAL #3   Title  Patient will demonstrate 3 degrees or less of right knee extension AROM to improve gait mechanics.     Time  2    Period  Weeks    Status  On-going        PT Long Term Goals - 05/11/18 1301      PT LONG TERM GOAL #1   Title  Patient will be independent with advanced HEP    Time  4    Period  Weeks    Status  On-going      PT LONG TERM GOAL #2   Title  Patient will demonstrate 120+ degrees of right knee flexion AROM to improve ability to perform functional tasks.     Time  4    Period  Weeks    Status  On-going      PT LONG TERM GOAL #3   Title  Patient will demonstrate 0 degrees of right knee extension AROM to improve gait mechanics.     Time  4    Period  Weeks    Status  On-going      PT LONG TERM GOAL #4   Title  Patient will negotiate steps with a reciprocating gait  pattern with one rail to safely access bedroom.     Time  4    Period  Weeks    Status  On-going      PT LONG TERM GOAL #5   Title  Patient will ambulate community distances without an AD and right knee pain less than 3/10.    Time  4    Period  Weeks    Status  On-going            Plan - 05/13/18 1237    Clinical Impression Statement  Patient demonstrated very good right quad activation with VMS.  Furthermore, patient is beginning to wean himself from the walker at home.    PT Treatment/Interventions  ADLs/Self Care Home Management;Electrical Stimulation;Moist Heat;Cryotherapy;Manual techniques;Dry needling;Passive range of motion;Neuromuscular re-education;Functional mobility training;Stair training;Gait training;Therapeutic activities;Therapeutic exercise;Balance training;Patient/family education;Taping;Vasopneumatic Device    PT Home Exercise Plan  heel prop for extension    Consulted and Agree with Plan of Care  Patient       Patient will benefit from skilled therapeutic intervention in order to improve the following deficits and impairments:  Pain, Decreased activity tolerance, Decreased endurance, Decreased range of motion, Decreased strength, Difficulty walking  Visit Diagnosis: Acute pain of right knee  Difficulty in walking, not elsewhere classified  Stiffness of right knee, not elsewhere classified     Problem List Patient Active Problem List   Diagnosis Date Noted  . Osteoarthritis, knee 04/28/2018  . OA (osteoarthritis) of knee 04/27/2018  . Osteoarthritis of knee 05/08/2017  . Tinnitus of both ears 09/10/2016  . Ventral hernia without obstruction or gangrene 03/28/2016  . AAA (abdominal aortic aneurysm) without rupture ([place]) 07/20/2015  . Obstructive sleep apnea 03/17/2014  . Erectile dysfunction due to arterial insufficiency 01/12/2014  . Thrombocytopenia ([place]) 12/30/2013  . Allergic rhinitis 10/01/2012  . BPH (benign prostatic hyperplasia) 10/01/2012   .  PVC's (premature ventricular contractions) 09/01/2009  . COLONIC POLYPS 04/14/2009  . Hyperlipidemia LDL goal <70 04/14/2009  . Essential hypertension 04/14/2009  . Coronary atherosclerosis 04/14/2009  . HIATAL HERNIA 04/14/2009  . HERNIATED DISC 04/14/2009    Zymere Patlan, [place] MPT 05/13/2018, 12:53 PM  [facility] [place], [place], [address] Phone: [phone]   Fax:  [phone]  Name: [name] [name] MRN: [id number] Date of Birth: [birth year]

## 2018-05-15 ENCOUNTER — Other Ambulatory Visit: Payer: Self-pay

## 2018-05-15 ENCOUNTER — Encounter: Payer: Self-pay | Admitting: Physical Therapy

## 2018-05-15 ENCOUNTER — Ambulatory Visit: Payer: Medicare Other | Admitting: Physical Therapy

## 2018-05-15 DIAGNOSIS — R262 Difficulty in walking, not elsewhere classified: Secondary | ICD-10-CM | POA: Diagnosis not present

## 2018-05-15 DIAGNOSIS — M25661 Stiffness of right knee, not elsewhere classified: Secondary | ICD-10-CM

## 2018-05-15 DIAGNOSIS — M25561 Pain in right knee: Secondary | ICD-10-CM

## 2018-05-15 NOTE — Therapy (Signed)
[facility] [place], [place], [address] Phone: [phone]   Fax:  [phone]  Physical Therapy Treatment  Patient Details  Name: [name] [name] MRN: [id number] Date of Birth: August 21, 1940 Referring Provider (PT): Gaynelle Arabian, MD   Encounter Date: 05/15/2018  PT End of Session - 05/15/18 1228    Visit Number  7    Number of Visits  12    Date for PT Re-Evaluation  06/19/18    Authorization Type  FOTO, Progress note every 10th visit; Kx modifier after 15th visit    PT Start Time  1030    PT Stop Time  1133    PT Time Calculation (min)  63 min    Activity Tolerance  Patient tolerated treatment well    Behavior During Therapy  [facility] for tasks assessed/performed       Past Medical History:  Diagnosis Date  . CAD (coronary artery disease)   . Colon polyps   . Dyslipidemia   . Herniated disc   . Hiatal hernia   . HTN (hypertension)   . Hyperlipidemia   . Kidney stones     Past Surgical History:  Procedure Laterality Date  . CORONARY ARTERY BYPASS GRAFT  2006   LIMA to LAD, SVG to RCA, SVG to circumflex, SVG to diagonal   . mole removed  2003  . right knee surgery  2008  . TONSILLECTOMY    . TOTAL KNEE ARTHROPLASTY Right 04/27/2018   Procedure: TOTAL KNEE ARTHROPLASTY RIGHT;  Surgeon: Gaynelle Arabian, MD;  Location: [facility];  Service: Orthopedics;  Laterality: Right;  44min    There were no vitals filed for this visit.  Subjective Assessment - 05/15/18 1050    Subjective  Patient reported feeling sore after last session and tightness in lateral aspect of the thigh.     Pertinent History  right TKA 04/27/2018; CAD, HTN, herniated lumbar disc, CAD, CABG    Limitations  Standing;Walking;House hold activities    Diagnostic tests  x-ray, MRI    Patient Stated Goals  walk without AD and return to normal.     Currently in Pain?  Yes    Pain Score  5     Pain Location  Knee    Pain Orientation  Right    Pain  Descriptors / Indicators  Tightness;Discomfort    Pain Type  Surgical pain    Pain Onset  1 to 4 weeks ago    Pain Frequency  Intermittent         [facility] PT Assessment - 05/15/18 0001      Assessment   Medical Diagnosis  Presence of right artificial knee joint    Referring Provider (PT)  Gaynelle Arabian, MD    Onset Date/Surgical Date  04/27/18    Next MD Visit  05/12/2018    Prior Therapy  no      Precautions   Precaution Comments  no ultrasound      Restrictions   Weight Bearing Restrictions  No                   [facility] Adult PT Treatment/Exercise - 05/15/18 0001      Exercises   Exercises  Knee/Hip      Knee/Hip Exercises: Aerobic   Stationary Bike  AAROM seat 9 x7 minutes    Nustep  Level 5 x10 mins progressing to seat 10-8      Knee/Hip Exercises: Standing   Hip Flexion  AROM;Right;2  sets;15 reps;Knee bent    Rocker Board  3 minutes      Knee/Hip Exercises: Seated   Hamstring Curl  Strengthening;Right;2 sets;10 reps    Hamstring Limitations  red theraband      Electrical Stimulation   Electrical Stimulation Location  Right knee.    Electrical Stimulation Action  IFC    Electrical Stimulation Parameters  1-10 Hz x15 mins    Electrical Stimulation Goals  Pain;Edema      Vasopneumatic   Number Minutes Vasopneumatic   15 minutes    Vasopnuematic Location   Knee    Vasopneumatic Pressure  Low    Vasopneumatic Temperature   35               PT Short Term Goals - 05/11/18 1300      PT SHORT TERM GOAL #1   Title  Patient will be independent with HEP provided by hospital.    Time  2    Period  Weeks    Status  Achieved      PT SHORT TERM GOAL #2   Title  Patient will demonstrate 95+ degrees of right knee flexion AROM to improve ability to perform functional tasks.    Time  2    Period  Weeks    Status  Achieved      PT SHORT TERM GOAL #3   Title  Patient will demonstrate 3 degrees or less of right knee extension AROM to improve gait  mechanics.     Time  2    Period  Weeks    Status  On-going        PT Long Term Goals - 05/11/18 1301      PT LONG TERM GOAL #1   Title  Patient will be independent with advanced HEP    Time  4    Period  Weeks    Status  On-going      PT LONG TERM GOAL #2   Title  Patient will demonstrate 120+ degrees of right knee flexion AROM to improve ability to perform functional tasks.     Time  4    Period  Weeks    Status  On-going      PT LONG TERM GOAL #3   Title  Patient will demonstrate 0 degrees of right knee extension AROM to improve gait mechanics.     Time  4    Period  Weeks    Status  On-going      PT LONG TERM GOAL #4   Title  Patient will negotiate steps with a reciprocating gait pattern with one rail to safely access bedroom.     Time  4    Period  Weeks    Status  On-going      PT LONG TERM GOAL #5   Title  Patient will ambulate community distances without an AD and right knee pain less than 3/10.    Time  4    Period  Weeks    Status  On-going            Plan - 05/15/18 1226    Clinical Impression Statement  Patient was able to tolerate treatment well with minimal reports of increase pain. Patient required intermittent cuing for form and technique and later was able to peform TEs. Patient and PT discussed the components of his knee replacement. Patient reported understanding. Normal response to modalities upon removal.    Stability/Clinical Decision Making  Stable/Uncomplicated  Clinical Decision Making  Low    Rehab Potential  Good    PT Frequency  3x / week    PT Duration  4 weeks    PT Treatment/Interventions  ADLs/Self Care Home Management;Electrical Stimulation;Moist Heat;Cryotherapy;Manual techniques;Dry needling;Passive range of motion;Neuromuscular re-education;Functional mobility training;Stair training;Gait training;Therapeutic activities;Therapeutic exercise;Balance training;Patient/family education;Taping;Vasopneumatic Device    PT Next  Visit Plan  Nustep, AROM and PROM to right knee modalities PRN for pain relief.     Consulted and Agree with Plan of Care  Patient       Patient will benefit from skilled therapeutic intervention in order to improve the following deficits and impairments:  Pain, Decreased activity tolerance, Decreased endurance, Decreased range of motion, Decreased strength, Difficulty walking  Visit Diagnosis: Acute pain of right knee  Difficulty in walking, not elsewhere classified  Stiffness of right knee, not elsewhere classified     Problem List Patient Active Problem List   Diagnosis Date Noted  . Osteoarthritis, knee 04/28/2018  . OA (osteoarthritis) of knee 04/27/2018  . Osteoarthritis of knee 05/08/2017  . Tinnitus of both ears 09/10/2016  . Ventral hernia without obstruction or gangrene 03/28/2016  . AAA (abdominal aortic aneurysm) without rupture ([place]) 07/20/2015  . Obstructive sleep apnea 03/17/2014  . Erectile dysfunction due to arterial insufficiency 01/12/2014  . Thrombocytopenia ([place]) 12/30/2013  . Allergic rhinitis 10/01/2012  . BPH (benign prostatic hyperplasia) 10/01/2012  . PVC's (premature ventricular contractions) 09/01/2009  . COLONIC POLYPS 04/14/2009  . Hyperlipidemia LDL goal <70 04/14/2009  . Essential hypertension 04/14/2009  . Coronary atherosclerosis 04/14/2009  . HIATAL HERNIA 04/14/2009  . HERNIATED DISC 04/14/2009   Gabriela Eves, PT, DPT 05/15/2018, 12:30 PM  [facility] [place], [place], [address] Phone: [phone]   Fax:  [phone]  Name: [name] [name] MRN: [id number] Date of Birth: 1940-12-02

## 2018-05-18 ENCOUNTER — Ambulatory Visit: Payer: Medicare Other | Admitting: Physical Therapy

## 2018-05-18 ENCOUNTER — Other Ambulatory Visit: Payer: Self-pay | Admitting: *Deleted

## 2018-05-18 ENCOUNTER — Other Ambulatory Visit: Payer: Self-pay

## 2018-05-18 ENCOUNTER — Telehealth: Payer: Self-pay | Admitting: Family Medicine

## 2018-05-18 DIAGNOSIS — M25561 Pain in right knee: Secondary | ICD-10-CM | POA: Diagnosis not present

## 2018-05-18 DIAGNOSIS — R262 Difficulty in walking, not elsewhere classified: Secondary | ICD-10-CM

## 2018-05-18 DIAGNOSIS — M25661 Stiffness of right knee, not elsewhere classified: Secondary | ICD-10-CM

## 2018-05-18 DIAGNOSIS — D696 Thrombocytopenia, unspecified: Secondary | ICD-10-CM

## 2018-05-18 DIAGNOSIS — E785 Hyperlipidemia, unspecified: Secondary | ICD-10-CM

## 2018-05-18 DIAGNOSIS — I10 Essential (primary) hypertension: Secondary | ICD-10-CM

## 2018-05-18 DIAGNOSIS — Z8052 Family history of malignant neoplasm of bladder: Secondary | ICD-10-CM

## 2018-05-18 DIAGNOSIS — E559 Vitamin D deficiency, unspecified: Secondary | ICD-10-CM

## 2018-05-18 NOTE — Telephone Encounter (Signed)
Pt called - aware appt still on = lab orders placed

## 2018-05-18 NOTE — Therapy (Signed)
[facility] [place], [place], [address] Phone: [phone]   Fax:  [phone]  Physical Therapy Treatment  Patient Details  Name: [name] [name] MRN: [id number] Date of Birth: 20-Oct-1940 Referring Provider (PT): Gaynelle Arabian, MD   Encounter Date: 05/18/2018  PT End of Session - 05/18/18 1228    Visit Number  8    Number of Visits  12    Date for PT Re-Evaluation  06/19/18    Authorization Type  FOTO, Progress note every 10th visit; Kx modifier after 15th visit    PT Start Time  1035    PT Stop Time  1138    PT Time Calculation (min)  63 min    Activity Tolerance  Patient tolerated treatment well    Behavior During Therapy  [facility] for tasks assessed/performed       Past Medical History:  Diagnosis Date  . CAD (coronary artery disease)   . Colon polyps   . Dyslipidemia   . Herniated disc   . Hiatal hernia   . HTN (hypertension)   . Hyperlipidemia   . Kidney stones     Past Surgical History:  Procedure Laterality Date  . CORONARY ARTERY BYPASS GRAFT  2006   LIMA to LAD, SVG to RCA, SVG to circumflex, SVG to diagonal   . mole removed  2003  . right knee surgery  2008  . TONSILLECTOMY    . TOTAL KNEE ARTHROPLASTY Right 04/27/2018   Procedure: TOTAL KNEE ARTHROPLASTY RIGHT;  Surgeon: Gaynelle Arabian, MD;  Location: [facility];  Service: Orthopedics;  Laterality: Right;  95min    There were no vitals filed for this visit.  Subjective Assessment - 05/18/18 1229    Subjective  Off walker and on cane.    Currently in Pain?  Yes    Pain Score  5     Pain Location  Knee    Pain Orientation  Right    Pain Descriptors / Indicators  Tightness;Discomfort    Pain Type  Surgical pain    Pain Onset  1 to 4 weeks ago                       [facility] Adult PT Treatment/Exercise - 05/18/18 0001      Exercises   Exercises  Knee/Hip      Knee/Hip Exercises: Aerobic   Recumbent Bike  15 minutes.      Knee/Hip Exercises: Supine   Short Arc Quad Sets Limitations  SAQ's on left with 2# x 15 minutes facilitated with VMS to right quads (10 sec extension holds f/b 10 sec rest).      Modalities   Modalities  Health visitor Stimulation Location  Right knee.    Electrical Stimulation Action  IFC    Electrical Stimulation Parameters  1-10 Hz x 20 minutes.    Electrical Stimulation Goals  Edema;Pain      Vasopneumatic   Number Minutes Vasopneumatic   20 minutes    Vasopnuematic Location   --   Right knee.   Vasopneumatic Pressure  Medium               PT Short Term Goals - 05/11/18 1300      PT SHORT TERM GOAL #1   Title  Patient will be independent with HEP provided by hospital.    Time  2    Period  Weeks  Status  Achieved      PT SHORT TERM GOAL #2   Title  Patient will demonstrate 95+ degrees of right knee flexion AROM to improve ability to perform functional tasks.    Time  2    Period  Weeks    Status  Achieved      PT SHORT TERM GOAL #3   Title  Patient will demonstrate 3 degrees or less of right knee extension AROM to improve gait mechanics.     Time  2    Period  Weeks    Status  On-going        PT Long Term Goals - 05/11/18 1301      PT LONG TERM GOAL #1   Title  Patient will be independent with advanced HEP    Time  4    Period  Weeks    Status  On-going      PT LONG TERM GOAL #2   Title  Patient will demonstrate 120+ degrees of right knee flexion AROM to improve ability to perform functional tasks.     Time  4    Period  Weeks    Status  On-going      PT LONG TERM GOAL #3   Title  Patient will demonstrate 0 degrees of right knee extension AROM to improve gait mechanics.     Time  4    Period  Weeks    Status  On-going      PT LONG TERM GOAL #4   Title  Patient will negotiate steps with a reciprocating gait pattern with one rail to safely access bedroom.     Time  4    Period   Weeks    Status  On-going      PT LONG TERM GOAL #5   Title  Patient will ambulate community distances without an AD and right knee pain less than 3/10.    Time  4    Period  Weeks    Status  On-going            Plan - 05/18/18 1232    Clinical Impression Statement  Great job with progression to cane.  On bike making forward revolutions.      PT Next Visit Plan  Weight machines (quads, hams and leg press).       Patient will benefit from skilled therapeutic intervention in order to improve the following deficits and impairments:     Visit Diagnosis: Acute pain of right knee  Difficulty in walking, not elsewhere classified  Stiffness of right knee, not elsewhere classified     Problem List Patient Active Problem List   Diagnosis Date Noted  . Osteoarthritis, knee 04/28/2018  . OA (osteoarthritis) of knee 04/27/2018  . Osteoarthritis of knee 05/08/2017  . Tinnitus of both ears 09/10/2016  . Ventral hernia without obstruction or gangrene 03/28/2016  . AAA (abdominal aortic aneurysm) without rupture ([place]) 07/20/2015  . Obstructive sleep apnea 03/17/2014  . Erectile dysfunction due to arterial insufficiency 01/12/2014  . Thrombocytopenia ([place]) 12/30/2013  . Allergic rhinitis 10/01/2012  . BPH (benign prostatic hyperplasia) 10/01/2012  . PVC's (premature ventricular contractions) 09/01/2009  . COLONIC POLYPS 04/14/2009  . Hyperlipidemia LDL goal <70 04/14/2009  . Essential hypertension 04/14/2009  . Coronary atherosclerosis 04/14/2009  . HIATAL HERNIA 04/14/2009  . HERNIATED DISC 04/14/2009    Adal Sereno, [place] MPT 05/18/2018, 12:33 PM  [facility] [place], [place], [address] Phone:  [phone]   Fax:  [phone]  Name: [name] [name] MRN: [id number] Date of Birth: 1940-07-26

## 2018-05-20 ENCOUNTER — Ambulatory Visit: Payer: Medicare Other | Admitting: Family Medicine

## 2018-05-20 ENCOUNTER — Ambulatory Visit: Payer: Medicare Other | Admitting: Physical Therapy

## 2018-05-20 ENCOUNTER — Other Ambulatory Visit: Payer: Self-pay

## 2018-05-20 DIAGNOSIS — R262 Difficulty in walking, not elsewhere classified: Secondary | ICD-10-CM

## 2018-05-20 DIAGNOSIS — M25561 Pain in right knee: Secondary | ICD-10-CM | POA: Diagnosis not present

## 2018-05-20 DIAGNOSIS — M25661 Stiffness of right knee, not elsewhere classified: Secondary | ICD-10-CM | POA: Diagnosis not present

## 2018-05-20 NOTE — Therapy (Signed)
[facility] [place], [place], [address] Phone: [phone]   Fax:  [phone]  Physical Therapy Treatment  Patient Details  Name: [name] [name] MRN: [id number] Date of Birth: [birth year] Referring Provider (PT): Gaynelle Arabian, MD   Encounter Date: 05/20/2018  PT End of Session - 05/20/18 1206    Visit Number  9    Number of Visits  12    Date for PT Re-Evaluation  06/19/18    Authorization Type  FOTO, Progress note every 10th visit; Kx modifier after 15th visit    PT Start Time  1038    PT Stop Time  1128    PT Time Calculation (min)  50 min    Activity Tolerance  Patient tolerated treatment well    Behavior During Therapy  [facility] for tasks assessed/performed       Past Medical History:  Diagnosis Date  . CAD (coronary artery disease)   . Colon polyps   . Dyslipidemia   . Herniated disc   . Hiatal hernia   . HTN (hypertension)   . Hyperlipidemia   . Kidney stones     Past Surgical History:  Procedure Laterality Date  . CORONARY ARTERY BYPASS GRAFT  2006   LIMA to LAD, SVG to RCA, SVG to circumflex, SVG to diagonal   . mole removed  2003  . right knee surgery  2008  . TONSILLECTOMY    . TOTAL KNEE ARTHROPLASTY Right 04/27/2018   Procedure: TOTAL KNEE ARTHROPLASTY RIGHT;  Surgeon: Gaynelle Arabian, MD;  Location: [facility];  Service: Orthopedics;  Laterality: Right;  2min    There were no vitals filed for this visit.  Subjective Assessment - 05/20/18 1215    Subjective  Doing real well.  Swelling coming down.    Pertinent History  right TKA 04/27/2018; CAD, HTN, herniated lumbar disc, CAD, CABG    Limitations  Standing;Walking;House hold activities    Diagnostic tests  x-ray, MRI    Patient Stated Goals  walk without AD and return to normal.     Currently in Pain?  Yes    Pain Score  5     Pain Location  Knee    Pain Orientation  Right    Pain Descriptors / Indicators  Tightness;Discomfort    Pain Type   Surgical pain    Pain Onset  1 to 4 weeks ago         [facility] PT Assessment - 05/20/18 0001      AROM   Right Knee Flexion  115                   [facility] Adult PT Treatment/Exercise - 05/20/18 0001      Exercises   Exercises  Knee/Hip      Knee/Hip Exercises: Aerobic   Recumbent Bike  16 minutes at level 2.      Knee/Hip Exercises: Machines for Strengthening   Cybex Knee Extension  10# x 4 minutes.    Cybex Knee Flexion  30# x 4 minutes.      Modalities   Modalities  Health visitor Stimulation Location  right knee.    Electrical Stimulation Action  IFC    Electrical Stimulation Parameters  1-10 Hz x 20 minutes.    Electrical Stimulation Goals  Edema;Pain      Vasopneumatic   Number Minutes Vasopneumatic   20 minutes    Vasopnuematic Location   --  Right knee.   Vasopneumatic Pressure  Medium               PT Short Term Goals - 05/11/18 1300      PT SHORT TERM GOAL #1   Title  Patient will be independent with HEP provided by hospital.    Time  2    Period  Weeks    Status  Achieved      PT SHORT TERM GOAL #2   Title  Patient will demonstrate 95+ degrees of right knee flexion AROM to improve ability to perform functional tasks.    Time  2    Period  Weeks    Status  Achieved      PT SHORT TERM GOAL #3   Title  Patient will demonstrate 3 degrees or less of right knee extension AROM to improve gait mechanics.     Time  2    Period  Weeks    Status  On-going        PT Long Term Goals - 05/11/18 1301      PT LONG TERM GOAL #1   Title  Patient will be independent with advanced HEP    Time  4    Period  Weeks    Status  On-going      PT LONG TERM GOAL #2   Title  Patient will demonstrate 120+ degrees of right knee flexion AROM to improve ability to perform functional tasks.     Time  4    Period  Weeks    Status  On-going      PT LONG TERM GOAL #3   Title  Patient  will demonstrate 0 degrees of right knee extension AROM to improve gait mechanics.     Time  4    Period  Weeks    Status  On-going      PT LONG TERM GOAL #4   Title  Patient will negotiate steps with a reciprocating gait pattern with one rail to safely access bedroom.     Time  4    Period  Weeks    Status  On-going      PT LONG TERM GOAL #5   Title  Patient will ambulate community distances without an AD and right knee pain less than 3/10.    Time  4    Period  Weeks    Status  On-going            Plan - 05/20/18 1213    Clinical Impression Statement  Excellent job with the addition of weight machines today.  His active right knee flexion= 115 degrees today.  He had some palpable pain along his right ITB (especially portion) that responded well to short duration STW/M.    Stability/Clinical Decision Making  Stable/Uncomplicated    Rehab Potential  Good    PT Frequency  3x / week    PT Duration  4 weeks    PT Treatment/Interventions  ADLs/Self Care Home Management;Electrical Stimulation;Moist Heat;Cryotherapy;Manual techniques;Dry needling;Passive range of motion;Neuromuscular re-education;Functional mobility training;Stair training;Gait training;Therapeutic activities;Therapeutic exercise;Balance training;Patient/family education;Taping;Vasopneumatic Device    PT Next Visit Plan  Weight machines (quads, hams and leg press).    PT Home Exercise Plan  heel prop for extension    Consulted and Agree with Plan of Care  Patient       Patient will benefit from skilled therapeutic intervention in order to improve the following deficits and impairments:  Pain, Decreased activity tolerance, Decreased  endurance, Decreased range of motion, Decreased strength, Difficulty walking  Visit Diagnosis: Acute pain of right knee  Difficulty in walking, not elsewhere classified  Stiffness of right knee, not elsewhere classified     Problem List Patient Active Problem List   Diagnosis  Date Noted  . Osteoarthritis, knee 04/28/2018  . OA (osteoarthritis) of knee 04/27/2018  . Osteoarthritis of knee 05/08/2017  . Tinnitus of both ears 09/10/2016  . Ventral hernia without obstruction or gangrene 03/28/2016  . AAA (abdominal aortic aneurysm) without rupture ([place]) 07/20/2015  . Obstructive sleep apnea 03/17/2014  . Erectile dysfunction due to arterial insufficiency 01/12/2014  . Thrombocytopenia ([place]) 12/30/2013  . Allergic rhinitis 10/01/2012  . BPH (benign prostatic hyperplasia) 10/01/2012  . PVC's (premature ventricular contractions) 09/01/2009  . COLONIC POLYPS 04/14/2009  . Hyperlipidemia LDL goal <70 04/14/2009  . Essential hypertension 04/14/2009  . Coronary atherosclerosis 04/14/2009  . HIATAL HERNIA 04/14/2009  . HERNIATED DISC 04/14/2009    Divina Neale, [place] MPT 05/20/2018, 12:19 PM  [facility] [place], [place], [address] Phone: [phone]   Fax:  [phone]  Name: [name] [name] MRN: [id number] Date of Birth: 10-23-1940

## 2018-05-21 ENCOUNTER — Ambulatory Visit: Payer: Medicare Other | Admitting: Family Medicine

## 2018-05-22 ENCOUNTER — Encounter: Payer: Medicare Other | Admitting: Physical Therapy

## 2018-05-22 ENCOUNTER — Ambulatory Visit (INDEPENDENT_AMBULATORY_CARE_PROVIDER_SITE_OTHER): Payer: Medicare Other | Admitting: Nurse Practitioner

## 2018-05-22 ENCOUNTER — Encounter: Payer: Self-pay | Admitting: Nurse Practitioner

## 2018-05-22 ENCOUNTER — Other Ambulatory Visit: Payer: Self-pay

## 2018-05-22 VITALS — BP 142/75 | HR 67 | Temp 97.4°F | Ht 73.0 in | Wt 213.0 lb

## 2018-05-22 DIAGNOSIS — Z8052 Family history of malignant neoplasm of bladder: Secondary | ICD-10-CM

## 2018-05-22 DIAGNOSIS — E785 Hyperlipidemia, unspecified: Secondary | ICD-10-CM

## 2018-05-22 DIAGNOSIS — R5383 Other fatigue: Secondary | ICD-10-CM

## 2018-05-22 DIAGNOSIS — E559 Vitamin D deficiency, unspecified: Secondary | ICD-10-CM

## 2018-05-22 DIAGNOSIS — I10 Essential (primary) hypertension: Secondary | ICD-10-CM | POA: Diagnosis not present

## 2018-05-22 DIAGNOSIS — D696 Thrombocytopenia, unspecified: Secondary | ICD-10-CM | POA: Diagnosis not present

## 2018-05-22 DIAGNOSIS — I2510 Atherosclerotic heart disease of native coronary artery without angina pectoris: Secondary | ICD-10-CM | POA: Diagnosis not present

## 2018-05-22 NOTE — Progress Notes (Signed)
   Subjective:    Patient ID: [name] [name], male    DOB: 1940-06-14, 78 y.o.   MRN: [id number]   Chief Complaint: Dizziness (bp and pulse normal at home / just feels bad - recent right Total knee) and decreased concentration   HPI Patient had total knee replacement 4 weeks ago and has been doing well. He has stopped most of pain meds but he took an ultram this morning. He has felt out of it today and sleepy. He drank 3 glasses of water before coming to office and now he feels better.   Review of Systems  Constitutional: Negative for chills and fever.  Respiratory: Negative.   Cardiovascular: Negative.   Musculoskeletal: Negative.   Neurological: Positive for weakness. Negative for dizziness.  Psychiatric/Behavioral: Negative.   All other systems reviewed and are negative.      Objective:   Physical Exam Constitutional:      Appearance: Normal appearance.  HENT:     Right Ear: Tympanic membrane, ear canal and external ear normal.     Left Ear: Tympanic membrane and ear canal normal.     Mouth/Throat:     Mouth: Mucous membranes are moist.  Eyes:     Extraocular Movements: Extraocular movements intact.     Pupils: Pupils are equal, round, and reactive to light.  Cardiovascular:     Rate and Rhythm: Normal rate and regular rhythm.     Heart sounds: Normal heart sounds.  Pulmonary:     Effort: Pulmonary effort is normal.     Breath sounds: Normal breath sounds.  Skin:    General: Skin is warm.  Neurological:     General: No focal deficit present.     Mental Status: He is alert and oriented to person, place, and time.  Psychiatric:        Mood and Affect: Mood normal.        Behavior: Behavior normal.    BP (!) 142/75   Pulse 67   Temp (!) 97.4 F (36.3 C) (Oral)   Ht 6\' 1"  (1.854 m)   Wt 213 lb (96.6 kg)   BMI 28.10 kg/m         Assessment & Plan:  [name] in today with chief complaint of Dizziness (bp and pulse normal at home / just feels  bad - recent right Total knee) and decreased concentration   1. Fatigue, unspecified type Probably due to ultram  Will have labs drawn that have already been ordered by DR. Laurance Flatten Will call with results tomorrow.  Mary-Margaret Hassell Done, FNP

## 2018-05-25 ENCOUNTER — Ambulatory Visit: Payer: Medicare Other | Admitting: Physical Therapy

## 2018-05-25 ENCOUNTER — Telehealth: Payer: Self-pay | Admitting: Family Medicine

## 2018-05-25 LAB — HEPATIC FUNCTION PANEL
ALT: 14 IU/L (ref 0–44)
AST: 19 IU/L (ref 0–40)
Albumin: 4.4 g/dL (ref 3.7–4.7)
Alkaline Phosphatase: 93 IU/L (ref 39–117)
Bilirubin Total: 0.4 mg/dL (ref 0.0–1.2)
Bilirubin, Direct: 0.16 mg/dL (ref 0.00–0.40)
Total Protein: 6.6 g/dL (ref 6.0–8.5)

## 2018-05-25 LAB — CBC WITH DIFFERENTIAL/PLATELET
Basophils Absolute: 0.1 10*3/uL (ref 0.0–0.2)
Basos: 1 %
EOS (ABSOLUTE): 0 10*3/uL (ref 0.0–0.4)
Eos: 1 %
Hematocrit: 35.4 % — ABNORMAL LOW (ref 37.5–51.0)
Hemoglobin: 12.1 g/dL — ABNORMAL LOW (ref 13.0–17.7)
IMMATURE GRANS (ABS): 0 10*3/uL (ref 0.0–0.1)
Immature Granulocytes: 0 %
Lymphocytes Absolute: 1 10*3/uL (ref 0.7–3.1)
Lymphs: 13 %
MCH: 31.2 pg (ref 26.6–33.0)
MCHC: 34.2 g/dL (ref 31.5–35.7)
MCV: 91 fL (ref 79–97)
Monocytes Absolute: 0.6 10*3/uL (ref 0.1–0.9)
Monocytes: 8 %
Neutrophils Absolute: 6.2 10*3/uL (ref 1.4–7.0)
Neutrophils: 77 %
Platelets: 239 10*3/uL (ref 150–450)
RBC: 3.88 x10E6/uL — ABNORMAL LOW (ref 4.14–5.80)
RDW: 13.4 % (ref 11.6–15.4)
WBC: 8 10*3/uL (ref 3.4–10.8)

## 2018-05-25 LAB — BMP8+EGFR
BUN/Creatinine Ratio: 18 (ref 10–24)
BUN: 17 mg/dL (ref 8–27)
CO2: 23 mmol/L (ref 20–29)
Calcium: 9.3 mg/dL (ref 8.6–10.2)
Chloride: 101 mmol/L (ref 96–106)
Creatinine, Ser: 0.94 mg/dL (ref 0.76–1.27)
GFR calc Af Amer: 91 mL/min/{1.73_m2} (ref >59)
GFR calc non Af Amer: 79 mL/min/{1.73_m2} (ref >59)
Glucose: 116 mg/dL — ABNORMAL HIGH (ref 65–99)
Potassium: 4.3 mmol/L (ref 3.5–5.2)
Sodium: 140 mmol/L (ref 134–144)

## 2018-05-25 LAB — LIPID PANEL
Chol/HDL Ratio: 2.2 ratio (ref 0.0–5.0)
Cholesterol, Total: 100 mg/dL (ref 100–199)
HDL: 46 mg/dL (ref >39)
LDL CALC: 42 mg/dL (ref 0–99)
Triglycerides: 61 mg/dL (ref 0–149)
VLDL Cholesterol Cal: 12 mg/dL (ref 5–40)

## 2018-05-25 LAB — VITAMIN D 25 HYDROXY (VIT D DEFICIENCY, FRACTURES): Vit D, 25-Hydroxy: 66.7 ng/mL (ref 30.0–100.0)

## 2018-05-25 NOTE — Telephone Encounter (Signed)
Drink plenty of fluids take Tylenol for pain and monitor symptoms and call us back daily if necessary and especially if not getting any better.

## 2018-05-25 NOTE — Telephone Encounter (Signed)
Called pt (and wife)  He had surgery on 2/24 Came home on 2/25 Started xorelto 2/25 took for 20 days - finished 05/18/18. He states that he has the following: Hot spells Cold spells Temp runs around 99.2-99.5 each night Trouble sleeping  Emotional / irritable Stomach hurts/ feels "unsettled" No appetite  Took tramadol and oxycodone - none since last Friday  Wants some input on these issues

## 2018-05-25 NOTE — Telephone Encounter (Signed)
Pt aware.

## 2018-05-27 ENCOUNTER — Encounter: Payer: Medicare Other | Admitting: Physical Therapy

## 2018-05-27 ENCOUNTER — Telehealth: Payer: Self-pay | Admitting: Family Medicine

## 2018-05-27 NOTE — Telephone Encounter (Signed)
Pt questioned melatonin - aware safe and OTC - he may try this tonight.

## 2018-05-29 ENCOUNTER — Encounter: Payer: Medicare Other | Admitting: Physical Therapy

## 2018-05-31 NOTE — Assessment & Plan Note (Signed)
Discussed use of CPAP while hospitalized for TKR.

## 2018-05-31 NOTE — Assessment & Plan Note (Signed)
He continues to benefit from CPAP with improved sleep. Download confirms good compliance and control.

## 2018-06-01 ENCOUNTER — Other Ambulatory Visit: Payer: Self-pay

## 2018-06-01 ENCOUNTER — Ambulatory Visit: Payer: Medicare Other | Admitting: Physical Therapy

## 2018-06-01 DIAGNOSIS — M25561 Pain in right knee: Secondary | ICD-10-CM

## 2018-06-01 DIAGNOSIS — R262 Difficulty in walking, not elsewhere classified: Secondary | ICD-10-CM

## 2018-06-01 DIAGNOSIS — M25661 Stiffness of right knee, not elsewhere classified: Secondary | ICD-10-CM

## 2018-06-01 NOTE — Therapy (Signed)
[facility] [place], [place], [address] Phone: [phone]   Fax:  [phone]  Physical Therapy Treatment  Patient Details  Name: [name] [name] MRN: [id number] Date of Birth: March 20, 1940 Referring Provider (PT): Gaynelle Arabian, MD   Encounter Date: 06/01/2018  PT End of Session - 06/01/18 1119    Visit Number  10    Number of Visits  12    Date for PT Re-Evaluation  06/19/18    Authorization Type  FOTO, Progress note every 10th visit; Kx modifier after 15th visit    PT Start Time  1119    PT Stop Time  1213    PT Time Calculation (min)  54 min    Activity Tolerance  Patient tolerated treatment well    Behavior During Therapy  [facility] for tasks assessed/performed       Past Medical History:  Diagnosis Date  . CAD (coronary artery disease)   . Colon polyps   . Dyslipidemia   . Herniated disc   . Hiatal hernia   . HTN (hypertension)   . Hyperlipidemia   . Kidney stones     Past Surgical History:  Procedure Laterality Date  . CORONARY ARTERY BYPASS GRAFT  2006   LIMA to LAD, SVG to RCA, SVG to circumflex, SVG to diagonal   . mole removed  2003  . right knee surgery  2008  . TONSILLECTOMY    . TOTAL KNEE ARTHROPLASTY Right 04/27/2018   Procedure: TOTAL KNEE ARTHROPLASTY RIGHT;  Surgeon: Gaynelle Arabian, MD;  Location: [facility];  Service: Orthopedics;  Laterality: Right;  22min    There were no vitals filed for this visit.  Subjective Assessment - 06/01/18 1116    Subjective  COVID 19 screening performed on patient prior to entering building. Reports remaining compliant with HEP at home and is off pain medications as well.    Pertinent History  right TKA 04/27/2018; CAD, HTN, herniated lumbar disc, CAD, CABG    Limitations  Standing;Walking;House hold activities    Diagnostic tests  x-ray, MRI    Patient Stated Goals  walk without AD and return to normal.     Currently in Pain?  Yes    Pain Score  4     Pain  Location  Knee    Pain Orientation  Right    Pain Descriptors / Indicators  Discomfort    Pain Type  Surgical pain    Pain Onset  1 to 4 weeks ago    Pain Frequency  Intermittent         [facility] PT Assessment - 06/01/18 0001      Assessment   Medical Diagnosis  Presence of right artificial knee joint    Referring Provider (PT)  Gaynelle Arabian, MD    Onset Date/Surgical Date  04/27/18    Next MD Visit  06/02/2018    Prior Therapy  no      Precautions   Precaution Comments  no ultrasound      Restrictions   Weight Bearing Restrictions  No      Observation/Other Assessments   Focus on Therapeutic Outcomes (FOTO)   43% limitation; Current status CK      Observation/Other Assessments-Edema    Edema  Circumferential      Circumferential Edema   Circumferential - Right  46.6 cm    Circumferential - Left   43.3 cm      AROM   Overall AROM   Deficits  AROM Assessment Site  Knee    Right/Left Knee  Right    Right Knee Extension  7    Right Knee Flexion  116                   [facility] Adult PT Treatment/Exercise - 06/01/18 0001      Knee/Hip Exercises: Aerobic   Recumbent Bike  L3 x11 min      Knee/Hip Exercises: Machines for Strengthening   Cybex Knee Extension  10# 3x10 reps    Cybex Knee Flexion  30# 3x10 reps      Knee/Hip Exercises: Standing   Forward Lunges  Right;10 reps;3 seconds   116 deg measured during lunge   Lateral Step Up  Right;20 reps;Hand Hold: 2;Step Height: 6"    Forward Step Up  Right;20 reps;Hand Hold: 2;Step Height: 6"    Functional Squat  10 reps      Modalities   Modalities  Electrical Stimulation;Vasopneumatic      Acupuncturist Location  R knee    Electrical Stimulation Action  IFC    Electrical Stimulation Parameters  80-150 hz x15 min    Electrical Stimulation Goals  Edema;Pain      Vasopneumatic   Number Minutes Vasopneumatic   15 minutes    Vasopnuematic Location   Knee    Vasopneumatic  Pressure  Low    Vasopneumatic Temperature   34             PT Education - 06/01/18 1201    Education Details  HEP- HS and ITB stretch, prone hang    Person(s) Educated  Patient    Methods  Explanation;Demonstration;Verbal cues;Handout    Comprehension  Verbalized understanding       PT Short Term Goals - 05/11/18 1300      PT SHORT TERM GOAL #1   Title  Patient will be independent with HEP provided by hospital.    Time  2    Period  Weeks    Status  Achieved      PT SHORT TERM GOAL #2   Title  Patient will demonstrate 95+ degrees of right knee flexion AROM to improve ability to perform functional tasks.    Time  2    Period  Weeks    Status  Achieved      PT SHORT TERM GOAL #3   Title  Patient will demonstrate 3 degrees or less of right knee extension AROM to improve gait mechanics.     Time  2    Period  Weeks    Status  On-going        PT Long Term Goals - 06/01/18 1214      PT LONG TERM GOAL #1   Title  Patient will be independent with advanced HEP    Time  4    Period  Weeks    Status  Achieved      PT LONG TERM GOAL #2   Title  Patient will demonstrate 120+ degrees of right knee flexion AROM to improve ability to perform functional tasks.     Time  4    Period  Weeks    Status  On-going      PT LONG TERM GOAL #3   Title  Patient will demonstrate 0 degrees of right knee extension AROM to improve gait mechanics.     Time  4    Period  Weeks    Status  On-going  PT LONG TERM GOAL #4   Title  Patient will negotiate steps with a reciprocating gait pattern with one rail to safely access bedroom.     Time  4    Period  Weeks    Status  On-going      PT LONG TERM GOAL #5   Title  Patient will ambulate community distances without an AD and right knee pain less than 3/10.    Time  4    Period  Weeks    Status  Achieved            Plan - 06/01/18 1207    Clinical Impression Statement  Patient progressed with knee strengthening exercises  today in clinic. Patient not taking any prescription pain medication currently. Indicates pain with R ITB as well with discomfort in R hip. Patient able to demonstrate good squat technique after demo and VCs with no compensation noted. No AD utilized by patient today. New HS, ITB stretches provided for HEP and demo and VCs for education. Patient verbalized understanding but further instructions may be required for new HEP. AROM of R knee measured as 7-116 deg and 3.3 cm difference with R knee edema > L knee edema. Normal modalities response noted following removal of the modalities.    Stability/Clinical Decision Making  Stable/Uncomplicated    Rehab Potential  Good    PT Frequency  3x / week    PT Duration  4 weeks    PT Treatment/Interventions  ADLs/Self Care Home Management;Electrical Stimulation;Moist Heat;Cryotherapy;Manual techniques;Dry needling;Passive range of motion;Neuromuscular re-education;Functional mobility training;Stair training;Gait training;Therapeutic activities;Therapeutic exercise;Balance training;Patient/family education;Taping;Vasopneumatic Device    PT Next Visit Plan  Progress to functional strengthening with focus on L knee per MPT POC.    PT Home Exercise Plan  HEP- HS and ITB stretch, prone hang    Consulted and Agree with Plan of Care  Patient       Patient will benefit from skilled therapeutic intervention in order to improve the following deficits and impairments:  Pain, Decreased activity tolerance, Decreased endurance, Decreased range of motion, Decreased strength, Difficulty walking  Visit Diagnosis: Acute pain of right knee  Difficulty in walking, not elsewhere classified  Stiffness of right knee, not elsewhere classified     Problem List Patient Active Problem List   Diagnosis Date Noted  . Osteoarthritis, knee 04/28/2018  . OA (osteoarthritis) of knee 04/27/2018  . Osteoarthritis of knee 05/08/2017  . Tinnitus of both ears 09/10/2016  . Ventral  hernia without obstruction or gangrene 03/28/2016  . AAA (abdominal aortic aneurysm) without rupture ([place]) 07/20/2015  . Obstructive sleep apnea 03/17/2014  . Erectile dysfunction due to arterial insufficiency 01/12/2014  . Thrombocytopenia ([place]) 12/30/2013  . Allergic rhinitis 10/01/2012  . BPH (benign prostatic hyperplasia) 10/01/2012  . PVC's (premature ventricular contractions) 09/01/2009  . COLONIC POLYPS 04/14/2009  . Hyperlipidemia LDL goal <70 04/14/2009  . Essential hypertension 04/14/2009  . Coronary atherosclerosis 04/14/2009  . HIATAL HERNIA 04/14/2009  . HERNIATED DISC 04/14/2009    Standley Brooking, PTA 06/01/18 12:15 PM   [facility] [place], [place], [address] Phone: [phone]   Fax:  [phone]  Name: [name] [name] MRN: [id number] Date of Birth: 07-08-1940

## 2018-06-02 DIAGNOSIS — Z471 Aftercare following joint replacement surgery: Secondary | ICD-10-CM | POA: Diagnosis not present

## 2018-06-02 DIAGNOSIS — Z96651 Presence of right artificial knee joint: Secondary | ICD-10-CM | POA: Diagnosis not present

## 2018-06-03 ENCOUNTER — Ambulatory Visit: Payer: Medicare Other | Admitting: Physical Therapy

## 2018-06-03 ENCOUNTER — Other Ambulatory Visit: Payer: Self-pay

## 2018-06-03 ENCOUNTER — Ambulatory Visit: Payer: Medicare Other | Attending: Orthopedic Surgery | Admitting: Physical Therapy

## 2018-06-03 DIAGNOSIS — M25661 Stiffness of right knee, not elsewhere classified: Secondary | ICD-10-CM | POA: Diagnosis not present

## 2018-06-03 DIAGNOSIS — R262 Difficulty in walking, not elsewhere classified: Secondary | ICD-10-CM | POA: Diagnosis not present

## 2018-06-03 DIAGNOSIS — M25561 Pain in right knee: Secondary | ICD-10-CM | POA: Insufficient documentation

## 2018-06-03 NOTE — Therapy (Signed)
[facility] [place], [place], [address] Phone: [phone]   Fax:  [phone]  Physical Therapy Treatment  Patient Details  Name: [name] [name] MRN: [id number] Date of Birth: 16-Jul-1940 Referring Provider (PT): Gaynelle Arabian, MD   Encounter Date: 06/03/2018  PT End of Session - 06/03/18 1238    Visit Number  11    Number of Visits  16    Date for PT Re-Evaluation  06/19/18    Authorization Type  FOTO, Progress note every 10th visit; Kx modifier after 15th visit    PT Start Time  1124    PT Stop Time  1223    PT Time Calculation (min)  59 min    Activity Tolerance  Patient tolerated treatment well    Behavior During Therapy  [facility] for tasks assessed/performed       Past Medical History:  Diagnosis Date  . CAD (coronary artery disease)   . Colon polyps   . Dyslipidemia   . Herniated disc   . Hiatal hernia   . HTN (hypertension)   . Hyperlipidemia   . Kidney stones     Past Surgical History:  Procedure Laterality Date  . CORONARY ARTERY BYPASS GRAFT  2006   LIMA to LAD, SVG to RCA, SVG to circumflex, SVG to diagonal   . mole removed  2003  . right knee surgery  2008  . TONSILLECTOMY    . TOTAL KNEE ARTHROPLASTY Right 04/27/2018   Procedure: TOTAL KNEE ARTHROPLASTY RIGHT;  Surgeon: Gaynelle Arabian, MD;  Location: [facility];  Service: Orthopedics;  Laterality: Right;  19min    There were no vitals filed for this visit.  Subjective Assessment - 06/03/18 1147    Subjective  COVID-19  screen performed prior to patient entering clinic.  Dr was very pleased with my progress.    Pertinent History  right TKA 04/27/2018; CAD, HTN, herniated lumbar disc, CAD, CABG    Limitations  Standing;Walking;House hold activities    Diagnostic tests  x-ray, MRI    Patient Stated Goals  walk without AD and return to normal.     Currently in Pain?  Yes    Pain Score  4                        [facility] Adult PT  Treatment/Exercise - 06/03/18 0001      Exercises   Exercises  Knee/Hip      Knee/Hip Exercises: Aerobic   Recumbent Bike  Level 2 x 18 minutes.      Knee/Hip Exercises: Machines for Strengthening   Cybex Knee Extension  10# x 5 minutes.    Cybex Leg Press  3 plates x 5 minutes.      Modalities   Modalities  Health visitor Stimulation Location  Right knee.    Electrical Stimulation Action  IFC    Electrical Stimulation Parameters  1-10 Hz x 20 minutes.    Electrical Stimulation Goals  Edema;Pain      Vasopneumatic   Number Minutes Vasopneumatic   20 minutes    Vasopneumatic Pressure  Medium               PT Short Term Goals - 05/11/18 1300      PT SHORT TERM GOAL #1   Title  Patient will be independent with HEP provided by hospital.    Time  2  Period  Weeks    Status  Achieved      PT SHORT TERM GOAL #2   Title  Patient will demonstrate 95+ degrees of right knee flexion AROM to improve ability to perform functional tasks.    Time  2    Period  Weeks    Status  Achieved      PT SHORT TERM GOAL #3   Title  Patient will demonstrate 3 degrees or less of right knee extension AROM to improve gait mechanics.     Time  2    Period  Weeks    Status  On-going        PT Long Term Goals - 06/01/18 1214      PT LONG TERM GOAL #1   Title  Patient will be independent with advanced HEP    Time  4    Period  Weeks    Status  Achieved      PT LONG TERM GOAL #2   Title  Patient will demonstrate 120+ degrees of right knee flexion AROM to improve ability to perform functional tasks.     Time  4    Period  Weeks    Status  On-going      PT LONG TERM GOAL #3   Title  Patient will demonstrate 0 degrees of right knee extension AROM to improve gait mechanics.     Time  4    Period  Weeks    Status  On-going      PT LONG TERM GOAL #4   Title  Patient will negotiate steps with a reciprocating gait  pattern with one rail to safely access bedroom.     Time  4    Period  Weeks    Status  On-going      PT LONG TERM GOAL #5   Title  Patient will ambulate community distances without an AD and right knee pain less than 3/10.    Time  4    Period  Weeks    Status  Achieved            Plan - 06/03/18 1213    Clinical Impression Statement  Patient is doing very well with prgression toward goals.  We are now advancing with strengthening exercises.  He states that he would like to continue a couple work weeks to focus on strengthening.   Added leg press to regimen which patient performed without complaint.    PT Treatment/Interventions  ADLs/Self Care Home Management;Electrical Stimulation;Moist Heat;Cryotherapy;Manual techniques;Dry needling;Passive range of motion;Neuromuscular re-education;Functional mobility training;Stair training;Gait training;Therapeutic activities;Therapeutic exercise;Balance training;Patient/family education;Taping;Vasopneumatic Device    PT Next Visit Plan  Progress to functional strengthening with focus on L knee per MPT POC.    PT Home Exercise Plan  HEP- HS and ITB stretch, prone hang    Consulted and Agree with Plan of Care  Patient       Patient will benefit from skilled therapeutic intervention in order to improve the following deficits and impairments:  Pain, Decreased activity tolerance, Decreased endurance, Decreased range of motion, Decreased strength, Difficulty walking  Visit Diagnosis: Acute pain of right knee - Plan: PT plan of care cert/re-cert  Difficulty in walking, not elsewhere classified - Plan: PT plan of care cert/re-cert  Stiffness of right knee, not elsewhere classified - Plan: PT plan of care cert/re-cert     Problem List Patient Active Problem List   Diagnosis Date Noted  . Osteoarthritis, knee 04/28/2018  . OA (  osteoarthritis) of knee 04/27/2018  . Osteoarthritis of knee 05/08/2017  . Tinnitus of both ears 09/10/2016  .  Ventral hernia without obstruction or gangrene 03/28/2016  . AAA (abdominal aortic aneurysm) without rupture ([place]) 07/20/2015  . Obstructive sleep apnea 03/17/2014  . Erectile dysfunction due to arterial insufficiency 01/12/2014  . Thrombocytopenia ([place]) 12/30/2013  . Allergic rhinitis 10/01/2012  . BPH (benign prostatic hyperplasia) 10/01/2012  . PVC's (premature ventricular contractions) 09/01/2009  . COLONIC POLYPS 04/14/2009  . Hyperlipidemia LDL goal <70 04/14/2009  . Essential hypertension 04/14/2009  . Coronary atherosclerosis 04/14/2009  . HIATAL HERNIA 04/14/2009  . HERNIATED DISC 04/14/2009    Nyiesha Beever, [place] MPT 06/03/2018, 12:48 PM  [facility] [place], [place], [address] Phone: [phone]   Fax:  [phone]  Name: [name] [name] MRN: [id number] Date of Birth: January 20, 1941

## 2018-06-05 ENCOUNTER — Encounter: Payer: Self-pay | Admitting: Physical Therapy

## 2018-06-05 ENCOUNTER — Ambulatory Visit: Payer: Medicare Other | Admitting: Physical Therapy

## 2018-06-05 ENCOUNTER — Encounter: Payer: Medicare Other | Admitting: Physical Therapy

## 2018-06-05 ENCOUNTER — Other Ambulatory Visit: Payer: Self-pay

## 2018-06-05 DIAGNOSIS — R262 Difficulty in walking, not elsewhere classified: Secondary | ICD-10-CM | POA: Diagnosis not present

## 2018-06-05 DIAGNOSIS — M25561 Pain in right knee: Secondary | ICD-10-CM

## 2018-06-05 DIAGNOSIS — M25661 Stiffness of right knee, not elsewhere classified: Secondary | ICD-10-CM | POA: Diagnosis not present

## 2018-06-05 NOTE — Therapy (Signed)
[facility] [place], [place], [address] Phone: [phone]   Fax:  [phone]  Physical Therapy Treatment  Patient Details  Name: [name] [name] MRN: [id number] Date of Birth: 04-29-1940 Referring Provider (PT): Gaynelle Arabian, MD   Encounter Date: 06/05/2018  PT End of Session - 06/05/18 1129    Visit Number  12    Number of Visits  16    Date for PT Re-Evaluation  06/19/18    Authorization Type  FOTO, Progress note every 10th visit; Kx modifier after 15th visit    PT Start Time  1126    PT Stop Time  1218    PT Time Calculation (min)  52 min    Activity Tolerance  Patient tolerated treatment well    Behavior During Therapy  [facility] for tasks assessed/performed       Past Medical History:  Diagnosis Date  . CAD (coronary artery disease)   . Colon polyps   . Dyslipidemia   . Herniated disc   . Hiatal hernia   . HTN (hypertension)   . Hyperlipidemia   . Kidney stones     Past Surgical History:  Procedure Laterality Date  . CORONARY ARTERY BYPASS GRAFT  2006   LIMA to LAD, SVG to RCA, SVG to circumflex, SVG to diagonal   . mole removed  2003  . right knee surgery  2008  . TONSILLECTOMY    . TOTAL KNEE ARTHROPLASTY Right 04/27/2018   Procedure: TOTAL KNEE ARTHROPLASTY RIGHT;  Surgeon: Gaynelle Arabian, MD;  Location: [facility];  Service: Orthopedics;  Laterality: Right;  50min    There were no vitals filed for this visit.  Subjective Assessment - 06/05/18 1128    Subjective  Reports that he still has difficulty sleeping due to discomfort when lying down. Not taking any prescription pain medication at this time. COVID 19 screening performed prior to entering building.    Pertinent History  right TKA 04/27/2018; CAD, HTN, herniated lumbar disc, CAD, CABG    Limitations  Standing;Walking;House hold activities    Diagnostic tests  x-ray, MRI    Patient Stated Goals  walk without AD and return to normal.     Currently  in Pain?  Other (Comment)   No pain assessment provided by patient        [facility] PT Assessment - 06/05/18 0001      Assessment   Medical Diagnosis  Presence of right artificial knee joint    Referring Provider (PT)  Gaynelle Arabian, MD    Onset Date/Surgical Date  04/27/18    Next MD Visit  07/07/2018    Prior Therapy  no      Precautions   Precaution Comments  no ultrasound      Restrictions   Weight Bearing Restrictions  No                   [facility] Adult PT Treatment/Exercise - 06/05/18 0001      Knee/Hip Exercises: Aerobic   Recumbent Bike  L4 x15 min      Knee/Hip Exercises: Machines for Strengthening   Cybex Knee Extension  10# 3x10 reps BLE, x20 reps RLE only    Cybex Knee Flexion  30# 3x10 reps    Cybex Leg Press  2 pl, seat 8 x30 reps      Knee/Hip Exercises: Standing   Step Down  Right;2 sets;10 reps;Hand Hold: 2;Step Height: 4"   Heel dot    Functional  Squat  15 reps;3 seconds    SLS  Attempted golfers pick up to low table but unstable      Modalities   Modalities  Psychologist, educational Location  R knee    Electrical Stimulation Action  IFC    Electrical Stimulation Parameters  80-150 hz x15 min    Electrical Stimulation Goals  Pain;Edema      Vasopneumatic   Number Minutes Vasopneumatic   15 minutes    Vasopnuematic Location   Knee    Vasopneumatic Pressure  Medium    Vasopneumatic Temperature   71               PT Short Term Goals - 05/11/18 1300      PT SHORT TERM GOAL #1   Title  Patient will be independent with HEP provided by hospital.    Time  2    Period  Weeks    Status  Achieved      PT SHORT TERM GOAL #2   Title  Patient will demonstrate 95+ degrees of right knee flexion AROM to improve ability to perform functional tasks.    Time  2    Period  Weeks    Status  Achieved      PT SHORT TERM GOAL #3   Title  Patient will demonstrate 3 degrees or  less of right knee extension AROM to improve gait mechanics.     Time  2    Period  Weeks    Status  On-going        PT Long Term Goals - 06/01/18 1214      PT LONG TERM GOAL #1   Title  Patient will be independent with advanced HEP    Time  4    Period  Weeks    Status  Achieved      PT LONG TERM GOAL #2   Title  Patient will demonstrate 120+ degrees of right knee flexion AROM to improve ability to perform functional tasks.     Time  4    Period  Weeks    Status  On-going      PT LONG TERM GOAL #3   Title  Patient will demonstrate 0 degrees of right knee extension AROM to improve gait mechanics.     Time  4    Period  Weeks    Status  On-going      PT LONG TERM GOAL #4   Title  Patient will negotiate steps with a reciprocating gait pattern with one rail to safely access bedroom.     Time  4    Period  Weeks    Status  On-going      PT LONG TERM GOAL #5   Title  Patient will ambulate community distances without an AD and right knee pain less than 3/10.    Time  4    Period  Weeks    Status  Achieved            Plan - 06/05/18 1209    Clinical Impression Statement  Patient tolerated today's treatment well although limited with sleeping due to R knee discomfort. Patient also still experiencing R hip discomfort to which was concluded as bursitis by Dr. Maureen Ralphs. Patient guided through machine strengthening with knee extension completed with BLE and LLE. SLS iniatially directed for golfers pick up to prepare patient for playing golf. Patient still very unstable  and instructed at home to stand on RLE if standing at a countertop for safety. Normal modalities response noted following removal of the modalities.    Stability/Clinical Decision Making  Stable/Uncomplicated    Rehab Potential  Good    PT Frequency  3x / week    PT Duration  4 weeks    PT Treatment/Interventions  ADLs/Self Care Home Management;Electrical Stimulation;Moist Heat;Cryotherapy;Manual techniques;Dry  needling;Passive range of motion;Neuromuscular re-education;Functional mobility training;Stair training;Gait training;Therapeutic activities;Therapeutic exercise;Balance training;Patient/family education;Taping;Vasopneumatic Device    PT Next Visit Plan  Progress to functional strengthening with focus on L knee per MPT POC.    PT Home Exercise Plan  HEP- HS and ITB stretch, prone hang    Consulted and Agree with Plan of Care  Patient       Patient will benefit from skilled therapeutic intervention in order to improve the following deficits and impairments:  Pain, Decreased activity tolerance, Decreased endurance, Decreased range of motion, Decreased strength, Difficulty walking  Visit Diagnosis: Acute pain of right knee  Difficulty in walking, not elsewhere classified  Stiffness of right knee, not elsewhere classified     Problem List Patient Active Problem List   Diagnosis Date Noted  . Osteoarthritis, knee 04/28/2018  . OA (osteoarthritis) of knee 04/27/2018  . Osteoarthritis of knee 05/08/2017  . Tinnitus of both ears 09/10/2016  . Ventral hernia without obstruction or gangrene 03/28/2016  . AAA (abdominal aortic aneurysm) without rupture ([place]) 07/20/2015  . Obstructive sleep apnea 03/17/2014  . Erectile dysfunction due to arterial insufficiency 01/12/2014  . Thrombocytopenia ([place]) 12/30/2013  . Allergic rhinitis 10/01/2012  . BPH (benign prostatic hyperplasia) 10/01/2012  . PVC's (premature ventricular contractions) 09/01/2009  . COLONIC POLYPS 04/14/2009  . Hyperlipidemia LDL goal <70 04/14/2009  . Essential hypertension 04/14/2009  . Coronary atherosclerosis 04/14/2009  . HIATAL HERNIA 04/14/2009  . HERNIATED DISC 04/14/2009    Standley Brooking, PTA 06/05/2018, 12:37 PM  [facility] [address] [place], [place], [address] Phone: [phone]   Fax:  [phone]  Name: Janet Decesare MRN: [id number] Date of Birth:  [birth year]

## 2018-06-08 ENCOUNTER — Encounter: Payer: Medicare Other | Admitting: Physical Therapy

## 2018-06-08 ENCOUNTER — Telehealth: Payer: Self-pay | Admitting: *Deleted

## 2018-06-08 ENCOUNTER — Other Ambulatory Visit: Payer: Self-pay

## 2018-06-08 ENCOUNTER — Other Ambulatory Visit: Payer: Self-pay | Admitting: *Deleted

## 2018-06-08 ENCOUNTER — Other Ambulatory Visit: Payer: Medicare Other

## 2018-06-08 DIAGNOSIS — S30861A Insect bite (nonvenomous) of abdominal wall, initial encounter: Secondary | ICD-10-CM

## 2018-06-08 DIAGNOSIS — W57XXXA Bitten or stung by nonvenomous insect and other nonvenomous arthropods, initial encounter: Principal | ICD-10-CM

## 2018-06-08 NOTE — Telephone Encounter (Signed)
Pt aware - will come for lab now.

## 2018-06-08 NOTE — Telephone Encounter (Signed)
Increase Prilosec to 20 bid ac breakfast and supper. Have patient come to the urgent care entrance and get blood drawn for tick titers.

## 2018-06-08 NOTE — Telephone Encounter (Signed)
Sounds like both were deer ticks, one was smaller than the other -but neither were as large as a dog tick.  He is unsure how long either were attached.

## 2018-06-08 NOTE — Telephone Encounter (Signed)
Do you want him to come in for titers? Please address the first note = about prilosec / stomach issues

## 2018-06-08 NOTE — Telephone Encounter (Signed)
Use [place] off. Check self regularly. Get blood work for Lyme and RMSF . If positive or Increased titers, will need to be treated with doxycycline. Watch for Bullseye rash around bite site.

## 2018-06-08 NOTE — Telephone Encounter (Signed)
Pt Called : 2 tick bites, one on stomach-red, one on leg. No rings around them at this time. Should we call in DOXY?  Also - stomach is still queasy at times. Taking prilosec 20 mg in the AM - do you think he should try BID OR do you have other suggestions?   fyi - sleep is better!

## 2018-06-08 NOTE — Telephone Encounter (Signed)
Deer ticks or dog ticks?  How long had the ticks been on the body?

## 2018-06-09 ENCOUNTER — Encounter: Payer: Self-pay | Admitting: Physical Therapy

## 2018-06-09 ENCOUNTER — Ambulatory Visit: Payer: Medicare Other | Admitting: Physical Therapy

## 2018-06-09 DIAGNOSIS — M25561 Pain in right knee: Secondary | ICD-10-CM

## 2018-06-09 DIAGNOSIS — R262 Difficulty in walking, not elsewhere classified: Secondary | ICD-10-CM

## 2018-06-09 DIAGNOSIS — M25661 Stiffness of right knee, not elsewhere classified: Secondary | ICD-10-CM

## 2018-06-09 LAB — ROCKY MTN SPOTTED FVR ABS PNL(IGG+IGM)
RMSF IgG: NEGATIVE
RMSF IgM: 0.7 index (ref 0.00–0.89)

## 2018-06-09 LAB — LYME AB/WESTERN BLOT REFLEX
LYME DISEASE AB, QUANT, IGM: <0.8 index (ref 0.00–0.79)
Lyme IgG/IgM Ab: <0.91 {ISR} (ref 0.00–0.90)

## 2018-06-09 NOTE — Therapy (Signed)
[facility] [place], [place], [address] Phone: [phone]   Fax:  [phone]  Physical Therapy Treatment  Patient Details  Name: [name] [name] MRN: [id number] Date of Birth: 1940-09-03 Referring Provider (PT): Gaynelle Arabian, MD   Encounter Date: 06/09/2018  PT End of Session - 06/09/18 1120    Visit Number  13    Number of Visits  16    Date for PT Re-Evaluation  06/19/18    Authorization Type  FOTO, Progress note every 10th visit; Kx modifier after 15th visit    PT Start Time  1118    PT Stop Time  1215    PT Time Calculation (min)  57 min    Equipment Utilized During Treatment  Gait belt    Activity Tolerance  Patient tolerated treatment well    Behavior During Therapy  [facility] for tasks assessed/performed       Past Medical History:  Diagnosis Date  . CAD (coronary artery disease)   . Colon polyps   . Dyslipidemia   . Herniated disc   . Hiatal hernia   . HTN (hypertension)   . Hyperlipidemia   . Kidney stones     Past Surgical History:  Procedure Laterality Date  . CORONARY ARTERY BYPASS GRAFT  2006   LIMA to LAD, SVG to RCA, SVG to circumflex, SVG to diagonal   . mole removed  2003  . right knee surgery  2008  . TONSILLECTOMY    . TOTAL KNEE ARTHROPLASTY Right 04/27/2018   Procedure: TOTAL KNEE ARTHROPLASTY RIGHT;  Surgeon: Gaynelle Arabian, MD;  Location: [facility];  Service: Orthopedics;  Laterality: Right;  70min    There were no vitals filed for this visit.  Subjective Assessment - 06/09/18 1118    Subjective  Reports that he still has some fatigue with walking. Reports sleeping better last night. COVID 19 screening performed on patient prior to entering building.    Pertinent History  right TKA 04/27/2018; CAD, HTN, herniated lumbar disc, CAD, CABG    Limitations  Standing;Walking;House hold activities    Diagnostic tests  x-ray, MRI    Patient Stated Goals  walk without AD and return to normal.      Currently in Pain?  No/denies         [facility] PT Assessment - 06/09/18 0001      Assessment   Medical Diagnosis  Presence of right artificial knee joint    Referring Provider (PT)  Gaynelle Arabian, MD    Onset Date/Surgical Date  04/27/18    Next MD Visit  07/07/2018    Prior Therapy  no      Precautions   Precaution Comments  no ultrasound      Restrictions   Weight Bearing Restrictions  No      ROM / Strength   AROM / PROM / Strength  AROM      AROM   Overall AROM   Within functional limits for tasks performed    AROM Assessment Site  Knee    Right/Left Knee  Right;Left    Right Knee Extension  5    Right Knee Flexion  123    Left Knee Extension  5                   [facility] Adult PT Treatment/Exercise - 06/09/18 0001      Knee/Hip Exercises: Stretches   ITB Stretch  Right;3 reps;30 seconds  Knee/Hip Exercises: Aerobic   Recumbent Bike  L4 x15 min, seat 9      Knee/Hip Exercises: Machines for Strengthening   Cybex Knee Extension  10# 3x10 reps BLE    Cybex Knee Flexion  30# 3x10 reps    Cybex Leg Press  3 pl, seat 7 x30 reps      Knee/Hip Exercises: Standing   Terminal Knee Extension  Strengthening;Right;2 sets;10 reps;Limitations    Terminal Knee Extension Limitations  Pink XTS    Step Down  Right;2 sets;10 reps;Hand Hold: 2;Step Height: 4"   Heel dot   Functional Squat  20 reps;3 seconds      Knee/Hip Exercises: Supine   Straight Leg Raises  Strengthening;Right;2 sets;10 reps      Modalities   Modalities  Electrical Stimulation;Vasopneumatic      Acupuncturist Location  R knee    Electrical Stimulation Action  IFC    Electrical Stimulation Parameters  80-150 hz x15 min    Electrical Stimulation Goals  Edema      Vasopneumatic   Number Minutes Vasopneumatic   15 minutes    Vasopnuematic Location   Knee    Vasopneumatic Pressure  Medium    Vasopneumatic Temperature   34               PT Short  Term Goals - 05/11/18 1300      PT SHORT TERM GOAL #1   Title  Patient will be independent with HEP provided by hospital.    Time  2    Period  Weeks    Status  Achieved      PT SHORT TERM GOAL #2   Title  Patient will demonstrate 95+ degrees of right knee flexion AROM to improve ability to perform functional tasks.    Time  2    Period  Weeks    Status  Achieved      PT SHORT TERM GOAL #3   Title  Patient will demonstrate 3 degrees or less of right knee extension AROM to improve gait mechanics.     Time  2    Period  Weeks    Status  On-going        PT Long Term Goals - 06/09/18 1202      PT LONG TERM GOAL #1   Title  Patient will be independent with advanced HEP    Time  4    Period  Weeks    Status  Achieved      PT LONG TERM GOAL #2   Title  Patient will demonstrate 120+ degrees of right knee flexion AROM to improve ability to perform functional tasks.     Time  4    Period  Weeks    Status  Achieved      PT LONG TERM GOAL #3   Title  Patient will demonstrate 0 degrees of right knee extension AROM to improve gait mechanics.     Time  4    Period  Weeks    Status  On-going      PT LONG TERM GOAL #4   Title  Patient will negotiate steps with a reciprocating gait pattern with one rail to safely access bedroom.     Time  4    Period  Weeks    Status  On-going      PT LONG TERM GOAL #5   Title  Patient will ambulate community distances without an AD and  right knee pain less than 3/10.    Time  4    Period  Weeks    Status  Achieved            Plan - 06/09/18 1212    Clinical Impression Statement  Patient presented in clinic with no current R knee pain. Patient able to complete all therex strengthening with VCs and demo provided to correct any compensations. ITB stretching completed in clinic today to improve patient's self stretches at home. AROM of R knee measured as 5-123 deg. Increased edema now present primarily surrounding R patella. Normal  modalities response noted following removal of the modalities. Patient encouraged to continue printed HEP and to complete stretches to point that stretch was felt.    Stability/Clinical Decision Making  Stable/Uncomplicated    Rehab Potential  Good    PT Frequency  3x / week    PT Duration  4 weeks    PT Treatment/Interventions  ADLs/Self Care Home Management;Electrical Stimulation;Moist Heat;Cryotherapy;Manual techniques;Dry needling;Passive range of motion;Neuromuscular re-education;Functional mobility training;Stair training;Gait training;Therapeutic activities;Therapeutic exercise;Balance training;Patient/family education;Taping;Vasopneumatic Device    PT Next Visit Plan  Progress to functional strengthening with focus on L knee per MPT POC.    PT Home Exercise Plan  HEP- HS and ITB stretch, prone hang    Consulted and Agree with Plan of Care  Patient       Patient will benefit from skilled therapeutic intervention in order to improve the following deficits and impairments:  Pain, Decreased activity tolerance, Decreased endurance, Decreased range of motion, Decreased strength, Difficulty walking  Visit Diagnosis: Acute pain of right knee  Difficulty in walking, not elsewhere classified  Stiffness of right knee, not elsewhere classified     Problem List Patient Active Problem List   Diagnosis Date Noted  . Osteoarthritis, knee 04/28/2018  . OA (osteoarthritis) of knee 04/27/2018  . Osteoarthritis of knee 05/08/2017  . Tinnitus of both ears 09/10/2016  . Ventral hernia without obstruction or gangrene 03/28/2016  . AAA (abdominal aortic aneurysm) without rupture ([place]) 07/20/2015  . Obstructive sleep apnea 03/17/2014  . Erectile dysfunction due to arterial insufficiency 01/12/2014  . Thrombocytopenia ([place]) 12/30/2013  . Allergic rhinitis 10/01/2012  . BPH (benign prostatic hyperplasia) 10/01/2012  . PVC's (premature ventricular contractions) 09/01/2009  . COLONIC POLYPS  04/14/2009  . Hyperlipidemia LDL goal <70 04/14/2009  . Essential hypertension 04/14/2009  . Coronary atherosclerosis 04/14/2009  . HIATAL HERNIA 04/14/2009  . HERNIATED DISC 04/14/2009    Standley Brooking, PTA 06/09/2018, 12:29 PM  [facility] [address] [place], [place], [address] Phone: [phone]   Fax:  [phone]  Name: [name] [name] MRN: [id number] Date of Birth: [birth year]

## 2018-06-11 ENCOUNTER — Encounter: Payer: Medicare Other | Admitting: Physical Therapy

## 2018-06-12 ENCOUNTER — Ambulatory Visit: Payer: Medicare Other | Admitting: Physical Therapy

## 2018-06-12 ENCOUNTER — Other Ambulatory Visit: Payer: Self-pay

## 2018-06-12 ENCOUNTER — Encounter: Payer: Self-pay | Admitting: Physical Therapy

## 2018-06-12 DIAGNOSIS — M25561 Pain in right knee: Secondary | ICD-10-CM

## 2018-06-12 DIAGNOSIS — M25661 Stiffness of right knee, not elsewhere classified: Secondary | ICD-10-CM | POA: Diagnosis not present

## 2018-06-12 DIAGNOSIS — R262 Difficulty in walking, not elsewhere classified: Secondary | ICD-10-CM | POA: Diagnosis not present

## 2018-06-12 NOTE — Therapy (Signed)
[facility] [place], [place], [address] Phone: [phone]   Fax:  [phone]  Physical Therapy Treatment  Patient Details  Name: [name] [name] MRN: [id number] Date of Birth: 10-05-1940 Referring Provider (PT): Gaynelle Arabian, MD   Encounter Date: 06/12/2018  PT End of Session - 06/12/18 1013    Visit Number  14    Number of Visits  16    Date for PT Re-Evaluation  06/19/18    Authorization Type  FOTO, Progress note every 10th visit; Kx modifier after 15th visit    PT Start Time  1005    PT Stop Time  1056    PT Time Calculation (min)  51 min    Activity Tolerance  Patient tolerated treatment well    Behavior During Therapy  [facility] for tasks assessed/performed       Past Medical History:  Diagnosis Date  . CAD (coronary artery disease)   . Colon polyps   . Dyslipidemia   . Herniated disc   . Hiatal hernia   . HTN (hypertension)   . Hyperlipidemia   . Kidney stones     Past Surgical History:  Procedure Laterality Date  . CORONARY ARTERY BYPASS GRAFT  2006   LIMA to LAD, SVG to RCA, SVG to circumflex, SVG to diagonal   . mole removed  2003  . right knee surgery  2008  . TONSILLECTOMY    . TOTAL KNEE ARTHROPLASTY Right 04/27/2018   Procedure: TOTAL KNEE ARTHROPLASTY RIGHT;  Surgeon: Gaynelle Arabian, MD;  Location: [facility];  Service: Orthopedics;  Laterality: Right;  35min    There were no vitals filed for this visit.  Subjective Assessment - 06/12/18 1008    Subjective  COVID-19 screening performed prior to patient entering the building. Patient states he has not been able to ice his knee this morning.     Pertinent History  right TKA 04/27/2018; CAD, HTN, herniated lumbar disc, CAD, CABG    Limitations  Standing;Walking;House hold activities    Diagnostic tests  x-ray, MRI    Patient Stated Goals  walk without AD and return to normal.     Currently in Pain?  No/denies         [facility] PT Assessment -  06/12/18 0001      Assessment   Medical Diagnosis  Presence of right artificial knee joint    Referring Provider (PT)  Gaynelle Arabian, MD    Onset Date/Surgical Date  04/27/18    Next MD Visit  07/07/2018    Prior Therapy  no      Precautions   Precaution Comments  no ultrasound                   [facility] Adult PT Treatment/Exercise - 06/12/18 0001      Knee/Hip Exercises: Stretches   Passive Hamstring Stretch  Right;3 reps;30 seconds    ITB Stretch  Right;3 reps;30 seconds      Knee/Hip Exercises: Aerobic   Recumbent Bike  L5 x15 min, seat 9      Knee/Hip Exercises: Machines for Strengthening   Cybex Knee Extension  20# 3x10 reps BLE    Cybex Knee Flexion  40# 3x10 reps; 20# single leg eccentric control x10    Cybex Leg Press  3 pl, seat 7 x30 reps      Modalities   Modalities  Psychologist, educational Location  R  knee    Electrical Stimulation Action  IFC    Electrical Stimulation Parameters  80-150 hz x15 mins    Electrical Stimulation Goals  Edema      Vasopneumatic   Number Minutes Vasopneumatic   15 minutes    Vasopnuematic Location   Knee    Vasopneumatic Pressure  Medium    Vasopneumatic Temperature   34               PT Short Term Goals - 05/11/18 1300      PT SHORT TERM GOAL #1   Title  Patient will be independent with HEP provided by hospital.    Time  2    Period  Weeks    Status  Achieved      PT SHORT TERM GOAL #2   Title  Patient will demonstrate 95+ degrees of right knee flexion AROM to improve ability to perform functional tasks.    Time  2    Period  Weeks    Status  Achieved      PT SHORT TERM GOAL #3   Title  Patient will demonstrate 3 degrees or less of right knee extension AROM to improve gait mechanics.     Time  2    Period  Weeks    Status  On-going        PT Long Term Goals - 06/09/18 1202      PT LONG TERM GOAL #1   Title  Patient will be  independent with advanced HEP    Time  4    Period  Weeks    Status  Achieved      PT LONG TERM GOAL #2   Title  Patient will demonstrate 120+ degrees of right knee flexion AROM to improve ability to perform functional tasks.     Time  4    Period  Weeks    Status  Achieved      PT LONG TERM GOAL #3   Title  Patient will demonstrate 0 degrees of right knee extension AROM to improve gait mechanics.     Time  4    Period  Weeks    Status  On-going      PT LONG TERM GOAL #4   Title  Patient will negotiate steps with a reciprocating gait pattern with one rail to safely access bedroom.     Time  4    Period  Weeks    Status  On-going      PT LONG TERM GOAL #5   Title  Patient will ambulate community distances without an AD and right knee pain less than 3/10.    Time  4    Period  Weeks    Status  Achieved            Plan - 06/12/18 1013    Clinical Impression Statement  Patient was able to tolerate progression of treatment well with no reports of increased pain, just muscle fatigue. Patient was able to tolerate ITB stretching with good form. Patient required verbal cue to maintain knee extension during hamstring stretch and to not lift any higher if the knee starts to bend. Normal response to modalities upon removal.     Stability/Clinical Decision Making  Stable/Uncomplicated    Clinical Decision Making  Low    Rehab Potential  Good    PT Frequency  3x / week    PT Duration  4 weeks    PT Treatment/Interventions  ADLs/Self  Care Home Management;Electrical Stimulation;Moist Heat;Cryotherapy;Manual techniques;Dry needling;Passive range of motion;Neuromuscular re-education;Functional mobility training;Stair training;Gait training;Therapeutic activities;Therapeutic exercise;Balance training;Patient/family education;Taping;Vasopneumatic Device    PT Next Visit Plan  Progress to functional strengthening with focus on L knee per MPT POC.    Consulted and Agree with Plan of Care   Patient       Patient will benefit from skilled therapeutic intervention in order to improve the following deficits and impairments:  Pain, Decreased activity tolerance, Decreased endurance, Decreased range of motion, Decreased strength, Difficulty walking  Visit Diagnosis: Acute pain of right knee  Difficulty in walking, not elsewhere classified  Stiffness of right knee, not elsewhere classified     Problem List Patient Active Problem List   Diagnosis Date Noted  . Osteoarthritis, knee 04/28/2018  . OA (osteoarthritis) of knee 04/27/2018  . Osteoarthritis of knee 05/08/2017  . Tinnitus of both ears 09/10/2016  . Ventral hernia without obstruction or gangrene 03/28/2016  . AAA (abdominal aortic aneurysm) without rupture ([place]) 07/20/2015  . Obstructive sleep apnea 03/17/2014  . Erectile dysfunction due to arterial insufficiency 01/12/2014  . Thrombocytopenia ([place]) 12/30/2013  . Allergic rhinitis 10/01/2012  . BPH (benign prostatic hyperplasia) 10/01/2012  . PVC's (premature ventricular contractions) 09/01/2009  . COLONIC POLYPS 04/14/2009  . Hyperlipidemia LDL goal <70 04/14/2009  . Essential hypertension 04/14/2009  . Coronary atherosclerosis 04/14/2009  . HIATAL HERNIA 04/14/2009  . HERNIATED DISC 04/14/2009    Gabriela Eves, PT, DPT 06/12/2018, 11:06 AM  [facility] [address] [place], [place], [address] Phone: [phone]   Fax:  [phone]  Name: [name] [name] MRN: [id number] Date of Birth: [birth year]

## 2018-06-16 ENCOUNTER — Encounter: Payer: Self-pay | Admitting: Physical Therapy

## 2018-06-16 ENCOUNTER — Other Ambulatory Visit: Payer: Self-pay

## 2018-06-16 ENCOUNTER — Ambulatory Visit: Payer: Medicare Other | Admitting: Physical Therapy

## 2018-06-16 DIAGNOSIS — M25561 Pain in right knee: Secondary | ICD-10-CM

## 2018-06-16 DIAGNOSIS — M25661 Stiffness of right knee, not elsewhere classified: Secondary | ICD-10-CM | POA: Diagnosis not present

## 2018-06-16 DIAGNOSIS — R262 Difficulty in walking, not elsewhere classified: Secondary | ICD-10-CM | POA: Diagnosis not present

## 2018-06-16 NOTE — Therapy (Signed)
[facility] [place], [place], [address] Phone: [phone]   Fax:  [phone]  Physical Therapy Treatment  Patient Details  Name: [name] [name] MRN: [id number] Date of Birth: [birth year] Referring Provider (PT): Gaynelle Arabian, MD   Encounter Date: 06/16/2018  PT End of Session - 06/16/18 1120    Visit Number  15    Number of Visits  16    Date for PT Re-Evaluation  06/19/18    Authorization Type  FOTO, Progress note every 10th visit; Kx modifier after 15th visit    PT Start Time  1118    PT Stop Time  1208    PT Time Calculation (min)  50 min    Activity Tolerance  Patient tolerated treatment well    Behavior During Therapy  [facility] for tasks assessed/performed       Past Medical History:  Diagnosis Date  . CAD (coronary artery disease)   . Colon polyps   . Dyslipidemia   . Herniated disc   . Hiatal hernia   . HTN (hypertension)   . Hyperlipidemia   . Kidney stones     Past Surgical History:  Procedure Laterality Date  . CORONARY ARTERY BYPASS GRAFT  2006   LIMA to LAD, SVG to RCA, SVG to circumflex, SVG to diagonal   . mole removed  2003  . right knee surgery  2008  . TONSILLECTOMY    . TOTAL KNEE ARTHROPLASTY Right 04/27/2018   Procedure: TOTAL KNEE ARTHROPLASTY RIGHT;  Surgeon: Gaynelle Arabian, MD;  Location: [facility];  Service: Orthopedics;  Laterality: Right;  37min    There were no vitals filed for this visit.  Subjective Assessment - 06/16/18 1119    Subjective  COVID-19 screening performed prior to patient entering the building. Patient states he is more stiff today but has not walked this morning.    Pertinent History  right TKA 04/27/2018; CAD, HTN, herniated lumbar disc, CAD, CABG    Limitations  Standing;Walking;House hold activities    Diagnostic tests  x-ray, MRI    Patient Stated Goals  walk without AD and return to normal.     Currently in Pain?  No/denies         [facility] PT Assessment -  06/16/18 0001      Assessment   Medical Diagnosis  Presence of right artificial knee joint    Referring Provider (PT)  Gaynelle Arabian, MD    Onset Date/Surgical Date  04/27/18    Next MD Visit  07/07/2018    Prior Therapy  no      Precautions   Precaution Comments  no ultrasound                   [facility] Adult PT Treatment/Exercise - 06/16/18 0001      Knee/Hip Exercises: Aerobic   Recumbent Bike  L4-5 x12 min      Knee/Hip Exercises: Machines for Strengthening   Cybex Knee Extension  20# 3x10 reps BLE    Cybex Knee Flexion  50# 3x10 reps    Cybex Leg Press  3 pl, seat 7 x30 reps with ball squeeze    Total Gym Leg Press         Knee/Hip Exercises: Standing   Terminal Knee Extension  Strengthening;Right;2 sets;10 reps;Limitations    Terminal Knee Extension Limitations  Orange XTS      Knee/Hip Exercises: Supine   Straight Leg Raises  Strengthening;Right;2 sets;10 reps  Modalities   Modalities  Vasopneumatic      Vasopneumatic   Number Minutes Vasopneumatic   15 minutes    Vasopnuematic Location   Knee    Vasopneumatic Pressure  Medium    Vasopneumatic Temperature   34               PT Short Term Goals - 05/11/18 1300      PT SHORT TERM GOAL #1   Title  Patient will be independent with HEP provided by hospital.    Time  2    Period  Weeks    Status  Achieved      PT SHORT TERM GOAL #2   Title  Patient will demonstrate 95+ degrees of right knee flexion AROM to improve ability to perform functional tasks.    Time  2    Period  Weeks    Status  Achieved      PT SHORT TERM GOAL #3   Title  Patient will demonstrate 3 degrees or less of right knee extension AROM to improve gait mechanics.     Time  2    Period  Weeks    Status  On-going        PT Long Term Goals - 06/09/18 1202      PT LONG TERM GOAL #1   Title  Patient will be independent with advanced HEP    Time  4    Period  Weeks    Status  Achieved      PT LONG TERM GOAL #2    Title  Patient will demonstrate 120+ degrees of right knee flexion AROM to improve ability to perform functional tasks.     Time  4    Period  Weeks    Status  Achieved      PT LONG TERM GOAL #3   Title  Patient will demonstrate 0 degrees of right knee extension AROM to improve gait mechanics.     Time  4    Period  Weeks    Status  On-going      PT LONG TERM GOAL #4   Title  Patient will negotiate steps with a reciprocating gait pattern with one rail to safely access bedroom.     Time  4    Period  Weeks    Status  On-going      PT LONG TERM GOAL #5   Title  Patient will ambulate community distances without an AD and right knee pain less than 3/10.    Time  4    Period  Weeks    Status  Achieved            Plan - 06/16/18 1202    Clinical Impression Statement  Patient presented in clinic with progression of resistance utilized today. No complaints of pain reported only reporting stiffness with activity. Patient compliant with hip stretching at home with reduction of R hip discomfort reported. Good squat technique noted with equal WB. Normal vasopneumatic response noted following removal of the modality.    Stability/Clinical Decision Making  Stable/Uncomplicated    PT Frequency  3x / week    PT Duration  4 weeks    PT Treatment/Interventions  ADLs/Self Care Home Management;Electrical Stimulation;Moist Heat;Cryotherapy;Manual techniques;Dry needling;Passive range of motion;Neuromuscular re-education;Functional mobility training;Stair training;Gait training;Therapeutic activities;Therapeutic exercise;Balance training;Patient/family education;Taping;Vasopneumatic Device    PT Next Visit Plan  Progress to functional strengthening with focus on L knee per MPT POC.    PT Home  Exercise Plan  HEP- HS and ITB stretch, prone hang    Consulted and Agree with Plan of Care  Patient       Patient will benefit from skilled therapeutic intervention in order to improve the following  deficits and impairments:  Pain, Decreased activity tolerance, Decreased endurance, Decreased range of motion, Decreased strength, Difficulty walking  Visit Diagnosis: Acute pain of right knee  Difficulty in walking, not elsewhere classified  Stiffness of right knee, not elsewhere classified     Problem List Patient Active Problem List   Diagnosis Date Noted  . Osteoarthritis, knee 04/28/2018  . OA (osteoarthritis) of knee 04/27/2018  . Osteoarthritis of knee 05/08/2017  . Tinnitus of both ears 09/10/2016  . Ventral hernia without obstruction or gangrene 03/28/2016  . AAA (abdominal aortic aneurysm) without rupture ([place]) 07/20/2015  . Obstructive sleep apnea 03/17/2014  . Erectile dysfunction due to arterial insufficiency 01/12/2014  . Thrombocytopenia ([place]) 12/30/2013  . Allergic rhinitis 10/01/2012  . BPH (benign prostatic hyperplasia) 10/01/2012  . PVC's (premature ventricular contractions) 09/01/2009  . COLONIC POLYPS 04/14/2009  . Hyperlipidemia LDL goal <70 04/14/2009  . Essential hypertension 04/14/2009  . Coronary atherosclerosis 04/14/2009  . HIATAL HERNIA 04/14/2009  . HERNIATED DISC 04/14/2009    Standley Brooking, PTA 06/16/2018, 12:24 PM  [facility] [address] [place], [place], [address] Phone: [phone]   Fax:  [phone]  Name: [name] [name] MRN: [id number] Date of Birth: 1940-04-17

## 2018-06-18 ENCOUNTER — Other Ambulatory Visit: Payer: Self-pay

## 2018-06-18 ENCOUNTER — Encounter: Payer: Self-pay | Admitting: Physical Therapy

## 2018-06-18 ENCOUNTER — Ambulatory Visit: Payer: Medicare Other | Admitting: Physical Therapy

## 2018-06-18 DIAGNOSIS — M25661 Stiffness of right knee, not elsewhere classified: Secondary | ICD-10-CM | POA: Diagnosis not present

## 2018-06-18 DIAGNOSIS — R262 Difficulty in walking, not elsewhere classified: Secondary | ICD-10-CM

## 2018-06-18 DIAGNOSIS — M25561 Pain in right knee: Secondary | ICD-10-CM | POA: Diagnosis not present

## 2018-06-18 NOTE — Therapy (Signed)
[facility] [place], [place], [address] Phone: [phone]   Fax:  [phone]  Physical Therapy Treatment  PHYSICAL THERAPY DISCHARGE SUMMARY  Visits from Start of Care: 15  Current functional level related to goals / functional outcomes: See below   Remaining deficits: See goals   Education / Equipment: HEP Plan: Patient agrees to discharge.  Patient goals were partially met. Patient is being discharged due to being pleased with the current functional level.  ?????    Gabriela Eves, PT, DPT 06/18/18    Patient Details  Name: [name] [name] MRN: [id number] Date of Birth: 1940/09/06 Referring Provider (PT): Gaynelle Arabian, MD   Encounter Date: 06/18/2018  PT End of Session - 06/18/18 1133    Visit Number  15    Number of Visits  16    Date for PT Re-Evaluation  06/19/18    Authorization Type  FOTO, Progress note every 10th visit; Kx modifier after 15th visit    PT Start Time  1118    PT Stop Time  1217    PT Time Calculation (min)  59 min    Activity Tolerance  Patient tolerated treatment well    Behavior During Therapy  [facility] for tasks assessed/performed       Past Medical History:  Diagnosis Date  . CAD (coronary artery disease)   . Colon polyps   . Dyslipidemia   . Herniated disc   . Hiatal hernia   . HTN (hypertension)   . Hyperlipidemia   . Kidney stones     Past Surgical History:  Procedure Laterality Date  . CORONARY ARTERY BYPASS GRAFT  2006   LIMA to LAD, SVG to RCA, SVG to circumflex, SVG to diagonal   . mole removed  2003  . right knee surgery  2008  . TONSILLECTOMY    . TOTAL KNEE ARTHROPLASTY Right 04/27/2018   Procedure: TOTAL KNEE ARTHROPLASTY RIGHT;  Surgeon: Gaynelle Arabian, MD;  Location: [facility];  Service: Orthopedics;  Laterality: Right;  59mn    There were no vitals filed for this visit.  Subjective Assessment - 06/18/18 1131    Subjective  COVID-19 screening  performed prior to patient entering the building. Patient reports he still has some soreness when his knee touches the other knee while sleeping.    Pertinent History  right TKA 04/27/2018; CAD, HTN, herniated lumbar disc, CAD, CABG    Limitations  Standing;Walking;House hold activities    Diagnostic tests  x-ray, MRI    Patient Stated Goals  walk without AD and return to normal.          [facility]18 0001      Assessment   Medical Diagnosis  Presence of right artificial knee joint    Referring Provider (PT)  FGaynelle Arabian MD    Onset Date/Surgical Date  04/27/18    Next MD Visit  07/07/2018    Prior Therapy  no      Precautions   Precaution Comments  no ultrasound      ROM / Strength   AROM / PROM / Strength  AROM      AROM   Overall AROM   Within functional limits for tasks performed    AROM Assessment Site  Knee    Right/Left Knee  Right    Right Knee Extension  4    Right Knee Flexion  130  [facility] Treatment/Exercise - 06/18/18 0001      Ambulation/Gait   Stairs  Yes    Stairs Assistance  6: Modified independent (Device/Increase time)    Stair Management Technique  One rail Left;Alternating pattern;Forwards    Number of Stairs  4   x2 RT   Height of Stairs  6.5      Knee/Hip Exercises: Aerobic   Recumbent Bike  L5 x16 min      Knee/Hip Exercises: Machines for Strengthening   Cybex Knee Extension  20# 3x10 reps BLE    Cybex Knee Flexion  50# 3x10 reps    Cybex Leg Press  3 pl, seat 7 x30 reps with 4# ball squeeze      Knee/Hip Exercises: Standing   Terminal Knee Extension  Strengthening;Right;2 sets;10 reps;Limitations    Terminal Knee Extension Limitations  Orange XTS      Modalities   Modalities  Vasopneumatic      Vasopneumatic   Number Minutes Vasopneumatic   15 minutes    Vasopnuematic Location   Knee    Vasopneumatic Pressure  Medium    Vasopneumatic Temperature   64               PT  Short Term Goals - 05/11/18 1300      PT SHORT TERM GOAL #1   Title  Patient will be independent with HEP provided by hospital.    Time  2    Period  Weeks    Status  Achieved      PT SHORT TERM GOAL #2   Title  Patient will demonstrate 95+ degrees of right knee flexion AROM to improve ability to perform functional tasks.    Time  2    Period  Weeks    Status  Achieved      PT SHORT TERM GOAL #3   Title  Patient will demonstrate 3 degrees or less of right knee extension AROM to improve gait mechanics.     Time  2    Period  Weeks    Status  On-going        PT Long Term Goals - 06/18/18 1235      PT LONG TERM GOAL #1   Title  Patient will be independent with advanced HEP    Time  4    Period  Weeks    Status  Achieved      PT LONG TERM GOAL #2   Title  Patient will demonstrate 120+ degrees of right knee flexion AROM to improve ability to perform functional tasks.     Time  4    Period  Weeks    Status  Achieved      PT LONG TERM GOAL #3   Title  Patient will demonstrate 0 degrees of right knee extension AROM to improve gait mechanics.     Time  4    Period  Weeks    Status  Not Met      PT LONG TERM GOAL #4   Title  Patient will negotiate steps with a reciprocating gait pattern with one rail to safely access bedroom.     Time  4    Period  Weeks    Status  Partially Met      PT LONG TERM GOAL #5   Title  Patient will ambulate community distances without an AD and right knee pain less than 3/10.    Time  4    Period  Weeks    Status  Achieved            Plan - 06/18/18 1236    Clinical Impression Statement  Patient progressed very well in clinic following R TKR. Patient able to be progressed via strengthening without complaint of pain. Patient's AROM of R knee measured as 4-130 deg today. Patient continues to demonstrate hesitancy and fear with stairs although able to complete reciprically with slower speed. Able to complete most LTGs set in evaluation.  Normal vasopnuematic response noted following removal of the modality.    Stability/Clinical Decision Making  Stable/Uncomplicated    Rehab Potential  Good    PT Frequency  3x / week    PT Duration  4 weeks    PT Treatment/Interventions  ADLs/Self Care Home Management;Electrical Stimulation;Moist Heat;Cryotherapy;Manual techniques;Dry needling;Passive range of motion;Neuromuscular re-education;Functional mobility training;Stair training;Gait training;Therapeutic activities;Therapeutic exercise;Balance training;Patient/family education;Taping;Vasopneumatic Device    PT Next Visit Plan  D/C summary required.    PT Home Exercise Plan  HEP- HS and ITB stretch, prone hang    Consulted and Agree with Plan of Care  Patient       Patient will benefit from skilled therapeutic intervention in order to improve the following deficits and impairments:  Pain, Decreased activity tolerance, Decreased endurance, Decreased range of motion, Decreased strength, Difficulty walking  Visit Diagnosis: Acute pain of right knee  Difficulty in walking, not elsewhere classified  Stiffness of right knee, not elsewhere classified     Problem List Patient Active Problem List   Diagnosis Date Noted  . Osteoarthritis, knee 04/28/2018  . OA (osteoarthritis) of knee 04/27/2018  . Osteoarthritis of knee 05/08/2017  . Tinnitus of both ears 09/10/2016  . Ventral hernia without obstruction or gangrene 03/28/2016  . AAA (abdominal aortic aneurysm) without rupture ([place]) 07/20/2015  . Obstructive sleep apnea 03/17/2014  . Erectile dysfunction due to arterial insufficiency 01/12/2014  . Thrombocytopenia ([place]) 12/30/2013  . Allergic rhinitis 10/01/2012  . BPH (benign prostatic hyperplasia) 10/01/2012  . PVC's (premature ventricular contractions) 09/01/2009  . COLONIC POLYPS 04/14/2009  . Hyperlipidemia LDL goal <70 04/14/2009  . Essential hypertension 04/14/2009  . Coronary atherosclerosis 04/14/2009  . HIATAL HERNIA  04/14/2009  . HERNIATED DISC 04/14/2009    Standley Brooking 06/18/2018, 12:53 PM  [facility] [address] [place], [place], [address] Phone: [phone]   Fax:  [phone]  Name: [name] [name] MRN: [id number] Date of Birth: [birth year]

## 2018-06-25 ENCOUNTER — Encounter: Payer: Self-pay | Admitting: Family Medicine

## 2018-06-25 ENCOUNTER — Ambulatory Visit (INDEPENDENT_AMBULATORY_CARE_PROVIDER_SITE_OTHER): Payer: Medicare Other | Admitting: Family Medicine

## 2018-06-25 ENCOUNTER — Other Ambulatory Visit: Payer: Self-pay

## 2018-06-25 DIAGNOSIS — W57XXXA Bitten or stung by nonvenomous insect and other nonvenomous arthropods, initial encounter: Secondary | ICD-10-CM | POA: Diagnosis not present

## 2018-06-25 DIAGNOSIS — S90561A Insect bite (nonvenomous), right ankle, initial encounter: Secondary | ICD-10-CM

## 2018-06-25 NOTE — Progress Notes (Signed)
Virtual Visit via telephone Note Due to COVID-19, visit is conducted virtually and was requested by patient. This visit type was conducted due to national recommendations for restrictions regarding the COVID-19 Pandemic (e.g. social distancing) in an effort to limit this patient's exposure and mitigate transmission in our community.  Due to her co-morbid illnesses, this patient is at least at moderate risk for complications without adequate follow up.  This format is felt to be most appropriate for this patient at this time.  All issues noted in this document were discussed and addressed.  A physical exam was not performed with this format.   I connected with [name] [name] on 06/25/18 at 1355 by telephone and verified that I am speaking with the correct person using two identifiers. Claxton Levitz Crespi is currently located at home and family is currently with them during visit. The provider, Monia Pouch, FNP is located in their office at time of visit.  I discussed the limitations, risks, security and privacy concerns of performing an evaluation and management service by telephone and the availability of in person appointments. I also discussed with the patient that there may be a patient responsible charge related to this service. The patient expressed understanding and agreed to proceed.  Subjective:  Patient ID: [name] [name], male    DOB: 06/01/1940, 78 y.o.   MRN: [id number]  Chief Complaint:  Insect Bite (tick bite)   HPI: [name] [name] is a 78 y.o. male presenting on 06/25/2018 for Insect Bite (tick bite)   Pt reports he removed a tiny tick from his ankle on Monday. States he now has a slightly pruritic knot where the tick was. He denies surrounding erythema or rash. No drainage. No arthralgias, fever, chills, myalgias, malaise, confusion, or weakness. He states he has used witch hazel to help with the itching. No other associated symptoms. He is just concerned because  the tick was so small. He wanted to make sure he did not need to do anything else.   Relevant past medical, surgical, family, and social history reviewed and updated as indicated.  Allergies and medications reviewed and updated.   Past Medical History:  Diagnosis Date  . CAD (coronary artery disease)   . Colon polyps   . Dyslipidemia   . Herniated disc   . Hiatal hernia   . HTN (hypertension)   . Hyperlipidemia   . Kidney stones     Past Surgical History:  Procedure Laterality Date  . CORONARY ARTERY BYPASS GRAFT  2006   LIMA to LAD, SVG to RCA, SVG to circumflex, SVG to diagonal   . mole removed  2003  . right knee surgery  2008  . TONSILLECTOMY    . TOTAL KNEE ARTHROPLASTY Right 04/27/2018   Procedure: TOTAL KNEE ARTHROPLASTY RIGHT;  Surgeon: Gaynelle Arabian, MD;  Location: [facility];  Service: Orthopedics;  Laterality: Right;  50min    Social History   Socioeconomic History  . Marital status: Married    Spouse name: Not on file  . Number of children: 2  . Years of education: Not on file  . Highest education level: Bachelor's degree (e.g., BA, AB, BS)  Occupational History  . Occupation: Armed forces technical officer for [employer]  . Financial resource strain: Not hard at all  . Food insecurity:    Worry: Never true    Inability: Never true  . Transportation needs:    Medical: No    Non-medical: No  Tobacco Use  .  Smoking status: Never Smoker  . Smokeless tobacco: Never Used  . Tobacco comment: does not smoke   Substance and Sexual Activity  . Alcohol use: Yes    Alcohol/week: 1.0 standard drinks    Types: 1 Glasses of wine per week    Comment: with meal  . Drug use: No  . Sexual activity: Yes  Lifestyle  . Physical activity:    Days per week: 3 days    Minutes per session: 60 min  . Stress: Not at all  Relationships  . Social connections:    Talks on phone: More than three times a week    Gets together: More than three times a week     Attends religious service: More than 4 times per year    Active member of club or organization: Yes    Attends meetings of clubs or organizations: More than 4 times per year    Relationship status: Married  . Intimate partner violence:    Fear of current or ex partner: No    Emotionally abused: No    Physically abused: No    Forced sexual activity: No  Other Topics Concern  . Not on file  Social History Narrative   Married 37+ years, 2 adult children and one granddaughter. Engineer, building services at [employer].     Outpatient Encounter Medications as of 06/25/2018  Medication Sig  . aspirin EC 81 MG tablet Take 81 mg by mouth daily.  . Evolocumab (REPATHA) 140 MG/ML SOSY Inject 140 mg into the skin every 14 (fourteen) days.  . fluticasone (FLONASE) 50 MCG/ACT nasal spray Place 2 sprays into both nostrils daily as needed for allergies or rhinitis.  . metoprolol tartrate (LOPRESSOR) 25 MG tablet TAKE 1 TABLET IN THE MORNING AND 2 TABLETS IN THE EVENING (Patient taking differently: Take 25 mg by mouth See admin instructions. Take 25 mg by mouth in the morning and in the evening, may take additional 25 mg at bedtime if needed for palpitations)  . nitroGLYCERIN (NITROSTAT) 0.4 MG SL tablet Place 1 tablet (0.4 mg total) under the tongue every 5 (five) minutes as needed for chest pain. (Patient not taking: Reported on 05/22/2018)  . omeprazole (PRILOSEC) 20 MG capsule Take 20 mg by mouth daily.  . [DISCONTINUED] oxyCODONE (OXY IR/ROXICODONE) 5 MG immediate release tablet Take 1-2 tablets (5-10 mg total) by mouth every 6 (six) hours as needed for severe pain. (Patient not taking: Reported on 05/22/2018)   No facility-administered encounter medications on file as of 06/25/2018.     Allergies  Allergen Reactions  . Levofloxacin     Caused C- Diff  . Crestor [Rosuvastatin] Other (See Comments)    Liver problems  . Vytorin [Ezetimibe-Simvastatin] Other (See Comments)    Liver problems    Review of  Systems  Constitutional: Negative for activity change, chills, fatigue and fever.  Respiratory: Negative for cough and shortness of breath.   Cardiovascular: Negative for chest pain and palpitations.  Gastrointestinal: Negative for abdominal pain, nausea and vomiting.  Musculoskeletal: Negative for arthralgias and myalgias.  Skin: Negative for color change, pallor and rash.  Neurological: Negative for weakness and headaches.  Psychiatric/Behavioral: Negative for confusion.  All other systems reviewed and are negative.        Observations/Objective: No vital signs or physical exam, this was a telephone or virtual health encounter.  Pt alert and oriented, answers all questions appropriately, and able to speak in full sentences.    Assessment and Plan: Percell Miller  was seen today for insect bite.  Diagnoses and all orders for this visit:  Tick bite, initial encounter No associated symptoms. No rash fever, chills, arthralgias, fatigue, or malaise. Pt aware to report any new or worsening symptoms. Symptomatic care discussed.     Follow Up Instructions: Return if symptoms worsen or fail to improve.    I discussed the assessment and treatment plan with the patient. The patient was provided an opportunity to ask questions and all were answered. The patient agreed with the plan and demonstrated an understanding of the instructions.   The patient was advised to call back or seek an in-person evaluation if the symptoms worsen or if the condition fails to improve as anticipated.  The above assessment and management plan was discussed with the patient. The patient verbalized understanding of and has agreed to the management plan. Patient is aware to call the clinic if symptoms persist or worsen. Patient is aware when to return to the clinic for a follow-up visit. Patient educated on when it is appropriate to go to the emergency department.    I provided 15 minutes of non-face-to-face time  during this encounter. The call started at 1355. The call ended at 1410.   Monia Pouch, FNP-C [facility] [address] [place],  [address] [phone]

## 2018-06-28 IMAGING — DX DG CHEST 2V
2 series · 2 of 2 positions shown · non-contrast
Comparison: November 23, 2015

CLINICAL DATA: Hypertension

EXAM:
CHEST  2 VIEW

[chest pa]
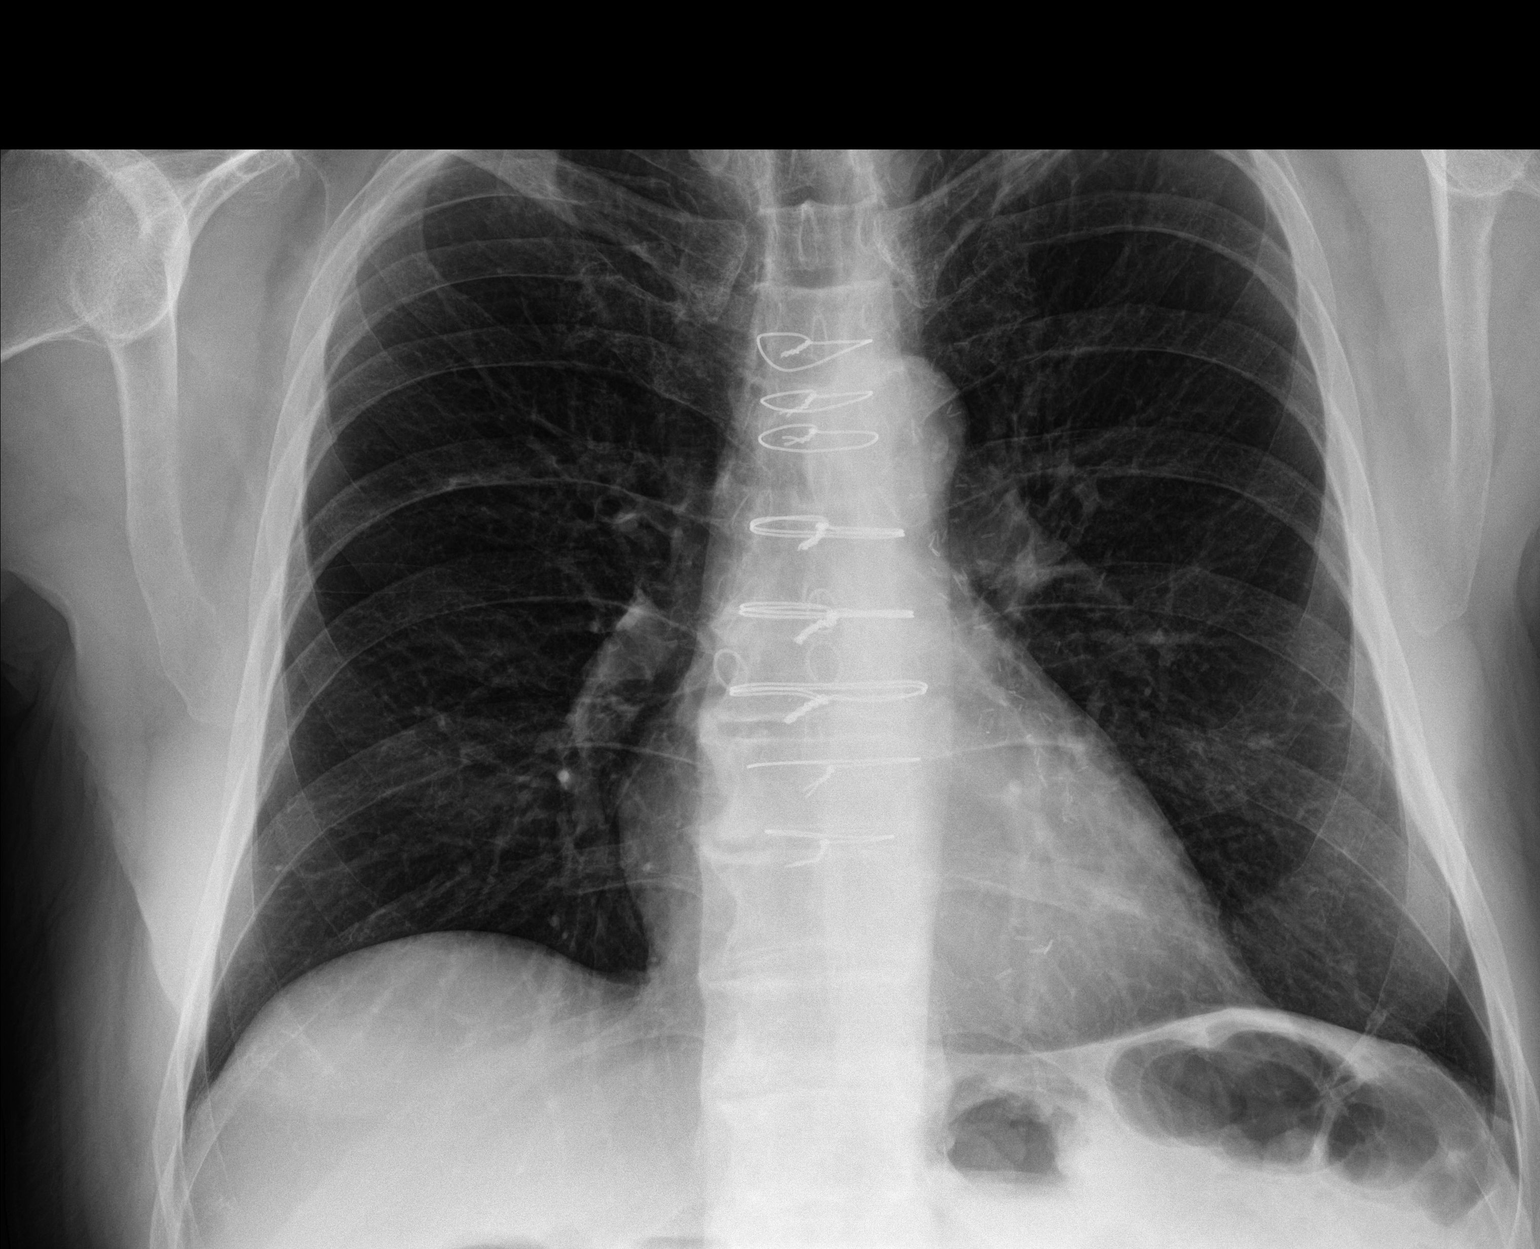

[chest lat]
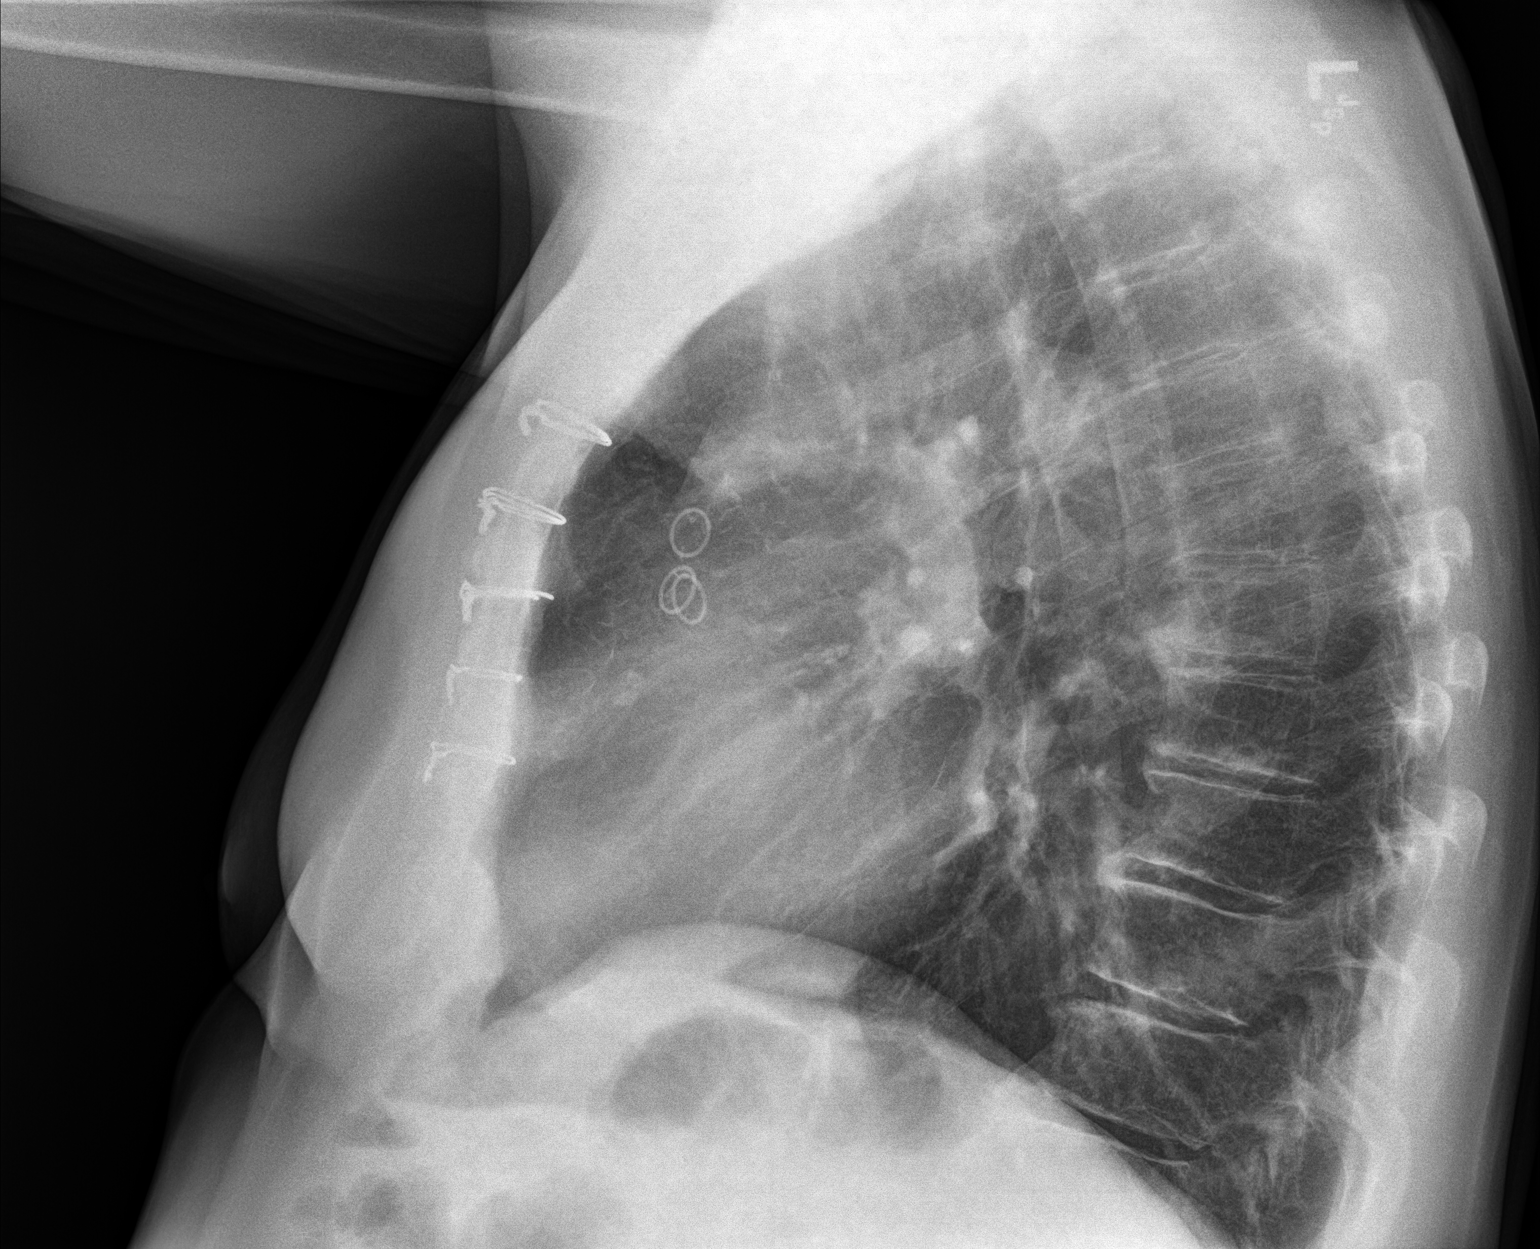

[2 of 2 positions shown; findings below may reference images not displayed]

FINDINGS: There is no edema or consolidation. There is a probable small
granuloma in the left lower lobe, stable. Heart size and pulmonary
vascularity are normal. Patient is status post coronary artery
bypass grafting. No adenopathy. There is degenerative change in the
mid and lower thoracic spine.
IMPRESSION: Probable small granuloma left lower lobe. No edema or consolidation.
Stable cardiac silhouette. No evident adenopathy.

## 2018-07-01 ENCOUNTER — Telehealth: Payer: Self-pay | Admitting: Family Medicine

## 2018-07-02 MED ORDER — DOXYCYCLINE HYCLATE 100 MG PO TABS: 100.0000 mg | ORAL_TABLET | Freq: Two times a day (BID) | ORAL | 0 refills | Status: DC

## 2018-07-02 NOTE — Telephone Encounter (Signed)
Pt called and states that he is feeling very Stiff and no fever, sort of achy in joints.   Pulled tick off Monday a week ago. Did visit with [facility] and didn't get meds.  This is 3rd tick for the season.  Has not had any DOXY this season yet.     [employer].

## 2018-07-02 NOTE — Telephone Encounter (Signed)
Pt aware and med sent in  

## 2018-07-02 NOTE — Telephone Encounter (Signed)
Go ahead and treat with doxycycline 100 mg twice daily with food for 3 weeks

## 2018-07-13 ENCOUNTER — Telehealth: Payer: Self-pay | Admitting: Family Medicine

## 2018-07-13 NOTE — Telephone Encounter (Signed)
Aware of labs - not due until 08/23/18.

## 2018-07-17 ENCOUNTER — Ambulatory Visit (INDEPENDENT_AMBULATORY_CARE_PROVIDER_SITE_OTHER): Payer: Medicare Other | Admitting: Family Medicine

## 2018-07-17 ENCOUNTER — Other Ambulatory Visit: Payer: Self-pay

## 2018-07-17 ENCOUNTER — Encounter: Payer: Self-pay | Admitting: Family Medicine

## 2018-07-17 DIAGNOSIS — N5201 Erectile dysfunction due to arterial insufficiency: Secondary | ICD-10-CM

## 2018-07-17 DIAGNOSIS — D696 Thrombocytopenia, unspecified: Secondary | ICD-10-CM

## 2018-07-17 DIAGNOSIS — M5440 Lumbago with sciatica, unspecified side: Secondary | ICD-10-CM | POA: Diagnosis not present

## 2018-07-17 DIAGNOSIS — E559 Vitamin D deficiency, unspecified: Secondary | ICD-10-CM

## 2018-07-17 DIAGNOSIS — W57XXXA Bitten or stung by nonvenomous insect and other nonvenomous arthropods, initial encounter: Secondary | ICD-10-CM

## 2018-07-17 DIAGNOSIS — I2510 Atherosclerotic heart disease of native coronary artery without angina pectoris: Secondary | ICD-10-CM

## 2018-07-17 DIAGNOSIS — R29898 Other symptoms and signs involving the musculoskeletal system: Secondary | ICD-10-CM | POA: Diagnosis not present

## 2018-07-17 DIAGNOSIS — I10 Essential (primary) hypertension: Secondary | ICD-10-CM | POA: Diagnosis not present

## 2018-07-17 DIAGNOSIS — E785 Hyperlipidemia, unspecified: Secondary | ICD-10-CM

## 2018-07-17 DIAGNOSIS — R5383 Other fatigue: Secondary | ICD-10-CM | POA: Diagnosis not present

## 2018-07-17 DIAGNOSIS — I714 Abdominal aortic aneurysm, without rupture, unspecified: Secondary | ICD-10-CM

## 2018-07-17 DIAGNOSIS — Z8052 Family history of malignant neoplasm of bladder: Secondary | ICD-10-CM

## 2018-07-17 DIAGNOSIS — S30861A Insect bite (nonvenomous) of abdominal wall, initial encounter: Secondary | ICD-10-CM | POA: Diagnosis not present

## 2018-07-17 MED ORDER — NITROGLYCERIN 0.4 MG SL SUBL: 0.4000 mg | SUBLINGUAL_TABLET | SUBLINGUAL | 99 refills | Status: DC | PRN

## 2018-07-17 NOTE — Patient Instructions (Addendum)
Stay active physically and drink plenty of fluids Continue to be careful and practice good hand and pulmonary hygiene Get lab work about mid July timeframe plus urinalysis Follow-up with cardiology as planned Follow-up with urology as planned Keep regular eye checks Follow-up with orthopedist as planned Please use deep [place] off or [place] tick spray.  Do prevention to keep from getting tick bites Have the yard sprayed if necessary We will refill nitroglycerin Align, a probiotic would be good to take because of the loose stools with the antibiotic

## 2018-07-17 NOTE — Addendum Note (Signed)
Addended by: Zannie Cove on: 07/17/2018 02:46 PM   Modules accepted: Orders

## 2018-07-17 NOTE — Addendum Note (Signed)
Addended by: Zannie Cove on: 07/17/2018 02:50 PM   Modules accepted: Orders

## 2018-07-17 NOTE — Progress Notes (Signed)
Virtual Visit Via telephone Note I connected with@ on 07/17/18 by telephone and verified that I am speaking with the correct person or authorized healthcare agent using two identifiers. [name] [name] is currently located at home and there are no unauthorized people in close proximity. I completed this visit while in a private location in my home .  This visit type was conducted due to national recommendations for restrictions regarding the COVID-19 Pandemic (e.g. social distancing).  This format is felt to be most appropriate for this patient at this time.  All issues noted in this document were discussed and addressed.  No physical exam was performed.    I discussed the limitations, risks, security and privacy concerns of performing an evaluation and management service by telephone and the availability of in person appointments. I also discussed with the patient that there may be a patient responsible charge related to this service. The patient expressed understanding and agreed to proceed.   Date:  07/17/2018    ID:  [name] [name]      [birth year]        [id number]   Patient Care Team Patient Care Team: Chipper Herb, MD as PCP - General (Family Medicine) Minus Breeding, MD as PCP - Cardiology (Cardiology) Rutherford Guys, MD as Consulting Physician (Ophthalmology) Clarene Essex, MD as Consulting Physician (Gastroenterology)  Reason for Visit: Primary Care Follow-up     History of Present Illness & Review of Systems:     [name] [name] is a 78 y.o. year old male primary care patient that presents today for a telehealth visit.  I spoke with the patient by telephone he was alert and cooperative.  The family history is positive in his mother for Alzheimer's disease stroke and hypertension.  His father had bladder cancer and heart disease and heart attack.  This patient had his CABG done in 2006.  He has been a good patient and his try to do the best he can to keep his  cholesterol numbers under good control.  He stays active physically takes a statin drug and in addition is using Repatha.  The patient is doing well overall.  He is recently completed and had a right knee replacement and is recovering well from that.  He is also had a tick bite and actually since starting on doxycycline has had a couple more tick bites.  He is still taking doxycycline twice daily and has about 6 more days worth of this medicine to take.  He is having some occasional loose bowel movements.  I told him to please be careful with the medicine and make sure he takes it with food and does not take any caffeine with this.  He will also as result of the loose stools get started on some align.  I also told him because of all the tick bites that he needed to do more preventative care with deep [place] off or [place] tick spray.  The patient today denies any chest pain or shortness of breath anymore than usual.  He has no trouble with his intestinal tract other than the loose bowel movements associated with taking the doxycycline.  There are only a couple of loose stools a day and not profuse in nature he only has 6 more days worth of medicine.  He is passing his water well and has seen the urologist about his erectile dysfunction.  He sees Dr. Percival Spanish the cardiologist, once yearly in January.  He sees  the orthopedic surgeon in follow-up again and will see him again soon.  Review of systems as stated, otherwise negative.  The patient does not have symptoms concerning for COVID-19 infection (fever, chills, cough, or new shortness of breath).      Current Medications (Verified) Allergies as of 07/17/2018      Reactions   Levofloxacin    Caused C- Diff   Crestor [rosuvastatin] Other (See Comments)   Liver problems   Vytorin [ezetimibe-simvastatin] Other (See Comments)   Liver problems      Medication List       Accurate as of Jul 17, 2018  1:26 PM. If you have any questions, ask your nurse or  doctor.        aspirin EC 81 MG tablet Take 81 mg by mouth daily.   doxycycline 100 MG tablet Commonly known as:  VIBRA-TABS Take 1 tablet (100 mg total) by mouth 2 (two) times daily. 1 po bid   Evolocumab 140 MG/ML Sosy Commonly known as:  Repatha Inject 140 mg into the skin every 14 (fourteen) days.   fluticasone 50 MCG/ACT nasal spray Commonly known as:  FLONASE Place 2 sprays into both nostrils daily as needed for allergies or rhinitis.   metoprolol tartrate 25 MG tablet Commonly known as:  LOPRESSOR TAKE 1 TABLET IN THE MORNING AND 2 TABLETS IN THE EVENING What changed:  See the new instructions.   nitroGLYCERIN 0.4 MG SL tablet Commonly known as:  NITROSTAT Place 1 tablet (0.4 mg total) under the tongue every 5 (five) minutes as needed for chest pain.   omeprazole 20 MG capsule Commonly known as:  PRILOSEC Take 20 mg by mouth daily.           Allergies (Verified)    Levofloxacin; Crestor [rosuvastatin]; and Vytorin [ezetimibe-simvastatin]  Past Medical History Past Medical History:  Diagnosis Date  . CAD (coronary artery disease)   . Colon polyps   . Dyslipidemia   . Herniated disc   . Hiatal hernia   . HTN (hypertension)   . Hyperlipidemia   . Kidney stones      Past Surgical History:  Procedure Laterality Date  . CORONARY ARTERY BYPASS GRAFT  2006   LIMA to LAD, SVG to RCA, SVG to circumflex, SVG to diagonal   . mole removed  2003  . right knee surgery  2008  . TONSILLECTOMY    . TOTAL KNEE ARTHROPLASTY Right 04/27/2018   Procedure: TOTAL KNEE ARTHROPLASTY RIGHT;  Surgeon: Gaynelle Arabian, MD;  Location: [facility];  Service: Orthopedics;  Laterality: Right;  69min    Social History   Socioeconomic History  . Marital status: Married    Spouse name: Not on file  . Number of children: 2  . Years of education: Not on file  . Highest education level: Bachelor's degree (e.g., BA, AB, BS)  Occupational History  . Occupation: Armed forces technical officer  for [employer]  . Financial resource strain: Not hard at all  . Food insecurity:    Worry: Never true    Inability: Never true  . Transportation needs:    Medical: No    Non-medical: No  Tobacco Use  . Smoking status: Never Smoker  . Smokeless tobacco: Never Used  . Tobacco comment: does not smoke   Substance and Sexual Activity  . Alcohol use: Yes    Alcohol/week: 1.0 standard drinks    Types: 1 Glasses of wine per week    Comment: with meal  .  Drug use: No  . Sexual activity: Yes  Lifestyle  . Physical activity:    Days per week: 3 days    Minutes per session: 60 min  . Stress: Not at all  Relationships  . Social connections:    Talks on phone: More than three times a week    Gets together: More than three times a week    Attends religious service: More than 4 times per year    Active member of club or organization: Yes    Attends meetings of clubs or organizations: More than 4 times per year    Relationship status: Married  Other Topics Concern  . Not on file  Social History Narrative   Married 37+ years, 2 adult children and one granddaughter. Engineer, building services at [employer].      Family History  Problem Relation Age of Onset  . Heart disease Father   . Cancer Father        Bladder Cancer  . Heart attack Father   . Hypertension Mother   . Alzheimer's disease Mother   . Stroke Mother   . Pneumonia Sister   . Heart disease Brother        stents  . Diabetes Brother        type 2      Labs/Other Tests and Data Reviewed:    Wt Readings from Last 3 Encounters:  05/22/18 213 lb (96.6 kg)  04/27/18 198 lb 7.8 oz (90 kg)  04/23/18 216 lb 2 oz (98 kg)   Temp Readings from Last 3 Encounters:  05/22/18 (!) 97.4 F (36.3 C) (Oral)  04/28/18 (!) 97.5 F (36.4 C) (Oral)  04/23/18 97.8 F (36.6 C) (Oral)   BP Readings from Last 3 Encounters:  05/22/18 (!) 142/75  04/28/18 122/70  04/23/18 137/75   Pulse Readings from Last 3  Encounters:  05/22/18 67  04/28/18 (!) 58  04/23/18 (!) 55     Lab Results  Component Value Date   HGBA1C 5.2% 04/12/2014   Lab Results  Component Value Date   LDLCALC 42 05/22/2018   CREATININE 0.94 05/22/2018       Chemistry      Component Value Date/Time   NA 140 05/22/2018 1744   K 4.3 05/22/2018 1744   CL 101 05/22/2018 1744   CO2 23 05/22/2018 1744   BUN 17 05/22/2018 1744   CREATININE 0.94 05/22/2018 1744   CREATININE 0.81 05/04/2013 0822      Component Value Date/Time   CALCIUM 9.3 05/22/2018 1744   ALKPHOS 93 05/22/2018 1744   AST 19 05/22/2018 1744   ALT 14 05/22/2018 1744   BILITOT 0.4 05/22/2018 1744         OBSERVATIONS/ OBJECTIVE:     Other than feeling somewhat fatigued, and the patient attributes this to recovering from the knee surgery he is doing well.  His knee is recovering well.  He has not been running any fever or having any coughing or congestion.  He is due to get lab work and will come in sometime in June or July to get this done.  He will also get a urinalysis because of the family history of bladder cancer.  He has not refilled his nitroglycerin in quite a while.  I told him we should have a new bottle so that it is up-to-date and hope that he never has to use this.  We will call this prescription in for him.  He will continue with his  Repatha and he is not taking a statin drug only the Repatha.  It is done well with him with no side effects.  Physical exam deferred due to nature of telephonic visit.  ASSESSMENT & PLAN    Time:   Today, I have spent 38 minutes with the patient via telephone discussing the above including Covid precautions.     Visit Diagnoses: 1. Tick bite of abdomen, initial encounter -The patient is currently finishing up his doxycycline and has about 6 more days worth of medicine to take.  2. Fatigue, unspecified type -The patient should continue to start some good physical therapy following his knee replacement  and hopefully build up his endurance and tolerance to help the fatigue that he has developed from not being active.  3. Vitamin D deficiency -Continue vitamin D replacement pending results of lab work  4. Thrombocytopenia ([place]) -No history of any bleeding or bruising during the phone visit today.  5. Leg weakness, bilateral -Continue with more physical activity as recommended by the orthopedist  6. Low back pain with sciatica, sciatica laterality unspecified, unspecified back pain laterality, unspecified chronicity -Continue to follow-up with orthopedist  7. Essential hypertension -No blood pressure readings to report today.  Patient will continue with current treatment  8. Hyperlipidemia LDL goal <70 -Continue with Repatha and therapeutic lifestyle changes  9. Family history of bladder cancer -Get urinalysis with next lab work  42. Erectile dysfunction due to arterial insufficiency -Continue to follow-up with urology as needed  11. AAA (abdominal aortic aneurysm) without rupture [facility]) -Cardiologist plans to get ultrasound in June  12.  Status post CABG -Continue aggressive therapeutic lifestyle changes and statin therapy along with Repatha  Patient Instructions  Stay active physically and drink plenty of fluids Continue to be careful and practice good hand and pulmonary hygiene Get lab work about mid July timeframe plus urinalysis Follow-up with cardiology as planned Follow-up with urology as planned Keep regular eye checks Follow-up with orthopedist as planned Please use deep [place] off or [place] tick spray.  Do prevention to keep from getting tick bites Have the yard sprayed if necessary We will refill nitroglycerin Align, a probiotic would be good to take because of the loose stools with the antibiotic    The above assessment and management plan was discussed with the patient. The patient verbalized understanding of and has agreed to the management plan. Patient is  aware to call the clinic if symptoms persist or worsen. Patient is aware when to return to the clinic for a follow-up visit. Patient educated on when it is appropriate to go to the emergency department.    Chipper Herb, MD [address], [place], [place] [address] Ph [phone]   Arrie Senate MD

## 2018-08-13 DIAGNOSIS — H2513 Age-related nuclear cataract, bilateral: Secondary | ICD-10-CM | POA: Diagnosis not present

## 2018-08-18 ENCOUNTER — Telehealth: Payer: Self-pay | Admitting: Family Medicine

## 2018-08-18 NOTE — Telephone Encounter (Signed)
Patient's wife states that patient received answer from Dr. Laurance Flatten.

## 2018-08-20 ENCOUNTER — Telehealth: Payer: Self-pay | Admitting: Family Medicine

## 2018-08-20 NOTE — Telephone Encounter (Signed)
Out of samples at this time.  Patient aware

## 2018-08-25 ENCOUNTER — Other Ambulatory Visit: Payer: Self-pay

## 2018-08-25 ENCOUNTER — Other Ambulatory Visit: Payer: Medicare Other

## 2018-08-25 DIAGNOSIS — E785 Hyperlipidemia, unspecified: Secondary | ICD-10-CM

## 2018-08-25 DIAGNOSIS — E559 Vitamin D deficiency, unspecified: Secondary | ICD-10-CM

## 2018-08-25 DIAGNOSIS — N5201 Erectile dysfunction due to arterial insufficiency: Secondary | ICD-10-CM

## 2018-08-25 DIAGNOSIS — I714 Abdominal aortic aneurysm, without rupture, unspecified: Secondary | ICD-10-CM

## 2018-08-25 DIAGNOSIS — Z8052 Family history of malignant neoplasm of bladder: Secondary | ICD-10-CM

## 2018-08-25 DIAGNOSIS — I10 Essential (primary) hypertension: Secondary | ICD-10-CM | POA: Diagnosis not present

## 2018-08-25 DIAGNOSIS — D696 Thrombocytopenia, unspecified: Secondary | ICD-10-CM | POA: Diagnosis not present

## 2018-08-25 DIAGNOSIS — R29898 Other symptoms and signs involving the musculoskeletal system: Secondary | ICD-10-CM | POA: Diagnosis not present

## 2018-08-25 LAB — URINALYSIS, COMPLETE
Bilirubin, UA: NEGATIVE
Glucose, UA: NEGATIVE
Ketones, UA: NEGATIVE
Leukocytes,UA: NEGATIVE
Nitrite, UA: NEGATIVE
Protein,UA: NEGATIVE
Specific Gravity, UA: 1.02 (ref 1.005–1.030)
Urobilinogen, Ur: 0.2 mg/dL (ref 0.2–1.0)
pH, UA: 5.5 (ref 5.0–7.5)

## 2018-08-25 LAB — MICROSCOPIC EXAMINATION
Bacteria, UA: NONE SEEN
Epithelial Cells (non renal): NONE SEEN /hpf (ref 0–10)
Renal Epithel, UA: NONE SEEN /hpf
WBC, UA: NONE SEEN /hpf (ref 0–5)

## 2018-08-25 LAB — LIPID PANEL

## 2018-08-26 ENCOUNTER — Telehealth: Payer: Self-pay | Admitting: Family Medicine

## 2018-08-26 DIAGNOSIS — E785 Hyperlipidemia, unspecified: Secondary | ICD-10-CM

## 2018-08-26 LAB — VITAMIN D 25 HYDROXY (VIT D DEFICIENCY, FRACTURES): Vit D, 25-Hydroxy: 76.2 ng/mL (ref 30.0–100.0)

## 2018-08-26 LAB — BMP8+EGFR
BUN/Creatinine Ratio: 20 (ref 10–24)
BUN: 17 mg/dL (ref 8–27)
CO2: 22 mmol/L (ref 20–29)
Calcium: 9.4 mg/dL (ref 8.6–10.2)
Chloride: 105 mmol/L (ref 96–106)
Creatinine, Ser: 0.87 mg/dL (ref 0.76–1.27)
GFR calc Af Amer: 98 mL/min/{1.73_m2} (ref >59)
GFR calc non Af Amer: 84 mL/min/{1.73_m2} (ref >59)
Glucose: 105 mg/dL — ABNORMAL HIGH (ref 65–99)
Potassium: 4.2 mmol/L (ref 3.5–5.2)
Sodium: 142 mmol/L (ref 134–144)

## 2018-08-26 LAB — CBC WITH DIFFERENTIAL/PLATELET
Basophils Absolute: 0 10*3/uL (ref 0.0–0.2)
Basos: 0 %
EOS (ABSOLUTE): 0.1 10*3/uL (ref 0.0–0.4)
Eos: 1 %
Hematocrit: 42.4 % (ref 37.5–51.0)
Hemoglobin: 14.6 g/dL (ref 13.0–17.7)
Immature Grans (Abs): 0 10*3/uL (ref 0.0–0.1)
Immature Granulocytes: 0 %
Lymphocytes Absolute: 1.8 10*3/uL (ref 0.7–3.1)
Lymphs: 27 %
MCH: 30.5 pg (ref 26.6–33.0)
MCHC: 34.4 g/dL (ref 31.5–35.7)
MCV: 89 fL (ref 79–97)
Monocytes Absolute: 0.6 10*3/uL (ref 0.1–0.9)
Monocytes: 9 %
Neutrophils Absolute: 4.3 10*3/uL (ref 1.4–7.0)
Neutrophils: 63 %
Platelets: 167 10*3/uL (ref 150–450)
RBC: 4.79 x10E6/uL (ref 4.14–5.80)
RDW: 13.3 % (ref 11.6–15.4)
WBC: 6.8 10*3/uL (ref 3.4–10.8)

## 2018-08-26 LAB — LIPID PANEL
Chol/HDL Ratio: 2.4 ratio (ref 0.0–5.0)
Cholesterol, Total: 135 mg/dL (ref 100–199)
HDL: 56 mg/dL (ref >39)
LDL Calculated: 64 mg/dL (ref 0–99)
Triglycerides: 74 mg/dL (ref 0–149)
VLDL Cholesterol Cal: 15 mg/dL (ref 5–40)

## 2018-08-26 LAB — HEPATIC FUNCTION PANEL
ALT: 16 IU/L (ref 0–44)
AST: 18 IU/L (ref 0–40)
Albumin: 4.5 g/dL (ref 3.7–4.7)
Alkaline Phosphatase: 94 IU/L (ref 39–117)
Bilirubin Total: 0.5 mg/dL (ref 0.0–1.2)
Bilirubin, Direct: 0.14 mg/dL (ref 0.00–0.40)
Total Protein: 6.7 g/dL (ref 6.0–8.5)

## 2018-08-26 NOTE — Telephone Encounter (Signed)
Pt notified he will have to come back in for redraw Pt also informed that insurance may not cover cost Pt still requesting lab Order entered in Epic

## 2018-08-27 ENCOUNTER — Ambulatory Visit (HOSPITAL_COMMUNITY)
Admission: RE | Admit: 2018-08-27 | Discharge: 2018-08-27 | Disposition: A | Discharge status: Home / Self Care | Payer: Medicare Other | Source: Ambulatory Visit | Attending: Internal Medicine | Admitting: Internal Medicine

## 2018-08-27 ENCOUNTER — Telehealth: Payer: Self-pay | Admitting: Family Medicine

## 2018-08-27 ENCOUNTER — Other Ambulatory Visit: Payer: Self-pay

## 2018-08-27 DIAGNOSIS — I714 Abdominal aortic aneurysm, without rupture, unspecified: Secondary | ICD-10-CM

## 2018-08-28 NOTE — Telephone Encounter (Signed)
Pt is concerned about NMR profile not being ran with his last blood work, just plain lipid panel. Does he need the NMR and not just lipid panel? Please advise.

## 2018-08-29 NOTE — Telephone Encounter (Signed)
Please get advanced lipid panel. We have not been doing as many due to insurance not paying

## 2018-08-31 NOTE — Telephone Encounter (Signed)
Aware.  Advance lipid profile has been ordered.

## 2018-09-01 ENCOUNTER — Other Ambulatory Visit: Payer: Self-pay

## 2018-09-01 ENCOUNTER — Other Ambulatory Visit: Payer: Medicare Other

## 2018-09-01 DIAGNOSIS — E785 Hyperlipidemia, unspecified: Secondary | ICD-10-CM | POA: Diagnosis not present

## 2018-09-02 ENCOUNTER — Other Ambulatory Visit: Payer: Self-pay | Admitting: *Deleted

## 2018-09-02 DIAGNOSIS — I714 Abdominal aortic aneurysm, without rupture, unspecified: Secondary | ICD-10-CM

## 2018-09-02 LAB — NMR, LIPOPROFILE
Cholesterol, Total: 130 mg/dL (ref 100–199)
HDL Particle Number: 34 umol/L (ref >=30.5)
HDL-C: 51 mg/dL (ref >39)
LDL Particle Number: 927 nmol/L (ref <1000)
LDL Size: 21 nm (ref >20.5)
LDL-C: 64 mg/dL (ref 0–99)
LP-IR Score: 43 (ref <=45)
Small LDL Particle Number: 497 nmol/L (ref <=527)
Triglycerides: 73 mg/dL (ref 0–149)

## 2018-09-11 ENCOUNTER — Other Ambulatory Visit: Payer: Self-pay | Admitting: Cardiology

## 2018-09-11 NOTE — Telephone Encounter (Signed)
Rx(s) sent to pharmacy electronically.  

## 2018-09-18 ENCOUNTER — Ambulatory Visit (INDEPENDENT_AMBULATORY_CARE_PROVIDER_SITE_OTHER): Payer: Medicare Other | Admitting: *Deleted

## 2018-09-18 VITALS — BP 136/71 | HR 70 | Ht 73.0 in | Wt 213.0 lb

## 2018-09-18 DIAGNOSIS — Z0000 Encounter for general adult medical examination without abnormal findings: Secondary | ICD-10-CM

## 2018-09-18 NOTE — Progress Notes (Signed)
MEDICARE ANNUAL WELLNESS VISIT  09/18/2018  Telephone Visit Disclaimer This Medicare AWV was conducted by telephone due to national recommendations for restrictions regarding the COVID-19 Pandemic (e.g. social distancing).  I verified, using two identifiers, that I am speaking with [name] [name] or their authorized healthcare agent. I discussed the limitations, risks, security, and privacy concerns of performing an evaluation and management service by telephone and the potential availability of an in-person appointment in the future. The patient expressed understanding and agreed to proceed.   Subjective:  [name] [name] is a 78 y.o. male patient of [name], DO who had a Medicare Annual Wellness Visit today via telephone. [name] [name] is Retired and lives with their spouse. he has 2 children. he reports that he is socially active and does interact with friends/family regularly. he is moderately physically active and enjoys playing golf and doing yardwork.  Patient Care Team: [name], DO as PCP - General (Family Medicine) Minus Breeding, MD as PCP - Cardiology (Cardiology) Rutherford Guys, MD as Consulting Physician (Ophthalmology) Clarene Essex, MD as Consulting Physician (Gastroenterology) Irine Seal, MD as Attending Physician (Urology) Gaynelle Arabian, MD as Consulting Physician (Orthopedic Surgery) Lavonna Monarch, MD as Consulting Physician (Dermatology)  Advanced Directives 09/18/2018 05/01/2018 04/27/2018 04/23/2018 09/02/2017 07/25/2016 05/09/2014  Does Patient Have a Medical Advance Directive? Yes Yes Yes Yes Yes Yes No  Type of Advance Directive Living will;Healthcare Power of [place];Living will [place];Living will [place];Living will Living will;Healthcare Power of [place];Living will -  Does patient want to make changes to medical advance directive? No -  Patient declined - No - Patient declined No - Patient declined No - Patient declined Yes (Inpatient - patient defers changing a medical advance directive at this time) -  Copy of [place] in Chart? No - copy requested - No - copy requested No - copy requested No - copy requested No - copy requested -  Would patient like information on creating a medical advance directive? - - - - - - No - patient declined information    Hospital Utilization Over the Past 12 Months: # of hospitalizations or ER visits: 0 # of surgeries: 0  Review of Systems    Patient reports that his overall health is unchanged compared to last year.  Patient Reported Readings (BP, Pulse, CBG, Weight, etc) BP 136/71   Pulse 70   Ht 6\' 1"  (1.854 m)   Wt 213 lb (96.6 kg)   BMI 28.10 kg/m    Review of Systems: General ROS: negative  All other systems negative.  Pain Assessment       Current Medications & Allergies (verified) Allergies as of 09/18/2018      Reactions   Levofloxacin    Caused C- Diff   Crestor [rosuvastatin] Other (See Comments)   Liver problems   Vytorin [ezetimibe-simvastatin] Other (See Comments)   Liver problems      Medication List       Accurate as of September 18, 2018  2:28 PM. If you have any questions, ask your nurse or doctor.        STOP taking these medications   doxycycline 100 MG tablet Commonly known as: VIBRA-TABS     TAKE these medications   aspirin EC 81 MG tablet Take 81 mg by mouth daily.   Evolocumab 140 MG/ML Sosy Commonly known as: Repatha Inject 140 mg into the  skin every 14 (fourteen) days.   fluticasone 50 MCG/ACT nasal spray Commonly known as: FLONASE Place 2 sprays into both nostrils daily as needed for allergies or rhinitis.   metoprolol tartrate 25 MG tablet Commonly known as: LOPRESSOR Take 1 tablet (25 mg total) by mouth every morning AND 2 tablets (50 mg total) every evening.   multivitamin with minerals Tabs tablet Take  1 tablet by mouth daily.   nitroGLYCERIN 0.4 MG SL tablet Commonly known as: NITROSTAT Place 1 tablet (0.4 mg total) under the tongue every 5 (five) minutes as needed for chest pain.   omeprazole 20 MG capsule Commonly known as: PRILOSEC Take 20 mg by mouth daily.   OVER THE COUNTER MEDICATION Tumeric, B12 complex and  probio and vit d 5000 qd       History (reviewed): Past Medical History:  Diagnosis Date  . CAD (coronary artery disease)   . Colon polyps   . Dyslipidemia   . Herniated disc   . Hiatal hernia   . HTN (hypertension)   . Hyperlipidemia   . Kidney stones    stones   Past Surgical History:  Procedure Laterality Date  . CORONARY ARTERY BYPASS GRAFT  2006   LIMA to LAD, SVG to RCA, SVG to circumflex, SVG to diagonal   . mole removed  2003  . right knee surgery  2008  . TONSILLECTOMY    . TOTAL KNEE ARTHROPLASTY Right 04/27/2018   Procedure: TOTAL KNEE ARTHROPLASTY RIGHT;  Surgeon: Gaynelle Arabian, MD;  Location: [facility];  Service: Orthopedics;  Laterality: Right;  36min   Family History  Problem Relation Age of Onset  . Heart disease Father   . Cancer Father        Bladder Cancer  . Heart attack Father   . Colon polyps Father   . Hypertension Mother   . Alzheimer's disease Mother   . Stroke Mother        3-4   . Diabetes Brother   . Heart disease Brother   . Arthritis Brother        bilateral knee replacement, back issues   . Pneumonia Sister   . GI problems Sister        esophogus stretched   . Crohn's disease Son    Social History   Socioeconomic History  . Marital status: Married    Spouse name: Constance Holster  . Number of children: 2  . Years of education: Not on file  . Highest education level: Bachelor's degree (e.g., BA, AB, BS)  Occupational History  . Occupation: Armed forces technical officer for [employer]  . Financial resource strain: Not hard at all  . Food insecurity    Worry: Never true    Inability: Never true  .  Transportation needs    Medical: No    Non-medical: No  Tobacco Use  . Smoking status: Never Smoker  . Smokeless tobacco: Never Used  . Tobacco comment: does not smoke   Substance and Sexual Activity  . Alcohol use: Yes    Alcohol/week: 1.0 standard drinks    Types: 1 Glasses of wine per week    Comment: with meal  . Drug use: No  . Sexual activity: Yes  Lifestyle  . Physical activity    Days per week: 3 days    Minutes per session: 60 min  . Stress: Not at all  Relationships  . Social connections    Talks on phone: More than three times a  week    Gets together: More than three times a week    Attends religious service: More than 4 times per year    Active member of club or organization: Yes    Attends meetings of clubs or organizations: More than 4 times per year    Relationship status: Married  Other Topics Concern  . Not on file  Social History Narrative   Married 37+ years, 2 adult children and one granddaughter. Engineer, building services at [employer].     Activities of Daily Living In your present state of health, do you have any difficulty performing the following activities: 09/18/2018 04/27/2018  Hearing? N N  Vision? Y N  Comment wears RX glasses -  Difficulty concentrating or making decisions? N N  Walking or climbing stairs? N Y  Dressing or bathing? N N  Doing errands, shopping? N N  Preparing Food and eating ? N -  Using the Toilet? N -  In the past six months, have you accidently leaked urine? N -  Do you have problems with loss of bowel control? N -  Managing your Medications? N -  Managing your Finances? N -  Housekeeping or managing your Housekeeping? N -  Some recent data might be hidden    Patient Education/ Literacy    Exercise Current Exercise Habits: Home exercise routine, Type of exercise: walking;Other - see comments;strength training/weights(golf), Time (Minutes): 30, Frequency (Times/Week): 6, Weekly Exercise (Minutes/Week): 180, Intensity:  Mild, Exercise limited by: None identified  Diet Patient reports consuming 3 meals a day and 1 snack(s) a day Patient reports that his primary diet is: Regular Patient reports that she does have regular access to food.   Depression Screen PHQ 2/9 Scores 09/18/2018 06/25/2018 05/22/2018 01/06/2018 10/11/2017 09/02/2017 08/28/2017  PHQ - 2 Score 0 0 0 0 0 0 0  PHQ- 9 Score - - - - - - -     Fall Risk Fall Risk  09/18/2018 05/22/2018 01/06/2018 10/11/2017 08/28/2017  Falls in the past year? 1 0 1 Yes No  Number falls in past yr: 0 - 0 1 -  Injury with Fall? 0 - 0 (No Data) -  Comment - - - Knot on hip. -     Objective:  [name] [name] seemed alert and oriented and he participated appropriately during our telephone visit.  Blood Pressure Weight BMI  BP Readings from Last 3 Encounters:  09/18/18 136/71  05/22/18 (!) 142/75  04/28/18 122/70   Wt Readings from Last 3 Encounters:  09/18/18 213 lb (96.6 kg)  05/22/18 213 lb (96.6 kg)  04/27/18 198 lb 7.8 oz (90 kg)   BMI Readings from Last 1 Encounters:  09/18/18 28.10 kg/m    *Unable to obtain current vital signs, weight, and BMI due to telephone visit type  Hearing/Vision  . [place] did not seem to have difficulty with hearing/understanding during the telephone conversation . Reports that he has had a formal eye exam by an eye care professional within the past year . Reports that he has not had a formal hearing evaluation within the past year *Unable to fully assess hearing and vision during telephone visit type  Cognitive Function: 6CIT Screen 09/18/2018  What Year? 0 points  What month? 0 points  What time? 0 points  Count back from 20 0 points  Months in reverse 0 points  Repeat phrase 0 points  Total Score 0   (Normal:0-7, Significant for Dysfunction: >8)  Normal Cognitive Function Screening: Yes  Immunization & Health Maintenance Record Immunization History  Administered Date(s) Administered  . Influenza, High Dose  Seasonal PF 12/21/2015, 12/05/2016, 12/23/2017  . Influenza,inj,Quad PF,6+ Mos 12/03/2012, 12/30/2013, 01/03/2015  . Influenza,inj,quad, With Preservative 12/02/2016  . Pneumococcal Conjugate-13 01/12/2014  . Pneumococcal Polysaccharide-23 10/01/2012  . Tdap 07/03/2010, 01/17/2011  . Zoster Recombinat (Shingrix) 07/25/2016, 01/09/2017    Health Maintenance  Topic Date Due  . INFLUENZA VACCINE  10/03/2018  . COLON CANCER SCREENING ANNUAL FOBT  01/15/2019  . TETANUS/TDAP  01/16/2021  . COLONOSCOPY  06/29/2025  . PNA vac Low Risk Adult  Completed       Assessment  This is a routine wellness examination for [employer].  Health Maintenance: Due or Overdue There are no preventive care reminders to display for this patient.  Christoper Allegra Hartin does not need a referral for Community Assistance: Care Management:   no Social Work:    no Prescription Assistance:  no Nutrition/Diabetes Education:  no   Plan:  Personalized Goals Goals Addressed            This Visit's Progress   . Exercise 3x per week (30 min per time)   On track   . Have 3 meals a day   Improving   . prevent falls       Stay active       Personalized Health Maintenance & Screening Recommendations  up to date  Lung Cancer Screening Recommended: no (Low Dose CT Chest recommended if Age 22-80 years, 30 pack-year currently smoking OR have quit w/in past 15 years) Hepatitis C Screening recommended: no HIV Screening recommended: no  Advanced Directives: Written information was not prepared per patient's request.  Referrals & Orders No orders of the defined types were placed in this encounter.   Follow-up Plan . Follow-up with [name], DO as planned.   I have personally reviewed and noted the following in the patient's chart:   . Medical and social history . Use of alcohol, tobacco or illicit drugs  . Current medications and supplements . Functional ability and status .  Nutritional status . Physical activity . Advanced directives . List of other physicians . Hospitalizations, surgeries, and ER visits in previous 12 months . Vitals . Screenings to include cognitive, depression, and falls . Referrals and appointments  In addition, I have reviewed and discussed with [name] [name] certain preventive protocols, quality metrics, and best practice recommendations. A written personalized care plan for preventive services as well as general preventive health recommendations is available and can be mailed to the patient at his request.      [place] Dec  09/18/2018

## 2018-10-31 ENCOUNTER — Other Ambulatory Visit: Payer: Self-pay | Admitting: Physician Assistant

## 2018-10-31 MED ORDER — DOXYCYCLINE HYCLATE 100 MG PO TABS: 100.0000 mg | ORAL_TABLET | Freq: Two times a day (BID) | ORAL | 0 refills | Status: DC

## 2018-11-13 ENCOUNTER — Telehealth: Payer: Self-pay | Admitting: Family Medicine

## 2018-11-13 NOTE — Telephone Encounter (Signed)
Pt aware that we have one sample

## 2018-11-19 ENCOUNTER — Ambulatory Visit: Payer: Medicare Other | Admitting: Family Medicine

## 2018-11-24 DIAGNOSIS — L821 Other seborrheic keratosis: Secondary | ICD-10-CM | POA: Diagnosis not present

## 2018-11-24 DIAGNOSIS — D229 Melanocytic nevi, unspecified: Secondary | ICD-10-CM | POA: Diagnosis not present

## 2018-11-27 ENCOUNTER — Other Ambulatory Visit: Payer: Self-pay

## 2018-11-30 ENCOUNTER — Other Ambulatory Visit: Payer: Self-pay

## 2018-11-30 ENCOUNTER — Other Ambulatory Visit: Payer: Medicare Other

## 2018-11-30 DIAGNOSIS — E785 Hyperlipidemia, unspecified: Secondary | ICD-10-CM

## 2018-11-30 DIAGNOSIS — I10 Essential (primary) hypertension: Secondary | ICD-10-CM

## 2018-11-30 DIAGNOSIS — N400 Enlarged prostate without lower urinary tract symptoms: Secondary | ICD-10-CM

## 2018-11-30 DIAGNOSIS — E559 Vitamin D deficiency, unspecified: Secondary | ICD-10-CM | POA: Diagnosis not present

## 2018-11-30 DIAGNOSIS — Z8052 Family history of malignant neoplasm of bladder: Secondary | ICD-10-CM

## 2018-11-30 DIAGNOSIS — D696 Thrombocytopenia, unspecified: Secondary | ICD-10-CM | POA: Diagnosis not present

## 2018-11-30 NOTE — Addendum Note (Signed)
Addended by: Zannie Cove on: 11/30/2018 03:50 PM   Modules accepted: Orders

## 2018-12-01 ENCOUNTER — Other Ambulatory Visit: Payer: Self-pay

## 2018-12-01 LAB — CBC WITH DIFFERENTIAL/PLATELET
Basophils Absolute: 0 10*3/uL (ref 0.0–0.2)
Basos: 1 %
EOS (ABSOLUTE): 0.1 10*3/uL (ref 0.0–0.4)
Eos: 1 %
Hematocrit: 44.4 % (ref 37.5–51.0)
Hemoglobin: 14.9 g/dL (ref 13.0–17.7)
Immature Grans (Abs): 0 10*3/uL (ref 0.0–0.1)
Immature Granulocytes: 1 %
Lymphocytes Absolute: 2.2 10*3/uL (ref 0.7–3.1)
Lymphs: 33 %
MCH: 32 pg (ref 26.6–33.0)
MCHC: 33.6 g/dL (ref 31.5–35.7)
MCV: 96 fL (ref 79–97)
Monocytes Absolute: 0.5 10*3/uL (ref 0.1–0.9)
Monocytes: 8 %
Neutrophils Absolute: 3.8 10*3/uL (ref 1.4–7.0)
Neutrophils: 56 %
Platelets: 148 10*3/uL — ABNORMAL LOW (ref 150–450)
RBC: 4.65 x10E6/uL (ref 4.14–5.80)
RDW: 13 % (ref 11.6–15.4)
WBC: 6.7 10*3/uL (ref 3.4–10.8)

## 2018-12-01 LAB — CMP14+EGFR
ALT: 16 IU/L (ref 0–44)
AST: 20 IU/L (ref 0–40)
Albumin/Globulin Ratio: 1.8 (ref 1.2–2.2)
Albumin: 4.3 g/dL (ref 3.7–4.7)
Alkaline Phosphatase: 81 IU/L (ref 39–117)
BUN/Creatinine Ratio: 20 (ref 10–24)
BUN: 17 mg/dL (ref 8–27)
Bilirubin Total: 0.5 mg/dL (ref 0.0–1.2)
CO2: 23 mmol/L (ref 20–29)
Calcium: 9.7 mg/dL (ref 8.6–10.2)
Chloride: 105 mmol/L (ref 96–106)
Creatinine, Ser: 0.87 mg/dL (ref 0.76–1.27)
GFR calc Af Amer: 97 mL/min/{1.73_m2} (ref >59)
GFR calc non Af Amer: 84 mL/min/{1.73_m2} (ref >59)
Globulin, Total: 2.4 g/dL (ref 1.5–4.5)
Glucose: 86 mg/dL (ref 65–99)
Potassium: 4.2 mmol/L (ref 3.5–5.2)
Sodium: 142 mmol/L (ref 134–144)
Total Protein: 6.7 g/dL (ref 6.0–8.5)

## 2018-12-01 LAB — PSA, TOTAL AND FREE
PSA, Free Pct: 27.7 %
PSA, Free: 0.61 ng/mL
Prostate Specific Ag, Serum: 2.2 ng/mL (ref 0.0–4.0)

## 2018-12-01 LAB — LIPID PANEL
Chol/HDL Ratio: 2.4 ratio (ref 0.0–5.0)
Cholesterol, Total: 123 mg/dL (ref 100–199)
HDL: 52 mg/dL (ref >39)
LDL Chol Calc (NIH): 52 mg/dL (ref 0–99)
Triglycerides: 105 mg/dL (ref 0–149)
VLDL Cholesterol Cal: 19 mg/dL (ref 5–40)

## 2018-12-01 LAB — VITAMIN D 25 HYDROXY (VIT D DEFICIENCY, FRACTURES): Vit D, 25-Hydroxy: 69 ng/mL (ref 30.0–100.0)

## 2018-12-02 ENCOUNTER — Ambulatory Visit (INDEPENDENT_AMBULATORY_CARE_PROVIDER_SITE_OTHER): Payer: Medicare Other | Admitting: Family Medicine

## 2018-12-02 ENCOUNTER — Encounter: Payer: Self-pay | Admitting: Family Medicine

## 2018-12-02 VITALS — BP 144/72 | HR 59 | Temp 98.6°F | Ht 73.0 in | Wt 214.0 lb

## 2018-12-02 DIAGNOSIS — E785 Hyperlipidemia, unspecified: Secondary | ICD-10-CM

## 2018-12-02 DIAGNOSIS — I10 Essential (primary) hypertension: Secondary | ICD-10-CM | POA: Diagnosis not present

## 2018-12-02 DIAGNOSIS — D696 Thrombocytopenia, unspecified: Secondary | ICD-10-CM

## 2018-12-02 DIAGNOSIS — I714 Abdominal aortic aneurysm, without rupture, unspecified: Secondary | ICD-10-CM

## 2018-12-02 DIAGNOSIS — Z23 Encounter for immunization: Secondary | ICD-10-CM | POA: Diagnosis not present

## 2018-12-02 DIAGNOSIS — Z8052 Family history of malignant neoplasm of bladder: Secondary | ICD-10-CM

## 2018-12-02 LAB — URINALYSIS, COMPLETE
Bilirubin, UA: NEGATIVE
Glucose, UA: NEGATIVE
Ketones, UA: NEGATIVE
Leukocytes,UA: NEGATIVE
Nitrite, UA: NEGATIVE
Protein,UA: NEGATIVE
Specific Gravity, UA: 1.01 (ref 1.005–1.030)
Urobilinogen, Ur: 0.2 mg/dL (ref 0.2–1.0)
pH, UA: 6.5 (ref 5.0–7.5)

## 2018-12-02 LAB — MICROSCOPIC EXAMINATION
Bacteria, UA: NONE SEEN
Epithelial Cells (non renal): NONE SEEN /hpf (ref 0–10)
Renal Epithel, UA: NONE SEEN /hpf
WBC, UA: NONE SEEN /hpf (ref 0–5)

## 2018-12-02 NOTE — Progress Notes (Signed)
Subjective: CC: HTN, HLD, CAD PCP: [name] Norlander, DO [id number] [name] [name] is a 78 y.o. male presenting to clinic today for:  1.  Hypertension with hyperlipidemia and coronary artery disease History: Quadruple bypass 14 years ago with Dr. Darcey Nora.  Followed by Dr. Percival Spanish for cardiology Patient with known AAA without rupture.  He reports compliance with Repatha every 2 weeks, metoprolol 25 mg and daily aspirin.  Average blood pressures are in the 130s over 70s at home.  Blood pressure slightly elevated today because he has a family member undergoing surgery currently for scoliosis.  No chest pain, shortness of breath, lower extremity edema.  He tries to maintain physical activity and overall does really well.  2.  History of knee replacement Patient with history of right-sided knee replacement 5 months ago with Dr. Ricki [name].  He has rehabbed very well and is feeling well.  Denies any concerns from an orthopedic standpoint.  3.  BPH/family history of bladder cancer Father with family history of bladder cancer.  His previous PCP was keeping up with interval urine specimens to look for blood.  He is always had "trace blood" on the urine dip but under microscopy no significant findings.  He is followed closely by urology at least once per year, he sees Dr. Jeffie Pollock.  He last had an evaluation in July.  He is not currently on any medications for BPH.  Denies any urinary symptoms.   ROS: Per HPI  Allergies  Allergen Reactions  . Levofloxacin     Caused C- Diff  . Crestor [Rosuvastatin] Other (See Comments)    Liver problems  . Vytorin [Ezetimibe-Simvastatin] Other (See Comments)    Liver problems   Past Medical History:  Diagnosis Date  . AAA (abdominal aortic aneurysm) ([place])    gets abd [place] every 3 years to watch  . CAD (coronary artery disease)   . Colon polyps   . Dyslipidemia   . Herniated disc   . Hiatal hernia   . HTN (hypertension)   . Hyperlipidemia   . Kidney stones     stones    Current Outpatient Medications:  .  aspirin EC 81 MG tablet, Take 81 mg by mouth daily., Disp: , Rfl:  .  Evolocumab (REPATHA) 140 MG/ML SOSY, Inject 140 mg into the skin every 14 (fourteen) days., Disp: 6 Syringe, Rfl: 3 .  metoprolol tartrate (LOPRESSOR) 25 MG tablet, Take 1 tablet (25 mg total) by mouth every morning AND 2 tablets (50 mg total) every evening., Disp: 270 tablet, Rfl: 1 .  Multiple Vitamin (MULTIVITAMIN WITH MINERALS) TABS tablet, Take 1 tablet by mouth daily., Disp: , Rfl:  .  omeprazole (PRILOSEC) 20 MG capsule, Take 20 mg by mouth daily., Disp: , Rfl:  .  OVER THE COUNTER MEDICATION, Tumeric, B12 complex and  probio and vit d 5000 qd, Disp: , Rfl:  .  fluticasone (FLONASE) 50 MCG/ACT nasal spray, Place 2 sprays into both nostrils daily as needed for allergies or rhinitis., Disp: , Rfl:  .  nitroGLYCERIN (NITROSTAT) 0.4 MG SL tablet, Place 1 tablet (0.4 mg total) under the tongue every 5 (five) minutes as needed for chest pain. (Patient not taking: Reported on 09/18/2018), Disp: 25 tablet, Rfl: prn Social History   Socioeconomic History  . Marital status: Married    Spouse name: [name] [name]  . Number of children: 2  . Years of education: Not on file  . Highest education level: Bachelor's degree (e.g., BA, AB, BS)  Occupational  History  . 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 Financial resource strain: Not hard at all  . Food insecurity    Worry: Never true    Inability: Never true  . Transportation needs    Medical: No    Non-medical: No  Tobacco Use  . Smoking status: Never Smoker  . Smokeless tobacco: Never Used  . Tobacco comment: does not smoke   Substance and Sexual Activity  . Alcohol use: Yes    Alcohol/week: 1.0 standard drinks    Types: 1 Glasses of wine per week    Comment: with meal  . Drug use: No  . Sexual activity: Yes  Lifestyle  . Physical activity    Days per week: 3 days    Minutes per session:  60 min  . Stress: Not at all  Relationships  . Social connections    Talks on phone: More than three times a week    Gets together: More than three times a week    Attends religious service: More than 4 times per year    Active member of club or organization: Yes    Attends meetings of clubs or organizations: More than 4 times per year    Relationship status: Married  . Intimate partner violence    Fear of current or ex partner: No    Emotionally abused: No    Physically abused: No    Forced sexual activity: No  Other Topics Concern  . Not on file  Social History Narrative   Married 37+ years, 2 adult children and one granddaughter. Engineer, building services at [employer].    Family History  Problem Relation Age of Onset  . Heart disease Father   . Cancer Father        Bladder Cancer  . Heart attack Father   . Colon polyps Father   . Hypertension Mother   . Alzheimer's disease Mother   . Stroke Mother        3-4   . Diabetes Brother   . Heart disease Brother   . Arthritis Brother        bilateral knee replacement, back issues   . Pneumonia Sister   . GI problems Sister        esophogus stretched   . Crohn's disease Son     Objective: Office vital signs reviewed. BP (!) 144/72   Pulse (!) 59   Temp 98.6 F (37 C) (Temporal)   Ht 6\' 1"  (1.854 m)   Wt 214 lb (97.1 kg)   SpO2 99%   BMI 28.23 kg/m   Physical Examination:  General: Awake, alert, well nourished, No acute distress HEENT: Normal, sclera white, no carotid bruits Cardio: regular rate and rhythm, S1S2 heard, 1/6 SEM at LSB Pulm: clear to auscultation bilaterally, no wheezes, rhonchi or rales; normal work of breathing on room air Chest: Well-healed postsurgical midline scar noted. Extremities: warm, well perfused, No edema, cyanosis or clubbing; +2 pulses bilaterally  Assessment/ Plan: 78 y.o. male   1. Essential hypertension Mildly elevated given history of coronary artery disease but reports of control  blood pressure at home. He is under stress today so no changes made.  2. Hyperlipidemia LDL goal <70 Continue repatha  3. AAA (abdominal aortic aneurysm) without rupture ([facility]) Continue surveillance, BP control  4. Thrombocytopenia ([place]) Reviewed platelet level 148 today.  No bleeds  5. Family history of bladder cancer Urine micro without RBCs.  Continue with urology -  Urinalysis, Complete   Orders Placed This Encounter  Procedures  . Urinalysis, Complete   No orders of the defined types were placed in this encounter.    [name] Norlander, DO [place] [phone]

## 2019-02-01 ENCOUNTER — Other Ambulatory Visit: Payer: Self-pay | Admitting: Family Medicine

## 2019-02-19 ENCOUNTER — Other Ambulatory Visit: Payer: Self-pay | Admitting: Family Medicine

## 2019-02-19 MED ORDER — REPATHA SURECLICK 140 MG/ML ~~LOC~~ SOAJ: 140.0000 mL | SUBCUTANEOUS | 1 refills | Status: DC

## 2019-02-19 NOTE — Telephone Encounter (Signed)
Per patient he is wanting prior authorization for his Repatha for next year. Advised patient that a RF had been sent in and when he goes to fill them the pharmacy will send [place] the info we need to start a PA. Pt verbalized understanding

## 2019-03-23 ENCOUNTER — Ambulatory Visit: Payer: Medicare Other | Attending: Internal Medicine

## 2019-03-23 DIAGNOSIS — Z23 Encounter for immunization: Secondary | ICD-10-CM | POA: Insufficient documentation

## 2019-03-23 NOTE — Progress Notes (Signed)
   Covid-19 Vaccination Clinic  Name:  [name] [name]    MRN: [id number] DOB: 04-20-1940  03/23/2019  [name] was observed post Covid-19 immunization for 15 minutes without incidence. He was provided with Vaccine Information Sheet and instruction to access the V-Safe system.   [name] was instructed to call 911 with any severe reactions post vaccine: Marland Kitchen Difficulty breathing  . Swelling of your face and throat  . A fast heartbeat  . A bad rash all over your body  . Dizziness and weakness    Immunizations Administered    Name Date Dose VIS Date Route   Pfizer COVID-19 Vaccine 03/23/2019  5:07 PM 0.3 mL 02/12/2019 Intramuscular   Manufacturer: [place]   Lot: [id number]   [place]: [id number]

## 2019-03-25 ENCOUNTER — Telehealth: Payer: Self-pay | Admitting: *Deleted

## 2019-03-25 NOTE — Telephone Encounter (Signed)
A message was left, re: his follow up visit. 

## 2019-03-29 NOTE — Progress Notes (Signed)
Cardiology Office Note   Date:  03/30/2019   ID:  Jaycob, Ramakrishnan 1940-05-07, MRN [id number]  PCP:  [name], DO  Cardiologist:   Minus Breeding, MD   Chief Complaint  Patient presents with  . Coronary Artery Disease      History of Present Illness: [name] [name] is a 79 y.o. male who presents for evaluation of CAD/CABG. I sent him for an echo after the last appt and his EF was normal.  There were no significant abnormalities.   The patient denies any new symptoms such as chest discomfort, neck or arm discomfort. There has been no new shortness of breath, PND or orthopnea. There have been no reported palpitations, presyncope or syncatient has done well since I last saw him.  He had knee surgery.  He is golfing.     Past Medical History:  Diagnosis Date  . AAA (abdominal aortic aneurysm) ([place])    gets abd [place] every 3 years to watch  . CAD (coronary artery disease)   . Colon polyps   . Dyslipidemia   . Herniated disc   . Hiatal hernia   . HTN (hypertension)   . Hyperlipidemia   . Kidney stones    stones    Past Surgical History:  Procedure Laterality Date  . CORONARY ARTERY BYPASS GRAFT  2006   LIMA to LAD, SVG to RCA, SVG to circumflex, SVG to diagonal   . mole removed  2003  . right knee surgery  2008  . TONSILLECTOMY    . TOTAL KNEE ARTHROPLASTY Right 04/27/2018   Procedure: TOTAL KNEE ARTHROPLASTY RIGHT;  Surgeon: Gaynelle Arabian, MD;  Location: [facility];  Service: Orthopedics;  Laterality: Right;  66min     Current Outpatient Medications  Medication Sig Dispense Refill  . aspirin EC 81 MG tablet Take 81 mg by mouth daily.    . Evolocumab (REPATHA SURECLICK) XX123456 MG/ML SOAJ Inject 140 mLs into the skin every 14 (fourteen) days. 6 mL 1  . fluticasone (FLONASE) 50 MCG/ACT nasal spray Place 2 sprays into both nostrils daily as needed for allergies or rhinitis.    . metoprolol tartrate (LOPRESSOR) 25 MG tablet Take 25 mg by mouth 2 (two) times  daily.    . Multiple Vitamin (MULTIVITAMIN WITH MINERALS) TABS tablet Take 1 tablet by mouth daily.    . nitroGLYCERIN (NITROSTAT) 0.4 MG SL tablet Place 1 tablet (0.4 mg total) under the tongue every 5 (five) minutes as needed for chest pain. 25 tablet prn  . omeprazole (PRILOSEC) 20 MG capsule Take 20 mg by mouth daily.    Marland Kitchen OVER THE COUNTER MEDICATION Tumeric, B12 complex and  probio and vit d 5000 qd     No current facility-administered medications for this visit.    Allergies:   Levofloxacin, Crestor [rosuvastatin], and Vytorin [ezetimibe-simvastatin]    ROS:  Please see the history of present illness.   Otherwise, review of systems are positive for none.   All other systems are reviewed and negative.    PHYSICAL EXAM: VS:  BP 136/70   Pulse (!) 57   Ht 6\' 1"  (1.854 m)   Wt 216 lb (98 kg)   BMI 28.50 kg/m  , BMI Body mass index is 28.5 kg/m. GENERAL:  Well appearing NECK:  No jugular venous distention, waveform within normal limits, carotid upstroke brisk and symmetric, no bruits, no thyromegaly LUNGS:  Clear to auscultation bilaterally CHEST:  Unremarkable HEART:  PMI not displaced  or sustained,S1 and S2 within normal limits, no S3, no S4, no clicks, no rubs, 2 out of 6 apical and right-sided systolic murmur, no diastolic murmurs ABD:  Flat, positive bowel sounds normal in frequency in pitch, no bruits, no rebound, no guarding, no midline pulsatile mass, no hepatomegaly, no splenomegaly EXT:  2 plus pulses throughout, no edema, no cyanosis no clubbing   EKG:  EKG is ordered today. The ekg ordered today demonstrates sinus rhythm, rate 57, axis within normal limits, intervals within normal limits, no acute ST-T wave changes.   Recent Labs: 11/30/2018: ALT 16; BUN 17; Creatinine, Ser 0.87; Hemoglobin 14.9; Platelets 148; Potassium 4.2; Sodium 142    Lipid Panel    Component Value Date/Time   CHOL 123 11/30/2018 0806   CHOL 160 05/04/2013 0822   TRIG 105 11/30/2018 0806    TRIG 59 11/28/2016 0836   TRIG 92 05/04/2013 0822   HDL 52 11/30/2018 0806   HDL 54 11/28/2016 0836   HDL 55 05/04/2013 0822   CHOLHDL 2.4 11/30/2018 0806   LDLCALC 52 11/30/2018 0806   LDLCALC 149 (H) 08/05/2013 0851   LDLCALC 87 05/04/2013 0822      Wt Readings from Last 3 Encounters:  03/30/19 216 lb (98 kg)  12/02/18 214 lb (97.1 kg)  09/18/18 213 lb (96.6 kg)      Other studies Reviewed: Additional studies/ records that were reviewed today include: Labs. Review of the above records demonstrates:  Please see elsewhere in the note.     ASSESSMENT AND PLAN:  AAA:  3.3 cm in June of last year.    We will follow this up again in 2022.  CAD:   I sent him for a POET (Plain Old Exercise Treadmill) two years ago and this was negative.  He has had no new symptoms.  No change in therapy.  HTN:  The blood pressure is at target.  No change in therapy.   DYSLIPIDEMIA:   His LDL most recently was 52 with an HDL of 52.  He will remain on the meds as listed.   MURMUR:   He had some very mild TR.   Nochange in therapy or further imaging.  COVID EDUCATION: He had received his first dose.  Current medicines are reviewed at length with the patient today.  The patient does not have concerns regarding medicines.  The following changes have been made:  no change  Labs/ tests ordered today include: None  Orders Placed This Encounter  Procedures  . EKG 12-Lead     Disposition:   FU with me in 12 months.     Signed, Minus Breeding, MD  03/30/2019 5:32 PM    [place]

## 2019-03-30 ENCOUNTER — Encounter: Payer: Self-pay | Admitting: Cardiology

## 2019-03-30 ENCOUNTER — Ambulatory Visit (INDEPENDENT_AMBULATORY_CARE_PROVIDER_SITE_OTHER): Payer: Medicare Other | Admitting: Cardiology

## 2019-03-30 ENCOUNTER — Other Ambulatory Visit: Payer: Self-pay

## 2019-03-30 VITALS — BP 136/70 | HR 57 | Ht 73.0 in | Wt 216.0 lb

## 2019-03-30 DIAGNOSIS — E785 Hyperlipidemia, unspecified: Secondary | ICD-10-CM

## 2019-03-30 DIAGNOSIS — R011 Cardiac murmur, unspecified: Secondary | ICD-10-CM

## 2019-03-30 DIAGNOSIS — I714 Abdominal aortic aneurysm, without rupture, unspecified: Secondary | ICD-10-CM

## 2019-03-30 DIAGNOSIS — Z7189 Other specified counseling: Secondary | ICD-10-CM

## 2019-03-30 DIAGNOSIS — I10 Essential (primary) hypertension: Secondary | ICD-10-CM

## 2019-03-30 DIAGNOSIS — I2510 Atherosclerotic heart disease of native coronary artery without angina pectoris: Secondary | ICD-10-CM

## 2019-03-30 NOTE — Patient Instructions (Addendum)
Medication Instructions:  No Changes *If you need a refill on your cardiac medications before your next appointment, please call your pharmacy*  Lab Work: None   Testing/Procedures: None  Follow-Up: At [facility], you and your health needs are our priority.  As part of our continuing mission to provide you with exceptional heart care, we have created designated Provider Care Teams.  These Care Teams include your primary Cardiologist (physician) and Advanced Practice Providers (APPs -  Physician Assistants and Nurse Practitioners) who all work together to provide you with the care you need, when you need it.  Your next appointment:   1 year(s)  The format for your next appointment:   In Person  Provider:   Minus Breeding, MD

## 2019-04-05 ENCOUNTER — Other Ambulatory Visit: Payer: Self-pay | Admitting: *Deleted

## 2019-04-06 ENCOUNTER — Encounter: Payer: Self-pay | Admitting: Internal Medicine

## 2019-04-06 ENCOUNTER — Other Ambulatory Visit: Payer: Self-pay | Admitting: Family Medicine

## 2019-04-06 ENCOUNTER — Other Ambulatory Visit: Payer: Self-pay

## 2019-04-06 ENCOUNTER — Ambulatory Visit (INDEPENDENT_AMBULATORY_CARE_PROVIDER_SITE_OTHER): Payer: Medicare Other | Admitting: Internal Medicine

## 2019-04-06 DIAGNOSIS — I2510 Atherosclerotic heart disease of native coronary artery without angina pectoris: Secondary | ICD-10-CM

## 2019-04-06 DIAGNOSIS — I2583 Coronary atherosclerosis due to lipid rich plaque: Secondary | ICD-10-CM | POA: Diagnosis not present

## 2019-04-06 DIAGNOSIS — G4733 Obstructive sleep apnea (adult) (pediatric): Secondary | ICD-10-CM

## 2019-04-06 NOTE — Progress Notes (Signed)
HPI male never smoker followed for OSA, complicated by CAD/CABG, HBP, hiatal hernia Unattended Home Sleep Test 05/09/14- AHI 24.9/ hr, desat to 85%, body weight 208 pounds Unattended Home Sleep Test 08/02/15-AHI 11.3/hour, desaturation to 76%, body weight 212.5 pounds -----------------------------------------------------------  04/02/2018- 79 year old male never smoker followed for OSA, complicated by CAD/CABG, HBP, hiatal hernia CPAP auto 5-12/Advanced Download compliance 97%, AHI 1.2/ hr  -----OSA-doing well Echocardiogram attributed systolic murmur to mild TR/ Dr Percival Spanish Body weight today 221 pounds Download 97% compliance AHI 1.2/hour He is satisfied that he is better off w CPAP. Pending TKR. Denies other interval medical problems of concern for this visit.  04/06/19- 79 year old male never smoker followed for OSA, complicated by CAD/CABG, HBP, hiatal hernia, BPH,  CPAP auto 5-12/Adapt Download compliance 100%, AHI 1.5/ hr Recent ov note with cardiology reviewed- stable with nl EF and no changes in Rx. Had first Covid vax. -----f/u OSA. Patient stated his breathing is at his baseline.  Body weight today 217 lbs Tolerated R TKR without respiratory problems and now feels medically stable.  ROS-see HPI     + = positive Constitutional:   No-   weight loss, night sweats, fevers, chills, fatigue, lassitude. HEENT:   No-  headaches, difficulty swallowing, tooth/dental problems, sore throat,       No-  sneezing, itching, ear ache, nasal congestion, post nasal drip,  CV:  No-   chest pain, orthopnea, PND, swelling in lower extremities, anasarca,                                                    dizziness, palpitations Resp: No-   shortness of breath with exertion or at rest.              No-   productive cough,  No non-productive cough,  No- coughing up of blood.              No-   change in color of mucus.  No- wheezing.   Skin: No-   rash or lesions. GI:  No-   heartburn, indigestion,  abdominal pain, nausea, vomiting, diarrhea,                 change in bowel habits, loss of appetite GU: No-   dysuria, change in color of urine, no urgency or frequency.  No- flank pain. MS:  + joint pain or swelling.  No- decreased range of motion.  No- back pain. Neuro-     nothing unusual Psych:  No- change in mood or affect. No depression or anxiety.  No memory loss.  OBJ- Physical Exam   + = positive General- Alert, Oriented, Affect-appropriate, Distress- none acute, tall, not obese Skin- rash-none, lesions- none, excoriation- none Lymphadenopathy- none Head- atraumatic            Eyes- Gross vision intact, PERRLA, conjunctivae and secretions clear            Ears- Hearing, canals-normal            Nose- Clear, no-Septal dev, mucus, polyps, erosion, perforation             Throat- Mallampati II-III , mucosa clear , drainage- none, tonsils- atrophic Neck- flexible , trachea midline, no stridor , thyroid nl, carotid no bruit Chest - symmetrical excursion , unlabored  Heart/CV- RRR ,  Murmur+ TR , no gallop  , no rub, nl s1 s2                           - JVD- none , edema- none, stasis changes- none, varices- none           Lung- clear to P&A, wheeze- none, cough- none , dullness-none, rub- none           Chest wall-  Abd- tender-no, distended-no, bowel sounds-present, HSM- no Br/ Gen/ Rectal- Not done, not indicated Extrem- cyanosis- none, clubbing, none, atrophy- none, strength- nl, + brace on L knee Neuro- grossly intact to observation.

## 2019-04-06 NOTE — Assessment & Plan Note (Signed)
Benefits from CPAP with good compliance and control. Plan- continue auto 5-12 

## 2019-04-06 NOTE — Patient Instructions (Signed)
We can continue CPAP auto 5-12, mask of choice, humidifier, supplies, AirView/ card  Please call if we can help

## 2019-04-06 NOTE — Assessment & Plan Note (Signed)
Continues management by cardiology with no new concerns identified

## 2019-04-08 ENCOUNTER — Other Ambulatory Visit: Payer: Self-pay

## 2019-04-08 ENCOUNTER — Telehealth: Payer: Self-pay | Admitting: *Deleted

## 2019-04-08 ENCOUNTER — Other Ambulatory Visit: Payer: Medicare Other

## 2019-04-08 ENCOUNTER — Other Ambulatory Visit: Payer: Self-pay | Admitting: Family Medicine

## 2019-04-08 DIAGNOSIS — I714 Abdominal aortic aneurysm, without rupture, unspecified: Secondary | ICD-10-CM

## 2019-04-08 DIAGNOSIS — E785 Hyperlipidemia, unspecified: Secondary | ICD-10-CM

## 2019-04-08 DIAGNOSIS — I10 Essential (primary) hypertension: Secondary | ICD-10-CM

## 2019-04-08 NOTE — Telephone Encounter (Signed)
Pt is coming in today to get labs for next appt. Please put in orders

## 2019-04-08 NOTE — Telephone Encounter (Signed)
done

## 2019-04-09 LAB — LIPID PANEL
Chol/HDL Ratio: 2.5 ratio (ref 0.0–5.0)
Cholesterol, Total: 127 mg/dL (ref 100–199)
HDL: 51 mg/dL (ref >39)
LDL Chol Calc (NIH): 58 mg/dL (ref 0–99)
Triglycerides: 99 mg/dL (ref 0–149)
VLDL Cholesterol Cal: 18 mg/dL (ref 5–40)

## 2019-04-09 LAB — CMP14+EGFR
ALT: 17 IU/L (ref 0–44)
AST: 23 IU/L (ref 0–40)
Albumin/Globulin Ratio: 2.3 — ABNORMAL HIGH (ref 1.2–2.2)
Albumin: 4.6 g/dL (ref 3.7–4.7)
Alkaline Phosphatase: 67 IU/L (ref 39–117)
BUN/Creatinine Ratio: 20 (ref 10–24)
BUN: 18 mg/dL (ref 8–27)
Bilirubin Total: 0.6 mg/dL (ref 0.0–1.2)
CO2: 23 mmol/L (ref 20–29)
Calcium: 9.5 mg/dL (ref 8.6–10.2)
Chloride: 106 mmol/L (ref 96–106)
Creatinine, Ser: 0.89 mg/dL (ref 0.76–1.27)
GFR calc Af Amer: 96 mL/min/{1.73_m2} (ref >59)
GFR calc non Af Amer: 83 mL/min/{1.73_m2} (ref >59)
Globulin, Total: 2 g/dL (ref 1.5–4.5)
Glucose: 92 mg/dL (ref 65–99)
Potassium: 4.2 mmol/L (ref 3.5–5.2)
Sodium: 144 mmol/L (ref 134–144)
Total Protein: 6.6 g/dL (ref 6.0–8.5)

## 2019-04-09 LAB — TSH: TSH: 3.27 u[IU]/mL (ref 0.450–4.500)

## 2019-04-10 ENCOUNTER — Ambulatory Visit: Payer: Medicare Other

## 2019-04-12 ENCOUNTER — Other Ambulatory Visit: Payer: Self-pay

## 2019-04-13 ENCOUNTER — Other Ambulatory Visit: Payer: Self-pay

## 2019-04-13 ENCOUNTER — Ambulatory Visit (INDEPENDENT_AMBULATORY_CARE_PROVIDER_SITE_OTHER): Payer: Medicare Other | Admitting: Family Medicine

## 2019-04-13 ENCOUNTER — Encounter: Payer: Self-pay | Admitting: Family Medicine

## 2019-04-13 VITALS — BP 138/68 | HR 57 | Temp 97.9°F | Ht 73.0 in | Wt 217.0 lb

## 2019-04-13 DIAGNOSIS — Z8052 Family history of malignant neoplasm of bladder: Secondary | ICD-10-CM

## 2019-04-13 DIAGNOSIS — I10 Essential (primary) hypertension: Secondary | ICD-10-CM

## 2019-04-13 DIAGNOSIS — E785 Hyperlipidemia, unspecified: Secondary | ICD-10-CM

## 2019-04-13 DIAGNOSIS — H6123 Impacted cerumen, bilateral: Secondary | ICD-10-CM

## 2019-04-13 DIAGNOSIS — N401 Enlarged prostate with lower urinary tract symptoms: Secondary | ICD-10-CM

## 2019-04-13 DIAGNOSIS — R350 Frequency of micturition: Secondary | ICD-10-CM

## 2019-04-13 LAB — URINALYSIS
Bilirubin, UA: NEGATIVE
Glucose, UA: NEGATIVE
Ketones, UA: NEGATIVE
Leukocytes,UA: NEGATIVE
Nitrite, UA: NEGATIVE
Protein,UA: NEGATIVE
RBC, UA: NEGATIVE
Specific Gravity, UA: >1.03 — ABNORMAL HIGH (ref 1.005–1.030)
Urobilinogen, Ur: 0.2 mg/dL (ref 0.2–1.0)
pH, UA: 5.5 (ref 5.0–7.5)

## 2019-04-13 NOTE — Progress Notes (Signed)
Subjective: CC: Hyperlipidemia, CAD PCP: [name], DO [id number] [name] [name] is a 79 y.o. male presenting to clinic today for:  1.  Hypertension with hyperlipidemia, CAD Patient reports compliance with Repatha, Lopressor 25 mg twice daily.  He was contacted by the [place] about an adverse event.  He apparently did not allow that rubbing alcohol to dry down before administering the injection and has slight burning.  He has had no problems with this medication since that time.  In fact he notes that his cholesterol has been the best is ever been on this medication.  No chest pain, shortness of breath, dizziness, lower extremity edema or change in exercise tolerance.  He is closely followed by Dr. Percival Spanish.  2.  BPH Patient has history of urinary frequency.  He is followed by Dr. Jeffie Pollock intermittently.  He is not on any medications for his prostate because it has not been especially bothersome.  Additionally, he does note a family history in his father of bladder cancer for which he gets a urinalysis yearly to screen for blood in urine.  He has had direct visualization of the bladder previously by Dr. Jeffie Pollock.  Denies any gross hematuria, unplanned weight loss, new onset back pain.  3.  Ear itching/postnasal drip Patient reports irritation and itching of the right ear.  He does note muffled hearing on that side.  Sometimes he gets excessive earwax that needs irrigation and would like me to check this for him today.  He goes on to report some postnasal drip he is been experiencing.  He has Flonase on hand at home but has not been using any oral antihistamines.  Denies any overt sore throat.  No fevers.  No cough.   ROS: Per HPI  Allergies  Allergen Reactions  . Levofloxacin     Caused C- Diff  . Crestor [Rosuvastatin] Other (See Comments)    Liver problems  . Vytorin [Ezetimibe-Simvastatin] Other (See Comments)    Liver problems   Past Medical History:  Diagnosis Date    . AAA (abdominal aortic aneurysm) ([place])    gets abd [place] every 3 years to watch  . CAD (coronary artery disease)   . Colon polyps   . Dyslipidemia   . Herniated disc   . Hiatal hernia   . HTN (hypertension)   . Hyperlipidemia   . Kidney stones    stones    Current Outpatient Medications:  .  aspirin EC 81 MG tablet, Take 81 mg by mouth daily., Disp: , Rfl:  .  Evolocumab (REPATHA SURECLICK) XX123456 MG/ML SOAJ, Inject 140 mLs into the skin every 14 (fourteen) days., Disp: 6 mL, Rfl: 1 .  fluticasone (FLONASE) 50 MCG/ACT nasal spray, Place 2 sprays into both nostrils daily as needed for allergies or rhinitis., Disp: , Rfl:  .  meloxicam (MOBIC) 15 MG tablet, TAKE (1) TABLET DAILY AS DIRECTED., Disp: 30 tablet, Rfl: 0 .  metoprolol tartrate (LOPRESSOR) 25 MG tablet, Take 25 mg by mouth 2 (two) times daily., Disp: , Rfl:  .  Multiple Vitamin (MULTIVITAMIN WITH MINERALS) TABS tablet, Take 1 tablet by mouth daily., Disp: , Rfl:  .  nitroGLYCERIN (NITROSTAT) 0.4 MG SL tablet, Place 1 tablet (0.4 mg total) under the tongue every 5 (five) minutes as needed for chest pain., Disp: 25 tablet, Rfl: prn .  omeprazole (PRILOSEC) 20 MG capsule, Take 20 mg by mouth daily., Disp: , Rfl:  .  OVER THE COUNTER MEDICATION, Tumeric, B12 complex and  probio and vit d 5000 qd, Disp: , Rfl:  Social History   Socioeconomic History  . Marital status: Married    Spouse name: Constance Holster  . Number of children: 2  . Years of education: Not on file  . Highest education level: Bachelor's degree (e.g., BA, AB, BS)  Occupational History  . Occupation: Armed forces technical officer for [employer]  Tobacco Use  . Smoking status: Never Smoker  . Smokeless tobacco: Never Used  . Tobacco comment: does not smoke   Substance and Sexual Activity  . Alcohol use: Yes    Alcohol/week: 1.0 standard drinks    Types: 1 Glasses of wine per week    Comment: with meal  . Drug use: No  . Sexual activity: Yes  Other Topics Concern  .  Not on file  Social History Narrative   Married 37+ years, 2 adult children and one granddaughter. Engineer, building services at [employer].    Social Determinants of Health   Financial Resource Strain:   . Difficulty of Paying Living Expenses: Not on file  Food Insecurity:   . Worried About Charity fundraiser in the Last Year: Not on file  . Ran Out of Food in the Last Year: Not on file  Transportation Needs:   . Lack of Transportation (Medical): Not on file  . Lack of Transportation (Non-Medical): Not on file  Physical Activity:   . Days of Exercise per Week: Not on file  . Minutes of Exercise per Session: Not on file  Stress:   . Feeling of Stress : Not on file  Social Connections:   . Frequency of Communication with Friends and Family: Not on file  . Frequency of Social Gatherings with Friends and Family: Not on file  . Attends Religious Services: Not on file  . Active Member of Clubs or Organizations: Not on file  . Attends Archivist Meetings: Not on file  . Marital Status: Not on file  Intimate Partner Violence:   . Fear of Current or Ex-Partner: Not on file  . Emotionally Abused: Not on file  . Physically Abused: Not on file  . Sexually Abused: Not on file   Family History  Problem Relation Age of Onset  . Heart disease Father   . Cancer Father        Bladder Cancer  . Heart attack Father   . Colon polyps Father   . Hypertension Mother   . Alzheimer's disease Mother   . Stroke Mother        3-4   . Diabetes Brother   . Heart disease Brother   . Arthritis Brother        bilateral knee replacement, back issues   . Pneumonia Sister   . GI problems Sister        esophogus stretched   . Crohn's disease Son     Objective: Office vital signs reviewed. BP 138/68   Pulse (!) 57   Temp 97.9 F (36.6 C) (Temporal)   Ht 6\' 1"  (1.854 m)   Wt 217 lb (98.4 kg)   SpO2 99%   BMI 28.63 kg/m   Physical Examination:  General: Awake, alert, well nourished, No  acute distress HEENT: Normal; sclera white.  Moist mucous membranes.  Mild cobblestone appearance to the oropharynx noted.  TMs were occluded bilaterally with cerumen.  After irrigation TMs intact bilaterally without evidence of infection.  Scant dry skin noted in the external auditory canal bilaterally Cardio: regular rate and  rhythm, S1S2 heard, no murmurs appreciated Pulm: clear to auscultation bilaterally, no wheezes, rhonchi or rales; normal work of breathing on room air Extremities: warm, well perfused, No edema, cyanosis or clubbing; +2 pulses bilaterally MSK: Normal gait and station Skin: dry; intact; no rashes or lesions  Assessment/ Plan: 79 y.o. male   1. Essential hypertension Controlled.  Continue current regimen  2. Hyperlipidemia LDL goal <70 Well-controlled with current regimen.  I have responded to the [employer] request with regards to [place].  It sounds like this was simply a small area in administration by not letting the alcohol to dry down.  He has not had any problems with it since.  3. Bilateral impacted cerumen Irrigated  4. Family history of bladder cancer Screening urinalysis without evidence of blood.  I encouraged him to continue following up with his urologist. - Urinalysis  5. Benign prostatic hyperplasia with urinary frequency As above.   Orders Placed This Encounter  Procedures  . Urinalysis   No orders of the defined types were placed in this encounter.    [name], DO [place] [phone]

## 2019-04-13 NOTE — Patient Instructions (Signed)
Your LDL (bad cholesterol) is at goal <70 (it was 58).  This is great.  Continue your medications.  Ok to see me back in 4-6 months.     Lipid Profile Test Why am I having this test? The lipid profile test can be used to help evaluate your risk for developing heart disease. The test is also used to monitor your levels during treatment for high cholesterol to see if you are reaching your goals. What is being tested? A lipid profile measures the following:  Total cholesterol. Cholesterol is a waxy, fat-like substance in your blood. If your total cholesterol level is high, this can increase your risk for heart disease.  High-density lipoprotein (HDL). This is known as the good cholesterol. Having high levels of HDL decreases your risk for heart disease. Your HDL level may be low if you smoke or do not get enough exercise.  Low-density lipoprotein (LDL). This is known as the bad cholesterol. This type causes plaque to build up in your arteries. Having a low level of LDL is best. Having high levels of LDL increases your risk for heart disease.  Cholesterol to HDL ratio. This is calculated by dividing your total cholesterol by your HDL cholesterol. The ratio is used by health care providers to determine your risk for heart disease. A low ratio is best.  Triglycerides. These are fats that your body can store or burn for energy. Low levels are best. Having high levels of triglycerides increases your risk for heart disease. What kind of sample is taken?  A blood sample is required for this test. It is usually collected by inserting a needle into a blood vessel. How do I prepare for this test? Do not drink alcohol starting at least 24 hours before your test. Follow any instructions from your health care provider about dietary restrictions before your test. Do not eat or drink anything other than water after midnight on the night before the test, or as told by your health care provider. Tell a health  care provider about:  All medicines you are taking, including vitamins, herbs, eye drops, creams, and over-the-counter medicines.  Any medical conditions you have.  Whether you are pregnant or may be pregnant. How are the results reported? Your test results will be reported as values that indicate your cholesterol and triglyceride levels. Your health care provider will compare your results to normal ranges that were established after testing a large group of people (reference ranges). Reference ranges may vary among labs and hospitals. For this test, common reference ranges are: Total cholesterol  Adult or elderly: less than 200 mg/dL.  Child: 120-200 mg/dL.  Infant: 70-175 mg/dL.  Newborn: 53-135 mg/dL. HDL  Male: greater than 45 mg/dL.  Male: greater than 55 mg/dL. HDL reference values based on your risk for heart disease:  Low risk for heart disease: ? Male: 60 mg/dL. ? Male: 70 mg/dL.  Moderate risk for heart disease: ? Male: 45 mg/dL. ? Male: 55 mg/dL.  High risk for heart disease: ? Male: 25 mg/dL. ? Male: 35 mg/dL. LDL  Adults: Your health care provider will determine a target level for LDL based on your risk for heart disease. ? If you are at low risk, your LDL should be 130 mg/dL or less. ? If you are at moderate risk, your LDL should be 100 mg/dL or less. ? If you are at high risk, your LDL should be 70 mg/dL or less.  Children: less than 110 mg/dL. Cholesterol to  HDL ratio Reference values based on your risk for heart disease:  Risk that is half the average risk: ? Male: 3.4. ? Male: 3.3.  Average risk: ? Male: 5.0. ? Male: 4.4.  Risk that is two times average (moderate risk): ? Male: 10.0. ? Male: 7.0.  Risk that is three times average (high risk): ? Male: 24.0. ? Male: 11.0. Triglycerides  Adult or elderly: ? Male: 40-160 mg/dL. ? Male: 35-135 mg/dL.  Children 65-10 years old: ? Male: 40-163 mg/dL. ? Male: 40-128  mg/dL.  Children 20-27 years old: ? Male: 36-138 mg/dL. ? Male: 41-138 mg/dL.  Children 47-56 years old: ? Male: 31-108 mg/dL. ? Male: 35-114 mg/dL.  Children 2-79 years old: ? Male: 30-86 mg/dL. ? Male: 32-99 mg/dL. Triglycerides should be less than 400 mg/dL even when you are not fasting. What do the results mean? Results that are within the reference ranges are considered normal. Total cholesterol, LDL, and triglyceride levels that are higher than the reference ranges can mean that you have an increased risk for heart disease. An HDL level that is lower than the reference range can also indicate an increased risk. Talk with your health care provider about what your results mean. Questions to ask your health care provider Ask your health care provider, or the department that is doing the test:  When will my results be ready?  How will I get my results?  What are my treatment options?  What other tests do I need?  What are my next steps? Summary  The lipid profile test can be used to help predict the likelihood that you will develop heart disease. It can also help monitor your cholesterol levels during treatment.  A lipid profile measures your total cholesterol, high-density lipoprotein (HDL), low-density lipoprotein (LDL), cholesterol to HDL ratio, and triglycerides.  Total cholesterol, LDL, and triglyceride levels that are higher than the reference ranges can indicate an increased risk for heart disease.  An HDL level that is lower than the reference range can indicate an increased risk for heart disease.  Talk with your health care provider about what your results mean. This information is not intended to replace advice given to you by your health care provider. Make sure you discuss any questions you have with your health care provider. Document Revised: 11/25/2016 Document Reviewed: 11/25/2016 Elsevier Patient Education  2020 [employer].

## 2019-04-15 ENCOUNTER — Ambulatory Visit: Payer: Medicare Other | Attending: Internal Medicine

## 2019-04-15 DIAGNOSIS — Z23 Encounter for immunization: Secondary | ICD-10-CM | POA: Insufficient documentation

## 2019-04-15 NOTE — Progress Notes (Signed)
   Covid-19 Vaccination Clinic  Name:  Naron Coburn    MRN: [id number] DOB: 07-Dec-1940  04/15/2019  Mr. Angevine was observed post Covid-19 immunization for 15 minutes without incidence. He was provided with Vaccine Information Sheet and instruction to access the V-Safe system.   Mr. Portman was instructed to call 911 with any severe reactions post vaccine: Marland Kitchen Difficulty breathing  . Swelling of your face and throat  . A fast heartbeat  . A bad rash all over your body  . Dizziness and weakness    Immunizations Administered    Name Date Dose VIS Date Route   Pfizer COVID-19 Vaccine 04/15/2019  3:28 PM 0.3 mL 02/12/2019 Intramuscular   Manufacturer: [place]   Lot: [id number]   [place]: [id number]

## 2019-04-26 ENCOUNTER — Telehealth: Payer: Self-pay | Admitting: *Deleted

## 2019-04-26 NOTE — Telephone Encounter (Signed)
   [facility] Pre-operative Risk Assessment    Request for surgical clearance:  1. What type of surgery is being performed? LEFT TOTAL KNEE ARTHROPLASTY   2. When is this surgery scheduled? 05/31/19   3. What type of clearance is required (medical clearance vs. Pharmacy clearance to hold med vs. Both)? MEDICAL  4. Are there any medications that need to be held prior to surgery and how long? ASA   5. Practice name and name of physician performing surgery? [facility]; DR. FRANK ALUISIO  6. What is your office phone number [phone]    7.   What is your office fax number [phone]  8.   Anesthesia type (None, local, MAC, general) ? CHOICE   Julaine Hua 04/26/2019, 5:07 PM  _________________________________________________________________   (provider comments below)

## 2019-04-27 NOTE — Telephone Encounter (Signed)
If necessary for knee surgery he could hold ASA.

## 2019-04-27 NOTE — Telephone Encounter (Signed)
Dr. Percival Spanish, you recently saw Mr [place] with hx CABG and doing well.  Can he hold the ASA for 5 days for total Knee?   Thanks.  Mickel Baas

## 2019-04-27 NOTE — Telephone Encounter (Signed)
   Primary Cardiologist: Minus Breeding, MD  Chart reviewed as part of pre-operative protocol coverage. Patient was contacted 04/27/2019 in reference to pre-operative risk assessment for pending surgery as outlined below.  [name] [name] was last seen on 03/30/19 by Dr. Percival Spanish for CAD with CABG in 2006 and has been stable since that time.  Occ. Bradycardia on beta blocker..  Since that day, [name] [name] has done well with ability to reach 4 METS of activity without angina.   Therefore, based on ACC/AHA guidelines, the patient would be at acceptable risk for the planned procedure without further cardiovascular testing.   If asprin needs to be held he could do so for 5 days.    I will route this recommendation to the requesting party via Epic fax function and remove from pre-op pool.  Please call with questions.  Cecilie Kicks, NP 04/27/2019, 1:27 PM

## 2019-05-02 ENCOUNTER — Ambulatory Visit: Payer: Medicare Other

## 2019-05-12 NOTE — H&P (Signed)
TOTAL KNEE ADMISSION H&P  Patient is being admitted for left total knee arthroplasty.  Subjective:  Chief Complaint: Left knee pain.  HPI: [name] [name], 79 y.o. male has a history of pain and functional disability in the left knee due to arthritis and has failed non-surgical conservative treatments for greater than 12 weeks to include activity modification. Onset of symptoms was gradual, starting several years ago with gradually worsening course since that time. The patient noted no past surgery on the left knee.  Patient currently rates pain in the left knee at 7 out of 10 with activity. Patient has worsening of pain with activity and weight bearing, pain that interferes with activities of daily living and crepitus. Patient has evidence of bone-on-bone osteoarthritis in the medial and patellofemoral compartments of the left knee by imaging studies. There is no active infection.  Patient Active Problem List   Diagnosis Date Noted  . Osteoarthritis, knee 04/28/2018  . OA (osteoarthritis) of knee 04/27/2018  . Osteoarthritis of knee 05/08/2017  . Tinnitus of both ears 09/10/2016  . Ventral hernia without obstruction or gangrene 03/28/2016  . AAA (abdominal aortic aneurysm) without rupture ([place]) 07/20/2015  . Obstructive sleep apnea 03/17/2014  . Erectile dysfunction due to arterial insufficiency 01/12/2014  . Thrombocytopenia ([place]) 12/30/2013  . Allergic rhinitis 10/01/2012  . BPH (benign prostatic hyperplasia) 10/01/2012  . PVC's (premature ventricular contractions) 09/01/2009  . COLONIC POLYPS 04/14/2009  . Hyperlipidemia LDL goal <70 04/14/2009  . Essential hypertension 04/14/2009  . Coronary atherosclerosis 04/14/2009  . HIATAL HERNIA 04/14/2009  . HERNIATED DISC 04/14/2009    Past Medical History:  Diagnosis Date  . AAA (abdominal aortic aneurysm) ([place])    gets abd [place] every 3 years to watch  . CAD (coronary artery disease)   . Colon polyps   . Dyslipidemia   .  Herniated disc   . Hiatal hernia   . HTN (hypertension)   . Hyperlipidemia   . Kidney stones    stones    Past Surgical History:  Procedure Laterality Date  . CORONARY ARTERY BYPASS GRAFT  2006   LIMA to LAD, SVG to RCA, SVG to circumflex, SVG to diagonal   . mole removed  2003  . right knee surgery  2008  . TONSILLECTOMY    . TOTAL KNEE ARTHROPLASTY Right 04/27/2018   Procedure: TOTAL KNEE ARTHROPLASTY RIGHT;  Surgeon: Gaynelle Arabian, MD;  Location: [facility];  Service: Orthopedics;  Laterality: Right;  28min    Prior to Admission medications   Medication Sig Start Date End Date Taking? Authorizing Provider  aspirin EC 81 MG tablet Take 81 mg by mouth daily.    [provider]  Evolocumab (REPATHA SURECLICK) [id number] MG/ML SOAJ Inject 140 mLs into the skin every 14 (fourteen) days. 02/19/19   [name], DO  fluticasone (FLONASE) 50 MCG/ACT nasal spray Place 2 sprays into both nostrils daily as needed for allergies or rhinitis.    [provider]  meloxicam (MOBIC) 15 MG tablet TAKE (1) TABLET DAILY AS DIRECTED. 04/06/19   Ronnie Doss M, DO  metoprolol tartrate (LOPRESSOR) 25 MG tablet Take 25 mg by mouth 2 (two) times daily.    [provider]  Multiple Vitamin (MULTIVITAMIN WITH MINERALS) TABS tablet Take 1 tablet by mouth daily.    [provider]  nitroGLYCERIN (NITROSTAT) 0.4 MG SL tablet Place 1 tablet (0.4 mg total) under the tongue every 5 (five) minutes as needed for chest pain. 07/17/18 03/30/19  Laurance Flatten,  Estella Husk, MD  omeprazole (PRILOSEC) 20 MG capsule Take 20 mg by mouth daily.    [provider]  OVER THE COUNTER MEDICATION Tumeric, B12 complex and  probio and vit d 5000 qd    [provider]    Allergies  Allergen Reactions  . Levofloxacin     Caused C- Diff  . Crestor [Rosuvastatin] Other (See Comments)    Liver problems  . Vytorin [Ezetimibe-Simvastatin] Other (See Comments)    Liver problems    Social  History   Socioeconomic History  . Marital status: Married    Spouse name: Constance Holster  . Number of children: 2  . Years of education: Not on file  . Highest education level: Bachelor's degree (e.g., BA, AB, BS)  Occupational History  . Occupation: Armed forces technical officer for [employer]  Tobacco Use  . Smoking status: Never Smoker  . Smokeless tobacco: Never Used  . Tobacco comment: does not smoke   Substance and Sexual Activity  . Alcohol use: Yes    Alcohol/week: 1.0 standard drinks    Types: 1 Glasses of wine per week    Comment: with meal  . Drug use: No  . Sexual activity: Yes  Other Topics Concern  . Not on file  Social History Narrative   Married 37+ years, 2 adult children and one granddaughter. Engineer, building services at [employer].    Social Determinants of Health   Financial Resource Strain:   . Difficulty of Paying Living Expenses: Not on file  Food Insecurity:   . Worried About Charity fundraiser in the Last Year: Not on file  . Ran Out of Food in the Last Year: Not on file  Transportation Needs:   . Lack of Transportation (Medical): Not on file  . Lack of Transportation (Non-Medical): Not on file  Physical Activity:   . Days of Exercise per Week: Not on file  . Minutes of Exercise per Session: Not on file  Stress:   . Feeling of Stress : Not on file  Social Connections:   . Frequency of Communication with Friends and Family: Not on file  . Frequency of Social Gatherings with Friends and Family: Not on file  . Attends Religious Services: Not on file  . Active Member of Clubs or Organizations: Not on file  . Attends Archivist Meetings: Not on file  . Marital Status: Not on file  Intimate Partner Violence:   . Fear of Current or Ex-Partner: Not on file  . Emotionally Abused: Not on file  . Physically Abused: Not on file  . Sexually Abused: Not on file      Tobacco Use: Low Risk   . Smoking Tobacco Use: Never Smoker  . Smokeless Tobacco Use:  Never Used   Social History   Substance and Sexual Activity  Alcohol Use Yes  . Alcohol/week: 1.0 standard drinks  . Types: 1 Glasses of wine per week   Comment: with meal    Family History  Problem Relation Age of Onset  . Heart disease Father   . Cancer Father        Bladder Cancer  . Heart attack Father   . Colon polyps Father   . Hypertension Mother   . Alzheimer's disease Mother   . Stroke Mother        3-4   . Diabetes Brother   . Heart disease Brother   . Arthritis Brother        bilateral knee replacement,  back issues   . Pneumonia Sister   . GI problems Sister        esophogus stretched   . Crohn's disease Son     Review of Systems  Constitutional: Negative for chills and fever.  HENT: Negative for congestion, sore throat and tinnitus.   Eyes: Negative for double vision, photophobia and pain.  Respiratory: Negative for cough, shortness of breath and wheezing.   Cardiovascular: Negative for chest pain, palpitations and orthopnea.  Gastrointestinal: Negative for heartburn, nausea and vomiting.  Genitourinary: Negative for dysuria, frequency and urgency.  Musculoskeletal: Positive for joint pain.  Neurological: Negative for dizziness, weakness and headaches.    Objective:  Physical Exam: Well nourished and well developed.  General: Alert and oriented x3, cooperative and pleasant, no acute distress.  Head: normocephalic, atraumatic, neck supple.  Eyes: EOMI.  Respiratory: breath sounds clear in all fields, no wheezing, rales, or rhonchi. Cardiovascular: Regular rate and rhythm, no murmurs, gallops or rubs.  Abdomen: non-tender to palpation and soft, normoactive bowel sounds. Musculoskeletal:  Left knee exam:  Slight varus deformity. Significant tenderness medially.  No lateral tenderness. Range of motion is from 0 to 130 degrees.  Marked crepitus with range of motion. Knee is stable.  Calves soft and nontender. Motor function intact in LE. Strength  5/5 LE bilaterally. Neuro: Distal pulses 2+. Sensation to light touch intact in LE.  Vital signs in last 24 hours: Blood pressure: 132/86 mmHg  Imaging Review Plain radiographs demonstrate severe degenerative joint disease of the left knee. The overall alignment is mild varus. The bone quality appears to be adequate for age and reported activity level.  Assessment/Plan:  End stage arthritis, left knee   The patient history, physical examination, clinical judgment of the provider and imaging studies are consistent with end stage degenerative joint disease of the left knee and total knee arthroplasty is deemed medically necessary. The treatment options including medical management, injection therapy arthroscopy and arthroplasty were discussed at length. The risks and benefits of total knee arthroplasty were presented and reviewed. The risks due to aseptic loosening, infection, stiffness, patella tracking problems, thromboembolic complications and other imponderables were discussed. The patient acknowledged the explanation, agreed to proceed with the plan and consent was signed. Patient is being admitted for inpatient treatment for surgery, pain control, PT, OT, prophylactic antibiotics, VTE prophylaxis, progressive ambulation and ADLs and discharge planning. The patient is planning to be discharged home.  Anticipated LOS equal to or greater than 2 midnights due to - Age 46 and older with one or more of the following:  - Obesity  - Expected need for hospital services (PT, OT, Nursing) required for safe  discharge  - Anticipated need for postoperative skilled nursing care or inpatient rehab  - Active co-morbidities: Coronary Artery Disease OR   - Unanticipated findings during/Post Surgery: None  - Patient is a high risk of re-admission due to: None  Therapy Plans: Outpatient therapy at [facility]) Disposition: Home with wife Planned DVT Prophylaxis: Xarelto 10 mg daily (cardiac hx) DME  Needed: None PCP: Ronnie Doss, DO (clearance received) Cardiologist: Minus Breeding, MD (clearance received) TXA: IV Allergies: Levaquin Anesthesia Concerns: None BMI: 29.4 Last HgbA1c:   Other: Hx of CAD with CABG in 2006  - Patient was instructed on what medications to stop prior to surgery. - Follow-up visit in 2 weeks with Dr. Wynelle Link - Begin physical therapy following surgery - Pre-operative lab work as pre-surgical testing - Prescriptions will be provided in hospital at  time of discharge  Theresa Duty, PA-C Orthopedic Surgery [facility] [place]

## 2019-05-19 ENCOUNTER — Encounter (HOSPITAL_COMMUNITY): Payer: Self-pay

## 2019-05-19 NOTE — Patient Instructions (Addendum)
DUE TO COVID-19 ONLY TWO VISITORS IS ALLOWED TO COME WITH YOU AND STAY IN THE WAITING ROOM ONLY DURING PRE OP AND PROCEDURE. THE ONE VISITOR MAY VISIT WITH YOU IN YOUR PRIVATE ROOM DURING VISITING HOURS ONLY!!   COVID SWAB TESTING MUST BE COMPLETED ON:  Thursday, May 27, 2019 at 11:00AM    [address], [place], [place] - the short stay covered drive at [facility] (Use the [employer] entrance to [facility] next to [facility].) (Must self quarantine after testing. Follow instructions on handout.)       Your procedure is scheduled on: Monday, May 31, 2019   Report to [facility] Main  Entrance    Report to admitting at 9:00 AM   Call this number if you have problems the morning of surgery [phone]   Bring CPAP mask and tubing day of surgery   Do not eat food:After Midnight.   May have liquids until 8:30 AM day of surgery   CLEAR LIQUID DIET  Foods Allowed                                                                     Foods Excluded  Water, Black Coffee and tea, regular and decaf                             liquids that you cannot  Plain Jell-O in any flavor  (No red)                                           see through such as: Fruit ices (not with fruit pulp)                                     milk, soups, orange juice  Iced Popsicles (No red)                                    All solid food Carbonated beverages, regular and diet                                    Apple juices Sports drinks like Gatorade (No red) Lightly seasoned clear broth or consume(fat free) Sugar, honey syrup  Sample Menu Breakfast                                Lunch                                     Supper Cranberry juice                    Beef broth  Chicken broth Jell-O                                     Grape juice                           Apple juice Coffee or tea                        Jell-O                                       Popsicle                                                Coffee or tea                        Coffee or tea   Complete one Ensure drink the morning of surgery at 8:30AM the day of surgery.   Oral Hygiene is also important to reduce your risk of infection.                                    Remember - BRUSH YOUR TEETH THE MORNING OF SURGERY WITH YOUR REGULAR TOOTHPASTE   Do NOT smoke after Midnight   Take these medicines the morning of surgery with A SIP OF WATER: Metoprolol, Omeprazole                               You may not have any metal on your body including jewelry, and body piercings             Do not wear lotions, powders, perfumes/cologne, or deodorant                           Men may shave face and neck.   Do not bring valuables to the hospital. [place].   Contacts, dentures or bridgework may not be worn into surgery.   Bring small overnight bag day of surgery.    Patients discharged the day of surgery will not be allowed to drive home.   Special Instructions: Bring a copy of your healthcare power of attorney and living will documents         the day of surgery if you haven't scanned them in before.              Please read over the following fact sheets you were given:  [facility] - Preparing for Surgery Before surgery, you can play an important role.  Because skin is not sterile, your skin needs to be as free of germs as possible.  You can reduce the number of germs on your skin by washing with CHG (chlorahexidine gluconate) soap before surgery.  CHG is an antiseptic cleaner which kills germs and bonds with the skin to  continue killing germs even after washing. Please DO NOT use if you have an allergy to CHG or antibacterial soaps.  If your skin becomes reddened/irritated stop using the CHG and inform your nurse when you arrive at Short Stay. Do not shave (including legs and underarms) for at least 48  hours prior to the first CHG shower.  You may shave your face/neck.  Please follow these instructions carefully:  1.  Shower with CHG Soap the night before surgery and the  morning of surgery.  2.  If you choose to wash your hair, wash your hair first as usual with your normal  shampoo.  3.  After you shampoo, rinse your hair and body thoroughly to remove the shampoo.                             4.  Use CHG as you would any other liquid soap.  You can apply chg directly to the skin and wash.  Gently with a scrungie or clean washcloth.  5.  Apply the CHG Soap to your body ONLY FROM THE NECK DOWN.   Do   not use on face/ open                           Wound or open sores. Avoid contact with eyes, ears mouth and   genitals (private parts).                       Wash face,  Genitals (private parts) with your normal soap.             6.  Wash thoroughly, paying special attention to the area where your    surgery  will be performed.  7.  Thoroughly rinse your body with warm water from the neck down.  8.  DO NOT shower/wash with your normal soap after using and rinsing off the CHG Soap.                9.  Pat yourself dry with a clean towel.            10.  Wear clean pajamas.            11.  Place clean sheets on your bed the night of your first shower and do not  sleep with pets. Day of Surgery : Do not apply any lotions/deodorants the morning of surgery.  Please wear clean clothes to the hospital/surgery center.  FAILURE TO FOLLOW THESE INSTRUCTIONS MAY RESULT IN THE CANCELLATION OF YOUR SURGERY  PATIENT SIGNATURE_________________________________  NURSE SIGNATURE__________________________________  ________________________________________________________________________   [name] [name]  An incentive spirometer is a tool that can help keep your lungs clear and active. This tool measures how well you are filling your lungs with each breath. Taking long deep breaths may help reverse or  decrease the chance of developing breathing (pulmonary) problems (especially infection) following:  A long period of time when you are unable to move or be active. BEFORE THE PROCEDURE   If the spirometer includes an indicator to show your best effort, your nurse or respiratory therapist will set it to a desired goal.  If possible, sit up straight or lean slightly forward. Try not to slouch.  Hold the incentive spirometer in an upright position. INSTRUCTIONS FOR USE  1. Sit on the edge of your bed if possible, or sit up as far  as you can in bed or on a chair. 2. Hold the incentive spirometer in an upright position. 3. Breathe out normally. 4. Place the mouthpiece in your mouth and seal your lips tightly around it. 5. Breathe in slowly and as deeply as possible, raising the piston or the ball toward the top of the column. 6. Hold your breath for 3-5 seconds or for as long as possible. Allow the piston or ball to fall to the bottom of the column. 7. Remove the mouthpiece from your mouth and breathe out normally. 8. Rest for a few seconds and repeat Steps 1 through 7 at least 10 times every 1-2 hours when you are awake. Take your time and take a few normal breaths between deep breaths. 9. The spirometer may include an indicator to show your best effort. Use the indicator as a goal to work toward during each repetition. 10. After each set of 10 deep breaths, practice coughing to be sure your lungs are clear. If you have an incision (the cut made at the time of surgery), support your incision when coughing by placing a pillow or rolled up towels firmly against it. Once you are able to get out of bed, walk around indoors and cough well. You may stop using the incentive spirometer when instructed by your caregiver.  RISKS AND COMPLICATIONS  Take your time so you do not get dizzy or light-headed.  If you are in pain, you may need to take or ask for pain medication before doing incentive spirometry.  It is harder to take a deep breath if you are having pain. AFTER USE  Rest and breathe slowly and easily.  It can be helpful to keep track of a log of your progress. Your caregiver can provide you with a simple table to help with this. If you are using the spirometer at home, follow these instructions: [place] IF:   You are having difficultly using the spirometer.  You have trouble using the spirometer as often as instructed.  Your pain medication is not giving enough relief while using the spirometer.  You develop fever of 100.5 F (38.1 C) or higher. SEEK IMMEDIATE MEDICAL CARE IF:   You cough up bloody sputum that had not been present before.  You develop fever of 102 F (38.9 C) or greater.  You develop worsening pain at or near the incision site. MAKE SURE YOU:   Understand these instructions.  Will watch your condition.  Will get help right away if you are not doing well or get worse. Document Released: 07/01/2006 Document Revised: 05/13/2011 Document Reviewed: 09/01/2006 ExitCare Patient Information 2014 ExitCare, [place].   ________________________________________________________________________  WHAT IS A BLOOD TRANSFUSION? Blood Transfusion Information  A transfusion is the replacement of blood or some of its parts. Blood is made up of multiple cells which provide different functions.  Red blood cells carry oxygen and are used for blood loss replacement.  White blood cells fight against infection.  Platelets control bleeding.  Plasma helps clot blood.  Other blood products are available for specialized needs, such as hemophilia or other clotting disorders. BEFORE THE TRANSFUSION  Who gives blood for transfusions?   Healthy volunteers who are fully evaluated to make sure their blood is safe. This is blood bank blood. Transfusion therapy is the safest it has ever been in the practice of medicine. Before blood is taken from a donor, a complete  history is taken to make sure that person has no history of  diseases nor engages in risky social behavior (examples are intravenous drug use or sexual activity with multiple partners). The donor's travel history is screened to minimize risk of transmitting infections, such as malaria. The donated blood is tested for signs of infectious diseases, such as HIV and hepatitis. The blood is then tested to be sure it is compatible with you in order to minimize the chance of a transfusion reaction. If you or a relative donates blood, this is often done in anticipation of surgery and is not appropriate for emergency situations. It takes many days to process the donated blood. RISKS AND COMPLICATIONS Although transfusion therapy is very safe and saves many lives, the main dangers of transfusion include:   Getting an infectious disease.  Developing a transfusion reaction. This is an allergic reaction to something in the blood you were given. Every precaution is taken to prevent this. The decision to have a blood transfusion has been considered carefully by your caregiver before blood is given. Blood is not given unless the benefits outweigh the risks. AFTER THE TRANSFUSION  Right after receiving a blood transfusion, you will usually feel much better and more energetic. This is especially true if your red blood cells have gotten low (anemic). The transfusion raises the level of the red blood cells which carry oxygen, and this usually causes an energy increase.  The nurse administering the transfusion will monitor you carefully for complications. HOME CARE INSTRUCTIONS  No special instructions are needed after a transfusion. You may find your energy is better. Speak with your caregiver about any limitations on activity for underlying diseases you may have. SEEK MEDICAL CARE IF:   Your condition is not improving after your transfusion.  You develop redness or irritation at the intravenous (IV) site. SEEK  IMMEDIATE MEDICAL CARE IF:  Any of the following symptoms occur over the next 12 hours:  Shaking chills.  You have a temperature by mouth above 102 F (38.9 C), not controlled by medicine.  Chest, back, or muscle pain.  People around you feel you are not acting correctly or are confused.  Shortness of breath or difficulty breathing.  Dizziness and fainting.  You get a rash or develop hives.  You have a decrease in urine output.  Your urine turns a dark color or changes to pink, red, or brown. Any of the following symptoms occur over the next 10 days:  You have a temperature by mouth above 102 F (38.9 C), not controlled by medicine.  Shortness of breath.  Weakness after normal activity.  The white part of the eye turns yellow (jaundice).  You have a decrease in the amount of urine or are urinating less often.  Your urine turns a dark color or changes to pink, red, or brown. Document Released: 02/16/2000 Document Revised: 05/13/2011 Document Reviewed: 10/05/2007 [facility] Patient Information 2014 [place], [place].  _______________________________________________________________________

## 2019-05-22 ENCOUNTER — Other Ambulatory Visit: Payer: Self-pay | Admitting: Family Medicine

## 2019-05-24 ENCOUNTER — Encounter (HOSPITAL_COMMUNITY): Payer: Self-pay

## 2019-05-24 ENCOUNTER — Other Ambulatory Visit: Payer: Self-pay

## 2019-05-24 ENCOUNTER — Other Ambulatory Visit: Payer: Self-pay | Admitting: Cardiology

## 2019-05-24 ENCOUNTER — Encounter (HOSPITAL_COMMUNITY)
Admission: RE | Admit: 2019-05-24 | Discharge: 2019-05-24 | Disposition: A | Discharge status: Home / Self Care | Payer: Medicare Other | Source: Ambulatory Visit | Attending: Orthopedic Surgery | Admitting: Orthopedic Surgery

## 2019-05-24 DIAGNOSIS — Z01818 Encounter for other preprocedural examination: Secondary | ICD-10-CM | POA: Diagnosis present

## 2019-05-24 DIAGNOSIS — Z86010 Personal history of colonic polyps: Secondary | ICD-10-CM | POA: Diagnosis not present

## 2019-05-24 DIAGNOSIS — Z87442 Personal history of urinary calculi: Secondary | ICD-10-CM | POA: Insufficient documentation

## 2019-05-24 DIAGNOSIS — Z79899 Other long term (current) drug therapy: Secondary | ICD-10-CM | POA: Insufficient documentation

## 2019-05-24 DIAGNOSIS — E785 Hyperlipidemia, unspecified: Secondary | ICD-10-CM | POA: Diagnosis not present

## 2019-05-24 DIAGNOSIS — N400 Enlarged prostate without lower urinary tract symptoms: Secondary | ICD-10-CM | POA: Insufficient documentation

## 2019-05-24 DIAGNOSIS — M1712 Unilateral primary osteoarthritis, left knee: Secondary | ICD-10-CM | POA: Diagnosis not present

## 2019-05-24 DIAGNOSIS — I2510 Atherosclerotic heart disease of native coronary artery without angina pectoris: Secondary | ICD-10-CM | POA: Diagnosis not present

## 2019-05-24 DIAGNOSIS — Z7982 Long term (current) use of aspirin: Secondary | ICD-10-CM | POA: Diagnosis not present

## 2019-05-24 DIAGNOSIS — I714 Abdominal aortic aneurysm, without rupture: Secondary | ICD-10-CM | POA: Insufficient documentation

## 2019-05-24 DIAGNOSIS — I10 Essential (primary) hypertension: Secondary | ICD-10-CM | POA: Diagnosis not present

## 2019-05-24 DIAGNOSIS — Z951 Presence of aortocoronary bypass graft: Secondary | ICD-10-CM | POA: Insufficient documentation

## 2019-05-24 DIAGNOSIS — Z96651 Presence of right artificial knee joint: Secondary | ICD-10-CM | POA: Insufficient documentation

## 2019-05-24 DIAGNOSIS — K219 Gastro-esophageal reflux disease without esophagitis: Secondary | ICD-10-CM | POA: Insufficient documentation

## 2019-05-24 DIAGNOSIS — G4733 Obstructive sleep apnea (adult) (pediatric): Secondary | ICD-10-CM | POA: Diagnosis not present

## 2019-05-24 HISTORY — DX: Tinnitus, unspecified ear: H93.19

## 2019-05-24 HISTORY — DX: Personal history of urinary calculi: Z87.442

## 2019-05-24 HISTORY — DX: Benign prostatic hyperplasia without lower urinary tract symptoms: N40.0

## 2019-05-24 HISTORY — DX: Obstructive sleep apnea (adult) (pediatric): G47.33

## 2019-05-24 HISTORY — DX: Cardiac murmur, unspecified: R01.1

## 2019-05-24 HISTORY — DX: Gastro-esophageal reflux disease without esophagitis: K21.9

## 2019-05-24 HISTORY — DX: Unspecified osteoarthritis, unspecified site: M19.90

## 2019-05-24 LAB — CBC
HCT: 44.1 % (ref 39.0–52.0)
Hemoglobin: 14.2 g/dL (ref 13.0–17.0)
MCH: 32.1 pg (ref 26.0–34.0)
MCHC: 32.2 g/dL (ref 30.0–36.0)
MCV: 99.5 fL (ref 80.0–100.0)
Platelets: 154 10*3/uL (ref 150–400)
RBC: 4.43 MIL/uL (ref 4.22–5.81)
RDW: 13 % (ref 11.5–15.5)
WBC: 6.7 10*3/uL (ref 4.0–10.5)
nRBC: 0 % (ref 0.0–0.2)

## 2019-05-24 LAB — COMPREHENSIVE METABOLIC PANEL
ALT: 18 U/L (ref 0–44)
AST: 21 U/L (ref 15–41)
Albumin: 4.5 g/dL (ref 3.5–5.0)
Alkaline Phosphatase: 52 U/L (ref 38–126)
Anion gap: 10 (ref 5–15)
BUN: 24 mg/dL — ABNORMAL HIGH (ref 8–23)
CO2: 26 mmol/L (ref 22–32)
Calcium: 9.3 mg/dL (ref 8.9–10.3)
Chloride: 106 mmol/L (ref 98–111)
Creatinine, Ser: 0.79 mg/dL (ref 0.61–1.24)
GFR calc Af Amer: >60 mL/min (ref >60)
GFR calc non Af Amer: >60 mL/min (ref >60)
Glucose, Bld: 130 mg/dL — ABNORMAL HIGH (ref 70–99)
Potassium: 4.8 mmol/L (ref 3.5–5.1)
Sodium: 142 mmol/L (ref 135–145)
Total Bilirubin: 0.8 mg/dL (ref 0.3–1.2)
Total Protein: 7 g/dL (ref 6.5–8.1)

## 2019-05-24 LAB — APTT: aPTT: 28 seconds (ref 24–36)

## 2019-05-24 LAB — PROTIME-INR
INR: 1.1 (ref 0.8–1.2)
Prothrombin Time: 13.6 seconds (ref 11.4–15.2)

## 2019-05-24 LAB — SURGICAL PCR SCREEN
MRSA, PCR: NEGATIVE
Staphylococcus aureus: NEGATIVE

## 2019-05-24 NOTE — Progress Notes (Addendum)
[name] [name] has completed COVID 19 vaccine series.  PCP - Dr. Wyonia Hough last office visit and clearance 04/13/19 and 04/30/19 in chart Cardiologist - Dr. Michelle Piper last office visit 03/30/19 in epic clearance L. Ingold NP 04/26/19 in telephone encounter and chart  Chest x-ray - greater than 1 year EKG - 03/30/19 in epic Stress Test - greater than 2 years  ECHO - 03/24/18 in epic Cardiac Cath - greater than 2 years   Sleep Study - 08/02/2015 in epic CPAP - Yes  Fasting Blood Sugar - N/A Checks Blood Sugar _ N/A____ times a day  Blood Thinner Instructions:   N/A Aspirin Instructions: Yes Last Dose: 05/26/19  Anesthesia review: CAD, CABG, AAA 3.3 cm 08/27/2018  Patient denies shortness of breath, fever, cough and chest pain at PAT appointment   Patient verbalized understanding of instructions that were given to them at the PAT appointment. Patient was also instructed that they will need to review over the PAT instructions again at home before surgery.

## 2019-05-25 NOTE — Progress Notes (Signed)
Anesthesia Chart Review   Case: [id number] Date/Time: 05/31/19 1115   Procedure: TOTAL KNEE ARTHROPLASTY (Left Knee) - 36min   Anesthesia type: Choice   Pre-op diagnosis: left knee osteoarthritis   [facility] ROOM 09 / [facility]   Surgeons: Gaynelle Arabian, MD      DISCUSSION:79 y.o. never smoker with h/o CAD s/p CABG, HTN, HLD, OSA on CPAP, GERD, AAA (3.3 cm 08/27/2018), BPH, left knee OA scheduled for above procedure 05/31/19 with Dr. Gaynelle Arabian.   Cleared received from Cardiology 04/27/19.  Per Cecilie Kicks, NP, "Chart reviewed as part of pre-operative protocol coverage. Patient was contacted 04/27/2019 in reference to pre-operative risk assessment for pending surgery as outlined below.  [name] [name] was last seen on 03/30/19 by Dr. Percival Spanish for CAD with CABG in 2006 and has been stable since that time.  Occ. Bradycardia on beta blocker..  Since that day, [name] [name] has done well with ability to reach 4 METS of activity without angina.  Therefore, based on ACC/AHA guidelines, the patient would be at acceptable risk for the planned procedure without further cardiovascular testing.  If asprin needs to be held he could do so for 5 days."  Clearance from PCP on chart.   Anticipate pt can proceed with planned procedure barring acute status change.   VS: BP (!) 143/74   Pulse (!) 57   Temp 37.2 C (Oral)   Resp 16   Ht 6' (1.829 m)   Wt 97.1 kg   SpO2 100%   BMI 29.02 kg/m   PROVIDERS: [name], DO is PCP last seen 04/13/2019  Minus Breeding, MD is Cardiologist  LABS: Labs reviewed: Acceptable for surgery. (all labs ordered are listed, but only abnormal results are displayed)  Labs Reviewed  COMPREHENSIVE METABOLIC PANEL - Abnormal; Notable for the following components:      Result Value   Glucose, Bld 130 (*)    BUN 24 (*)    All other components within normal limits  SURGICAL PCR SCREEN  APTT  CBC  PROTIME-INR  TYPE AND SCREEN      IMAGES:   EKG: 03/30/2019 Rate 57 bpm Sinus bradycardia with 1st degree AV block   CV: Echo 03/24/2018 Study Conclusions   - Left ventricle: The cavity size was normal. There was mild  concentric hypertrophy. Systolic function was normal. The  estimated ejection fraction was in the range of 60% to 65%. Wall  motion was normal; there were no regional wall motion  abnormalities. The study is not technically sufficient to allow  evaluation of LV diastolic function.  - Aortic valve: Transvalvular velocity was within the normal range.  There was no stenosis. There was mild regurgitation.  Regurgitation pressure half-time: 625 ms.  - Aorta: Ascending aortic diameter: 38 mm (S).  - Ascending aorta: The ascending aorta was mildly dilated.  - Mitral valve: Mildly calcified annulus. Transvalvular velocity  was within the normal range. There was no evidence for stenosis.  There was mild regurgitation.  - Left atrium: The atrium was mildly dilated.  - Right ventricle: The cavity size was normal. Wall thickness was  normal. Systolic function was normal.  - Atrial septum: No defect or patent foramen ovale was identified  by color flow Doppler.  - Tricuspid valve: There was mild regurgitation.  - Pulmonary arteries: Systolic pressure was within the normal  range. PA peak pressure: 27 mm Hg (S).  - Global longitudinal strain -19.8% (normal).   Exercise Tolerance Test 12/31/2016  Blood pressure demonstrated a hypertensive response to exercise.  Upsloping ST segment depression ST segment depression of 1 mm was noted during stress in the II, III and aVF leads.   Hypertensive response to exercise. Otherwise normal ECG stress test. Past Medical History:  Diagnosis Date  . AAA (abdominal aortic aneurysm) ([place])    gets abd [place] every 3 years to watch,last check 08/2018  . Arthritis   . BPH (benign prostatic hyperplasia)   . CAD (coronary artery disease)   .  Colon polyps   . Dyslipidemia   . GERD (gastroesophageal reflux disease)   . Heart murmur   . Herniated disc   . Hiatal hernia   . History of kidney stones   . HTN (hypertension)   . Hyperlipidemia   . OSA on CPAP   . Tinnitus     Past Surgical History:  Procedure Laterality Date  . COLONOSCOPY    . CORONARY ARTERY BYPASS GRAFT  2006   LIMA to LAD, SVG to RCA, SVG to circumflex, SVG to diagonal   . mole removed  2003   stomach  . right knee surgery  2008  . TONSILLECTOMY    . TOTAL KNEE ARTHROPLASTY Right 04/27/2018   Procedure: TOTAL KNEE ARTHROPLASTY RIGHT;  Surgeon: Gaynelle Arabian, MD;  Location: [facility];  Service: Orthopedics;  Laterality: Right;  35min  . UPPER GI ENDOSCOPY      MEDICATIONS: . acetaminophen (TYLENOL) 325 MG tablet  . aspirin EC 81 MG tablet  . Cholecalciferol (VITAMIN D-3) 125 MCG (5000 UT) TABS  . Digestive Enzymes (ENZYME DIGEST PO)  . Evolocumab (REPATHA SURECLICK) [id number] MG/ML SOAJ  . fluticasone (FLONASE) 50 MCG/ACT nasal spray  . meloxicam (MOBIC) 15 MG tablet  . metoprolol tartrate (LOPRESSOR) 25 MG tablet  . Misc Natural Products (TURMERIC CURCUMIN) CAPS  . Multiple Vitamin (MULTIVITAMIN WITH MINERALS) TABS tablet  . nitroGLYCERIN (NITROSTAT) 0.4 MG SL tablet  . omeprazole (PRILOSEC) 20 MG capsule  . Probiotic Product (PROBIOTIC PO)  . vitamin B-12 (CYANOCOBALAMIN) 1000 MCG tablet   No current facility-administered medications for this encounter.     Maia Plan [facility] Pre-Surgical Testing [phone] 05/25/19  4:37 PM

## 2019-05-25 NOTE — Progress Notes (Signed)
Patient called on 3/22 and asked if Covid preop testing could be done at [facility].  [name] [name] Covid site called this am and was arranged for pt to have COVID testing done

## 2019-05-27 ENCOUNTER — Other Ambulatory Visit (HOSPITAL_COMMUNITY)
Admission: RE | Admit: 2019-05-27 | Discharge: 2019-05-27 | Disposition: A | Discharge status: Home / Self Care | Payer: Medicare Other | Source: Ambulatory Visit | Attending: Orthopedic Surgery | Admitting: Orthopedic Surgery

## 2019-05-27 ENCOUNTER — Other Ambulatory Visit (HOSPITAL_COMMUNITY): Payer: Medicare Other

## 2019-05-27 ENCOUNTER — Other Ambulatory Visit: Payer: Self-pay

## 2019-05-27 DIAGNOSIS — Z01812 Encounter for preprocedural laboratory examination: Secondary | ICD-10-CM | POA: Insufficient documentation

## 2019-05-27 DIAGNOSIS — Z20822 Contact with and (suspected) exposure to covid-19: Secondary | ICD-10-CM | POA: Diagnosis not present

## 2019-05-28 LAB — SARS CORONAVIRUS 2 (TAT 6-24 HRS): SARS Coronavirus 2: NEGATIVE

## 2019-05-30 MED ORDER — BUPIVACAINE LIPOSOME 1.3 % IJ SUSP
20.0000 mL | Freq: Once | INTRAMUSCULAR | Status: DC
  Filled 2019-05-30: qty 20

## 2019-05-31 ENCOUNTER — Inpatient Hospital Stay (HOSPITAL_COMMUNITY): Payer: Medicare Other | Admitting: Physician Assistant

## 2019-05-31 ENCOUNTER — Inpatient Hospital Stay (HOSPITAL_COMMUNITY): Payer: Medicare Other | Admitting: Anesthesiology

## 2019-05-31 ENCOUNTER — Other Ambulatory Visit: Payer: Self-pay

## 2019-05-31 ENCOUNTER — Observation Stay (HOSPITAL_COMMUNITY)
Admission: RE | Admit: 2019-05-31 | Discharge: 2019-06-01 | Disposition: A | Discharge status: Home / Self Care | Payer: Medicare Other | Source: Other Acute Inpatient Hospital | Attending: Orthopedic Surgery | Admitting: Orthopedic Surgery

## 2019-05-31 ENCOUNTER — Encounter (HOSPITAL_COMMUNITY)
Admission: RE | Disposition: A | Discharge status: Home / Self Care | Payer: Self-pay | Source: Other Acute Inpatient Hospital | Attending: Orthopedic Surgery

## 2019-05-31 ENCOUNTER — Encounter (HOSPITAL_COMMUNITY): Payer: Self-pay | Admitting: Orthopedic Surgery

## 2019-05-31 DIAGNOSIS — G4733 Obstructive sleep apnea (adult) (pediatric): Secondary | ICD-10-CM | POA: Diagnosis not present

## 2019-05-31 DIAGNOSIS — Z79899 Other long term (current) drug therapy: Secondary | ICD-10-CM | POA: Diagnosis not present

## 2019-05-31 DIAGNOSIS — Z20822 Contact with and (suspected) exposure to covid-19: Secondary | ICD-10-CM | POA: Diagnosis not present

## 2019-05-31 DIAGNOSIS — E669 Obesity, unspecified: Secondary | ICD-10-CM | POA: Insufficient documentation

## 2019-05-31 DIAGNOSIS — M1712 Unilateral primary osteoarthritis, left knee: Principal | ICD-10-CM | POA: Insufficient documentation

## 2019-05-31 DIAGNOSIS — M1710 Unilateral primary osteoarthritis, unspecified knee: Secondary | ICD-10-CM | POA: Diagnosis present

## 2019-05-31 DIAGNOSIS — Z951 Presence of aortocoronary bypass graft: Secondary | ICD-10-CM | POA: Insufficient documentation

## 2019-05-31 DIAGNOSIS — Z791 Long term (current) use of non-steroidal anti-inflammatories (NSAID): Secondary | ICD-10-CM | POA: Insufficient documentation

## 2019-05-31 DIAGNOSIS — I2510 Atherosclerotic heart disease of native coronary artery without angina pectoris: Secondary | ICD-10-CM | POA: Diagnosis not present

## 2019-05-31 DIAGNOSIS — N400 Enlarged prostate without lower urinary tract symptoms: Secondary | ICD-10-CM | POA: Diagnosis not present

## 2019-05-31 DIAGNOSIS — Z7982 Long term (current) use of aspirin: Secondary | ICD-10-CM | POA: Insufficient documentation

## 2019-05-31 DIAGNOSIS — M179 Osteoarthritis of knee, unspecified: Secondary | ICD-10-CM | POA: Diagnosis present

## 2019-05-31 DIAGNOSIS — E785 Hyperlipidemia, unspecified: Secondary | ICD-10-CM | POA: Diagnosis not present

## 2019-05-31 DIAGNOSIS — G709 Myoneural disorder, unspecified: Secondary | ICD-10-CM | POA: Diagnosis not present

## 2019-05-31 DIAGNOSIS — Z6829 Body mass index (BMI) 29.0-29.9, adult: Secondary | ICD-10-CM | POA: Insufficient documentation

## 2019-05-31 DIAGNOSIS — K219 Gastro-esophageal reflux disease without esophagitis: Secondary | ICD-10-CM | POA: Insufficient documentation

## 2019-05-31 DIAGNOSIS — I10 Essential (primary) hypertension: Secondary | ICD-10-CM | POA: Diagnosis not present

## 2019-05-31 HISTORY — PX: TOTAL KNEE ARTHROPLASTY: SHX125

## 2019-05-31 LAB — TYPE AND SCREEN
ABO/RH(D): A POS
Antibody Screen: NEGATIVE

## 2019-05-31 SURGERY — ARTHROPLASTY, KNEE, TOTAL
Anesthesia: Spinal | Site: Knee | Laterality: Left

## 2019-05-31 MED ORDER — BISACODYL 10 MG RE SUPP: 10.0000 mg | Freq: Every day | RECTAL | Status: DC | PRN

## 2019-05-31 MED ORDER — STERILE WATER FOR IRRIGATION IR SOLN
Status: DC | PRN
  Administered 2019-05-31: 2000 mL

## 2019-05-31 MED ORDER — NITROGLYCERIN 0.4 MG SL SUBL: 0.4000 mg | SUBLINGUAL_TABLET | SUBLINGUAL | Status: DC | PRN

## 2019-05-31 MED ORDER — ONDANSETRON HCL 4 MG/2ML IJ SOLN: 4.0000 mg | Freq: Four times a day (QID) | INTRAMUSCULAR | Status: DC | PRN

## 2019-05-31 MED ORDER — METOPROLOL TARTRATE 25 MG PO TABS
25.0000 mg | ORAL_TABLET | Freq: Two times a day (BID) | ORAL | Status: DC
  Administered 2019-05-31 – 2019-06-01 (×2): 25 mg via ORAL
  Filled 2019-05-31 (×2): qty 1

## 2019-05-31 MED ORDER — BUPIVACAINE IN DEXTROSE 0.75-8.25 % IT SOLN
INTRATHECAL | Status: DC | PRN
  Administered 2019-05-31: 2 mL via INTRATHECAL

## 2019-05-31 MED ORDER — TRANEXAMIC ACID-NACL 1000-0.7 MG/100ML-% IV SOLN
1000.0000 mg | INTRAVENOUS | Status: AC
  Administered 2019-05-31: 1000 mg via INTRAVENOUS
  Filled 2019-05-31: qty 100

## 2019-05-31 MED ORDER — ACETAMINOPHEN 500 MG PO TABS
1000.0000 mg | ORAL_TABLET | Freq: Four times a day (QID) | ORAL | Status: AC
  Administered 2019-05-31 – 2019-06-01 (×4): 1000 mg via ORAL
  Filled 2019-05-31 (×4): qty 2

## 2019-05-31 MED ORDER — OXYCODONE HCL 5 MG PO TABS
5.0000 mg | ORAL_TABLET | ORAL | Status: DC | PRN
  Administered 2019-05-31 – 2019-06-01 (×3): 5 mg via ORAL
  Filled 2019-05-31 (×3): qty 1

## 2019-05-31 MED ORDER — MENTHOL 3 MG MT LOZG: 1.0000 | LOZENGE | OROMUCOSAL | Status: DC | PRN

## 2019-05-31 MED ORDER — ONDANSETRON HCL 4 MG PO TABS: 4.0000 mg | ORAL_TABLET | Freq: Four times a day (QID) | ORAL | Status: DC | PRN

## 2019-05-31 MED ORDER — SODIUM CHLORIDE (PF) 0.9 % IJ SOLN
INTRAMUSCULAR | Status: AC
  Filled 2019-05-31: qty 50

## 2019-05-31 MED ORDER — CEFAZOLIN SODIUM-DEXTROSE 2-4 GM/100ML-% IV SOLN
2.0000 g | Freq: Four times a day (QID) | INTRAVENOUS | Status: AC
  Administered 2019-05-31 (×2): 2 g via INTRAVENOUS
  Filled 2019-05-31 (×2): qty 100

## 2019-05-31 MED ORDER — SODIUM CHLORIDE (PF) 0.9 % IJ SOLN
INTRAMUSCULAR | Status: AC
  Filled 2019-05-31: qty 10

## 2019-05-31 MED ORDER — ONDANSETRON HCL 4 MG/2ML IJ SOLN
INTRAMUSCULAR | Status: DC | PRN
  Administered 2019-05-31: 4 mg via INTRAVENOUS

## 2019-05-31 MED ORDER — METHOCARBAMOL 500 MG IVPB - SIMPLE MED
500.0000 mg | Freq: Four times a day (QID) | INTRAVENOUS | Status: DC | PRN
  Filled 2019-05-31: qty 50

## 2019-05-31 MED ORDER — POVIDONE-IODINE 10 % EX SWAB
2.0000 "application " | Freq: Once | CUTANEOUS | Status: AC
  Administered 2019-05-31: 2 via TOPICAL

## 2019-05-31 MED ORDER — PANTOPRAZOLE SODIUM 40 MG PO TBEC
40.0000 mg | DELAYED_RELEASE_TABLET | Freq: Every day | ORAL | Status: DC
  Administered 2019-06-01: 40 mg via ORAL
  Filled 2019-05-31: qty 1

## 2019-05-31 MED ORDER — FENTANYL CITRATE (PF) 100 MCG/2ML IJ SOLN: 25.0000 ug | INTRAMUSCULAR | Status: DC | PRN

## 2019-05-31 MED ORDER — FLEET ENEMA 7-19 GM/118ML RE ENEM: 1.0000 | ENEMA | Freq: Once | RECTAL | Status: DC | PRN

## 2019-05-31 MED ORDER — DEXAMETHASONE SODIUM PHOSPHATE 10 MG/ML IJ SOLN
INTRAMUSCULAR | Status: AC
  Filled 2019-05-31: qty 1

## 2019-05-31 MED ORDER — MIDAZOLAM HCL 2 MG/2ML IJ SOLN
1.0000 mg | INTRAMUSCULAR | Status: DC
  Administered 2019-05-31: 2 mg via INTRAVENOUS
  Filled 2019-05-31: qty 2

## 2019-05-31 MED ORDER — LACTATED RINGERS IV SOLN: INTRAVENOUS | Status: DC

## 2019-05-31 MED ORDER — DEXAMETHASONE SODIUM PHOSPHATE 10 MG/ML IJ SOLN
10.0000 mg | Freq: Once | INTRAMUSCULAR | Status: DC
  Filled 2019-05-31: qty 1

## 2019-05-31 MED ORDER — MORPHINE SULFATE (PF) 2 MG/ML IV SOLN: 1.0000 mg | INTRAVENOUS | Status: DC | PRN

## 2019-05-31 MED ORDER — METOCLOPRAMIDE HCL 5 MG/ML IJ SOLN: 5.0000 mg | Freq: Three times a day (TID) | INTRAMUSCULAR | Status: DC | PRN

## 2019-05-31 MED ORDER — CHLORHEXIDINE GLUCONATE 4 % EX LIQD: 60.0000 mL | Freq: Once | CUTANEOUS | Status: DC

## 2019-05-31 MED ORDER — LIDOCAINE 2% (20 MG/ML) 5 ML SYRINGE
INTRAMUSCULAR | Status: AC
  Filled 2019-05-31: qty 5

## 2019-05-31 MED ORDER — BUPIVACAINE LIPOSOME 1.3 % IJ SUSP
INTRAMUSCULAR | Status: DC | PRN
  Administered 2019-05-31: 20 mL

## 2019-05-31 MED ORDER — PHENOL 1.4 % MT LIQD: 1.0000 | OROMUCOSAL | Status: DC | PRN

## 2019-05-31 MED ORDER — PROPOFOL 500 MG/50ML IV EMUL
INTRAVENOUS | Status: AC
  Filled 2019-05-31: qty 50

## 2019-05-31 MED ORDER — ACETAMINOPHEN 10 MG/ML IV SOLN
1000.0000 mg | Freq: Four times a day (QID) | INTRAVENOUS | Status: DC
  Administered 2019-05-31: 1000 mg via INTRAVENOUS
  Filled 2019-05-31: qty 100

## 2019-05-31 MED ORDER — RIVAROXABAN 10 MG PO TABS
10.0000 mg | ORAL_TABLET | Freq: Every day | ORAL | Status: DC
  Administered 2019-06-01: 10 mg via ORAL
  Filled 2019-05-31: qty 1

## 2019-05-31 MED ORDER — SODIUM CHLORIDE 0.9 % IV SOLN: INTRAVENOUS | Status: DC

## 2019-05-31 MED ORDER — DEXAMETHASONE SODIUM PHOSPHATE 10 MG/ML IJ SOLN
8.0000 mg | Freq: Once | INTRAMUSCULAR | Status: AC
  Administered 2019-05-31: 8 mg via INTRAVENOUS

## 2019-05-31 MED ORDER — PROPOFOL 500 MG/50ML IV EMUL
INTRAVENOUS | Status: DC | PRN
  Administered 2019-05-31: 30 ug/kg/min via INTRAVENOUS

## 2019-05-31 MED ORDER — 0.9 % SODIUM CHLORIDE (POUR BTL) OPTIME
TOPICAL | Status: DC | PRN
  Administered 2019-05-31: 1000 mL

## 2019-05-31 MED ORDER — EPHEDRINE SULFATE-NACL 50-0.9 MG/10ML-% IV SOSY
PREFILLED_SYRINGE | INTRAVENOUS | Status: DC | PRN
  Administered 2019-05-31 (×2): 10 mg via INTRAVENOUS

## 2019-05-31 MED ORDER — ONDANSETRON HCL 4 MG/2ML IJ SOLN
INTRAMUSCULAR | Status: AC
  Filled 2019-05-31: qty 2

## 2019-05-31 MED ORDER — METHOCARBAMOL 500 MG PO TABS
500.0000 mg | ORAL_TABLET | Freq: Four times a day (QID) | ORAL | Status: DC | PRN
  Administered 2019-05-31 – 2019-06-01 (×2): 500 mg via ORAL
  Filled 2019-05-31 (×2): qty 1

## 2019-05-31 MED ORDER — DIPHENHYDRAMINE HCL 12.5 MG/5ML PO ELIX: 12.5000 mg | ORAL_SOLUTION | ORAL | Status: DC | PRN

## 2019-05-31 MED ORDER — DOCUSATE SODIUM 100 MG PO CAPS
100.0000 mg | ORAL_CAPSULE | Freq: Two times a day (BID) | ORAL | Status: DC
  Administered 2019-05-31 – 2019-06-01 (×2): 100 mg via ORAL
  Filled 2019-05-31 (×2): qty 1

## 2019-05-31 MED ORDER — FLUTICASONE PROPIONATE 50 MCG/ACT NA SUSP
2.0000 | Freq: Every day | NASAL | Status: DC | PRN
  Filled 2019-05-31: qty 16

## 2019-05-31 MED ORDER — GABAPENTIN 100 MG PO CAPS
200.0000 mg | ORAL_CAPSULE | Freq: Three times a day (TID) | ORAL | Status: DC
  Administered 2019-05-31 – 2019-06-01 (×3): 200 mg via ORAL
  Filled 2019-05-31 (×3): qty 2

## 2019-05-31 MED ORDER — SODIUM CHLORIDE 0.9 % IR SOLN
Status: DC | PRN
  Administered 2019-05-31: 1000 mL

## 2019-05-31 MED ORDER — POLYETHYLENE GLYCOL 3350 17 G PO PACK: 17.0000 g | PACK | Freq: Every day | ORAL | Status: DC | PRN

## 2019-05-31 MED ORDER — TRAMADOL HCL 50 MG PO TABS: 50.0000 mg | ORAL_TABLET | Freq: Four times a day (QID) | ORAL | Status: DC | PRN

## 2019-05-31 MED ORDER — FENTANYL CITRATE (PF) 100 MCG/2ML IJ SOLN
50.0000 ug | INTRAMUSCULAR | Status: DC
  Administered 2019-05-31: 75 ug via INTRAVENOUS
  Filled 2019-05-31: qty 2

## 2019-05-31 MED ORDER — CEFAZOLIN SODIUM-DEXTROSE 2-4 GM/100ML-% IV SOLN
2.0000 g | INTRAVENOUS | Status: AC
  Administered 2019-05-31: 2 g via INTRAVENOUS
  Filled 2019-05-31: qty 100

## 2019-05-31 MED ORDER — SODIUM CHLORIDE (PF) 0.9 % IJ SOLN
INTRAMUSCULAR | Status: DC | PRN
  Administered 2019-05-31: 60 mL

## 2019-05-31 MED ORDER — METOCLOPRAMIDE HCL 5 MG PO TABS: 5.0000 mg | ORAL_TABLET | Freq: Three times a day (TID) | ORAL | Status: DC | PRN

## 2019-05-31 MED ORDER — PROPOFOL 10 MG/ML IV BOLUS
INTRAVENOUS | Status: DC | PRN
  Administered 2019-05-31 (×3): 20 mg via INTRAVENOUS

## 2019-05-31 SURGICAL SUPPLY — 61 items
ATTUNE MED DOME PAT 41 KNEE (Knees) ×1 IMPLANT
ATTUNE MED DOME PAT 41MM KNEE (Knees) ×1 IMPLANT
ATTUNE PS FEM LT SZ 8 CEM KNEE (Femur) ×2 IMPLANT
ATTUNE PSRP INSR SZ8 10 KNEE (Insert) ×1 IMPLANT
ATTUNE PSRP INSR SZ8 10MMKNEE (Insert) ×1 IMPLANT
BAG SPEC THK2 15X12 ZIP CLS (MISCELLANEOUS) ×1
BAG ZIPLOCK 12X15 (MISCELLANEOUS) ×3 IMPLANT
BASE TIBIAL ROT PLAT SZ 8 KNEE (Knees) IMPLANT
BLADE SAG 18X100X1.27 (BLADE) ×3 IMPLANT
BLADE SAW SGTL 11.0X1.19X90.0M (BLADE) ×3 IMPLANT
BLADE SURG SZ10 CARB STEEL (BLADE) ×6 IMPLANT
BNDG ELASTIC 6X5.8 VLCR STR LF (GAUZE/BANDAGES/DRESSINGS) ×3 IMPLANT
BOWL SMART MIX CTS (DISPOSABLE) ×3 IMPLANT
BSPLAT TIB 8 CMNT ROT PLAT STR (Knees) ×1 IMPLANT
CEMENT HV SMART SET (Cement) ×6 IMPLANT
CLOSURE WOUND 1/2 X4 (GAUZE/BANDAGES/DRESSINGS) ×1
COVER SURGICAL LIGHT HANDLE (MISCELLANEOUS) ×3 IMPLANT
COVER WAND RF STERILE (DRAPES) IMPLANT
CUFF TOURN SGL QUICK 34 (TOURNIQUET CUFF) ×3
CUFF TRNQT CYL 34X4.125X (TOURNIQUET CUFF) ×1 IMPLANT
DECANTER SPIKE VIAL GLASS SM (MISCELLANEOUS) ×3 IMPLANT
DRAPE U-SHAPE 47X51 STRL (DRAPES) ×3 IMPLANT
DRSG AQUACEL AG ADV 3.5X10 (GAUZE/BANDAGES/DRESSINGS) ×3 IMPLANT
DURAPREP 26ML APPLICATOR (WOUND CARE) ×3 IMPLANT
ELECT REM PT RETURN 15FT ADLT (MISCELLANEOUS) ×3 IMPLANT
EVACUATOR 1/8 PVC DRAIN (DRAIN) ×2 IMPLANT
GAUZE SPONGE 2X2 8PLY STRL LF (GAUZE/BANDAGES/DRESSINGS) ×1 IMPLANT
GLOVE BIO SURGEON STRL SZ7 (GLOVE) ×3 IMPLANT
GLOVE BIO SURGEON STRL SZ8 (GLOVE) ×3 IMPLANT
GLOVE BIOGEL PI IND STRL 7.0 (GLOVE) ×1 IMPLANT
GLOVE BIOGEL PI IND STRL 8 (GLOVE) ×1 IMPLANT
GLOVE BIOGEL PI INDICATOR 7.0 (GLOVE) ×2
GLOVE BIOGEL PI INDICATOR 8 (GLOVE) ×2
GOWN STRL REUS W/TWL LRG LVL3 (GOWN DISPOSABLE) ×6 IMPLANT
HANDPIECE INTERPULSE COAX TIP (DISPOSABLE) ×3
HOLDER FOLEY CATH W/STRAP (MISCELLANEOUS) ×2 IMPLANT
IMMOBILIZER KNEE 20 (SOFTGOODS) ×3
IMMOBILIZER KNEE 20 THIGH 36 (SOFTGOODS) ×1 IMPLANT
KIT TURNOVER KIT A (KITS) IMPLANT
MANIFOLD NEPTUNE II (INSTRUMENTS) ×3 IMPLANT
NS IRRIG 1000ML POUR BTL (IV SOLUTION) ×3 IMPLANT
PACK TOTAL KNEE CUSTOM (KITS) ×3 IMPLANT
PAD CAST 4YDX4 CTTN HI CHSV (CAST SUPPLIES) IMPLANT
PADDING CAST COTTON 4X4 STRL (CAST SUPPLIES) ×3
PADDING CAST COTTON 6X4 STRL (CAST SUPPLIES) ×6 IMPLANT
PENCIL SMOKE EVACUATOR (MISCELLANEOUS) IMPLANT
PIN STEINMAN FIXATION KNEE (PIN) ×2 IMPLANT
PROTECTOR NERVE ULNAR (MISCELLANEOUS) ×3 IMPLANT
SET HNDPC FAN SPRY TIP SCT (DISPOSABLE) ×1 IMPLANT
SPONGE GAUZE 2X2 STER 10/PKG (GAUZE/BANDAGES/DRESSINGS) ×2
STRIP CLOSURE SKIN 1/2X4 (GAUZE/BANDAGES/DRESSINGS) ×3 IMPLANT
SUT MNCRL AB 4-0 PS2 18 (SUTURE) ×3 IMPLANT
SUT STRATAFIX 0 PDS 27 VIOLET (SUTURE) ×3
SUT VIC AB 2-0 CT1 27 (SUTURE) ×9
SUT VIC AB 2-0 CT1 TAPERPNT 27 (SUTURE) ×3 IMPLANT
SUTURE STRATFX 0 PDS 27 VIOLET (SUTURE) ×1 IMPLANT
TIBIAL BASE ROT PLAT SZ 8 KNEE (Knees) ×3 IMPLANT
TRAY FOLEY MTR SLVR 16FR STAT (SET/KITS/TRAYS/PACK) ×3 IMPLANT
WATER STERILE IRR 1000ML POUR (IV SOLUTION) ×6 IMPLANT
WRAP KNEE MAXI GEL POST OP (GAUZE/BANDAGES/DRESSINGS) ×3 IMPLANT
YANKAUER SUCT BULB TIP 10FT TU (MISCELLANEOUS) ×3 IMPLANT

## 2019-05-31 NOTE — Interval H&P Note (Signed)
History and Physical Interval Note:  05/31/2019 9:37 AM  [name] [name]  has presented today for surgery, with the diagnosis of left knee osteoarthritis.  The various methods of treatment have been discussed with the patient and family. After consideration of risks, benefits and other options for treatment, the patient has consented to  Procedure(s) with comments: TOTAL KNEE ARTHROPLASTY (Left) - 52min as a surgical intervention.  The patient's history has been reviewed, patient examined, no change in status, stable for surgery.  I have reviewed the patient's chart and labs.  Questions were answered to the patient's satisfaction.     Pilar Plate Jeter Tomey

## 2019-05-31 NOTE — Anesthesia Postprocedure Evaluation (Signed)
Anesthesia Post Note  Patient: [name] [name]  Procedure(s) Performed: TOTAL KNEE ARTHROPLASTY (Left Knee)     Patient location during evaluation: PACU Anesthesia Type: Spinal Level of consciousness: awake Pain management: pain level controlled Vital Signs Assessment: post-procedure vital signs reviewed and stable Respiratory status: spontaneous breathing Cardiovascular status: stable Postop Assessment: no apparent nausea or vomiting Anesthetic complications: no    Last Vitals:  Vitals:   05/31/19 1722 05/31/19 1803  BP: 138/76 132/73  Pulse: 74 69  Resp: 16 16  Temp: 36.6 C 36.8 C  SpO2: 100% 99%    Last Pain:  Vitals:   05/31/19 1603  TempSrc: Oral  PainSc:                  Elwyn Lowden

## 2019-05-31 NOTE — Anesthesia Procedure Notes (Signed)
Anesthesia Regional Block: Adductor canal block   Pre-Anesthetic Checklist: ,, timeout performed, Correct Patient, Correct Site, Correct Laterality, Correct Procedure, Correct Position, site marked, Risks and benefits discussed,  Surgical consent,  Pre-op evaluation,  At surgeon's request and post-op pain management  Laterality: Left  Prep: Dura Prep       Needles:   Needle Type: Echogenic Stimulator Needle          Additional Needles:   Procedures: Doppler guided,,,, ultrasound used (permanent image in chart),,,,  Narrative:  Start time: 05/31/2019 11:45 AM End time: 05/31/2019 12:00 PM Injection made incrementally with aspirations every 5 mL.  Performed by: Personally  Anesthesiologist: Belinda Block, MD

## 2019-05-31 NOTE — Transfer of Care (Signed)
Immediate Anesthesia Transfer of Care Note  Patient: [name] [name]  Procedure(s) Performed: TOTAL KNEE ARTHROPLASTY (Left Knee)  Patient Location: PACU  Anesthesia Type:Regional and Spinal  Level of Consciousness: awake, alert  and oriented  Airway & Oxygen Therapy: Patient Spontanous Breathing and Patient connected to face mask oxygen  Post-op Assessment: Report given to RN and Post -op Vital signs reviewed and stable  Post vital signs: Reviewed and stable  Last Vitals:  Vitals Value Taken Time  BP    Temp    Pulse    Resp 15 05/31/19 1402  SpO2    Vitals shown include unvalidated device data.  Last Pain:  Vitals:   05/31/19 1105  TempSrc:   PainSc: 0-No pain      Patients Stated Pain Goal: 2 ([id number])  Complications: No apparent anesthesia complications

## 2019-05-31 NOTE — Evaluation (Signed)
Physical Therapy Evaluation Patient Details Name: [name] [name] MRN: [id number] DOB: [birth year] Today's Date: 05/31/2019   History of Present Illness  s/p L TKA. PMH: R TKA 04/27/18, tinnitus, AAA, PVCs, HTN, CAD, CABG  Clinical Impression  Pt is s/p TKA resulting in the deficits listed below (see PT Problem List).  Pt amb 44' with RW and min/guard assist. Anticipate steady progress in acute setting.   Pt will benefit from skilled PT to increase their independence and safety with mobility to allow discharge to the venue listed below.      Follow Up Recommendations Follow surgeon's recommendation for DC plan and follow-up therapies    Equipment Recommendations  None recommended by PT    Recommendations for Other Services       Precautions / Restrictions Precautions Precautions: Fall;Knee Required Braces or Orthoses: Knee Immobilizer - Left Restrictions Weight Bearing Restrictions: No Other Position/Activity Restrictions: WBAT      Mobility  Bed Mobility Overal bed mobility: Needs Assistance Bed Mobility: Supine to Sit     Supine to sit: Min assist;Min guard     General bed mobility comments: cues to self assist, incr time  Transfers Overall transfer level: Needs assistance Equipment used: Rolling walker (2 wheeled) Transfers: Sit to/from Stand Sit to Stand: Min guard         General transfer comment: cues for hand placement and LLE position  Ambulation/Gait Ambulation/Gait assistance: Min assist Gait Distance (Feet): 80 Feet Assistive device: Rolling walker (2 wheeled) Gait Pattern/deviations: Step-to pattern;Decreased stance time - left     General Gait Details: cues for initial  sequence, RW position from self  Stairs            Wheelchair Mobility    Modified Rankin (Stroke Patients Only)       Balance                                             Pertinent Vitals/Pain Pain Assessment: 0-10 Pain Score: 4  Pain  Location: left knee Pain Descriptors / Indicators: Grimacing;Sore Pain Intervention(s): Limited activity within patient's tolerance;Monitored during session    [place] expects to be discharged to:: Private residence Living Arrangements: Spouse/significant other Available Help at Discharge: Family Type of Home: House Home Access: Stairs to enter Entrance Stairs-Rails: Right Entrance Stairs-Number of Steps: 6 Home Layout: Two level Home Equipment: Environmental consultant - 2 wheels;Cane - single point      Prior Function Level of Independence: Independent;Independent with assistive device(s)         Comments: pt was amb with cane d/t ongoing knee pain. pt enjoys playing golf     Hand Dominance        Extremity/Trunk Assessment   Upper Extremity Assessment Upper Extremity Assessment: Overall WFL for tasks assessed    Lower Extremity Assessment Lower Extremity Assessment: LLE deficits/detail LLE Deficits / Details: ankle WFL, knee and hip grossly 2+/5, quad leg  ~10 degrees with SLR       Communication   Communication: No difficulties  Cognition Arousal/Alertness: Awake/alert Behavior During Therapy: WFL for tasks assessed/performed Overall Cognitive Status: Within Functional Limits for tasks assessed                                        General Comments  Exercises Total Joint Exercises Ankle Circles/Pumps: AROM;Both;10 reps Quad Sets: AROM;Both;10 reps Heel Slides: AROM;5 reps;Left Straight Leg Raises: AROM;Left;5 reps   Assessment/Plan    PT Assessment Patient needs continued PT services  PT Problem List Decreased strength;Decreased range of motion;Decreased activity tolerance;Decreased mobility;Pain;Decreased knowledge of use of DME       PT Treatment Interventions DME instruction;Therapeutic exercise;Gait training;Stair training;Functional mobility training;Therapeutic activities;Patient/family education    PT Goals (Current  goals can be found in the Care Plan section)  Acute Rehab PT Goals Patient Stated Goal: home tomorrow, get back to playing golf PT Goal Formulation: With patient Time For Goal Achievement: 06/07/19 Potential to Achieve Goals: Good    Frequency 7X/week   Barriers to discharge        Co-evaluation               AM-PAC PT "6 Clicks" Mobility  Outcome Measure Help needed turning from your back to your side while in a flat bed without using bedrails?: A Little Help needed moving from lying on your back to sitting on the side of a flat bed without using bedrails?: A Little Help needed moving to and from a bed to a chair (including a wheelchair)?: A Little Help needed standing up from a chair using your arms (e.g., wheelchair or bedside chair)?: A Little Help needed to walk in hospital room?: A Little Help needed climbing 3-5 steps with a railing? : A Little 6 Click Score: 18    End of Session Equipment Utilized During Treatment: Gait belt;Left knee immobilizer Activity Tolerance: Patient tolerated treatment well Patient left: in chair;with call bell/phone within reach;with chair alarm set;with family/visitor present   PT Visit Diagnosis: Difficulty in walking, not elsewhere classified (R26.2)    Time: [id number] PT Time Calculation (min) (ACUTE ONLY): 32 min   Charges:   PT Evaluation $PT Eval Low Complexity: 1 Low PT Treatments $Gait Training: 8-22 mins        Baxter Flattery, PT   Acute Rehab Dept [facility]): [id number]   05/31/2019   [facility] 05/31/2019, 6:06 PM

## 2019-05-31 NOTE — Anesthesia Preprocedure Evaluation (Addendum)
Anesthesia Evaluation  Patient identified by MRN, date of birth, ID band Patient awake    Reviewed: Allergy & Precautions, NPO status , Patient's Chart, lab work & pertinent test results  Airway Mallampati: II  TM Distance: >3 FB     Dental   Pulmonary sleep apnea ,    breath sounds clear to auscultation       Cardiovascular hypertension, + CAD  + Valvular Problems/Murmurs  Rhythm:Regular Rate:Normal     Neuro/Psych  Neuromuscular disease    GI/Hepatic Neg liver ROS, hiatal hernia, GERD  ,  Endo/Other  negative endocrine ROS  Renal/GU negative Renal ROS     Musculoskeletal  (+) Arthritis ,   Abdominal   Peds  Hematology   Anesthesia Other Findings   Reproductive/Obstetrics                            Anesthesia Physical Anesthesia Plan  ASA: III  Anesthesia Plan: Spinal   Post-op Pain Management:    Induction: Intravenous  PONV Risk Score and Plan: 2 and Ondansetron, Dexamethasone and Midazolam  Airway Management Planned: Nasal Cannula and Simple Face Mask  Additional Equipment:   Intra-op Plan:   Post-operative Plan:   Informed Consent: I have reviewed the patients History and Physical, chart, labs and discussed the procedure including the risks, benefits and alternatives for the proposed anesthesia with the patient or authorized representative who has indicated his/her understanding and acceptance.     Dental advisory given  Plan Discussed with: Anesthesiologist and CRNA  Anesthesia Plan Comments:         Anesthesia Quick Evaluation

## 2019-05-31 NOTE — Anesthesia Procedure Notes (Signed)
Spinal  Patient location during procedure: OR Start time: 05/31/2019 12:10 PM End time: 05/31/2019 12:25 PM Staffing Anesthesiologist: Belinda Block, MD Preanesthetic Checklist Completed: patient identified, IV checked, site marked, risks and benefits discussed, surgical consent, monitors and equipment checked and pre-op evaluation Spinal Block Patient position: sitting Prep: DuraPrep Patient monitoring: heart rate, cardiac monitor, continuous pulse ox and blood pressure Approach: right paramedian Location: L3-4 Injection technique: single-shot Needle Needle type: [place]  Needle gauge: 22 G Assessment Sensory level: T12 Additional Notes Clear CSF. Pt tolerated procedure well CG

## 2019-05-31 NOTE — Discharge Instructions (Addendum)
Gaynelle Arabian, MD Total Joint Specialist [facility] [place] [address] [place], [place] [address] [phone]  TOTAL KNEE REPLACEMENT POSTOPERATIVE DIRECTIONS    Knee Rehabilitation, Guidelines Following Surgery  Results after knee surgery are often greatly improved when you follow the exercise, range of motion and muscle strengthening exercises prescribed by your doctor. Safety measures are also important to protect the knee from further injury. If any of these exercises cause you to have increased pain or swelling in your knee joint, decrease the amount until you are comfortable again and slowly increase them. If you have problems or questions, call your caregiver or physical therapist for advice.   BLOOD CLOT PREVENTION . Take a 10 mg Xarelto once a day for three weeks following surgery. Then resume one 81 mg Aspirin once a day. . You may resume your vitamins/supplements upon once you have discontinued the Xarelto. . Do not take any NSAIDs (Advil, Aleve, Ibuprofen, Meloxicam, etc.) until you have discontinued the Xarelto.   HOME CARE INSTRUCTIONS  . Remove items at home which could result in a fall. This includes throw rugs or furniture in walking pathways.  . ICE to the affected knee as much as tolerated. Icing helps control swelling. If the swelling is well controlled you will be more comfortable and rehab easier. Continue to use ice on the knee for pain and swelling from surgery. You may notice swelling that will progress down to the foot and ankle. This is normal after surgery. Elevate the leg when you are not up walking on it.    . Continue to use the breathing machine which will help keep your temperature down. It is common for your temperature to cycle up and down following surgery, especially at night when you are not up moving around and exerting yourself. The breathing machine keeps your lungs expanded and your temperature down. . Do not place pillow  under the operative knee, focus on keeping the knee straight while resting  DIET You may resume your previous home diet once you are discharged from the hospital.  DRESSING / [place] / SHOWERING . Keep your bulky bandage on for 2 days. On the third post-operative day you may remove the Ace bandage and gauze. There is a waterproof adhesive bandage on your skin which will stay in place until your first follow-up appointment. Once you remove this you will not need to place another bandage . You may begin showering 3 days following surgery, but do not submerge the incision under water.  ACTIVITY For the first 5 days, the key is rest and control of pain and swelling . Do your home exercises twice a day starting on post-operative day 3. On the days you go to physical therapy, just do the home exercises once that day. . You should rest, ice and elevate the leg for 50 minutes out of every hour. Get up and walk/stretch for 10 minutes per hour. After 5 days you can increase your activity slowly as tolerated. . Walk with your walker as instructed. Use the walker until you are comfortable transitioning to a cane. Walk with the cane in the opposite hand of the operative leg. You may discontinue the cane once you are comfortable and walking steadily. . Avoid periods of inactivity such as sitting longer than an hour when not asleep. This helps prevent blood clots.  . You may discontinue the knee immobilizer once you are able to perform a straight leg raise while lying down. Marland Kitchen  You may resume a sexual relationship in one month or when given the OK by your doctor.  . You may return to work once you are cleared by your doctor.  . Do not drive a car for 6 weeks or until released by your surgeon.  . Do not drive while taking narcotics.  TED HOSE STOCKINGS Wear the elastic stockings on both legs for three weeks following surgery during the day. You may remove them at night for sleeping.  WEIGHT BEARING Weight  bearing as tolerated with assist device (walker, cane, etc) as directed, use it as long as suggested by your surgeon or therapist, typically at least 4-6 weeks.  POSTOPERATIVE CONSTIPATION PROTOCOL Constipation - defined medically as fewer than three stools per week and severe constipation as less than one stool per week.  One of the most common issues patients have following surgery is constipation.  Even if you have a regular bowel pattern at home, your normal regimen is likely to be disrupted due to multiple reasons following surgery.  Combination of anesthesia, postoperative narcotics, change in appetite and fluid intake all can affect your bowels.  In order to avoid complications following surgery, here are some recommendations in order to help you during your recovery period.  . Colace (docusate) - Pick up an over-the-counter form of Colace or another stool softener and take twice a day as long as you are requiring postoperative pain medications.  Take with a full glass of water daily.  If you experience loose stools or diarrhea, hold the colace until you stool forms back up. If your symptoms do not get better within 1 week or if they get worse, check with your doctor. . Dulcolax (bisacodyl) - Pick up over-the-counter and take as directed by the product packaging as needed to assist with the movement of your bowels.  Take with a full glass of water.  Use this product as needed if not relieved by Colace only.  . MiraLax (polyethylene glycol) - Pick up over-the-counter to have on hand. MiraLax is a solution that will increase the amount of water in your bowels to assist with bowel movements.  Take as directed and can mix with a glass of water, juice, soda, coffee, or tea. Take if you go more than two days without a movement. Do not use MiraLax more than once per day. Call your doctor if you are still constipated or irregular after using this medication for 7 days in a row.  If you continue to have  problems with postoperative constipation, please contact the office for further assistance and recommendations.  If you experience "the worst abdominal pain ever" or develop nausea or vomiting, please contact the office immediatly for further recommendations for treatment.  ITCHING If you experience itching with your medications, try taking only a single pain pill, or even half a pain pill at a time.  You can also use Benadryl over the counter for itching or also to help with sleep.   MEDICATIONS See your medication summary on the "After Visit Summary" that the nursing staff will review with you prior to discharge.  You may have some home medications which will be placed on hold until you complete the course of blood thinner medication.  It is important for you to complete the blood thinner medication as prescribed by your surgeon.  Continue your approved medications as instructed at time of discharge.  PRECAUTIONS . If you experience chest pain or shortness of breath - call 911 immediately for   transfer to the hospital emergency department.  . If you develop a fever greater that 101 F, purulent drainage from wound, increased redness or drainage from wound, foul odor from the wound/dressing, or calf pain - CONTACT YOUR SURGEON.                                                   FOLLOW-UP APPOINTMENTS Make sure you keep all of your appointments after your operation with your surgeon and caregivers. You should call the office at the above phone number and make an appointment for approximately two weeks after the date of your surgery or on the date instructed by your surgeon outlined in the "After Visit Summary".  RANGE OF MOTION AND STRENGTHENING EXERCISES  Rehabilitation of the knee is important following a knee injury or an operation. After just a few days of immobilization, the muscles of the thigh which control the knee become weakened and shrink (atrophy). Knee exercises are designed to build up the  tone and strength of the thigh muscles and to improve knee motion. Often times heat used for twenty to thirty minutes before working out will loosen up your tissues and help with improving the range of motion but do not use heat for the first two weeks following surgery. These exercises can be done on a training (exercise) mat, on the floor, on a table or on a bed. Use what ever works the best and is most comfortable for you Knee exercises include:  . Leg Lifts - While your knee is still immobilized in a splint or cast, you can do straight leg raises. Lift the leg to 60 degrees, hold for 3 sec, and slowly lower the leg. Repeat 10-20 times 2-3 times daily. Perform this exercise against resistance later as your knee gets better.  . Quad and Hamstring Sets - Tighten up the muscle on the front of the thigh (Quad) and hold for 5-10 sec. Repeat this 10-20 times hourly. Hamstring sets are done by pushing the foot backward against an object and holding for 5-10 sec. Repeat as with quad sets.   Leg Slides: Lying on your back, slowly slide your foot toward your buttocks, bending your knee up off the floor (only go as far as is comfortable). Then slowly slide your foot back down until your leg is flat on the floor again.  Angel Wings: Lying on your back spread your legs to the side as far apart as you can without causing discomfort.  A rehabilitation program following serious knee injuries can speed recovery and prevent re-injury in the future due to weakened muscles. Contact your doctor or a physical therapist for more information on knee rehabilitation.   IF YOU ARE TRANSFERRED TO A SKILLED REHAB FACILITY If the patient is transferred to a skilled rehab facility following release from the hospital, a list of the current medications will be sent to the facility for the patient to continue.  When discharged from the skilled rehab facility, please have the facility set up the patient's Home Health Physical Therapy  prior to being released. Also, the skilled facility will be responsible for providing the patient with their medications at time of release from the facility to include their pain medication, the muscle relaxants, and their blood thinner medication. If the patient is still at the rehab facility at time of the   two week follow up appointment, the skilled rehab facility will also need to assist the patient in arranging follow up appointment in our office and any transportation needs.  MAKE SURE YOU:  . Understand these instructions.  . Get help right away if you are not doing well or get worse.    Pick up stool softner and laxative for home use following surgery while on pain medications. Do not submerge incision under water. Please use good hand washing techniques while changing dressing each day. May shower starting three days after surgery. Please use a clean towel to pat the incision dry following showers. Continue to use ice for pain and swelling after surgery. Do not use any lotions or creams on the incision until instructed by your surgeon.   Information on my medicine - XARELTO (Rivaroxaban)  Why was Xarelto prescribed for you? Xarelto was prescribed for you to reduce the risk of blood clots forming after orthopedic surgery. The medical term for these abnormal blood clots is venous thromboembolism (VTE).  What do you need to know about xarelto ? Take your Xarelto ONCE DAILY at the same time every day. You may take it either with or without food.  If you have difficulty swallowing the tablet whole, you may crush it and mix in applesauce just prior to taking your dose.  Take Xarelto exactly as prescribed by your doctor and DO NOT stop taking Xarelto without talking to the doctor who prescribed the medication.  Stopping without other VTE prevention medication to take the place of Xarelto may increase your risk of developing a clot.  After discharge, you should have regular  check-up appointments with your healthcare provider that is prescribing your Xarelto.    What do you do if you miss a dose? If you miss a dose, take it as soon as you remember on the same day then continue your regularly scheduled once daily regimen the next day. Do not take two doses of Xarelto on the same day.   Important Safety Information A possible side effect of Xarelto is bleeding. You should call your healthcare provider right away if you experience any of the following: ? Bleeding from an injury or your nose that does not stop. ? Unusual colored urine (red or dark brown) or unusual colored stools (red or black). ? Unusual bruising for unknown reasons. ? A serious fall or if you hit your head (even if there is no bleeding).  Some medicines may interact with Xarelto and might increase your risk of bleeding while on Xarelto. To help avoid this, consult your healthcare provider or pharmacist prior to using any new prescription or non-prescription medications, including herbals, vitamins, non-steroidal anti-inflammatory drugs (NSAIDs) and supplements.  This website has more information on Xarelto: [email or web address]

## 2019-05-31 NOTE — Op Note (Signed)
OPERATIVE REPORT-TOTAL KNEE ARTHROPLASTY   Pre-operative diagnosis- Osteoarthritis  Left knee(s)  Post-operative diagnosis- Osteoarthritis Left knee(s)  Procedure-  Left  Total Knee Arthroplasty  Surgeon- [name] Plover. Adren Dollins, MD  Assistant- Griffith Citron, PA-C   Anesthesia-  Adductor canal block and spinal  EBL- 25 ml   Drains Hemovac  Tourniquet time-  Total Tourniquet Time Documented: Thigh (Left) - 41 minutes Total: Thigh (Left) - 41 minutes     Complications- None  Condition-PACU - hemodynamically stable.   Brief Clinical Note   [name] [name] is a 79 y.o. year old male with end stage OA of his left knee with progressively worsening pain and dysfunction. He has constant pain, with activity and at rest and significant functional deficits with difficulties even with ADLs. He has had extensive non-op management including analgesics, injections of cortisone and viscosupplements, and home exercise program, but remains in significant pain with significant dysfunction. Radiographs show bone on bone arthritis medial and patellofemoral. He presents now for left Total Knee Arthroplasty.    Procedure in detail---   The patient is brought into the operating room and positioned supine on the operating table. After successful administration of  Adductor canal block and spinal,   a tourniquet is placed high on the  Left thigh(s) and the lower extremity is prepped and draped in the usual sterile fashion. Time out is performed by the operating team and then the  Left lower extremity is wrapped in Esmarch, knee flexed and the tourniquet inflated to 300 mmHg.       A midline incision is made with a ten blade through the subcutaneous tissue to the level of the extensor mechanism. A fresh blade is used to make a medial parapatellar arthrotomy. Soft tissue over the proximal medial tibia is subperiosteally elevated to the joint line with a knife and into the semimembranosus bursa with a Cobb  elevator. Soft tissue over the proximal lateral tibia is elevated with attention being paid to avoiding the patellar tendon on the tibial tubercle. The patella is everted, knee flexed 90 degrees and the ACL and PCL are removed. Findings are bone on bone medial and patellofemoral with massive global osteophytes.        The drill is used to create a starting hole in the distal femur and the canal is thoroughly irrigated with sterile saline to remove the fatty contents. The 5 degree Left  valgus alignment guide is placed into the femoral canal and the distal femoral cutting block is pinned to remove 9 mm off the distal femur. Resection is made with an oscillating saw.      The tibia is subluxed forward and the menisci are removed. The extramedullary alignment guide is placed referencing proximally at the medial aspect of the tibial tubercle and distally along the second metatarsal axis and tibial crest. The block is pinned to remove 36mm off the more deficient medial  side. Resection is made with an oscillating saw. Size 8is the most appropriate size for the tibia and the proximal tibia is prepared with the modular drill and keel punch for that size.      The femoral sizing guide is placed and size 8 is most appropriate. Rotation is marked off the epicondylar axis and confirmed by creating a rectangular flexion gap at 90 degrees. The size 8 cutting block is pinned in this rotation and the anterior, posterior and chamfer cuts are made with the oscillating saw. The intercondylar block is then placed and that cut is  made.      Trial size 8 tibial component, trial size 8 posterior stabilized femur and a 10  mm posterior stabilized rotating platform insert trial is placed. Full extension is achieved with excellent varus/valgus and anterior/posterior balance throughout full range of motion. The patella is everted and thickness measured to be 27  mm. Free hand resection is taken to 15 mm, a 41 template is placed, lug holes  are drilled, trial patella is placed, and it tracks normally. Osteophytes are removed off the posterior femur with the trial in place. All trials are removed and the cut bone surfaces prepared with pulsatile lavage. Cement is mixed and once ready for implantation, the size 8 tibial implant, size  8 posterior stabilized femoral component, and the size 41 patella are cemented in place and the patella is held with the clamp. The trial insert is placed and the knee held in full extension. The Exparel (20 ml mixed with 60 ml saline) is injected into the extensor mechanism, posterior capsule, medial and lateral gutters and subcutaneous tissues.  All extruded cement is removed and once the cement is hard the permanent 10 mm posterior stabilized rotating platform insert is placed into the tibial tray.      The wound is copiously irrigated with saline solution and the extensor mechanism closed over a hemovac drain with #1 V-loc suture. The tourniquet is released for a total tourniquet time of 41  minutes. Flexion against gravity is 140 degrees and the patella tracks normally. Subcutaneous tissue is closed with 2.0 vicryl and subcuticular with running 4.0 Monocryl. The incision is cleaned and dried and steri-strips and a bulky sterile dressing are applied. The limb is placed into a knee immobilizer and the patient is awakened and transported to recovery in stable condition.      Please note that a surgical assistant was a medical necessity for this procedure in order to perform it in a safe and expeditious manner. Surgical assistant was necessary to retract the ligaments and vital neurovascular structures to prevent injury to them and also necessary for proper positioning of the limb to allow for anatomic placement of the prosthesis.   [name] Plover Cosette Prindle, MD    05/31/2019, 1:29 PM

## 2019-05-31 NOTE — Progress Notes (Signed)
Assisted Dr. Belinda Block with left, ultrasound guided, adductor canal block. Side rails up, monitors on throughout procedure. See vital signs in flow sheet. Tolerated Procedure well.

## 2019-06-01 ENCOUNTER — Emergency Department (HOSPITAL_COMMUNITY)
Admission: EM | Admit: 2019-06-01 | Discharge: 2019-06-01 | Disposition: A | Discharge status: Home / Self Care | Payer: Medicare Other | Source: Home / Self Care | Attending: Emergency Medicine | Admitting: Emergency Medicine

## 2019-06-01 ENCOUNTER — Encounter: Payer: Self-pay | Admitting: *Deleted

## 2019-06-01 ENCOUNTER — Other Ambulatory Visit: Payer: Self-pay

## 2019-06-01 DIAGNOSIS — I10 Essential (primary) hypertension: Secondary | ICD-10-CM | POA: Insufficient documentation

## 2019-06-01 DIAGNOSIS — R55 Syncope and collapse: Secondary | ICD-10-CM | POA: Insufficient documentation

## 2019-06-01 DIAGNOSIS — I2510 Atherosclerotic heart disease of native coronary artery without angina pectoris: Secondary | ICD-10-CM | POA: Insufficient documentation

## 2019-06-01 DIAGNOSIS — M1712 Unilateral primary osteoarthritis, left knee: Secondary | ICD-10-CM | POA: Diagnosis not present

## 2019-06-01 DIAGNOSIS — Z79899 Other long term (current) drug therapy: Secondary | ICD-10-CM | POA: Insufficient documentation

## 2019-06-01 DIAGNOSIS — Z96653 Presence of artificial knee joint, bilateral: Secondary | ICD-10-CM | POA: Insufficient documentation

## 2019-06-01 LAB — CBC WITH DIFFERENTIAL/PLATELET
Abs Immature Granulocytes: 0.07 10*3/uL (ref 0.00–0.07)
Basophils Absolute: 0 10*3/uL (ref 0.0–0.1)
Basophils Relative: 0 %
Eosinophils Absolute: 0 10*3/uL (ref 0.0–0.5)
Eosinophils Relative: 0 %
HCT: 36.3 % — ABNORMAL LOW (ref 39.0–52.0)
Hemoglobin: 11.8 g/dL — ABNORMAL LOW (ref 13.0–17.0)
Immature Granulocytes: 0 %
Lymphocytes Relative: 13 %
Lymphs Abs: 2.1 10*3/uL (ref 0.7–4.0)
MCH: 32.1 pg (ref 26.0–34.0)
MCHC: 32.5 g/dL (ref 30.0–36.0)
MCV: 98.6 fL (ref 80.0–100.0)
Monocytes Absolute: 1.4 10*3/uL — ABNORMAL HIGH (ref 0.1–1.0)
Monocytes Relative: 9 %
Neutro Abs: 12.1 10*3/uL — ABNORMAL HIGH (ref 1.7–7.7)
Neutrophils Relative %: 78 %
Platelets: 146 10*3/uL — ABNORMAL LOW (ref 150–400)
RBC: 3.68 MIL/uL — ABNORMAL LOW (ref 4.22–5.81)
RDW: 13.1 % (ref 11.5–15.5)
WBC: 15.6 10*3/uL — ABNORMAL HIGH (ref 4.0–10.5)
nRBC: 0 % (ref 0.0–0.2)

## 2019-06-01 LAB — BASIC METABOLIC PANEL
Anion gap: 6 (ref 5–15)
BUN: 18 mg/dL (ref 8–23)
CO2: 25 mmol/L (ref 22–32)
Calcium: 8.5 mg/dL — ABNORMAL LOW (ref 8.9–10.3)
Chloride: 106 mmol/L (ref 98–111)
Creatinine, Ser: 0.82 mg/dL (ref 0.61–1.24)
GFR calc Af Amer: >60 mL/min (ref >60)
GFR calc non Af Amer: >60 mL/min (ref >60)
Glucose, Bld: 164 mg/dL — ABNORMAL HIGH (ref 70–99)
Potassium: 4.2 mmol/L (ref 3.5–5.1)
Sodium: 137 mmol/L (ref 135–145)

## 2019-06-01 LAB — COMPREHENSIVE METABOLIC PANEL
ALT: 11 U/L (ref 0–44)
AST: 18 U/L (ref 15–41)
Albumin: 3.8 g/dL (ref 3.5–5.0)
Alkaline Phosphatase: 48 U/L (ref 38–126)
Anion gap: 7 (ref 5–15)
BUN: 20 mg/dL (ref 8–23)
CO2: 30 mmol/L (ref 22–32)
Calcium: 8.8 mg/dL — ABNORMAL LOW (ref 8.9–10.3)
Chloride: 105 mmol/L (ref 98–111)
Creatinine, Ser: 0.92 mg/dL (ref 0.61–1.24)
GFR calc Af Amer: >60 mL/min (ref >60)
GFR calc non Af Amer: >60 mL/min (ref >60)
Glucose, Bld: 136 mg/dL — ABNORMAL HIGH (ref 70–99)
Potassium: 4 mmol/L (ref 3.5–5.1)
Sodium: 142 mmol/L (ref 135–145)
Total Bilirubin: 0.3 mg/dL (ref 0.3–1.2)
Total Protein: 6.1 g/dL — ABNORMAL LOW (ref 6.5–8.1)

## 2019-06-01 LAB — CBC
HCT: 35.1 % — ABNORMAL LOW (ref 39.0–52.0)
Hemoglobin: 11.4 g/dL — ABNORMAL LOW (ref 13.0–17.0)
MCH: 32 pg (ref 26.0–34.0)
MCHC: 32.5 g/dL (ref 30.0–36.0)
MCV: 98.6 fL (ref 80.0–100.0)
Platelets: 128 10*3/uL — ABNORMAL LOW (ref 150–400)
RBC: 3.56 MIL/uL — ABNORMAL LOW (ref 4.22–5.81)
RDW: 12.9 % (ref 11.5–15.5)
WBC: 14.5 10*3/uL — ABNORMAL HIGH (ref 4.0–10.5)
nRBC: 0 % (ref 0.0–0.2)

## 2019-06-01 LAB — CBG MONITORING, ED: Glucose-Capillary: 125 mg/dL — ABNORMAL HIGH (ref 70–99)

## 2019-06-01 LAB — TROPONIN I (HIGH SENSITIVITY): Troponin I (High Sensitivity): 7 ng/L (ref <18)

## 2019-06-01 MED ORDER — GABAPENTIN 100 MG PO CAPS
200.0000 mg | ORAL_CAPSULE | Freq: Once | ORAL | Status: AC
  Administered 2019-06-01: 17:00:00 200 mg via ORAL
  Filled 2019-06-01: qty 2

## 2019-06-01 MED ORDER — TRAMADOL HCL 50 MG PO TABS: 50.0000 mg | ORAL_TABLET | Freq: Four times a day (QID) | ORAL | 0 refills | Status: DC | PRN

## 2019-06-01 MED ORDER — OXYCODONE-ACETAMINOPHEN 5-325 MG PO TABS
2.0000 | ORAL_TABLET | Freq: Once | ORAL | Status: AC
  Administered 2019-06-01: 2 via ORAL
  Filled 2019-06-01: qty 2

## 2019-06-01 MED ORDER — METHOCARBAMOL 500 MG PO TABS: 500.0000 mg | ORAL_TABLET | Freq: Four times a day (QID) | ORAL | 0 refills | Status: DC | PRN

## 2019-06-01 MED ORDER — RIVAROXABAN 10 MG PO TABS: 10.0000 mg | ORAL_TABLET | Freq: Every day | ORAL | 0 refills | Status: DC

## 2019-06-01 MED ORDER — OXYCODONE HCL 5 MG PO TABS: 5.0000 mg | ORAL_TABLET | Freq: Four times a day (QID) | ORAL | 0 refills | Status: DC | PRN

## 2019-06-01 MED ORDER — GABAPENTIN 100 MG PO CAPS: ORAL_CAPSULE | ORAL | 0 refills | Status: DC

## 2019-06-01 MED ORDER — SODIUM CHLORIDE 0.9 % IV BOLUS (SEPSIS)
1000.0000 mL | Freq: Once | INTRAVENOUS | Status: AC
  Administered 2019-06-01: 1000 mL via INTRAVENOUS

## 2019-06-01 NOTE — Progress Notes (Signed)
   Subjective: 1 Day Post-Op Procedure(s) (LRB): TOTAL KNEE ARTHROPLASTY (Left) Patient reports pain as mild.   Patient seen in rounds by Dr. Wynelle Link. Patient is well, and has had no acute complaints or problems other than pain in the left knee. Denies chest pain, SOB, or calf pain. No issues overnight. Foley catheter to be removed this AM. We will continue therapy today, ambulated 70' yesterday.   Objective: Vital signs in last 24 hours: Temp:  [97.5 F (36.4 C)-98.5 F (36.9 C)] 97.9 F (36.6 C) (03/30 0518) Pulse Rate:  [42-103] 54 (03/30 0518) Resp:  [0-21] 19 (03/30 0518) BP: (116-162)/(61-86) 116/65 (03/30 0518) SpO2:  [92 %-100 %] 98 % (03/30 0518) Weight:  [97.1 kg] 97.1 kg (03/29 0950)  Intake/Output from previous day:  Intake/Output Summary (Last 24 hours) at 06/01/2019 0708 Last data filed at 06/01/2019 0530 Gross per 24 hour  Intake 3798 ml  Output 4498 ml  Net -700 ml    Labs: Recent Labs    06/01/19 0345  HGB 11.4*   Recent Labs    06/01/19 0345  WBC 14.5*  RBC 3.56*  HCT 35.1*  PLT 128*   Recent Labs    06/01/19 0345  NA 137  K 4.2  CL 106  CO2 25  BUN 18  CREATININE 0.82  GLUCOSE 164*  CALCIUM 8.5*   Exam: General - Patient is Alert and Oriented Extremity - Neurologically intact Neurovascular intact Sensation intact distally Dorsiflexion/Plantar flexion intact Dressing - dressing C/D/I Motor Function - intact, moving foot and toes well on exam.   Past Medical History:  Diagnosis Date  . AAA (abdominal aortic aneurysm) ([place])    gets abd [place] every 3 years to watch,last check 08/2018  . Arthritis   . BPH (benign prostatic hyperplasia)   . CAD (coronary artery disease)   . Colon polyps   . Dyslipidemia   . GERD (gastroesophageal reflux disease)   . Heart murmur   . Herniated disc   . Hiatal hernia   . History of kidney stones   . HTN (hypertension)   . Hyperlipidemia   . OSA on CPAP   . Tinnitus     Assessment/Plan: 1 Day  Post-Op Procedure(s) (LRB): TOTAL KNEE ARTHROPLASTY (Left) Principal Problem:   OA (osteoarthritis) of knee  Estimated body mass index is 29.03 kg/m as calculated from the following:   Height as of this encounter: 6' (1.829 m).   Weight as of this encounter: 97.1 kg. Advance diet Up with therapy D/C IV fluids  Anticipated LOS equal to or greater than 2 midnights due to - Age 25 and older with one or more of the following:  - Obesity  - Expected need for hospital services (PT, OT, Nursing) required for safe  discharge  - Anticipated need for postoperative skilled nursing care or inpatient rehab  - Active co-morbidities: Coronary Artery Disease OR   - Unanticipated findings during/Post Surgery: None  - Patient is a high risk of re-admission due to: None    DVT Prophylaxis - Xarelto Weight bearing as tolerated. D/C O2 and pulse ox and try on room air. Hemovac pulled without difficulty, will continue therapy today.  Plan is to go Home after hospital stay. Plan for discharge later today if progresses with therapy and is meeting his goals. Scheduled for outpatient physical therapy at [facility] in [place]. Follow-up in the office in 2 weeks.   Theresa Duty, PA-C Orthopedic Surgery [phone] 06/01/2019, 7:08 AM

## 2019-06-01 NOTE — ED Triage Notes (Signed)
Pt arrived via wheelchair into ED CC near syncope event after [facility] hospital DC from upstairs following knee replacement yesterday. Pt wife at bedside. Pt reports remembering event, no pain "other than his knee, denies hitting head.    Hx left knee replacement

## 2019-06-01 NOTE — Progress Notes (Signed)
Physical Therapy Treatment Patient Details Name: [name] [name] MRN: [id number] DOB: 03/14/1940 Today's Date: 06/01/2019    History of Present Illness s/p L TKA. PMH: R TKA 04/27/18, tinnitus, AAA, PVCs, HTN, CAD, CABG    PT Comments    Pt progressing toward PT goals, will see again for stair practice and should be ready to d/c later today    Follow Up Recommendations  Follow surgeon's recommendation for DC plan and follow-up therapies     Equipment Recommendations  None recommended by PT    Recommendations for Other Services       Precautions / Restrictions Precautions Precautions: Fall;Knee Required Braces or Orthoses: Knee Immobilizer - Left Knee Immobilizer - Left: Discontinue once straight leg raise with < 10 degree lag Restrictions Weight Bearing Restrictions: No Other Position/Activity Restrictions: WBAT    Mobility  Bed Mobility   Bed Mobility: Supine to Sit     Supine to sit: Min guard;Supervision     General bed mobility comments: for safety, incr time  Transfers Overall transfer level: Needs assistance Equipment used: Rolling walker (2 wheeled) Transfers: Sit to/from Stand Sit to Stand: Min guard;Supervision         General transfer comment: cues for hand placement and LLE position  Ambulation/Gait Ambulation/Gait assistance: Min guard Gait Distance (Feet): 140 Feet Assistive device: Rolling walker (2 wheeled) Gait Pattern/deviations: Step-to pattern;Decreased stance time - left     General Gait Details: cues for initial  sequence, RW position from self   Stairs             Wheelchair Mobility    Modified Rankin (Stroke Patients Only)       Balance                                            Cognition Arousal/Alertness: Awake/alert Behavior During Therapy: WFL for tasks assessed/performed Overall Cognitive Status: Within Functional Limits for tasks assessed                                         Exercises Total Joint Exercises Ankle Circles/Pumps: AROM;Both;10 reps Quad Sets: AROM;Both;10 reps Heel Slides: AAROM;AROM;Left;10 reps Hip ABduction/ADduction: AROM;Left;10 reps Straight Leg Raises: AROM;Left;10 reps Goniometric ROM:  grossly -6 to 95 degrees L knee flexion, AAROM    General Comments        Pertinent Vitals/Pain Pain Assessment: 0-10 Pain Score: 2  Pain Location: left knee Pain Descriptors / Indicators: Grimacing;Sore Pain Intervention(s): Limited activity within patient's tolerance;Monitored during session;Premedicated before session;Repositioned;Ice applied    Home Living                      Prior Function            PT Goals (current goals can now be found in the care plan section) Acute Rehab PT Goals Patient Stated Goal: home tomorrow, get back to playing golf PT Goal Formulation: With patient Time For Goal Achievement: 06/07/19 Potential to Achieve Goals: Good Progress towards PT goals: Progressing toward goals    Frequency    7X/week      PT Plan Current plan remains appropriate    Co-evaluation              AM-PAC PT "6 Clicks" Mobility  Outcome Measure  Help needed turning from your back to your side while in a flat bed without using bedrails?: A Little Help needed moving from lying on your back to sitting on the side of a flat bed without using bedrails?: A Little Help needed moving to and from a bed to a chair (including a wheelchair)?: A Little Help needed standing up from a chair using your arms (e.g., wheelchair or bedside chair)?: A Little Help needed to walk in hospital room?: A Little Help needed climbing 3-5 steps with a railing? : A Little 6 Click Score: 18    End of Session Equipment Utilized During Treatment: Gait belt;Left knee immobilizer Activity Tolerance: Patient tolerated treatment well Patient left: in chair;with call bell/phone within reach;with chair alarm set Nurse  Communication: Mobility status PT Visit Diagnosis: Difficulty in walking, not elsewhere classified (R26.2)     Time: [id number] PT Time Calculation (min) (ACUTE ONLY): 28 min  Charges:  $Gait Training: 8-22 mins $Therapeutic Exercise: 8-22 mins                     Baxter Flattery, PT   Acute Rehab Dept [facility]): [id number]   06/01/2019    [facility] 06/01/2019, 10:36 AM

## 2019-06-01 NOTE — Plan of Care (Signed)
Patient discharged home in stable condition 

## 2019-06-01 NOTE — ED Provider Notes (Signed)
[facility] DEPT Provider Note   CSN: [id number] Arrival date & time: 06/01/19  1530     History Chief Complaint  Patient presents with  . Near Syncope    [name] [name] is a 79 y.o. male.  Patient was being wheeled out from the hospital after having knee surgery became dizzy and passed out in the chair.  Patient feels fine now  The history is provided by the patient. No language interpreter was used.  Near Syncope This is a new problem. The current episode started 3 to 5 hours ago. The problem occurs rarely. The problem has been resolved. Pertinent negatives include no chest pain, no abdominal pain and no headaches. Nothing aggravates the symptoms. Nothing relieves the symptoms. He has tried nothing for the symptoms. The treatment provided no relief.       Past Medical History:  Diagnosis Date  . AAA (abdominal aortic aneurysm) ([place])    gets abd [place] every 3 years to watch,last check 08/2018  . Arthritis   . BPH (benign prostatic hyperplasia)   . CAD (coronary artery disease)   . Colon polyps   . Dyslipidemia   . GERD (gastroesophageal reflux disease)   . Heart murmur   . Herniated disc   . Hiatal hernia   . History of kidney stones   . HTN (hypertension)   . Hyperlipidemia   . OSA on CPAP   . Tinnitus     Patient Active Problem List   Diagnosis Date Noted  . Osteoarthritis, knee 04/28/2018  . OA (osteoarthritis) of knee 04/27/2018  . Osteoarthritis of knee 05/08/2017  . Tinnitus of both ears 09/10/2016  . Ventral hernia without obstruction or gangrene 03/28/2016  . AAA (abdominal aortic aneurysm) without rupture ([place]) 07/20/2015  . Obstructive sleep apnea 03/17/2014  . Erectile dysfunction due to arterial insufficiency 01/12/2014  . Thrombocytopenia ([place]) 12/30/2013  . Allergic rhinitis 10/01/2012  . BPH (benign prostatic hyperplasia) 10/01/2012  . PVC's (premature ventricular contractions) 09/01/2009  . COLONIC POLYPS  04/14/2009  . Hyperlipidemia LDL goal <70 04/14/2009  . Essential hypertension 04/14/2009  . Coronary atherosclerosis 04/14/2009  . HIATAL HERNIA 04/14/2009  . HERNIATED DISC 04/14/2009    Past Surgical History:  Procedure Laterality Date  . COLONOSCOPY    . CORONARY ARTERY BYPASS GRAFT  2006   LIMA to LAD, SVG to RCA, SVG to circumflex, SVG to diagonal   . mole removed  2003   stomach  . right knee surgery  2008  . TONSILLECTOMY    . TOTAL KNEE ARTHROPLASTY Right 04/27/2018   Procedure: TOTAL KNEE ARTHROPLASTY RIGHT;  Surgeon: Gaynelle Arabian, MD;  Location: [facility] ORS;  Service: Orthopedics;  Laterality: Right;  18min  . TOTAL KNEE ARTHROPLASTY Left 05/31/2019   Procedure: TOTAL KNEE ARTHROPLASTY;  Surgeon: Gaynelle Arabian, MD;  Location: [facility] ORS;  Service: Orthopedics;  Laterality: Left;  34min  . UPPER GI ENDOSCOPY         Family History  Problem Relation Age of Onset  . Heart disease Father   . Cancer Father        Bladder Cancer  . Heart attack Father   . Colon polyps Father   . Hypertension Mother   . Alzheimer's disease Mother   . Stroke Mother        3-4   . Diabetes Brother   . Heart disease Brother   . Arthritis Brother        bilateral knee replacement, back issues   .  Pneumonia Sister   . GI problems Sister        esophogus stretched   . Crohn's disease Son     Social History   Tobacco Use  . Smoking status: Never Smoker  . Smokeless tobacco: Never Used  . Tobacco comment: does not smoke   Substance Use Topics  . Alcohol use: Yes    Alcohol/week: 1.0 standard drinks    Types: 1 Glasses of wine per week    Comment: with meal  . Drug use: No    Home Medications Prior to Admission medications   Medication Sig Start Date End Date Taking? Authorizing Provider  acetaminophen (TYLENOL) 325 MG tablet Take 650 mg by mouth every 6 (six) hours as needed (for pain.).    [provider]  Digestive Enzymes (ENZYME DIGEST PO) Take 1 capsule by mouth at  bedtime.    [provider]  Evolocumab (REPATHA SURECLICK) XX123456 MG/ML SOAJ Inject 140 mLs into the skin every 14 (fourteen) days. 02/19/19   [name], DO  fluticasone (FLONASE) 50 MCG/ACT nasal spray Place 2 sprays into both nostrils daily as needed for allergies or rhinitis.    [provider]  gabapentin (NEURONTIN) 100 MG capsule Take 200 mg three times a day for two weeks following surgery.Then take 200 mg two times a day for two weeks. Then take 200 mg once a day for two weeks. Then discontinue. 06/01/19   Edmisten, Ok Anis, PA  methocarbamol (ROBAXIN) 500 MG tablet Take 1 tablet (500 mg total) by mouth every 6 (six) hours as needed for muscle spasms. 06/01/19   Edmisten, Kristie L, PA  metoprolol tartrate (LOPRESSOR) 25 MG tablet Take 1 tablet (25 mg total) by mouth 2 (two) times daily. 05/25/19   Minus Breeding, MD  nitroGLYCERIN (NITROSTAT) 0.4 MG SL tablet Place 1 tablet (0.4 mg total) under the tongue every 5 (five) minutes as needed for chest pain. 07/17/18 05/18/20  Chipper Herb, MD  omeprazole (PRILOSEC) 20 MG capsule Take 20 mg by mouth daily.    [provider]  oxyCODONE (OXY IR/ROXICODONE) 5 MG immediate release tablet Take 1-2 tablets (5-10 mg total) by mouth every 6 (six) hours as needed for severe pain. 06/01/19   Edmisten, Ok Anis, PA  Probiotic Product (PROBIOTIC PO) Take 1 capsule by mouth daily.    [provider]  rivaroxaban (XARELTO) 10 MG TABS tablet Take 1 tablet (10 mg total) by mouth daily with breakfast for 20 days. Then resume one 81 mg aspirin once a day. 06/01/19 06/21/19  Edmisten, Ok Anis, PA  traMADol (ULTRAM) 50 MG tablet Take 1-2 tablets (50-100 mg total) by mouth every 6 (six) hours as needed for moderate pain. 06/01/19   Edmisten, Ok Anis, PA    Allergies    Levofloxacin, Crestor [rosuvastatin], and Vytorin [ezetimibe-simvastatin]  Review of Systems   Review of Systems  Constitutional: Negative for appetite  change and fatigue.  HENT: Negative for congestion, ear discharge and sinus pressure.   Eyes: Negative for discharge.  Respiratory: Negative for cough.   Cardiovascular: Positive for near-syncope. Negative for chest pain.  Gastrointestinal: Negative for abdominal pain and diarrhea.  Genitourinary: Negative for frequency and hematuria.  Musculoskeletal: Negative for back pain.  Skin: Negative for rash.  Neurological: Positive for dizziness. Negative for seizures and headaches.  Psychiatric/Behavioral: Negative for hallucinations.    Physical Exam Updated Vital Signs BP (!) 147/62   Pulse 60   Temp 98 F (36.7 C)  Resp 18   SpO2 100%   Physical Exam Vitals and nursing note reviewed.  Constitutional:      Appearance: He is well-developed.  HENT:     Head: Normocephalic.     Nose: Nose normal.  Eyes:     General: No scleral icterus.    Conjunctiva/sclera: Conjunctivae normal.  Neck:     Thyroid: No thyromegaly.  Cardiovascular:     Rate and Rhythm: Normal rate and regular rhythm.     Heart sounds: No murmur. No friction rub. No gallop.   Pulmonary:     Breath sounds: No stridor. No wheezing or rales.  Chest:     Chest wall: No tenderness.  Abdominal:     General: There is no distension.     Tenderness: There is no abdominal tenderness. There is no rebound.  Musculoskeletal:        General: Normal range of motion.     Cervical back: Neck supple.  Lymphadenopathy:     Cervical: No cervical adenopathy.  Skin:    Findings: No erythema or rash.  Neurological:     Mental Status: He is oriented to person, place, and time.     Motor: No abnormal muscle tone.     Coordination: Coordination normal.  Psychiatric:        Behavior: Behavior normal.     ED Results / Procedures / Treatments   Labs (all labs ordered are listed, but only abnormal results are displayed) Labs Reviewed  CBC WITH DIFFERENTIAL/PLATELET - Abnormal; Notable for the following components:       Result Value   WBC 15.6 (*)    RBC 3.68 (*)    Hemoglobin 11.8 (*)    HCT 36.3 (*)    Platelets 146 (*)    Neutro Abs 12.1 (*)    Monocytes Absolute 1.4 (*)    All other components within normal limits  COMPREHENSIVE METABOLIC PANEL - Abnormal; Notable for the following components:   Glucose, Bld 136 (*)    Calcium 8.8 (*)    Total Protein 6.1 (*)    All other components within normal limits  CBG MONITORING, ED - Abnormal; Notable for the following components:   Glucose-Capillary 125 (*)    All other components within normal limits  TROPONIN I (HIGH SENSITIVITY)  TROPONIN I (HIGH SENSITIVITY)    EKG EKG Interpretation  Date/Time:  Tuesday June 01 2019 15:49:10 EDT Ventricular Rate:  55 PR Interval:    QRS Duration: 105 QT Interval:  449 QTC Calculation: 430 R Axis:   3 Text Interpretation: Junctional rhythm Abnormal R-wave progression, early transition Confirmed by Milton Ferguson [phone]) on 06/01/2019 6:14:31 PM   Radiology No results found.  Procedures Procedures (including critical care time)  Medications Ordered in ED Medications  gabapentin (NEURONTIN) capsule 200 mg (200 mg Oral Given 06/01/19 1648)  oxyCODONE-acetaminophen (PERCOCET/ROXICET) 5-325 MG per tablet 2 tablet (2 tablets Oral Given 06/01/19 1652)  sodium chloride 0.9 % bolus 1,000 mL (1,000 mLs Intravenous New Bag/Given 06/01/19 1652)    ED Course  I have reviewed the triage vital signs and the nursing notes.  Pertinent labs & imaging results that were available during my care of the patient were reviewed by me and considered in my medical decision making (see chart for details).    MDM Rules/Calculators/A&P                      Labs unremarkable.  EKG shows no acute changes.  Patient was given a liter of fluids and felt much better.  He will be discharged home and will follow up with his orthopedic as planned Final Clinical Impression(s) / ED Diagnoses Final diagnoses:  Near syncope    Rx /  DC Orders ED Discharge Orders    None       Milton Ferguson, MD 06/01/19 1849

## 2019-06-01 NOTE — Progress Notes (Signed)
06/01/19 1300  PT Visit Information  Last PT Received On 06/01/19  Pt continues to progress very well. Ready for d/c from PT standpoint   Assistance Needed +1  History of Present Illness s/p L TKA. PMH: R TKA 04/27/18, tinnitus, AAA, PVCs, HTN, CAD, CABG  Subjective Data  Patient Stated Goal home tomorrow, get back to playing golf  Precautions  Precautions Fall;Knee  Required Braces or Orthoses Knee Immobilizer - Left  Knee Immobilizer - Left Discontinue once straight leg raise with < 10 degree lag  Restrictions  Weight Bearing Restrictions No  Other Position/Activity Restrictions WBAT  Pain Assessment  Pain Assessment 0-10  Pain Score 3  Pain Location left knee  Pain Descriptors / Indicators Grimacing;Sore  Pain Intervention(s) Limited activity within patient's tolerance;Monitored during session;Premedicated before session;Repositioned  Cognition  Arousal/Alertness Awake/alert  Behavior During Therapy WFL for tasks assessed/performed  Overall Cognitive Status Within Functional Limits for tasks assessed  Bed Mobility  Bed Mobility Supine to Sit;Sit to Supine  Supine to sit Supervision;Modified independent (Device/Increase time)  Sit to supine Supervision;Modified independent (Device/Increase time)  General bed mobility comments for safety, incr time  Transfers  Overall transfer level Needs assistance  Equipment used Rolling walker (2 wheeled)  Transfers Sit to/from Stand  Sit to Stand Supervision  General transfer comment self cues end of session for hand placement   Ambulation/Gait  Ambulation/Gait assistance Supervision  Gait Distance (Feet) 150 Feet  Assistive device Rolling walker (2 wheeled)  Gait Pattern/deviations Decreased stance time - left;Step-to pattern;Step-through pattern  General Gait Details cues for initial  sequence, RW position from self, gait progression  Stairs Yes  Stairs assistance Supervision;Min guard  Stair Management Two rails;Step to  pattern;Forwards  Number of Stairs 5 (x2)  General stair comments cues for sequence, supervision to min/guard for safety   General Comments  General comments (skin integrity, edema, etc.) reviewed useof KI fo rside sleeping to keep knee straight, use of ice   PT - End of Session  Equipment Utilized During Treatment Gait belt  Activity Tolerance Patient tolerated treatment well  Patient left with call bell/phone within reach;in bed;with bed alarm set  Nurse Communication Mobility status   PT - Assessment/Plan  PT Plan Current plan remains appropriate  PT Visit Diagnosis Difficulty in walking, not elsewhere classified (R26.2)  PT Frequency (ACUTE ONLY) 7X/week  Follow Up Recommendations Follow surgeon's recommendation for DC plan and follow-up therapies  PT equipment None recommended by PT  AM-PAC PT "6 Clicks" Mobility Outcome Measure (Version 2)  Help needed turning from your back to your side while in a flat bed without using bedrails? 4  Help needed moving from lying on your back to sitting on the side of a flat bed without using bedrails? 4  Help needed moving to and from a bed to a chair (including a wheelchair)? 3  Help needed standing up from a chair using your arms (e.g., wheelchair or bedside chair)? 3  Help needed to walk in hospital room? 3  Help needed climbing 3-5 steps with a railing?  3  6 Click Score 20  Consider Recommendation of Discharge To: Home with no services  PT Goal Progression  Progress towards PT goals Progressing toward goals  Acute Rehab PT Goals  PT Goal Formulation With patient  Time For Goal Achievement 06/07/19  Potential to Achieve Goals Good  PT Time Calculation  PT Start Time (ACUTE ONLY) 1252  PT Stop Time (ACUTE ONLY) 1318  PT Time Calculation (  min) (ACUTE ONLY) 26 min  PT General Charges  $$ ACUTE PT VISIT 1 Visit  PT Treatments  $Gait Training 23-37 mins

## 2019-06-01 NOTE — Discharge Instructions (Addendum)
Take your medicines as prescribed by your doctor and follow-up with your orthopedic doctor as planned.  If you have any more episodes of weakness or dizziness get checked by your family doctor or return

## 2019-06-01 NOTE — TOC Transition Note (Signed)
Transition of Care [facility]) - CM/SW Discharge Note   Patient Details  Name: [name] [name] MRN: [id number] Date of Birth: 12-Dec-1940  Transition of [facility]) CM/SW Contact:  Lennart Pall, LCSW Phone Number: 06/01/2019, 9:57 AM   Clinical Narrative:  Pt eager for d/c and notes he already has OPPT appointments arranged and has all needed DME.  No further needs.     Final next level of care: OP Rehab(already set up for OPPT at [facility] in [place]) Barriers to Discharge: Barriers Resolved   Patient Goals and CMS Choice        Discharge Placement                       Discharge Plan and Services                DME Arranged: N/A(has all recommended DME already)                    Social Determinants of Health (SDOH) Interventions     Readmission Risk Interventions Readmission Risk Prevention Plan 06/01/2019  Post Dischage Appt Complete  Medication Screening Complete  Transportation Screening Complete  Some recent data might be hidden

## 2019-06-02 LAB — CBG MONITORING, ED: Glucose-Capillary: 130 mg/dL — ABNORMAL HIGH (ref 70–99)

## 2019-06-02 NOTE — Discharge Summary (Signed)
Physician Discharge Summary   Patient ID: [name] [name] MRN: [id number] DOB/AGE: Nov 16, 1940 79 y.o.  Admit date: 05/31/2019 Discharge date: 06/01/2019  Primary Diagnosis: Osteoarthritis, left knee   Admission Diagnoses:  Past Medical History:  Diagnosis Date  . AAA (abdominal aortic aneurysm) ([place])    gets abd [place] every 3 years to watch,last check 08/2018  . Arthritis   . BPH (benign prostatic hyperplasia)   . CAD (coronary artery disease)   . Colon polyps   . Dyslipidemia   . GERD (gastroesophageal reflux disease)   . Heart murmur   . Herniated disc   . Hiatal hernia   . History of kidney stones   . HTN (hypertension)   . Hyperlipidemia   . OSA on CPAP   . Tinnitus    Discharge Diagnoses:   Principal Problem:   OA (osteoarthritis) of knee  Estimated body mass index is 29.03 kg/m as calculated from the following:   Height as of this encounter: 6' (1.829 m).   Weight as of this encounter: 97.1 kg.  Procedure:  Procedure(s) (LRB): TOTAL KNEE ARTHROPLASTY (Left)   Consults: None  HPI: [name] [name] is a 79 y.o. year old male with end stage OA of his left knee with progressively worsening pain and dysfunction. He has constant pain, with activity and at rest and significant functional deficits with difficulties even with ADLs. He has had extensive non-op management including analgesics, injections of cortisone and viscosupplements, and home exercise program, but remains in significant pain with significant dysfunction. Radiographs show bone on bone arthritis medial and patellofemoral. He presents now for left Total Knee Arthroplasty.  Laboratory Data: Admission on 06/01/2019, Discharged on 06/01/2019  Component Date Value Ref Range Status  . Glucose-Capillary 06/01/2019 125* 70 - 99 mg/dL Final   Glucose reference range applies only to samples taken after fasting for at least 8 hours.  . WBC 06/01/2019 15.6* 4.0 - 10.5 K/uL Final  . RBC 06/01/2019 3.68* 4.22 -  5.81 MIL/uL Final  . Hemoglobin 06/01/2019 11.8* 13.0 - 17.0 g/dL Final  . HCT 06/01/2019 36.3* 39.0 - 52.0 % Final  . MCV 06/01/2019 98.6  80.0 - 100.0 fL Final  . MCH 06/01/2019 32.1  26.0 - 34.0 pg Final  . MCHC 06/01/2019 32.5  30.0 - 36.0 g/dL Final  . RDW 06/01/2019 13.1  11.5 - 15.5 % Final  . Platelets 06/01/2019 146* 150 - 400 K/uL Final  . nRBC 06/01/2019 0.0  0.0 - 0.2 % Final  . Neutrophils Relative % 06/01/2019 78  % Final  . Neutro Abs 06/01/2019 12.1* 1.7 - 7.7 K/uL Final  . Lymphocytes Relative 06/01/2019 13  % Final  . Lymphs Abs 06/01/2019 2.1  0.7 - 4.0 K/uL Final  . Monocytes Relative 06/01/2019 9  % Final  . Monocytes Absolute 06/01/2019 1.4* 0.1 - 1.0 K/uL Final  . Eosinophils Relative 06/01/2019 0  % Final  . Eosinophils Absolute 06/01/2019 0.0  0.0 - 0.5 K/uL Final  . Basophils Relative 06/01/2019 0  % Final  . Basophils Absolute 06/01/2019 0.0  0.0 - 0.1 K/uL Final  . Immature Granulocytes 06/01/2019 0  % Final  . Abs Immature Granulocytes 06/01/2019 0.07  0.00 - 0.07 K/uL Final   Performed at [facility], [place] [address], [place], [place] [address]  . Sodium 06/01/2019 142  135 - 145 mmol/L Final  . Potassium 06/01/2019 4.0  3.5 - 5.1 mmol/L Final  . Chloride 06/01/2019 105  98 -  111 mmol/L Final  . CO2 06/01/2019 30  22 - 32 mmol/L Final  . Glucose, Bld 06/01/2019 136* 70 - 99 mg/dL Final   Glucose reference range applies only to samples taken after fasting for at least 8 hours.  . BUN 06/01/2019 20  8 - 23 mg/dL Final  . Creatinine, Ser 06/01/2019 0.92  0.61 - 1.24 mg/dL Final  . Calcium 06/01/2019 8.8* 8.9 - 10.3 mg/dL Final  . Total Protein 06/01/2019 6.1* 6.5 - 8.1 g/dL Final  . Albumin 06/01/2019 3.8  3.5 - 5.0 g/dL Final  . AST 06/01/2019 18  15 - 41 U/L Final  . ALT 06/01/2019 11  0 - 44 U/L Final  . Alkaline Phosphatase 06/01/2019 48  38 - 126 U/L Final  . Total Bilirubin 06/01/2019 0.3  0.3 - 1.2 mg/dL Final  . GFR calc non  Af Amer 06/01/2019 >60  >60 mL/min Final  . GFR calc Af Amer 06/01/2019 >60  >60 mL/min Final  . Anion gap 06/01/2019 7  5 - 15 Final   Performed at [facility], [place] [address], [place], [place] [address]  . Troponin I (High Sensitivity) 06/01/2019 7  <18 ng/L Final   Comment: (NOTE) Elevated high sensitivity troponin I (hsTnI) values and significant  changes across serial measurements may suggest ACS but many other  chronic and acute conditions are known to elevate hsTnI results.  Refer to the "Links" section for chest pain algorithms and additional  guidance. Performed at [facility], [place] [address] [place], [place] [address]   Admission on 05/31/2019, Discharged on 06/01/2019  Component Date Value Ref Range Status  . WBC 06/01/2019 14.5* 4.0 - 10.5 K/uL Final  . RBC 06/01/2019 3.56* 4.22 - 5.81 MIL/uL Final  . Hemoglobin 06/01/2019 11.4* 13.0 - 17.0 g/dL Final  . HCT 06/01/2019 35.1* 39.0 - 52.0 % Final  . MCV 06/01/2019 98.6  80.0 - 100.0 fL Final  . MCH 06/01/2019 32.0  26.0 - 34.0 pg Final  . MCHC 06/01/2019 32.5  30.0 - 36.0 g/dL Final  . RDW 06/01/2019 12.9  11.5 - 15.5 % Final  . Platelets 06/01/2019 128* 150 - 400 K/uL Final  . nRBC 06/01/2019 0.0  0.0 - 0.2 % Final   Performed at [facility], [place] [address], [place], [place] [address]  . Sodium 06/01/2019 137  135 - 145 mmol/L Final  . Potassium 06/01/2019 4.2  3.5 - 5.1 mmol/L Final  . Chloride 06/01/2019 106  98 - 111 mmol/L Final  . CO2 06/01/2019 25  22 - 32 mmol/L Final  . Glucose, Bld 06/01/2019 164* 70 - 99 mg/dL Final   Glucose reference range applies only to samples taken after fasting for at least 8 hours.  . BUN 06/01/2019 18  8 - 23 mg/dL Final  . Creatinine, Ser 06/01/2019 0.82  0.61 - 1.24 mg/dL Final  . Calcium 06/01/2019 8.5* 8.9 - 10.3 mg/dL Final  . GFR calc non Af Amer 06/01/2019 >60  >60 mL/min Final  . GFR calc Af Amer 06/01/2019 >60  >60  mL/min Final  . Anion gap 06/01/2019 6  5 - 15 Final   Performed at [facility], [place] [address], [place], [place] [address]  Hospital Outpatient Visit on 05/27/2019  Component Date Value Ref Range Status  . SARS Coronavirus 2 05/27/2019 NEGATIVE  NEGATIVE Final   Comment: (NOTE) SARS-CoV-2 target nucleic acids are NOT DETECTED. The SARS-CoV-2 RNA is generally  detectable in upper and lower respiratory specimens during the acute phase of infection. Negative results do not preclude SARS-CoV-2 infection, do not rule out co-infections with other pathogens, and should not be used as the sole basis for treatment or other patient management decisions. Negative results must be combined with clinical observations, patient history, and epidemiological information. The expected result is Negative. Fact Sheet for Patients: SugarRoll.be Fact Sheet for Healthcare Providers: https://www.woods-mathews.com/ This test is not yet approved or cleared by the [place] FDA and  has been authorized for detection and/or diagnosis of SARS-CoV-2 by FDA under an Emergency Use Authorization (EUA). This EUA will remain  in effect (meaning this test can be used) for the duration of the COVID-19 declaration under Section 56                          4(b)(1) of the Act, 21 U.S.C. section 360bbb-3(b)(1), unless the authorization is terminated or revoked sooner. Performed at [facility], [place] [address] [place], [place] [address]   Hospital Outpatient Visit on 05/24/2019  Component Date Value Ref Range Status  . MRSA, PCR 05/24/2019 NEGATIVE  NEGATIVE Final  . Staphylococcus aureus 05/24/2019 NEGATIVE  NEGATIVE Final   Comment: (NOTE) The Xpert SA Assay (FDA approved for NASAL specimens in patients 64 years of age and older), is one component of a comprehensive surveillance program. It is not intended to diagnose infection nor to guide or  monitor treatment. Performed at [facility], [place] [address], [place], [place] [address]   . aPTT 05/24/2019 28  24 - 36 seconds Final   Performed at [facility], [place] [address], [place], [place] [address]  . WBC 05/24/2019 6.7  4.0 - 10.5 K/uL Final  . RBC 05/24/2019 4.43  4.22 - 5.81 MIL/uL Final  . Hemoglobin 05/24/2019 14.2  13.0 - 17.0 g/dL Final  . HCT 05/24/2019 44.1  39.0 - 52.0 % Final  . MCV 05/24/2019 99.5  80.0 - 100.0 fL Final  . MCH 05/24/2019 32.1  26.0 - 34.0 pg Final  . MCHC 05/24/2019 32.2  30.0 - 36.0 g/dL Final  . RDW 05/24/2019 13.0  11.5 - 15.5 % Final  . Platelets 05/24/2019 154  150 - 400 K/uL Final  . nRBC 05/24/2019 0.0  0.0 - 0.2 % Final   Performed at [facility], [place] [address], [place],  [address]  . Sodium 05/24/2019 142  135 - 145 mmol/L Final  . Potassium 05/24/2019 4.8  3.5 - 5.1 mmol/L Final  . Chloride 05/24/2019 106  98 - 111 mmol/L Final  . CO2 05/24/2019 26  22 - 32 mmol/L Final  . Glucose, Bld 05/24/2019 130* 70 - 99 mg/dL Final   Glucose reference range applies only to samples taken after fasting for at least 8 hours.  . BUN 05/24/2019 24* 8 - 23 mg/dL Final  . Creatinine, Ser 05/24/2019 0.79  0.61 - 1.24 mg/dL Final  . Calcium 05/24/2019 9.3  8.9 - 10.3 mg/dL Final  . Total Protein 05/24/2019 7.0  6.5 - 8.1 g/dL Final  . Albumin 05/24/2019 4.5  3.5 - 5.0 g/dL Final  . AST 05/24/2019 21  15 - 41 U/L Final  . ALT 05/24/2019 18  0 - 44 U/L Final  . Alkaline Phosphatase 05/24/2019 52  38 - 126 U/L Final  . Total Bilirubin 05/24/2019 0.8  0.3 - 1.2 mg/dL Final  . GFR calc  non Af Amer 05/24/2019 >60  >60 mL/min Final  . GFR calc Af Amer 05/24/2019 >60  >60 mL/min Final  . Anion gap 05/24/2019 10  5 - 15 Final   Performed at [facility], [place] [address] [place], [place] [address]  . Prothrombin Time 05/24/2019 13.6  11.4 - 15.2 seconds Final  . INR 05/24/2019  1.1  0.8 - 1.2 Final   Comment: (NOTE) INR goal varies based on device and disease states. Performed at [facility], [place] [address], [place], [place] [address]   . ABO/RH(D) 05/24/2019 A POS   Final  . Antibody Screen 05/24/2019 NEG   Final  . Sample Expiration 05/24/2019 06/03/2019,2359   Final  . Extend sample reason 05/24/2019    Final                   Value:NO TRANSFUSIONS OR PREGNANCY IN THE PAST 3 MONTHS Performed at [place] [address] [place], [place] [address]   Office Visit on 04/13/2019  Component Date Value Ref Range Status  . Specific Gravity, UA 04/13/2019 >1.030* 1.005 - 1.030 Final  . pH, UA 04/13/2019 5.5  5.0 - 7.5 Final  . Color, UA 04/13/2019 Yellow  Yellow Final  . Appearance Ur 04/13/2019 Clear  Clear Final  . Leukocytes,UA 04/13/2019 Negative  Negative Final  . Protein,UA 04/13/2019 Negative  Negative/Trace Final  . Glucose, UA 04/13/2019 Negative  Negative Final  . Ketones, UA 04/13/2019 Negative  Negative Final  . RBC, UA 04/13/2019 Negative  Negative Final  . Bilirubin, UA 04/13/2019 Negative  Negative Final  . Urobilinogen, Ur 04/13/2019 0.2  0.2 - 1.0 mg/dL Final  . Nitrite, UA 04/13/2019 Negative  Negative Final  Appointment on 04/08/2019  Component Date Value Ref Range Status  . Glucose 04/08/2019 92  65 - 99 mg/dL Final  . BUN 04/08/2019 18  8 - 27 mg/dL Final  . Creatinine, Ser 04/08/2019 0.89  0.76 - 1.27 mg/dL Final  . GFR calc non Af Amer 04/08/2019 83  >59 mL/min/1.73 Final  . GFR calc Af Amer 04/08/2019 96  >59 mL/min/1.73 Final  . BUN/Creatinine Ratio 04/08/2019 20  10 - 24 Final  . Sodium 04/08/2019 144  134 - 144 mmol/L Final  . Potassium 04/08/2019 4.2  3.5 - 5.2 mmol/L Final  . Chloride 04/08/2019 106  96 - 106 mmol/L Final  . CO2 04/08/2019 23  20 - 29 mmol/L Final  . Calcium 04/08/2019 9.5  8.6 - 10.2 mg/dL Final  . Total Protein 04/08/2019 6.6  6.0 - 8.5 g/dL Final  . Albumin  04/08/2019 4.6  3.7 - 4.7 g/dL Final  . Globulin, Total 04/08/2019 2.0  1.5 - 4.5 g/dL Final  . Albumin/Globulin Ratio 04/08/2019 2.3* 1.2 - 2.2 Final  . Bilirubin Total 04/08/2019 0.6  0.0 - 1.2 mg/dL Final  . Alkaline Phosphatase 04/08/2019 67  39 - 117 IU/L Final  . AST 04/08/2019 23  0 - 40 IU/L Final  . ALT 04/08/2019 17  0 - 44 IU/L Final  . TSH 04/08/2019 3.270  0.450 - 4.500 uIU/mL Final  . Cholesterol, Total 04/08/2019 127  100 - 199 mg/dL Final  . Triglycerides 04/08/2019 99  0 - 149 mg/dL Final  . HDL 04/08/2019 51  >39 mg/dL Final  . VLDL Cholesterol Cal 04/08/2019 18  5 - 40 mg/dL Final  . LDL Chol Calc (NIH) 04/08/2019 58  0 - 99 mg/dL Final  .  Chol/HDL Ratio 04/08/2019 2.5  0.0 - 5.0 ratio Final   Comment:                                   T. Chol/HDL Ratio                                             Men  Women                               1/2 Avg.Risk  3.4    3.3                                   Avg.Risk  5.0    4.4                                2X Avg.Risk  9.6    7.1                                3X Avg.Risk 23.4   11.0      X-Rays:No results found.  EKG: Orders placed or performed in visit on 03/30/19  . EKG 12-Lead     Hospital Course: [name] [name] is a 79 y.o. who was admitted to [facility]. They were brought to the operating room on 05/31/2019 and underwent Procedure(s): TOTAL KNEE ARTHROPLASTY.  Patient tolerated the procedure well and was later transferred to the recovery room and then to the orthopaedic floor for postoperative care. They were given PO and IV analgesics for pain control following their surgery. They were given 24 hours of postoperative antibiotics of  Anti-infectives (From admission, onward)   Start     Dose/Rate Route Frequency Ordered Stop   05/31/19 1800  ceFAZolin (ANCEF) IVPB 2g/100 mL premix     2 g 200 mL/hr over 30 Minutes Intravenous Every 6 hours 05/31/19 1507 06/01/19 0024   05/31/19 0945  ceFAZolin (ANCEF)  IVPB 2g/100 mL premix     2 g 200 mL/hr over 30 Minutes Intravenous On call to O.R. 05/31/19 0932 05/31/19 1227     and started on DVT prophylaxis in the form of Xarelto.   PT and OT were ordered for total joint protocol. Discharge planning consulted to help with postop disposition and equipment needs.  Patient had a good night on the evening of surgery. They started to get up OOB with therapy on POD #0. Pt was seen during rounds and was ready to go home pending progress with therapy. Hemovac drain was pulled without difficulty. He worked with therapy on POD #1 and was meeting his goals. Pt was discharged to home later that day in stable condition.  Diet: Cardiac diet Activity: WBAT Follow-up: in 2 weeks Disposition: Home with outpatient physical therapy at [facility] in [place] Discharged Condition: stable   Discharge Instructions    Call MD / Call 911   Complete by: As directed    If you experience chest pain or shortness of breath, CALL 911 and be transported to the hospital emergency room.  If  you develope a fever above 101 F, pus (white drainage) or increased drainage or redness at the wound, or calf pain, call your surgeon's office.   Change dressing   Complete by: As directed    You may remove the bulky bandage (ACE wrap and gauze) two days after surgery. You will have an adhesive waterproof bandage underneath. Leave this in place until your first follow-up appointment.   Constipation Prevention   Complete by: As directed    Drink plenty of fluids.  Prune juice may be helpful.  You may use a stool softener, such as Colace (over the counter) 100 mg twice a day.  Use MiraLax (over the counter) for constipation as needed.   Diet - low sodium heart healthy   Complete by: As directed    Do not put a pillow under the knee. Place it under the heel.   Complete by: As directed    Driving restrictions   Complete by: As directed    No driving for two weeks   TED hose   Complete by: As directed      Use stockings (TED hose) for three weeks on both leg(s).  You may remove them at night for sleeping.   Weight bearing as tolerated   Complete by: As directed      Allergies as of 06/01/2019      Reactions   Levofloxacin    Caused C- Diff   Crestor [rosuvastatin] Other (See Comments)   Liver problems   Vytorin [ezetimibe-simvastatin] Other (See Comments)   Liver problems      Medication List    STOP taking these medications   aspirin EC 81 MG tablet   meloxicam 15 MG tablet Commonly known as: MOBIC   multivitamin with minerals Tabs tablet   Turmeric Curcumin Caps   vitamin B-12 1000 MCG tablet Commonly known as: CYANOCOBALAMIN   Vitamin D-3 125 MCG (5000 UT) Tabs     TAKE these medications   acetaminophen 325 MG tablet Commonly known as: TYLENOL Take 650 mg by mouth every 6 (six) hours as needed (for pain.).   ENZYME DIGEST PO Take 1 capsule by mouth at bedtime.   fluticasone 50 MCG/ACT nasal spray Commonly known as: FLONASE Place 2 sprays into both nostrils daily as needed for allergies or rhinitis.   gabapentin 100 MG capsule Commonly known as: NEURONTIN Take 200 mg three times a day for two weeks following surgery.Then take 200 mg two times a day for two weeks. Then take 200 mg once a day for two weeks. Then discontinue.   methocarbamol 500 MG tablet Commonly known as: ROBAXIN Take 1 tablet (500 mg total) by mouth every 6 (six) hours as needed for muscle spasms.   metoprolol tartrate 25 MG tablet Commonly known as: LOPRESSOR Take 1 tablet (25 mg total) by mouth 2 (two) times daily.   nitroGLYCERIN 0.4 MG SL tablet Commonly known as: NITROSTAT Place 1 tablet (0.4 mg total) under the tongue every 5 (five) minutes as needed for chest pain.   omeprazole 20 MG capsule Commonly known as: PRILOSEC Take 20 mg by mouth daily.   oxyCODONE 5 MG immediate release tablet Commonly known as: Oxy IR/ROXICODONE Take 1-2 tablets (5-10 mg total) by mouth every 6  (six) hours as needed for severe pain.   PROBIOTIC PO Take 1 capsule by mouth daily.   Repatha SureClick [id number] MG/ML Soaj Generic drug: Evolocumab Inject 140 mLs into the skin every 14 (fourteen) days.   rivaroxaban 10  MG Tabs tablet Commonly known as: XARELTO Take 1 tablet (10 mg total) by mouth daily with breakfast for 20 days. Then resume one 81 mg aspirin once a day.   traMADol 50 MG tablet Commonly known as: ULTRAM Take 1-2 tablets (50-100 mg total) by mouth every 6 (six) hours as needed for moderate pain.            Discharge Care Instructions  (From admission, onward)         Start     Ordered   06/01/19 0000  Weight bearing as tolerated     06/01/19 0733   06/01/19 0000  Change dressing    Comments: You may remove the bulky bandage (ACE wrap and gauze) two days after surgery. You will have an adhesive waterproof bandage underneath. Leave this in place until your first follow-up appointment.   06/01/19 [id number]         Follow-up Information    Gaynelle Arabian, MD. Schedule an appointment as soon as possible for a visit on 06/15/2019.   Specialty: Orthopedic Surgery Contact information: [address] [place] [address] [phone]           Signed: Theresa Duty, PA-C Orthopedic Surgery 06/02/2019, 9:34 AM

## 2019-06-03 ENCOUNTER — Encounter: Payer: Self-pay | Admitting: Physical Therapy

## 2019-06-03 ENCOUNTER — Other Ambulatory Visit: Payer: Self-pay

## 2019-06-03 ENCOUNTER — Ambulatory Visit: Payer: Medicare Other | Attending: Student | Admitting: Physical Therapy

## 2019-06-03 DIAGNOSIS — M25661 Stiffness of right knee, not elsewhere classified: Secondary | ICD-10-CM | POA: Diagnosis present

## 2019-06-03 DIAGNOSIS — M25662 Stiffness of left knee, not elsewhere classified: Secondary | ICD-10-CM | POA: Diagnosis present

## 2019-06-03 DIAGNOSIS — M25561 Pain in right knee: Secondary | ICD-10-CM | POA: Diagnosis present

## 2019-06-03 DIAGNOSIS — R262 Difficulty in walking, not elsewhere classified: Secondary | ICD-10-CM | POA: Diagnosis present

## 2019-06-03 DIAGNOSIS — R600 Localized edema: Secondary | ICD-10-CM | POA: Insufficient documentation

## 2019-06-03 DIAGNOSIS — M25562 Pain in left knee: Secondary | ICD-10-CM | POA: Diagnosis present

## 2019-06-03 NOTE — Patient Instructions (Signed)
Access Code: [email or web address] URL: [email or web address] Date: 06/03/2019 Prepared by: Kearney Hard  Exercises Long Sitting Quad Set with Towel Roll Under Heel - 2-3 x daily - 7 x weekly - 3 sets - 10 reps Supine Heel Slide with Strap - 2-3 x daily - 7 x weekly - 3 sets - 10 reps Supine Straight Leg Raises - 2-3 x daily - 7 x weekly - 3 sets - 10 reps Seated Knee Flexion Extension AROM - 2-3 x daily - 7 x weekly - 3 sets - 10 reps Prone Knee Extension Hang - 2-3 x daily - 7 x weekly - 3-5 mintues hold

## 2019-06-03 NOTE — Therapy (Signed)
[facility] [place], [place], [address] Phone: [phone]   Fax:  [phone]  Physical Therapy Evaluation  Patient Details  Name: [name] [name] MRN: [id number] Date of Birth: 1940/11/12 Referring Provider (PT): Dr. Gaynelle Arabian   Encounter Date: 06/03/2019  PT End of Session - 06/03/19 1218    Visit Number  1    Number of Visits  17    Date for PT Re-Evaluation  08/03/19    PT Start Time  1030    PT Stop Time  1120    PT Time Calculation (min)  50 min    Activity Tolerance  Patient tolerated treatment well    Behavior During Therapy  [facility] for tasks assessed/performed       Past Medical History:  Diagnosis Date  . AAA (abdominal aortic aneurysm) ([place])    gets abd [place] every 3 years to watch,last check 08/2018  . Arthritis   . BPH (benign prostatic hyperplasia)   . CAD (coronary artery disease)   . Colon polyps   . Dyslipidemia   . GERD (gastroesophageal reflux disease)   . Heart murmur   . Herniated disc   . Hiatal hernia   . History of kidney stones   . HTN (hypertension)   . Hyperlipidemia   . OSA on CPAP   . Tinnitus     Past Surgical History:  Procedure Laterality Date  . COLONOSCOPY    . CORONARY ARTERY BYPASS GRAFT  2006   LIMA to LAD, SVG to RCA, SVG to circumflex, SVG to diagonal   . mole removed  2003   stomach  . right knee surgery  2008  . TONSILLECTOMY    . TOTAL KNEE ARTHROPLASTY Right 04/27/2018   Procedure: TOTAL KNEE ARTHROPLASTY RIGHT;  Surgeon: Gaynelle Arabian, MD;  Location: [facility];  Service: Orthopedics;  Laterality: Right;  73min  . TOTAL KNEE ARTHROPLASTY Left 05/31/2019   Procedure: TOTAL KNEE ARTHROPLASTY;  Surgeon: Gaynelle Arabian, MD;  Location: [facility];  Service: Orthopedics;  Laterality: Left;  44min  . UPPER GI ENDOSCOPY      There were no vitals filed for this visit.   Subjective Assessment - 06/03/19 1036    Subjective  Pt arriving today for PT evaluation s/p L  TKA  on 05/31/2019. Pt amb with rolling walking. Pt reporting pain of 5/10 with movement and walking. Pt reporting no pain at rest.    Pertinent History  s/p L TKA on 05/31/2019, R TKA 04/27/2018    Patient Stated Goals  Walk without device with no pain.    Currently in Pain?  Yes    Pain Score  5     Pain Location  Knee    Pain Orientation  Left    Pain Descriptors / Indicators  Aching;Sore    Pain Type  Surgical pain    Pain Onset  In the past 7 days    Pain Frequency  Intermittent    Aggravating Factors   bending, walking    Pain Relieving Factors  resting, ice    Effect of Pain on Daily Activities  limited donning socks/shoes, bending, walking         [facility] PT Assessment - 06/03/19 0001      Assessment   Medical Diagnosis  L TKA, 05/31/2019    Referring Provider (PT)  Dr. Gaynelle Arabian    Onset Date/Surgical Date  05/31/19    Hand Dominance  Right    Prior Therapy  yes following R TKA      Precautions   Precautions  None      Restrictions   Weight Bearing Restrictions  No      Balance Screen   Has the patient fallen in the past 6 months  No      [place] residence    Living Arrangements  Spouse/significant other    Type of [place] to enter    Entrance Stairs-Number of Steps  5    Entrance Stairs-Rails  Right    Home Layout  Two level    Alternate Level Stairs-Number of Steps  15    Alternate Level Stairs-Rails  Right      Prior Function   Level of Independence  Independent    Vocation  Retired    Science writer   Overall Cognitive Status  Within Functional Limits for tasks assessed      Observation/Other Assessments-Edema    Edema  --   L knee mid patella 50.5 centimeters, R knee 46 centimeters     Posture/Postural Control   Posture/Postural Control  Postural limitations    Postural Limitations  Rounded Shoulders;Forward head      ROM / Strength   AROM / PROM /  Strength  AROM;Strength      AROM   AROM Assessment Site  Knee    Right/Left Knee  Right;Left    Right Knee Extension  0    Right Knee Flexion  128    Left Knee Extension  15    Left Knee Flexion  95      Strength   Overall Strength  Deficits    Strength Assessment Site  Knee    Right/Left Knee  Right;Left    Right Knee Flexion  5/5    Right Knee Extension  5/5    Left Knee Flexion  3/5    Left Knee Extension  3/5      Palpation   Patella mobility  limited by edema     Palpation comment  TTP: medial and lateral tibia/ joint line      Transfers   Five time sit to stand comments   28 seconds using RW and UE's       Ambulation/Gait   Assistive device  Rolling walker    Gait Pattern  Decreased hip/knee flexion - left;Decreased dorsiflexion - left;Decreased weight shift to left;Antalgic                Objective measurements completed on examination: See above findings.      [facility] Adult PT Treatment/Exercise - 06/03/19 0001      Exercises   Exercises  Knee/Hip      Knee/Hip Exercises: Stretches   Active Hamstring Stretch  Left;1 rep      Knee/Hip Exercises: Seated   Long Arc Quad  AROM;Left;5 reps    Heel Slides  AROM;Left;10 reps      Knee/Hip Exercises: Supine   Quad Sets  AROM;Strengthening;Left;10 reps      Modalities   Modalities  Vasopneumatic      Vasopneumatic   Number Minutes Vasopneumatic   10 minutes    Vasopnuematic Location   Knee    Vasopneumatic Pressure  Medium    Vasopneumatic Temperature   34      Manual Therapy   Manual Therapy  Passive ROM    Passive  ROM  L knee flexion and extension with over pressures             PT Education - 06/03/19 1045    Education Details  PT POC, HEP    Person(s) Educated  Patient    Methods  Explanation;Demonstration;Other (comment);Handout    Comprehension  Verbalized understanding;Returned demonstration          PT Long Term Goals - 06/03/19 1103      PT LONG TERM GOAL #1   Title   Patient will be independent with advanced HEP    Time  6    Period  Weeks    Status  New      PT LONG TERM GOAL #2   Title  Patient will demonstrate 120+ degrees of right knee flexion AROM to improve ability to perform functional tasks.     Time  6    Period  Weeks    Status  New      PT LONG TERM GOAL #3   Title  Patient will demonstrate 0 degrees of right knee extension AROM to improve gait mechanics.     Time  6    Period  Weeks    Status  New      PT LONG TERM GOAL #4   Title  Patient will negotiate steps with a reciprocating gait pattern with one rail to safely access bedroom.     Time  6    Period  Weeks    Status  New      PT LONG TERM GOAL #5   Title  Patient will ambulate community distances without an AD and right knee pain less than 3/10.    Time  6    Period  Weeks    Status  New             Plan - 06/03/19 1218    Clinical Impression Statement  Pt presenting to therapy s/p L TKA on 05/31/2019. Pt presenting with weakness noted in L LE compared to R with AROM arc 15-95 degrees. Edema noted in left knee circumference. Pt amb with rolling walker 3 days post op with step to gait pattern. Pt could benefit from skilled PT to progress toward his PLOF.    Personal Factors and Comorbidities  Comorbidity 3+    Comorbidities  BPH, CAD, GERD,  heart murmur, R TKA 04/27/2018, herinated disc HTN, colon polyps, CABG 2006    Examination-Activity Limitations  Squat;Stairs;Stand;Transfers;Sit    Examination-Participation Restrictions  Driving;Community Activity;Yard Work;Other    Stability/Clinical Decision Making  Stable/Uncomplicated    Clinical Decision Making  Low    Rehab Potential  Good    PT Frequency  2x / week    PT Duration  8 weeks    PT Treatment/Interventions  ADLs/Self Care Home Management;Cryotherapy;Electrical Stimulation;Ultrasound;Gait training;Stair training;Functional mobility training;Therapeutic activities;Therapeutic exercise;Balance  training;Neuromuscular re-education;Patient/family education;Manual techniques;Passive range of motion;Taping    PT Next Visit Plan  Bike, TKA protocol, Vaso and other modalities as needed. Gait training, standing exercises    PT Home Exercise Plan  see pt insructions    Consulted and Agree with Plan of Care  Patient       Patient will benefit from skilled therapeutic intervention in order to improve the following deficits and impairments:  Pain, Increased edema, Decreased mobility, Difficulty walking, Decreased range of motion, Decreased activity tolerance, Decreased strength, Impaired flexibility  Visit Diagnosis: Stiffness of left knee, not elsewhere classified  Acute pain of left knee  Difficulty in walking, not elsewhere classified  Localized edema     Problem List Patient Active Problem List   Diagnosis Date Noted  . Osteoarthritis, knee 04/28/2018  . OA (osteoarthritis) of knee 04/27/2018  . Osteoarthritis of knee 05/08/2017  . Tinnitus of both ears 09/10/2016  . Ventral hernia without obstruction or gangrene 03/28/2016  . AAA (abdominal aortic aneurysm) without rupture ([place]) 07/20/2015  . Obstructive sleep apnea 03/17/2014  . Erectile dysfunction due to arterial insufficiency 01/12/2014  . Thrombocytopenia ([place]) 12/30/2013  . Allergic rhinitis 10/01/2012  . BPH (benign prostatic hyperplasia) 10/01/2012  . PVC's (premature ventricular contractions) 09/01/2009  . COLONIC POLYPS 04/14/2009  . Hyperlipidemia LDL goal <70 04/14/2009  . Essential hypertension 04/14/2009  . Coronary atherosclerosis 04/14/2009  . HIATAL HERNIA 04/14/2009  . HERNIATED DISC 04/14/2009    Oretha Caprice, MPT 06/03/2019, 12:27 PM  [facility] [address] [place], [place], [address] Phone: [phone]   Fax:  [phone]  Name: Randolf Feick MRN: [id number] Date of Birth: [birth year]

## 2019-06-08 ENCOUNTER — Other Ambulatory Visit: Payer: Self-pay

## 2019-06-08 ENCOUNTER — Ambulatory Visit: Payer: Medicare Other | Admitting: Physical Therapy

## 2019-06-08 DIAGNOSIS — M25562 Pain in left knee: Secondary | ICD-10-CM

## 2019-06-08 DIAGNOSIS — R262 Difficulty in walking, not elsewhere classified: Secondary | ICD-10-CM

## 2019-06-08 DIAGNOSIS — M25662 Stiffness of left knee, not elsewhere classified: Secondary | ICD-10-CM

## 2019-06-08 NOTE — Therapy (Signed)
[facility] [place], [place], [address] Phone: [phone]   Fax:  [phone]  Physical Therapy Treatment  Patient Details  Name: [name] [name] MRN: [id number] Date of Birth: [birth year] Referring Provider (PT): Dr. Gaynelle Arabian   Encounter Date: 06/08/2019  PT End of Session - 06/08/19 1040    Visit Number  2    Number of Visits  17    Date for PT Re-Evaluation  08/03/19    PT Start Time  0948    PT Stop Time  1044    PT Time Calculation (min)  56 min    Activity Tolerance  Patient tolerated treatment well    Behavior During Therapy  [facility] for tasks assessed/performed       Past Medical History:  Diagnosis Date  . AAA (abdominal aortic aneurysm) ([place])    gets abd [place] every 3 years to watch,last check 08/2018  . Arthritis   . BPH (benign prostatic hyperplasia)   . CAD (coronary artery disease)   . Colon polyps   . Dyslipidemia   . GERD (gastroesophageal reflux disease)   . Heart murmur   . Herniated disc   . Hiatal hernia   . History of kidney stones   . HTN (hypertension)   . Hyperlipidemia   . OSA on CPAP   . Tinnitus     Past Surgical History:  Procedure Laterality Date  . COLONOSCOPY    . CORONARY ARTERY BYPASS GRAFT  2006   LIMA to LAD, SVG to RCA, SVG to circumflex, SVG to diagonal   . mole removed  2003   stomach  . right knee surgery  2008  . TONSILLECTOMY    . TOTAL KNEE ARTHROPLASTY Right 04/27/2018   Procedure: TOTAL KNEE ARTHROPLASTY RIGHT;  Surgeon: Gaynelle Arabian, MD;  [facility];  Service: Orthopedics;  Laterality: Right;  23min  . TOTAL KNEE ARTHROPLASTY Left 05/31/2019   Procedure: TOTAL KNEE ARTHROPLASTY;  Surgeon: Gaynelle Arabian, MD;  [facility];  Service: Orthopedics;  Laterality: Left;  7min  . UPPER GI ENDOSCOPY      There were no vitals filed for this visit.  Subjective Assessment - 06/08/19 0958    Subjective  COVID-19 screen performed prior to patient entering  clinic.  No new complaints.    Pertinent History  s/p L TKA on 05/31/2019, R TKA 04/27/2018    Patient Stated Goals  Walk without device with no pain.    Currently in Pain?  Yes    Pain Score  5     Pain Location  Knee    Pain Orientation  Left    Pain Descriptors / Indicators  Aching;Sore    Pain Type  Surgical pain    Pain Onset  In the past 7 days                       [facility] Adult PT Treatment/Exercise - 06/08/19 0001      Exercises   Exercises  Knee/Hip      Knee/Hip Exercises: Aerobic   Nustep  Level 1 x 15 minutes moving forward x 2 to increase flexion.      Knee/Hip Exercises: Supine   Short Arc Quad Sets Limitations  Non-resisted SAQ's x 15 minutes facilttaed with VMS with 10 sec extension and 10 sec rest.      Modalities   Modalities  Vasopneumatic      Vasopneumatic   Number Minutes Vasopneumatic  20 minutes    Vasopnuematic Location   --   Left knee.   Vasopneumatic Pressure  Low                  PT Long Term Goals - 06/03/19 1103      PT LONG TERM GOAL #1   Title  Patient will be independent with advanced HEP    Time  6    Period  Weeks    Status  New      PT LONG TERM GOAL #2   Title  Patient will demonstrate 120+ degrees of right knee flexion AROM to improve ability to perform functional tasks.     Time  6    Period  Weeks    Status  New      PT LONG TERM GOAL #3   Title  Patient will demonstrate 0 degrees of right knee extension AROM to improve gait mechanics.     Time  6    Period  Weeks    Status  New      PT LONG TERM GOAL #4   Title  Patient will negotiate steps with a reciprocating gait pattern with one rail to safely access bedroom.     Time  6    Period  Weeks    Status  New      PT LONG TERM GOAL #5   Title  Patient will ambulate community distances without an AD and right knee pain less than 3/10.    Time  6    Period  Weeks    Status  New            Plan - 06/08/19 1038    Clinical Impression  Statement  Patient did very well today.  Excellent left quad activation with VMS today.  He achieved passive flexion to 100 degrees today.    Personal Factors and Comorbidities  Comorbidity 3+    Comorbidities  BPH, CAD, GERD,  heart murmur, R TKA 04/27/2018, herinated disc HTN, colon polyps, CABG 2006    Examination-Activity Limitations  Squat;Stairs;Stand;Transfers;Sit    Examination-Participation Restrictions  Driving;Community Activity;Yard Work;Other    Rehab Potential  Good    PT Frequency  2x / week    PT Duration  8 weeks    PT Treatment/Interventions  ADLs/Self Care Home Management;Cryotherapy;Electrical Stimulation;Ultrasound;Gait training;Stair training;Functional mobility training;Therapeutic activities;Therapeutic exercise;Balance training;Neuromuscular re-education;Patient/family education;Manual techniques;Passive range of motion;Taping    PT Next Visit Plan  Bike, TKA protocol, Vaso and other modalities as needed. Gait training, standing exercises    PT Home Exercise Plan  see pt insructions    Consulted and Agree with Plan of Care  Patient       Patient will benefit from skilled therapeutic intervention in order to improve the following deficits and impairments:  Pain, Increased edema, Decreased mobility, Difficulty walking, Decreased range of motion, Decreased activity tolerance, Decreased strength, Impaired flexibility  Visit Diagnosis: Stiffness of left knee, not elsewhere classified  Acute pain of left knee  Difficulty in walking, not elsewhere classified     Problem List Patient Active Problem List   Diagnosis Date Noted  . Osteoarthritis, knee 04/28/2018  . OA (osteoarthritis) of knee 04/27/2018  . Osteoarthritis of knee 05/08/2017  . Tinnitus of both ears 09/10/2016  . Ventral hernia without obstruction or gangrene 03/28/2016  . AAA (abdominal aortic aneurysm) without rupture ([place]) 07/20/2015  . Obstructive sleep apnea 03/17/2014  . Erectile dysfunction due  to arterial insufficiency 01/12/2014  .  Thrombocytopenia ([place]) 12/30/2013  . Allergic rhinitis 10/01/2012  . BPH (benign prostatic hyperplasia) 10/01/2012  . PVC's (premature ventricular contractions) 09/01/2009  . COLONIC POLYPS 04/14/2009  . Hyperlipidemia LDL goal <70 04/14/2009  . Essential hypertension 04/14/2009  . Coronary atherosclerosis 04/14/2009  . HIATAL HERNIA 04/14/2009  . HERNIATED DISC 04/14/2009    Sheera Illingworth, [place] MPT 06/08/2019, 10:44 AM  [facility] [address] [place], [place], [address] Phone: [phone]   Fax:  [phone]  Name: [name] [name] MRN: [id number] Date of Birth: [birth year]

## 2019-06-10 ENCOUNTER — Ambulatory Visit: Payer: Medicare Other | Admitting: Physical Therapy

## 2019-06-10 ENCOUNTER — Other Ambulatory Visit: Payer: Self-pay

## 2019-06-10 DIAGNOSIS — M25662 Stiffness of left knee, not elsewhere classified: Secondary | ICD-10-CM

## 2019-06-10 DIAGNOSIS — M25562 Pain in left knee: Secondary | ICD-10-CM

## 2019-06-10 NOTE — Therapy (Signed)
[facility] [place], [place], [address] Phone: [phone]   Fax:  [phone]  Physical Therapy Treatment  Patient Details  Name: Azende Alger MRN: [id number] Date of Birth: 01-Jun-1940 Referring Provider (PT): Dr. Gaynelle Arabian   Encounter Date: 06/10/2019  PT End of Session - 06/10/19 1352    Visit Number  3    Number of Visits  17    Date for PT Re-Evaluation  08/03/19    PT Start Time  0101    PT Stop Time  0159    PT Time Calculation (min)  58 min    Activity Tolerance  Patient tolerated treatment well    Behavior During Therapy  [facility] for tasks assessed/performed       Past Medical History:  Diagnosis Date  . AAA (abdominal aortic aneurysm) ([place])    gets abd [place] every 3 years to watch,last check 08/2018  . Arthritis   . BPH (benign prostatic hyperplasia)   . CAD (coronary artery disease)   . Colon polyps   . Dyslipidemia   . GERD (gastroesophageal reflux disease)   . Heart murmur   . Herniated disc   . Hiatal hernia   . History of kidney stones   . HTN (hypertension)   . Hyperlipidemia   . OSA on CPAP   . Tinnitus     Past Surgical History:  Procedure Laterality Date  . COLONOSCOPY    . CORONARY ARTERY BYPASS GRAFT  2006   LIMA to LAD, SVG to RCA, SVG to circumflex, SVG to diagonal   . mole removed  2003   stomach  . right knee surgery  2008  . TONSILLECTOMY    . TOTAL KNEE ARTHROPLASTY Right 04/27/2018   Procedure: TOTAL KNEE ARTHROPLASTY RIGHT;  Surgeon: Gaynelle Arabian, MD;  [facility];  Service: Orthopedics;  Laterality: Right;  36min  . TOTAL KNEE ARTHROPLASTY Left 05/31/2019   Procedure: TOTAL KNEE ARTHROPLASTY;  Surgeon: Gaynelle Arabian, MD;  [facility];  Service: Orthopedics;  Laterality: Left;  93min  . UPPER GI ENDOSCOPY      There were no vitals filed for this visit.  Subjective Assessment - 06/10/19 1321    Subjective  COVID-19 screen performed prior to patient entering  clinic.  Didn't try the prone hang yet.    Pertinent History  s/p L TKA on 05/31/2019, R TKA 04/27/2018    Patient Stated Goals  Walk without device with no pain.    Currently in Pain?  Yes    Pain Score  5     Pain Location  Knee    Pain Orientation  Left    Pain Descriptors / Indicators  Aching;Sore    Pain Type  Surgical pain    Pain Onset  1 to 4 weeks ago                       [facility] Adult PT Treatment/Exercise - 06/10/19 0001      Exercises   Exercises  Knee/Hip      Knee/Hip Exercises: Aerobic   Nustep  Level 3 x 15 minutes       Knee/Hip Exercises: Supine   Short Arc Quad Sets Limitations  Non-resisted SAQ's facilitated with VMS, 4 electrode on co-contract with 10 sec extension holds and 10 sec rest x 15 minutes.      Modalities   Modalities  Vasopneumatic      Vasopneumatic   Number Minutes  Vasopneumatic   15 minutes    Vasopnuematic Location   --   Left knee.   Vasopneumatic Pressure  Medium      Manual Therapy   Manual Therapy  Passive ROM    Passive ROM  In supine:  PROM into left knee flexion and extension x 3 minutes.                  PT Long Term Goals - 06/03/19 1103      PT LONG TERM GOAL #1   Title  Patient will be independent with advanced HEP    Time  6    Period  Weeks    Status  New      PT LONG TERM GOAL #2   Title  Patient will demonstrate 120+ degrees of right knee flexion AROM to improve ability to perform functional tasks.     Time  6    Period  Weeks    Status  New      PT LONG TERM GOAL #3   Title  Patient will demonstrate 0 degrees of right knee extension AROM to improve gait mechanics.     Time  6    Period  Weeks    Status  New      PT LONG TERM GOAL #4   Title  Patient will negotiate steps with a reciprocating gait pattern with one rail to safely access bedroom.     Time  6    Period  Weeks    Status  New      PT LONG TERM GOAL #5   Title  Patient will ambulate community distances without an AD  and right knee pain less than 3/10.    Time  6    Period  Weeks    Status  New            Plan - 06/10/19 1348    Clinical Impression Statement  Very good progress with passive left knee flexion to 105 degrees today.    Personal Factors and Comorbidities  Comorbidity 3+    Comorbidities  BPH, CAD, GERD,  heart murmur, R TKA 04/27/2018, herinated disc HTN, colon polyps, CABG 2006    Examination-Activity Limitations  Squat;Stairs;Stand;Transfers;Sit    Examination-Participation Restrictions  Driving;Community Activity;Yard Work;Other    Stability/Clinical Decision Making  Stable/Uncomplicated    Rehab Potential  Good    PT Frequency  2x / week    PT Duration  8 weeks    PT Treatment/Interventions  ADLs/Self Care Home Management;Cryotherapy;Electrical Stimulation;Ultrasound;Gait training;Stair training;Functional mobility training;Therapeutic activities;Therapeutic exercise;Balance training;Neuromuscular re-education;Patient/family education;Manual techniques;Passive range of motion;Taping    PT Next Visit Plan  Bike, TKA protocol, Vaso and other modalities as needed. Gait training, standing exercises    PT Home Exercise Plan  see pt insructions    Consulted and Agree with Plan of Care  Patient       Patient will benefit from skilled therapeutic intervention in order to improve the following deficits and impairments:  Pain, Increased edema, Decreased mobility, Difficulty walking, Decreased range of motion, Decreased activity tolerance, Decreased strength, Impaired flexibility  Visit Diagnosis: Stiffness of left knee, not elsewhere classified  Acute pain of left knee     Problem List Patient Active Problem List   Diagnosis Date Noted  . Osteoarthritis, knee 04/28/2018  . OA (osteoarthritis) of knee 04/27/2018  . Osteoarthritis of knee 05/08/2017  . Tinnitus of both ears 09/10/2016  . Ventral hernia without obstruction or gangrene 03/28/2016  .  AAA (abdominal aortic aneurysm)  without rupture ([place]) 07/20/2015  . Obstructive sleep apnea 03/17/2014  . Erectile dysfunction due to arterial insufficiency 01/12/2014  . Thrombocytopenia ([place]) 12/30/2013  . Allergic rhinitis 10/01/2012  . BPH (benign prostatic hyperplasia) 10/01/2012  . PVC's (premature ventricular contractions) 09/01/2009  . COLONIC POLYPS 04/14/2009  . Hyperlipidemia LDL goal <70 04/14/2009  . Essential hypertension 04/14/2009  . Coronary atherosclerosis 04/14/2009  . HIATAL HERNIA 04/14/2009  . HERNIATED DISC 04/14/2009    Sahith Nurse, [place] MPT 06/10/2019, 2:03 PM  [facility] [address] [place], [place], [address] Phone: [phone]   Fax:  [phone]  Name: Khadin Bresette MRN: [id number] Date of Birth: 03/16/1940

## 2019-06-15 ENCOUNTER — Encounter: Payer: Self-pay | Admitting: Physical Therapy

## 2019-06-15 ENCOUNTER — Telehealth: Payer: Self-pay | Admitting: Family Medicine

## 2019-06-15 ENCOUNTER — Ambulatory Visit: Payer: Medicare Other | Admitting: Physical Therapy

## 2019-06-15 ENCOUNTER — Other Ambulatory Visit: Payer: Self-pay

## 2019-06-15 DIAGNOSIS — R600 Localized edema: Secondary | ICD-10-CM

## 2019-06-15 DIAGNOSIS — R262 Difficulty in walking, not elsewhere classified: Secondary | ICD-10-CM

## 2019-06-15 DIAGNOSIS — M25662 Stiffness of left knee, not elsewhere classified: Secondary | ICD-10-CM

## 2019-06-15 DIAGNOSIS — M25562 Pain in left knee: Secondary | ICD-10-CM

## 2019-06-15 NOTE — Chronic Care Management (AMB) (Signed)
  Chronic Care Management   Note  06/15/2019 Name: [name] [name] MRN: [id number] DOB: [birth year]  [name] [name] is a 79 y.o. year old male who is a primary care patient of [name], DO. I reached out to [place] by phone today in response to a referral sent by [name] [name] health plan.     [name] [name] was given information about Chronic Care Management services today including:  1. CCM service includes personalized support from designated clinical staff supervised by his physician, including individualized plan of care and coordination with other care providers 2. 24/7 contact phone numbers for assistance for urgent and routine care needs. 3. Service will only be billed when office clinical staff spend 20 minutes or more in a month to coordinate care. 4. Only one practitioner may furnish and bill the service in a calendar month. 5. The patient may stop CCM services at any time (effective at the end of the month) by phone call to the office staff. 6. The patient will be responsible for cost sharing (co-pay) of up to 20% of the service fee (after annual deductible is met).  Patient did not agree to enrollment in care management services and does not wish to consider at this time.  Follow up plan: The patient has been provided with contact information for the care management team and has been advised to call with any health related questions or concerns.   [place], [place] [address] Direct Dial: [phone] [email or web address]

## 2019-06-15 NOTE — Therapy (Signed)
[facility] [place], [place], [address] Phone: [phone]   Fax:  [phone]  Physical Therapy Treatment  Patient Details  Name: [name] [name] MRN: [id number] Date of Birth: August 07, 1940 Referring Provider (PT): Dr. Gaynelle Arabian   Encounter Date: 06/15/2019  PT End of Session - 06/15/19 0955    Visit Number  4    Number of Visits  17    Date for PT Re-Evaluation  08/03/19    PT Start Time  0953    PT Stop Time  1043    PT Time Calculation (min)  50 min    Equipment Utilized During Treatment  Other (comment)   SPC   Activity Tolerance  Patient tolerated treatment well    Behavior During Therapy  [facility] for tasks assessed/performed       Past Medical History:  Diagnosis Date  . AAA (abdominal aortic aneurysm) ([place])    gets abd [place] every 3 years to watch,last check 08/2018  . Arthritis   . BPH (benign prostatic hyperplasia)   . CAD (coronary artery disease)   . Colon polyps   . Dyslipidemia   . GERD (gastroesophageal reflux disease)   . Heart murmur   . Herniated disc   . Hiatal hernia   . History of kidney stones   . HTN (hypertension)   . Hyperlipidemia   . OSA on CPAP   . Tinnitus     Past Surgical History:  Procedure Laterality Date  . COLONOSCOPY    . CORONARY ARTERY BYPASS GRAFT  2006   LIMA to LAD, SVG to RCA, SVG to circumflex, SVG to diagonal   . mole removed  2003   stomach  . right knee surgery  2008  . TONSILLECTOMY    . TOTAL KNEE ARTHROPLASTY Right 04/27/2018   Procedure: TOTAL KNEE ARTHROPLASTY RIGHT;  Surgeon: Gaynelle Arabian, MD;  Location: [facility];  Service: Orthopedics;  Laterality: Right;  73min  . TOTAL KNEE ARTHROPLASTY Left 05/31/2019   Procedure: TOTAL KNEE ARTHROPLASTY;  Surgeon: Gaynelle Arabian, MD;  Location: [facility];  Service: Orthopedics;  Laterality: Left;  33min  . UPPER GI ENDOSCOPY      There were no vitals filed for this visit.  Subjective Assessment - 06/15/19 0954    Subjective  COVID-19 screen performed prior to patient entering clinic. Reports that his post surgical dressings were taken off yesterday.    Pertinent History  s/p L TKA on 05/31/2019, R TKA 04/27/2018    Patient Stated Goals  Walk without device with no pain.    Currently in Pain?  No/denies         [facility] PT Assessment - 06/15/19 0001      Assessment   Medical Diagnosis  L TKA, 05/31/2019    Referring Provider (PT)  Dr. Gaynelle Arabian    Onset Date/Surgical Date  05/31/19    Hand Dominance  Right    Next MD Visit  07/06/2019    Prior Therapy  yes following R TKA      Precautions   Precautions  None      Restrictions   Weight Bearing Restrictions  No                   [facility] Adult PT Treatment/Exercise - 06/15/19 0001      Knee/Hip Exercises: Aerobic   Nustep  L3, seat 12-8 x15 min      Knee/Hip Exercises: Standing   Forward Lunges  Left;15 reps;5  seconds    Rocker Board  3 minutes      Knee/Hip Exercises: Supine   Short Arc Quad Sets  Left;Limitations    Short Arc Quad Sets Limitations  Russian stimulatiion utilized as well       Education officer, environmental  Russian with Chief Executive Officer Parameters  Co-contract, 10/10, 50 bps, 5 sec ramp    Electrical Stimulation Goals  Neuromuscular facilitation      Vasopneumatic   Number Minutes Vasopneumatic   10 minutes    Vasopnuematic Location   Knee    Vasopneumatic Pressure  Medium    Vasopneumatic Temperature   34                  PT Long Term Goals - 06/03/19 1103      PT LONG TERM GOAL #1   Title  Patient will be independent with advanced HEP    Time  6    Period  Weeks    Status  New      PT LONG TERM GOAL #2   Title  Patient will demonstrate 120+ degrees of right knee flexion AROM to improve ability to perform functional tasks.     Time  6     Period  Weeks    Status  New      PT LONG TERM GOAL #3   Title  Patient will demonstrate 0 degrees of right knee extension AROM to improve gait mechanics.     Time  6    Period  Weeks    Status  New      PT LONG TERM GOAL #4   Title  Patient will negotiate steps with a reciprocating gait pattern with one rail to safely access bedroom.     Time  6    Period  Weeks    Status  New      PT LONG TERM GOAL #5   Title  Patient will ambulate community distances without an AD and right knee pain less than 3/10.    Time  6    Period  Weeks    Status  New            Plan - 06/15/19 1046    Clinical Impression Statement  Patient was advanced in treatment today to progress R knee flexion via Nustep and lunges. Patient is now ambulating with SPC with L knee flexion noted in stance. Deficient L quad activation noted thus russian stimulation with co-contraction continued with SAQ. No post-surgical dressing donned per MD removal. Anterior knee edema observed surrounding L patella. Great visual healing of L TKR incision. Normal modalities response noted following removal of the modalities.    Personal Factors and Comorbidities  Comorbidity 3+    Comorbidities  BPH, CAD, GERD,  heart murmur, R TKA 04/27/2018, herinated disc HTN, colon polyps, CABG 2006    Examination-Activity Limitations  Squat;Stairs;Stand;Transfers;Sit    Examination-Participation Restrictions  Driving;Community Activity;Yard Work;Other    Stability/Clinical Decision Making  Stable/Uncomplicated    Rehab Potential  Good    PT Frequency  2x / week    PT Duration  8 weeks    PT Treatment/Interventions  ADLs/Self Care Home Management;Cryotherapy;Electrical Stimulation;Ultrasound;Gait training;Stair training;Functional mobility training;Therapeutic activities;Therapeutic exercise;Balance training;Neuromuscular re-education;Patient/family education;Manual techniques;Passive range of motion;Taping    PT Next Visit Plan  Bike,  TKA  protocol, Vaso and other modalities as needed. Gait training, standing exercises    PT Home Exercise Plan  see pt insructions    Consulted and Agree with Plan of Care  Patient       Patient will benefit from skilled therapeutic intervention in order to improve the following deficits and impairments:  Pain, Increased edema, Decreased mobility, Difficulty walking, Decreased range of motion, Decreased activity tolerance, Decreased strength, Impaired flexibility  Visit Diagnosis: Stiffness of left knee, not elsewhere classified  Acute pain of left knee  Difficulty in walking, not elsewhere classified  Localized edema     Problem List Patient Active Problem List   Diagnosis Date Noted  . Osteoarthritis, knee 04/28/2018  . OA (osteoarthritis) of knee 04/27/2018  . Osteoarthritis of knee 05/08/2017  . Tinnitus of both ears 09/10/2016  . Ventral hernia without obstruction or gangrene 03/28/2016  . AAA (abdominal aortic aneurysm) without rupture ([place]) 07/20/2015  . Obstructive sleep apnea 03/17/2014  . Erectile dysfunction due to arterial insufficiency 01/12/2014  . Thrombocytopenia ([place]) 12/30/2013  . Allergic rhinitis 10/01/2012  . BPH (benign prostatic hyperplasia) 10/01/2012  . PVC's (premature ventricular contractions) 09/01/2009  . COLONIC POLYPS 04/14/2009  . Hyperlipidemia LDL goal <70 04/14/2009  . Essential hypertension 04/14/2009  . Coronary atherosclerosis 04/14/2009  . HIATAL HERNIA 04/14/2009  . HERNIATED DISC 04/14/2009    Standley Brooking, PTA 06/15/2019, 10:50 AM  [facility] [address] [place], [place], [address] Phone: [phone]   Fax:  [phone]  Name: [name] [name] MRN: [id number] Date of Birth: April 29, 1940

## 2019-06-15 NOTE — Chronic Care Management (AMB) (Signed)
  Chronic Care Management   Outreach Note  06/15/2019 Name: [name] [name] MRN: [id number] DOB: April 02, 1940  [name] is a 79 y.o. year old male who is a primary care patient of [name], DO. I reached out to [place] by phone today in response to a referral sent by [name]'s health plan.     An unsuccessful telephone outreach was attempted today. The patient was referred to the case management team for assistance with care management and care coordination.   Follow Up Plan: A HIPPA compliant phone message was left for the patient providing contact information and requesting a return call.  The care management team will reach out to the patient again over the next 7 days. If patient returns call to provider office, please advise to call [place] at [phone].  [place], [place] [address] Direct Dial: [phone] [email or web address]

## 2019-06-17 ENCOUNTER — Ambulatory Visit: Payer: Medicare Other | Admitting: *Deleted

## 2019-06-17 ENCOUNTER — Other Ambulatory Visit: Payer: Self-pay

## 2019-06-17 DIAGNOSIS — M25662 Stiffness of left knee, not elsewhere classified: Secondary | ICD-10-CM | POA: Diagnosis not present

## 2019-06-17 DIAGNOSIS — R600 Localized edema: Secondary | ICD-10-CM

## 2019-06-17 DIAGNOSIS — M25562 Pain in left knee: Secondary | ICD-10-CM

## 2019-06-17 DIAGNOSIS — R262 Difficulty in walking, not elsewhere classified: Secondary | ICD-10-CM

## 2019-06-17 NOTE — Therapy (Signed)
[facility] [place], [place], [address] Phone: [phone]   Fax:  [phone]  Physical Therapy Treatment  Patient Details  Name: [name] [name] MRN: [id number] Date of Birth: 06/23/1940 Referring Provider (PT): Dr. Gaynelle Arabian   Encounter Date: 06/17/2019  PT End of Session - 06/17/19 1056    Visit Number  5    Number of Visits  17    Date for PT Re-Evaluation  08/03/19    PT Start Time  1030    PT Stop Time  1132    PT Time Calculation (min)  62 min       Past Medical History:  Diagnosis Date  . AAA (abdominal aortic aneurysm) ([place])    gets abd [place] every 3 years to watch,last check 08/2018  . Arthritis   . BPH (benign prostatic hyperplasia)   . CAD (coronary artery disease)   . Colon polyps   . Dyslipidemia   . GERD (gastroesophageal reflux disease)   . Heart murmur   . Herniated disc   . Hiatal hernia   . History of kidney stones   . HTN (hypertension)   . Hyperlipidemia   . OSA on CPAP   . Tinnitus     Past Surgical History:  Procedure Laterality Date  . COLONOSCOPY    . CORONARY ARTERY BYPASS GRAFT  2006   LIMA to LAD, SVG to RCA, SVG to circumflex, SVG to diagonal   . mole removed  2003   stomach  . right knee surgery  2008  . TONSILLECTOMY    . TOTAL KNEE ARTHROPLASTY Right 04/27/2018   Procedure: TOTAL KNEE ARTHROPLASTY RIGHT;  Surgeon: Gaynelle Arabian, MD;  Location: [facility];  Service: Orthopedics;  Laterality: Right;  41min  . TOTAL KNEE ARTHROPLASTY Left 05/31/2019   Procedure: TOTAL KNEE ARTHROPLASTY;  Surgeon: Gaynelle Arabian, MD;  Location: [facility];  Service: Orthopedics;  Laterality: Left;  71min  . UPPER GI ENDOSCOPY      There were no vitals filed for this visit.  Subjective Assessment - 06/17/19 1057    Subjective  COVID-19 screen performed prior to patient entering clinic. LT knee is stiff and sore    Pertinent History  s/p L TKA on 05/31/2019, R TKA 04/27/2018    Patient Stated  Goals  Walk without device with no pain.    Currently in Pain?  No/denies    Pain Score  3     Pain Location  Knee    Pain Orientation  Left    Pain Descriptors / Indicators  Sore;Tightness    Pain Onset  1 to 4 weeks ago                       [facility] Adult PT Treatment/Exercise - 06/17/19 0001      Exercises   Exercises  Knee/Hip      Knee/Hip Exercises: Aerobic   Nustep  L3, seat 10-8 x15 min for ROM progression      Knee/Hip Exercises: Standing   Forward Lunges  Left;5 seconds;10 reps   hold 10 secs   Rocker Board  5 minutes      Knee/Hip Exercises: Supine   Short Arc Quad Sets  --   x 10 mins with [place]     Modalities   Psychiatric nurse  Russian stim  x 10 mins 10 secs on /off    Electrical Stimulation Goals  Neuromuscular facilitation      Vasopneumatic   Number Minutes Vasopneumatic   10 minutes    Vasopnuematic Location   Knee    Vasopneumatic Pressure  Medium    Vasopneumatic Temperature   34      Manual Therapy   Manual Therapy  Passive ROM    Passive ROM  In supine:  PROM into left knee flexion and extension                  PT Long Term Goals - 06/03/19 1103      PT LONG TERM GOAL #1   Title  Patient will be independent with advanced HEP    Time  6    Period  Weeks    Status  New      PT LONG TERM GOAL #2   Title  Patient will demonstrate 120+ degrees of right knee flexion AROM to improve ability to perform functional tasks.     Time  6    Period  Weeks    Status  New      PT LONG TERM GOAL #3   Title  Patient will demonstrate 0 degrees of right knee extension AROM to improve gait mechanics.     Time  6    Period  Weeks    Status  New      PT LONG TERM GOAL #4   Title  Patient will negotiate steps with a reciprocating gait pattern with one rail to safely access bedroom.     Time   6    Period  Weeks    Status  New      PT LONG TERM GOAL #5   Title  Patient will ambulate community distances without an AD and right knee pain less than 3/10.    Time  6    Period  Weeks    Status  New            Plan - 06/17/19 1114    Clinical Impression Statement  Pt arrived today doing fairly well with LT knee. Rx focused on ROM and quad activation.Rt knee flexion to 106 degrees and extension to-5 degrees. Russian stim used for [employer] facillitation with SAQs with good muscle activation. Normal modality response today.    Comorbidities  BPH, CAD, GERD,  heart murmur, R TKA 04/27/2018, herinated disc HTN, colon polyps, CABG 2006    Examination-Activity Limitations  Squat;Stairs;Stand;Transfers;Sit    Examination-Participation Restrictions  Driving;Community Activity;Yard Work;Other    Stability/Clinical Decision Making  Stable/Uncomplicated    Rehab Potential  Good    PT Frequency  2x / week    PT Duration  8 weeks    PT Treatment/Interventions  ADLs/Self Care Home Management;Cryotherapy;Electrical Stimulation;Ultrasound;Gait training;Stair training;Functional mobility training;Therapeutic activities;Therapeutic exercise;Balance training;Neuromuscular re-education;Patient/family education;Manual techniques;Passive range of motion;Taping    PT Next Visit Plan  Bike, TKA protocol, Vaso and other modalities as needed. Gait training, standing exercises    Consulted and Agree with Plan of Care  Patient       Patient will benefit from skilled therapeutic intervention in order to improve the following deficits and impairments:  Pain, Increased edema, Decreased mobility, Difficulty walking, Decreased range of motion, Decreased activity tolerance, Decreased strength, Impaired flexibility  Visit Diagnosis: Stiffness of left knee, not elsewhere classified  Acute pain of left knee  Difficulty in walking, not elsewhere classified  Localized edema  Problem List Patient Active  Problem List   Diagnosis Date Noted  . Osteoarthritis, knee 04/28/2018  . OA (osteoarthritis) of knee 04/27/2018  . Osteoarthritis of knee 05/08/2017  . Tinnitus of both ears 09/10/2016  . Ventral hernia without obstruction or gangrene 03/28/2016  . AAA (abdominal aortic aneurysm) without rupture ([place]) 07/20/2015  . Obstructive sleep apnea 03/17/2014  . Erectile dysfunction due to arterial insufficiency 01/12/2014  . Thrombocytopenia ([place]) 12/30/2013  . Allergic rhinitis 10/01/2012  . BPH (benign prostatic hyperplasia) 10/01/2012  . PVC's (premature ventricular contractions) 09/01/2009  . COLONIC POLYPS 04/14/2009  . Hyperlipidemia LDL goal <70 04/14/2009  . Essential hypertension 04/14/2009  . Coronary atherosclerosis 04/14/2009  . HIATAL HERNIA 04/14/2009  . HERNIATED DISC 04/14/2009    Odysseus Cada,CHRIS, PTA 06/17/2019, 1:16 PM  [facility] [place], [place], [address] Phone: [phone]   Fax:  [phone]  Name: Unnamed Dazey MRN: [id number] Date of Birth: 02/16/1941

## 2019-06-21 ENCOUNTER — Telehealth (INDEPENDENT_AMBULATORY_CARE_PROVIDER_SITE_OTHER): Payer: Medicare Other | Admitting: Family Medicine

## 2019-06-21 DIAGNOSIS — I2510 Atherosclerotic heart disease of native coronary artery without angina pectoris: Secondary | ICD-10-CM | POA: Diagnosis not present

## 2019-06-21 DIAGNOSIS — Z9889 Other specified postprocedural states: Secondary | ICD-10-CM

## 2019-06-21 DIAGNOSIS — W57XXXA Bitten or stung by nonvenomous insect and other nonvenomous arthropods, initial encounter: Secondary | ICD-10-CM

## 2019-06-21 MED ORDER — TRIAMCINOLONE ACETONIDE 0.1 % EX CREA: 1.0000 "application " | TOPICAL_CREAM | Freq: Two times a day (BID) | CUTANEOUS | 0 refills | Status: DC

## 2019-06-21 NOTE — Patient Instructions (Signed)

## 2019-06-21 NOTE — Progress Notes (Signed)
Telephone visit  Subjective: CC: tick bite PCP: [name], DO [id number] [name] [name] is a 79 y.o. male calls for telephone consult today. Patient provides verbal consent for consult held via phone.  Due to COVID-19 pandemic this visit was conducted virtually. This visit type was conducted due to national recommendations for restrictions regarding the COVID-19 Pandemic (e.g. social distancing, sheltering in place) in an effort to limit this patient's exposure and mitigate transmission in our community. All issues noted in this document were discussed and addressed.  A physical exam was not performed with this format.   Location of patient: home Location of provider: [facility] Others present for call: wife, Constance Holster  1.  Tick bite Patient reports he sustained a tick bite within the last week.  He denies any target lesions, arthralgia or myalgia (outside of his recent knee surgery).  He does report some fatigue but notes that this started before the tick bite.  He is on many sedating medications for the knee pain.  No measured fevers.  Does not report any headache or abdominal pain.  No treatments thus far to the tick bite.  He goes on to state that he has had some edema in the left lower extremity.  He is wearing his compression hose but gets quite tight at times and this causes increased pain.  He is currently waiting on his orthopedist to call him back for advice.   ROS: Per HPI  Allergies  Allergen Reactions  . Levofloxacin     Caused C- Diff  . Crestor [Rosuvastatin] Other (See Comments)    Liver problems  . Vytorin [Ezetimibe-Simvastatin] Other (See Comments)    Liver problems   Past Medical History:  Diagnosis Date  . AAA (abdominal aortic aneurysm) ([place])    gets abd [place] every 3 years to watch,last check 08/2018  . Arthritis   . BPH (benign prostatic hyperplasia)   . CAD (coronary artery disease)   . Colon polyps   . Dyslipidemia   . GERD (gastroesophageal reflux disease)    . Heart murmur   . Herniated disc   . Hiatal hernia   . History of kidney stones   . HTN (hypertension)   . Hyperlipidemia   . OSA on CPAP   . Tinnitus     Current Outpatient Medications:  .  acetaminophen (TYLENOL) 325 MG tablet, Take 650 mg by mouth every 6 (six) hours as needed (for pain.)., Disp: , Rfl:  .  Digestive Enzymes (ENZYME DIGEST PO), Take 1 capsule by mouth at bedtime., Disp: , Rfl:  .  Evolocumab (REPATHA SURECLICK) XX123456 MG/ML SOAJ, Inject 140 mLs into the skin every 14 (fourteen) days., Disp: 6 mL, Rfl: 1 .  fluticasone (FLONASE) 50 MCG/ACT nasal spray, Place 2 sprays into both nostrils daily as needed for allergies or rhinitis., Disp: , Rfl:  .  gabapentin (NEURONTIN) 100 MG capsule, Take 200 mg three times a day for two weeks following surgery.Then take 200 mg two times a day for two weeks. Then take 200 mg once a day for two weeks. Then discontinue., Disp: 168 capsule, Rfl: 0 .  methocarbamol (ROBAXIN) 500 MG tablet, Take 1 tablet (500 mg total) by mouth every 6 (six) hours as needed for muscle spasms., Disp: 40 tablet, Rfl: 0 .  metoprolol tartrate (LOPRESSOR) 25 MG tablet, Take 1 tablet (25 mg total) by mouth 2 (two) times daily., Disp: 180 tablet, Rfl: 1 .  nitroGLYCERIN (NITROSTAT) 0.4 MG SL tablet, Place 1 tablet (0.4  mg total) under the tongue every 5 (five) minutes as needed for chest pain., Disp: 25 tablet, Rfl: prn .  omeprazole (PRILOSEC) 20 MG capsule, Take 20 mg by mouth daily., Disp: , Rfl:  .  oxyCODONE (OXY IR/ROXICODONE) 5 MG immediate release tablet, Take 1-2 tablets (5-10 mg total) by mouth every 6 (six) hours as needed for severe pain., Disp: 56 tablet, Rfl: 0 .  Probiotic Product (PROBIOTIC PO), Take 1 capsule by mouth daily., Disp: , Rfl:  .  rivaroxaban (XARELTO) 10 MG TABS tablet, Take 1 tablet (10 mg total) by mouth daily with breakfast for 20 days. Then resume one 81 mg aspirin once a day., Disp: 20 tablet, Rfl: 0 .  traMADol (ULTRAM) 50 MG tablet,  Take 1-2 tablets (50-100 mg total) by mouth every 6 (six) hours as needed for moderate pain., Disp: 40 tablet, Rfl: 0  Assessment/ Plan: 79 y.o. male   1. Tick bite, initial encounter Sounds like a localized site reaction to a bug bite.  Nothing to suggest Lyme infection at this point.  I am going to empirically treat him with a little bit of triamcinolone cream apply to the affected area twice daily for the next 7 to 10 days.  We discussed red flag signs and symptoms warranting further evaluation and treatment with doxycycline.  He voiced good understanding of follow-up as needed - triamcinolone cream (KENALOG) 0.1 %; Apply 1 application topically 2 (two) times daily. x7-10 days  Dispense: 30 g; Refill: 0  2. S/P left knee surgery Having some significant swelling in the lower extremity.  His compression hose exacerbates the pain.  He is currently waiting on a call back from his orthopedist.  May benefit from a looser compression hose but we discussed that compression is likely needs to keep fluid off of the leg.  Encouraged elevation, continued ice and oral therapies as prescribed.   Start time: 12:34pm End time: 12:49pm  Total time spent on patient care (including telephone call/ virtual visit): 20 minutes  [place], [place] [phone]

## 2019-06-22 ENCOUNTER — Other Ambulatory Visit: Payer: Self-pay

## 2019-06-22 ENCOUNTER — Ambulatory Visit: Payer: Medicare Other | Admitting: Physical Therapy

## 2019-06-22 DIAGNOSIS — M25662 Stiffness of left knee, not elsewhere classified: Secondary | ICD-10-CM | POA: Diagnosis not present

## 2019-06-22 DIAGNOSIS — M25562 Pain in left knee: Secondary | ICD-10-CM

## 2019-06-22 DIAGNOSIS — R262 Difficulty in walking, not elsewhere classified: Secondary | ICD-10-CM

## 2019-06-22 NOTE — Therapy (Addendum)
[facility] [place], [place], [address] Phone: [phone]   Fax:  [phone]  Physical Therapy Treatment  Patient Details  Name: [name] [name] MRN: [id number] Date of Birth: March 17, 1940 Referring Provider (PT): Dr. Gaynelle Arabian   Encounter Date: 06/22/2019  PT End of Session - 06/22/19 1155    Visit Number  6    Number of Visits  17    Date for PT Re-Evaluation  08/03/19    PT Start Time  1039    PT Stop Time  1126    PT Time Calculation (min)  47 min    Activity Tolerance  Patient tolerated treatment well    Behavior During Therapy  [facility] for tasks assessed/performed       Past Medical History:  Diagnosis Date  . AAA (abdominal aortic aneurysm) ([place])    gets abd [place] every 3 years to watch,last check 08/2018  . Arthritis   . BPH (benign prostatic hyperplasia)   . CAD (coronary artery disease)   . Colon polyps   . Dyslipidemia   . GERD (gastroesophageal reflux disease)   . Heart murmur   . Herniated disc   . Hiatal hernia   . History of kidney stones   . HTN (hypertension)   . Hyperlipidemia   . OSA on CPAP   . Tinnitus     Past Surgical History:  Procedure Laterality Date  . COLONOSCOPY    . CORONARY ARTERY BYPASS GRAFT  2006   LIMA to LAD, SVG to RCA, SVG to circumflex, SVG to diagonal   . mole removed  2003   stomach  . right knee surgery  2008  . TONSILLECTOMY    . TOTAL KNEE ARTHROPLASTY Right 04/27/2018   Procedure: TOTAL KNEE ARTHROPLASTY RIGHT;  Surgeon: Gaynelle Arabian, MD;  Location: [facility];  Service: Orthopedics;  Laterality: Right;  11min  . TOTAL KNEE ARTHROPLASTY Left 05/31/2019   Procedure: TOTAL KNEE ARTHROPLASTY;  Surgeon: Gaynelle Arabian, MD;  Location: [facility];  Service: Orthopedics;  Laterality: Left;  42min  . UPPER GI ENDOSCOPY      There were no vitals filed for this visit.  Subjective Assessment - 06/22/19 1111    Subjective  COVID-19 screen performed prior to patient  entering clinic.  Knee feeling better.    Pertinent History  s/p L TKA on 05/31/2019, R TKA 04/27/2018    Patient Stated Goals  Walk without device with no pain.    Currently in Pain?  Yes    Pain Score  3     Pain Location  Knee    Pain Orientation  Left    Pain Descriptors / Indicators  Sore    Pain Type  Surgical pain    Pain Onset  1 to 4 weeks ago                       [facility] Adult PT Treatment/Exercise - 06/22/19 0001      Exercises   Exercises  Knee/Hip      Knee/Hip Exercises: Aerobic   Recumbent Bike  15 minutes beginning at seat 11 and progressing to seat 9.      Knee/Hip Exercises: Machines for Strengthening   Cybex Knee Extension  10# x 4 minutes.    Cybex Knee Flexion  30# x 4 minutes.      Modalities   Modalities  Artist  Left knee.    Electrical Stimulation Action  IFC at 1-10 Hz x 15 minutes.    Electrical Stimulation Goals  Edema;Pain      Vasopneumatic   Number Minutes Vasopneumatic   15 minutes    Vasopnuematic Location   --   Left knee.   Vasopneumatic Pressure  Low                  PT Long Term Goals - 06/03/19 1103      PT LONG TERM GOAL #1   Title  Patient will be independent with advanced HEP    Time  6    Period  Weeks    Status  New      PT LONG TERM GOAL #2   Title  Patient will demonstrate 120+ degrees of right knee flexion AROM to improve ability to perform functional tasks.     Time  6    Period  Weeks    Status  New      PT LONG TERM GOAL #3   Title  Patient will demonstrate 0 degrees of right knee extension AROM to improve gait mechanics.     Time  6    Period  Weeks    Status  New      PT LONG TERM GOAL #4   Title  Patient will negotiate steps with a reciprocating gait pattern with one rail to safely access bedroom.     Time  6    Period  Weeks    Status  New      PT LONG TERM GOAL #5   Title  Patient  will ambulate community distances without an AD and right knee pain less than 3/10.    Time  6    Period  Weeks    Status  New            Plan - 06/22/19 1146    Clinical Impression Statement  Patient did great with the addition of bike and weight machines.  Patient tolerated treatment without complaint.    Personal Factors and Comorbidities  Comorbidity 3+    Comorbidities  BPH, CAD, GERD,  heart murmur, R TKA 04/27/2018, herinated disc HTN, colon polyps, CABG 2006    Examination-Activity Limitations  Squat;Stairs;Stand;Transfers;Sit    Stability/Clinical Decision Making  Stable/Uncomplicated    Rehab Potential  Good    PT Frequency  2x / week    PT Duration  8 weeks    PT Treatment/Interventions  ADLs/Self Care Home Management;Cryotherapy;Electrical Stimulation;Ultrasound;Gait training;Stair training;Functional mobility training;Therapeutic activities;Therapeutic exercise;Balance training;Neuromuscular re-education;Patient/family education;Manual techniques;Passive range of motion;Taping    PT Next Visit Plan  Bike, TKA protocol, Vaso and other modalities as needed. Gait training, standing exercises    PT Home Exercise Plan  see pt insructions    Consulted and Agree with Plan of Care  Patient       Patient will benefit from skilled therapeutic intervention in order to improve the following deficits and impairments:  Pain, Increased edema, Decreased mobility, Difficulty walking, Decreased range of motion, Decreased activity tolerance, Decreased strength, Impaired flexibility  Visit Diagnosis: Stiffness of left knee, not elsewhere classified  Acute pain of left knee  Difficulty in walking, not elsewhere classified     Problem List Patient Active Problem List   Diagnosis Date Noted  . Osteoarthritis, knee 04/28/2018  . OA (osteoarthritis) of knee 04/27/2018  . Osteoarthritis of knee 05/08/2017  . Tinnitus of both ears 09/10/2016  . Ventral hernia without  obstruction or  gangrene 03/28/2016  . AAA (abdominal aortic aneurysm) without rupture ([place]) 07/20/2015  . Obstructive sleep apnea 03/17/2014  . Erectile dysfunction due to arterial insufficiency 01/12/2014  . Thrombocytopenia ([place]) 12/30/2013  . Allergic rhinitis 10/01/2012  . BPH (benign prostatic hyperplasia) 10/01/2012  . PVC's (premature ventricular contractions) 09/01/2009  . COLONIC POLYPS 04/14/2009  . Hyperlipidemia LDL goal <70 04/14/2009  . Essential hypertension 04/14/2009  . Coronary atherosclerosis 04/14/2009  . HIATAL HERNIA 04/14/2009  . HERNIATED DISC 04/14/2009    Aanvi Voyles, [place] MPT 06/22/2019, 11:56 AM  [facility] [place], [place], [address] Phone: [phone]   Fax:  [phone]  Name: [name] [name] MRN: [id number] Date of Birth: 1940/11/08

## 2019-06-24 ENCOUNTER — Other Ambulatory Visit: Payer: Self-pay

## 2019-06-24 ENCOUNTER — Ambulatory Visit: Payer: Medicare Other | Admitting: *Deleted

## 2019-06-24 DIAGNOSIS — R600 Localized edema: Secondary | ICD-10-CM

## 2019-06-24 DIAGNOSIS — R262 Difficulty in walking, not elsewhere classified: Secondary | ICD-10-CM

## 2019-06-24 DIAGNOSIS — M25662 Stiffness of left knee, not elsewhere classified: Secondary | ICD-10-CM

## 2019-06-24 NOTE — Therapy (Signed)
[facility] [place], [place], [address] Phone: [phone]   Fax:  [phone]  Physical Therapy Treatment  Patient Details  Name: [name] [name] MRN: [id number] Date of Birth: 12/24/1940 Referring Provider (PT): Dr. Gaynelle Arabian   Encounter Date: 06/24/2019  PT End of Session - 06/24/19 1208    Visit Number  7    Number of Visits  17    Date for PT Re-Evaluation  08/03/19    PT Start Time  1115    PT Stop Time  1215    PT Time Calculation (min)  60 min       Past Medical History:  Diagnosis Date  . AAA (abdominal aortic aneurysm) ([place])    gets abd [place] every 3 years to watch,last check 08/2018  . Arthritis   . BPH (benign prostatic hyperplasia)   . CAD (coronary artery disease)   . Colon polyps   . Dyslipidemia   . GERD (gastroesophageal reflux disease)   . Heart murmur   . Herniated disc   . Hiatal hernia   . History of kidney stones   . HTN (hypertension)   . Hyperlipidemia   . OSA on CPAP   . Tinnitus     Past Surgical History:  Procedure Laterality Date  . COLONOSCOPY    . CORONARY ARTERY BYPASS GRAFT  2006   LIMA to LAD, SVG to RCA, SVG to circumflex, SVG to diagonal   . mole removed  2003   stomach  . right knee surgery  2008  . TONSILLECTOMY    . TOTAL KNEE ARTHROPLASTY Right 04/27/2018   Procedure: TOTAL KNEE ARTHROPLASTY RIGHT;  Surgeon: Gaynelle Arabian, MD;  Location: [facility];  Service: Orthopedics;  Laterality: Right;  91min  . TOTAL KNEE ARTHROPLASTY Left 05/31/2019   Procedure: TOTAL KNEE ARTHROPLASTY;  Surgeon: Gaynelle Arabian, MD;  Location: [facility];  Service: Orthopedics;  Laterality: Left;  12min  . UPPER GI ENDOSCOPY      There were no vitals filed for this visit.  Subjective Assessment - 06/24/19 1213    Subjective  COVID-19 screen performed prior to patient entering clinic.  Knee feeling very tight today    Pertinent History  s/p L TKA on 05/31/2019, R TKA 04/27/2018    Currently  in Pain?  Yes    Pain Score  3     Pain Location  Knee    Pain Orientation  Left    Pain Descriptors / Indicators  Sore;Tightness    Pain Type  Surgical pain    Pain Onset  1 to 4 weeks ago                       [facility] Adult PT Treatment/Exercise - 06/24/19 0001      Exercises   Exercises  Knee/Hip      Knee/Hip Exercises: Aerobic   Recumbent Bike  seat9 x 10 mins    Nustep  L3, seat 8 x12 min for ROM progression      Knee/Hip Exercises: Standing   Forward Lunges  --   hold 10 secs   Lateral Step Up  Left;2 sets;10 reps;Step Height: 6"    Forward Step Up  Left;2 sets;10 reps;Hand Hold: 2;Step Height: 6"    Rocker Board  3 minutes    SLS  SLS on LT LE      Modalities   Modalities  Electrical Stimulation;Vasopneumatic      Electrical Stimulation  Electrical Stimulation Location  Left knee.    Chartered certified accountant  IFC     Electrical Stimulation Parameters  1-10hz  x 15 mins    Electrical Stimulation Goals  Edema;Pain      Vasopneumatic   Number Minutes Vasopneumatic   15 minutes    Vasopnuematic Location   Knee    Vasopneumatic Pressure  Low    Vasopneumatic Temperature   34      Manual Therapy   Manual Therapy  Passive ROM    Passive ROM  In supine:  PROM into left knee flexion and extension with end-range holds                  PT Long Term Goals - 06/03/19 1103      PT LONG TERM GOAL #1   Title  Patient will be independent with advanced HEP    Time  6    Period  Weeks    Status  New      PT LONG TERM GOAL #2   Title  Patient will demonstrate 120+ degrees of right knee flexion AROM to improve ability to perform functional tasks.     Time  6    Period  Weeks    Status  New      PT LONG TERM GOAL #3   Title  Patient will demonstrate 0 degrees of right knee extension AROM to improve gait mechanics.     Time  6    Period  Weeks    Status  New      PT LONG TERM GOAL #4   Title  Patient will negotiate steps with a  reciprocating gait pattern with one rail to safely access bedroom.     Time  6    Period  Weeks    Status  New      PT LONG TERM GOAL #5   Title  Patient will ambulate community distances without an AD and right knee pain less than 3/10.    Time  6    Period  Weeks    Status  New            Plan - 06/24/19 1210    Clinical Impression Statement  Pt arrived today with c/o increased tightness LT knee. Pt did well with ROM and strengthening act.'s. PROM performed to LT knee with end-range measurements 5-110 degrees today. Normal modality response today    Personal Factors and Comorbidities  Comorbidity 3+    Comorbidities  BPH, CAD, GERD,  heart murmur, R TKA 04/27/2018, herinated disc HTN, colon polyps, CABG 2006    Examination-Activity Limitations  Squat;Stairs;Stand;Transfers;Sit    Examination-Participation Restrictions  Driving;Community Activity;Yard Work;Other    Stability/Clinical Decision Making  Stable/Uncomplicated    Rehab Potential  Good    PT Frequency  2x / week    PT Duration  8 weeks    PT Treatment/Interventions  ADLs/Self Care Home Management;Cryotherapy;Electrical Stimulation;Ultrasound;Gait training;Stair training;Functional mobility training;Therapeutic activities;Therapeutic exercise;Balance training;Neuromuscular re-education;Patient/family education;Manual techniques;Passive range of motion;Taping    PT Next Visit Plan  Bike, TKA protocol, Vaso and other modalities as needed. PROM flexion/ extension    Consulted and Agree with Plan of Care  Patient       Patient will benefit from skilled therapeutic intervention in order to improve the following deficits and impairments:  Pain, Increased edema, Decreased mobility, Difficulty walking, Decreased range of motion, Decreased activity tolerance, Decreased strength, Impaired flexibility  Visit Diagnosis: Stiffness of left knee, not  elsewhere classified  Difficulty in walking, not elsewhere classified  Localized  edema     Problem List Patient Active Problem List   Diagnosis Date Noted  . Osteoarthritis, knee 04/28/2018  . OA (osteoarthritis) of knee 04/27/2018  . Osteoarthritis of knee 05/08/2017  . Tinnitus of both ears 09/10/2016  . Ventral hernia without obstruction or gangrene 03/28/2016  . AAA (abdominal aortic aneurysm) without rupture ([place]) 07/20/2015  . Obstructive sleep apnea 03/17/2014  . Erectile dysfunction due to arterial insufficiency 01/12/2014  . Thrombocytopenia ([place]) 12/30/2013  . Allergic rhinitis 10/01/2012  . BPH (benign prostatic hyperplasia) 10/01/2012  . PVC's (premature ventricular contractions) 09/01/2009  . COLONIC POLYPS 04/14/2009  . Hyperlipidemia LDL goal <70 04/14/2009  . Essential hypertension 04/14/2009  . Coronary atherosclerosis 04/14/2009  . HIATAL HERNIA 04/14/2009  . HERNIATED DISC 04/14/2009    Rossana Molchan,CHRIS, PTA 06/24/2019, 12:15 PM  [facility] [place], [place], [address] Phone: [phone]   Fax:  [phone]  Name: [name] [name] MRN: [id number] Date of Birth: [birth year]

## 2019-06-29 ENCOUNTER — Other Ambulatory Visit: Payer: Self-pay

## 2019-06-29 ENCOUNTER — Ambulatory Visit: Payer: Medicare Other | Admitting: Physical Therapy

## 2019-06-29 DIAGNOSIS — M25662 Stiffness of left knee, not elsewhere classified: Secondary | ICD-10-CM | POA: Diagnosis not present

## 2019-06-29 DIAGNOSIS — R262 Difficulty in walking, not elsewhere classified: Secondary | ICD-10-CM

## 2019-06-29 DIAGNOSIS — R600 Localized edema: Secondary | ICD-10-CM

## 2019-06-29 NOTE — Therapy (Signed)
[facility] [place], [place], [address] Phone: [phone]   Fax:  [phone]  Physical Therapy Treatment  Patient Details  Name: [name] [name] MRN: [id number] Date of Birth: December 14, 1940 Referring Provider (PT): Dr. Gaynelle Arabian   Encounter Date: 06/29/2019  PT End of Session - 06/29/19 1142    Visit Number  8    Number of Visits  17    Date for PT Re-Evaluation  08/03/19    PT Start Time  [id number]    PT Stop Time  1129    PT Time Calculation (min)  52 min    Activity Tolerance  Patient tolerated treatment well    Behavior During Therapy  [facility] for tasks assessed/performed       Past Medical History:  Diagnosis Date  . AAA (abdominal aortic aneurysm) ([place])    gets abd [place] every 3 years to watch,last check 08/2018  . Arthritis   . BPH (benign prostatic hyperplasia)   . CAD (coronary artery disease)   . Colon polyps   . Dyslipidemia   . GERD (gastroesophageal reflux disease)   . Heart murmur   . Herniated disc   . Hiatal hernia   . History of kidney stones   . HTN (hypertension)   . Hyperlipidemia   . OSA on CPAP   . Tinnitus     Past Surgical History:  Procedure Laterality Date  . COLONOSCOPY    . CORONARY ARTERY BYPASS GRAFT  2006   LIMA to LAD, SVG to RCA, SVG to circumflex, SVG to diagonal   . mole removed  2003   stomach  . right knee surgery  2008  . TONSILLECTOMY    . TOTAL KNEE ARTHROPLASTY Right 04/27/2018   Procedure: TOTAL KNEE ARTHROPLASTY RIGHT;  Surgeon: Gaynelle Arabian, MD;  Location: [facility];  Service: Orthopedics;  Laterality: Right;  81min  . TOTAL KNEE ARTHROPLASTY Left 05/31/2019   Procedure: TOTAL KNEE ARTHROPLASTY;  Surgeon: Gaynelle Arabian, MD;  Location: [facility];  Service: Orthopedics;  Laterality: Left;  59min  . UPPER GI ENDOSCOPY      There were no vitals filed for this visit.  Subjective Assessment - 06/29/19 1137    Subjective  COVID-19 screen performed prior to patient  entering clinic.  Been working hard at home.  Wife pushes on my knee.    Pertinent History  s/p L TKA on 05/31/2019, R TKA 04/27/2018    Patient Stated Goals  Walk without device with no pain.    Currently in Pain?  Yes    Pain Score  3     Pain Location  Knee    Pain Orientation  Left    Pain Descriptors / Indicators  Sore;Tightness    Pain Type  Surgical pain    Pain Onset  More than a month ago         [facility] PT Assessment - 06/29/19 0001      AROM   Left Knee Flexion  115                   [facility] Adult PT Treatment/Exercise - 06/29/19 0001      Exercises   Exercises  Knee/Hip      Knee/Hip Exercises: Aerobic   Recumbent Bike  11 minutes progressing to seat 5.    Nustep  Level 4 x 5 minutes.      Knee/Hip Exercises: Supine   Other Supine Knee/Hip Exercises  5# SAQ's x 4  minutes.      Modalities   Modalities  Health visitor Stimulation Location  Left knee.    Electrical Stimulation Action  IFC    Electrical Stimulation Parameters  1-10 Hz x 15 minutes.    Electrical Stimulation Goals  Edema;Pain      Vasopneumatic   Number Minutes Vasopneumatic   15 minutes    Vasopnuematic Location   --   Left knee.   Vasopneumatic Pressure  Low      Manual Therapy   Manual Therapy  Passive ROM    Passive ROM  5 minutes PROM to patient's right knee into flexion and extension.                  PT Long Term Goals - 06/03/19 1103      PT LONG TERM GOAL #1   Title  Patient will be independent with advanced HEP    Time  6    Period  Weeks    Status  New      PT LONG TERM GOAL #2   Title  Patient will demonstrate 120+ degrees of right knee flexion AROM to improve ability to perform functional tasks.     Time  6    Period  Weeks    Status  New      PT LONG TERM GOAL #3   Title  Patient will demonstrate 0 degrees of right knee extension AROM to improve gait mechanics.     Time  6     Period  Weeks    Status  New      PT LONG TERM GOAL #4   Title  Patient will negotiate steps with a reciprocating gait pattern with one rail to safely access bedroom.     Time  6    Period  Weeks    Status  New      PT LONG TERM GOAL #5   Title  Patient will ambulate community distances without an AD and right knee pain less than 3/10.    Time  6    Period  Weeks    Status  New            Plan - 06/29/19 1142    Clinical Impression Statement  The patient did very well today progressing to seat 5 on the bike today and achieving active left knee flexion to 115 degrees and passive to 120 degrees.    Personal Factors and Comorbidities  Comorbidity 3+    Comorbidities  BPH, CAD, GERD,  heart murmur, R TKA 04/27/2018, herinated disc HTN, colon polyps, CABG 2006    Examination-Activity Limitations  Squat;Stairs;Stand;Transfers;Sit    Examination-Participation Restrictions  Driving;Community Activity;Yard Work;Other    Stability/Clinical Decision Making  Stable/Uncomplicated    Rehab Potential  Good    PT Frequency  2x / week    PT Duration  8 weeks    PT Treatment/Interventions  ADLs/Self Care Home Management;Cryotherapy;Electrical Stimulation;Ultrasound;Gait training;Stair training;Functional mobility training;Therapeutic activities;Therapeutic exercise;Balance training;Neuromuscular re-education;Patient/family education;Manual techniques;Passive range of motion;Taping    PT Next Visit Plan  Bike, TKA protocol, Vaso and other modalities as needed. PROM flexion/ extension    PT Home Exercise Plan  see pt insructions    Consulted and Agree with Plan of Care  Patient       Patient will benefit from skilled therapeutic intervention in order to improve the following deficits and impairments:  Pain, Increased edema, Decreased  mobility, Difficulty walking, Decreased range of motion, Decreased activity tolerance, Decreased strength, Impaired flexibility  Visit Diagnosis: Stiffness of left  knee, not elsewhere classified  Difficulty in walking, not elsewhere classified  Localized edema     Problem List Patient Active Problem List   Diagnosis Date Noted  . Osteoarthritis, knee 04/28/2018  . OA (osteoarthritis) of knee 04/27/2018  . Osteoarthritis of knee 05/08/2017  . Tinnitus of both ears 09/10/2016  . Ventral hernia without obstruction or gangrene 03/28/2016  . AAA (abdominal aortic aneurysm) without rupture ([place]) 07/20/2015  . Obstructive sleep apnea 03/17/2014  . Erectile dysfunction due to arterial insufficiency 01/12/2014  . Thrombocytopenia ([place]) 12/30/2013  . Allergic rhinitis 10/01/2012  . BPH (benign prostatic hyperplasia) 10/01/2012  . PVC's (premature ventricular contractions) 09/01/2009  . COLONIC POLYPS 04/14/2009  . Hyperlipidemia LDL goal <70 04/14/2009  . Essential hypertension 04/14/2009  . Coronary atherosclerosis 04/14/2009  . HIATAL HERNIA 04/14/2009  . HERNIATED DISC 04/14/2009    Tranell Wojtkiewicz, [place] MPT 06/29/2019, 11:45 AM  [facility] [place], [place], [address] Phone: [phone]   Fax:  [phone]  Name: [name] [name] MRN: [id number] Date of Birth: June 03, 1940

## 2019-07-01 ENCOUNTER — Ambulatory Visit: Payer: Medicare Other | Admitting: Physical Therapy

## 2019-07-01 ENCOUNTER — Encounter: Payer: Self-pay | Admitting: Physical Therapy

## 2019-07-01 ENCOUNTER — Other Ambulatory Visit: Payer: Self-pay

## 2019-07-01 DIAGNOSIS — M25662 Stiffness of left knee, not elsewhere classified: Secondary | ICD-10-CM | POA: Diagnosis not present

## 2019-07-01 DIAGNOSIS — M25561 Pain in right knee: Secondary | ICD-10-CM

## 2019-07-01 DIAGNOSIS — R262 Difficulty in walking, not elsewhere classified: Secondary | ICD-10-CM

## 2019-07-01 DIAGNOSIS — M25661 Stiffness of right knee, not elsewhere classified: Secondary | ICD-10-CM

## 2019-07-01 DIAGNOSIS — M25562 Pain in left knee: Secondary | ICD-10-CM

## 2019-07-01 DIAGNOSIS — R600 Localized edema: Secondary | ICD-10-CM

## 2019-07-01 NOTE — Therapy (Signed)
[facility] [place], [place], [address] Phone: [phone]   Fax:  [phone]  Physical Therapy Treatment  Patient Details  Name: [name] [name] MRN: [id number] Date of Birth: 1940-08-31 Referring Provider (PT): Dr. Gaynelle Arabian   Encounter Date: 07/01/2019  PT End of Session - 07/01/19 1215    Visit Number  9    Number of Visits  17    Date for PT Re-Evaluation  08/03/19    PT Start Time  1030    PT Stop Time  1120    PT Time Calculation (min)  50 min    Equipment Utilized During Treatment  Other (comment)    Activity Tolerance  Patient tolerated treatment well    Behavior During Therapy  [facility] for tasks assessed/performed       Past Medical History:  Diagnosis Date  . AAA (abdominal aortic aneurysm) ([place])    gets abd [place] every 3 years to watch,last check 08/2018  . Arthritis   . BPH (benign prostatic hyperplasia)   . CAD (coronary artery disease)   . Colon polyps   . Dyslipidemia   . GERD (gastroesophageal reflux disease)   . Heart murmur   . Herniated disc   . Hiatal hernia   . History of kidney stones   . HTN (hypertension)   . Hyperlipidemia   . OSA on CPAP   . Tinnitus     Past Surgical History:  Procedure Laterality Date  . COLONOSCOPY    . CORONARY ARTERY BYPASS GRAFT  2006   LIMA to LAD, SVG to RCA, SVG to circumflex, SVG to diagonal   . mole removed  2003   stomach  . right knee surgery  2008  . TONSILLECTOMY    . TOTAL KNEE ARTHROPLASTY Right 04/27/2018   Procedure: TOTAL KNEE ARTHROPLASTY RIGHT;  Surgeon: Gaynelle Arabian, MD;  Location: [facility];  Service: Orthopedics;  Laterality: Right;  71min  . TOTAL KNEE ARTHROPLASTY Left 05/31/2019   Procedure: TOTAL KNEE ARTHROPLASTY;  Surgeon: Gaynelle Arabian, MD;  Location: [facility];  Service: Orthopedics;  Laterality: Left;  106min  . UPPER GI ENDOSCOPY      There were no vitals filed for this visit.  Subjective Assessment - 07/01/19 1103    Subjective  COVID-19 screen performed prior to patient entering clinic.  Pt questioning when he can get back to golf.    Pertinent History  s/p L TKA on 05/31/2019, R TKA 04/27/2018    Patient Stated Goals  Walk without device with no pain.    Pain Score  3     Pain Location  Knee    Pain Orientation  Left    Pain Descriptors / Indicators  Tightness;Sore    Pain Onset  More than a month ago                       [facility] Adult PT Treatment/Exercise - 07/01/19 0001      Ambulation/Gait   Gait Comments  amb out side of clinic: sidewalks, up/down 4 steps using single UE support, uneven grass surfaces, up and down inclines and unevel sidewalk surfaces x 15 minutes.       Exercises   Exercises  Knee/Hip      Knee/Hip Exercises: Aerobic   Recumbent Bike  15 minutes moving up 4 levels while on the bike to increase range      Knee/Hip Exercises: Machines for Strengthening   Cybex Knee Extension  20# 2 x 15     Cybex Knee Flexion  30# 2 x 15       Modalities   Modalities  Electrical Stimulation;Vasopneumatic      Electrical Stimulation   Electrical Stimulation Location  Left knee.    Electrical Stimulation Action  IFC    Electrical Stimulation Parameters  1-10 Hz x 15 minutes    Electrical Stimulation Goals  Edema;Pain      Vasopneumatic   Number Minutes Vasopneumatic   15 minutes    Vasopnuematic Location   Knee    Vasopneumatic Pressure  Medium    Vasopneumatic Temperature   34      Manual Therapy   Manual Therapy  Passive ROM    Passive ROM  5 minutes PROM to patient's right knee into flexion and extension.                  PT Long Term Goals - 07/01/19 1222      PT LONG TERM GOAL #1   Title  Patient will be independent with advanced HEP    Time  6    Period  Weeks    Status  On-going      PT LONG TERM GOAL #2   Title  Patient will demonstrate 120+ degrees of right knee flexion AROM to improve ability to perform functional tasks.     Time  6     Period  Weeks    Status  On-going      PT LONG TERM GOAL #3   Title  Patient will demonstrate 0 degrees of right knee extension AROM to improve gait mechanics.     Period  Weeks    Status  On-going      PT LONG TERM GOAL #4   Title  Patient will negotiate steps with a reciprocating gait pattern with one rail to safely access bedroom.     Period  Weeks    Status  On-going      PT LONG TERM GOAL #5   Title  Patient will ambulate community distances without an AD and right knee pain less than 3/10.    Period  Weeks    Status  New            Plan - 07/01/19 1219    Clinical Impression Statement  Pt tolerating well making progress with L LE ROM to 115 actively and 120 passively. Pt still progressing with strength and gait pattern working to prevent hip circumduction and improve knee and hip flexion. Continue skilled PT.    Personal Factors and Comorbidities  Comorbidity 3+    Comorbidities  BPH, CAD, GERD,  heart murmur, R TKA 04/27/2018, herinated disc HTN, colon polyps, CABG 2006    Examination-Activity Limitations  Squat;Stairs;Stand;Transfers;Sit    Examination-Participation Restrictions  Driving;Community Activity;Yard Work;Other    Stability/Clinical Decision Making  Stable/Uncomplicated    Rehab Potential  Good    PT Frequency  2x / week    PT Treatment/Interventions  ADLs/Self Care Home Management;Cryotherapy;Electrical Stimulation;Ultrasound;Gait training;Stair training;Functional mobility training;Therapeutic activities;Therapeutic exercise;Balance training;Neuromuscular re-education;Patient/family education;Manual techniques;Passive range of motion;Taping    PT Next Visit Plan  Bike, TKA protocol, Vaso and other modalities as needed. PROM flexion/ extension    PT Home Exercise Plan  see pt insructions    Consulted and Agree with Plan of Care  Patient       Patient will benefit from skilled therapeutic intervention in order to improve the following deficits and  impairments:  Pain, Increased edema, Decreased mobility, Difficulty walking, Decreased range of motion, Decreased activity tolerance, Decreased strength, Impaired flexibility  Visit Diagnosis: Stiffness of left knee, not elsewhere classified  Difficulty in walking, not elsewhere classified  Localized edema  Acute pain of left knee  Acute pain of right knee  Stiffness of right knee, not elsewhere classified     Problem List Patient Active Problem List   Diagnosis Date Noted  . Osteoarthritis, knee 04/28/2018  . OA (osteoarthritis) of knee 04/27/2018  . Osteoarthritis of knee 05/08/2017  . Tinnitus of both ears 09/10/2016  . Ventral hernia without obstruction or gangrene 03/28/2016  . AAA (abdominal aortic aneurysm) without rupture ([place]) 07/20/2015  . Obstructive sleep apnea 03/17/2014  . Erectile dysfunction due to arterial insufficiency 01/12/2014  . Thrombocytopenia ([place]) 12/30/2013  . Allergic rhinitis 10/01/2012  . BPH (benign prostatic hyperplasia) 10/01/2012  . PVC's (premature ventricular contractions) 09/01/2009  . COLONIC POLYPS 04/14/2009  . Hyperlipidemia LDL goal <70 04/14/2009  . Essential hypertension 04/14/2009  . Coronary atherosclerosis 04/14/2009  . HIATAL HERNIA 04/14/2009  . HERNIATED DISC 04/14/2009    Oretha Caprice, MPT 07/01/2019, 12:24 PM  [facility] [address] [place], [place], [address] Phone: [phone]   Fax:  [phone]  Name: [name] [name] MRN: [id number] Date of Birth: 12/11/1940

## 2019-07-06 ENCOUNTER — Encounter: Payer: Self-pay | Admitting: Physical Therapy

## 2019-07-06 ENCOUNTER — Ambulatory Visit: Payer: Medicare Other | Attending: Student | Admitting: Physical Therapy

## 2019-07-06 ENCOUNTER — Other Ambulatory Visit: Payer: Self-pay

## 2019-07-06 DIAGNOSIS — R600 Localized edema: Secondary | ICD-10-CM | POA: Insufficient documentation

## 2019-07-06 DIAGNOSIS — M25662 Stiffness of left knee, not elsewhere classified: Secondary | ICD-10-CM | POA: Diagnosis not present

## 2019-07-06 DIAGNOSIS — R262 Difficulty in walking, not elsewhere classified: Secondary | ICD-10-CM | POA: Diagnosis present

## 2019-07-06 DIAGNOSIS — M25562 Pain in left knee: Secondary | ICD-10-CM | POA: Insufficient documentation

## 2019-07-06 NOTE — Therapy (Signed)
[facility] [place], [place], [address] Phone: [phone]   Fax:  [phone]  Physical Therapy Treatment  Patient Details  Name: [name] [name] MRN: [id number] Date of Birth: 1940-12-13 Referring Provider (PT): Dr. Gaynelle Arabian   Encounter Date: 07/06/2019  PT End of Session - 07/06/19 1002    Visit Number  10    Number of Visits  17    Date for PT Re-Evaluation  08/03/19    PT Start Time  0945    PT Stop Time  1030    PT Time Calculation (min)  45 min    Equipment Utilized During Treatment  Other (comment)    Activity Tolerance  Patient tolerated treatment well    Behavior During Therapy  [facility] for tasks assessed/performed       Past Medical History:  Diagnosis Date  . AAA (abdominal aortic aneurysm) ([place])    gets abd [place] every 3 years to watch,last check 08/2018  . Arthritis   . BPH (benign prostatic hyperplasia)   . CAD (coronary artery disease)   . Colon polyps   . Dyslipidemia   . GERD (gastroesophageal reflux disease)   . Heart murmur   . Herniated disc   . Hiatal hernia   . History of kidney stones   . HTN (hypertension)   . Hyperlipidemia   . OSA on CPAP   . Tinnitus     Past Surgical History:  Procedure Laterality Date  . COLONOSCOPY    . CORONARY ARTERY BYPASS GRAFT  2006   LIMA to LAD, SVG to RCA, SVG to circumflex, SVG to diagonal   . mole removed  2003   stomach  . right knee surgery  2008  . TONSILLECTOMY    . TOTAL KNEE ARTHROPLASTY Right 04/27/2018   Procedure: TOTAL KNEE ARTHROPLASTY RIGHT;  Surgeon: Gaynelle Arabian, MD;  [facility]: [facility];  Service: Orthopedics;  Laterality: Right;  77min  . TOTAL KNEE ARTHROPLASTY Left 05/31/2019   Procedure: TOTAL KNEE ARTHROPLASTY;  Surgeon: Gaynelle Arabian, MD;  Location: [facility];  Service: Orthopedics;  Laterality: Left;  49min  . UPPER GI ENDOSCOPY      There were no vitals filed for this visit.  Subjective Assessment - 07/06/19 1000     Subjective  COVID-19 screen performed prior to patient entering clinic.  Pt reporting that he stood in his yard and hit some golf balls with no pain reported.    Pertinent History  s/p L TKA on 05/31/2019, R TKA 04/27/2018    Patient Stated Goals  Walk without device with no pain.    Currently in Pain?  Yes    Pain Score  3     Pain Location  Knee    Pain Orientation  Left    Pain Type  Surgical pain    Pain Onset  More than a month ago    Pain Frequency  Intermittent         [facility] PT Assessment - 07/06/19 0001      Ambulation/Gait   Gait Comments  amb out side on uneven surfaces, minicing golf swing using wand                   [facility] Adult PT Treatment/Exercise - 07/06/19 0001      Exercises   Exercises  Knee/Hip      Knee/Hip Exercises: Stretches   Gastroc Stretch  Both;3 reps;30 seconds      Knee/Hip Exercises: Aerobic  Recumbent Bike  10 minutes full revolutions with seat at #4      Knee/Hip Exercises: Machines for Strengthening   Cybex Knee Extension  30# 2 x 10, LE only    Cybex Knee Flexion  40# 2 x 10, L LE only    Cybex Leg Press  2 plates L LE only       Modalities   Modalities  Electrical Stimulation;Vasopneumatic      Manual Therapy   Manual Therapy  Passive ROM    Manual therapy comments  5 minutes    Passive ROM  flexion /extension                  PT Long Term Goals - 07/06/19 1004      PT LONG TERM GOAL #1   Title  Patient will be independent with advanced HEP    Time  6    Period  Weeks    Status  On-going      PT LONG TERM GOAL #2   Title  Patient will demonstrate 120+ degrees of right knee flexion AROM to improve ability to perform functional tasks.     Time  6    Period  Weeks    Status  On-going      PT LONG TERM GOAL #3   Title  Patient will demonstrate 0 degrees of right knee extension AROM to improve gait mechanics.     Period  Weeks    Status  On-going      PT LONG TERM GOAL #4   Title  Patient will  negotiate steps with a reciprocating gait pattern with one rail to safely access bedroom.     Time  6    Period  Weeks    Status  On-going      PT LONG TERM GOAL #5   Title  Patient will ambulate community distances without an AD and right knee pain less than 3/10.    Time  6    Period  Weeks    Status  On-going            Plan - 07/06/19 1015    Clinical Impression Statement  Pt tolerating treatment well. Pt progressing to 120 degrees passively. Pt making progressing with strength and ROM, Progressing with more functional mobility and getting back to golf. Continue skilled PT.    Personal Factors and Comorbidities  Comorbidity 3+    Comorbidities  BPH, CAD, GERD,  heart murmur, R TKA 04/27/2018, herinated disc HTN, colon polyps, CABG 2006    Examination-Activity Limitations  Squat;Stairs;Stand;Transfers;Sit    Stability/Clinical Decision Making  Stable/Uncomplicated    Rehab Potential  Good    PT Frequency  2x / week    PT Treatment/Interventions  ADLs/Self Care Home Management;Cryotherapy;Electrical Stimulation;Ultrasound;Gait training;Stair training;Functional mobility training;Therapeutic activities;Therapeutic exercise;Balance training;Neuromuscular re-education;Patient/family education;Manual techniques;Passive range of motion;Taping    PT Next Visit Plan  Bike, TKA protocol, Vaso and other modalities as needed. PROM flexion/ extension    PT Home Exercise Plan  see pt insructions    Consulted and Agree with Plan of Care  Patient       Patient will benefit from skilled therapeutic intervention in order to improve the following deficits and impairments:  Pain, Increased edema, Decreased mobility, Difficulty walking, Decreased range of motion, Decreased activity tolerance, Decreased strength, Impaired flexibility  Visit Diagnosis: Stiffness of left knee, not elsewhere classified  Difficulty in walking, not elsewhere classified  Localized edema  Acute pain of  left  knee     Problem List Patient Active Problem List   Diagnosis Date Noted  . Osteoarthritis, knee 04/28/2018  . OA (osteoarthritis) of knee 04/27/2018  . Osteoarthritis of knee 05/08/2017  . Tinnitus of both ears 09/10/2016  . Ventral hernia without obstruction or gangrene 03/28/2016  . AAA (abdominal aortic aneurysm) without rupture ([place]) 07/20/2015  . Obstructive sleep apnea 03/17/2014  . Erectile dysfunction due to arterial insufficiency 01/12/2014  . Thrombocytopenia ([place]) 12/30/2013  . Allergic rhinitis 10/01/2012  . BPH (benign prostatic hyperplasia) 10/01/2012  . PVC's (premature ventricular contractions) 09/01/2009  . COLONIC POLYPS 04/14/2009  . Hyperlipidemia LDL goal <70 04/14/2009  . Essential hypertension 04/14/2009  . Coronary atherosclerosis 04/14/2009  . HIATAL HERNIA 04/14/2009  . HERNIATED DISC 04/14/2009    Oretha Caprice, PT, MPT 07/06/2019, 10:18 AM  [facility] [address] [place], [place], [address] Phone: [phone]   Fax:  [phone]  Name: [name] [name] MRN: [id number] Date of Birth: 02-01-1941

## 2019-07-08 ENCOUNTER — Other Ambulatory Visit: Payer: Self-pay

## 2019-07-08 ENCOUNTER — Ambulatory Visit: Payer: Medicare Other | Admitting: Physical Therapy

## 2019-07-08 ENCOUNTER — Encounter: Payer: Self-pay | Admitting: Physical Therapy

## 2019-07-08 DIAGNOSIS — M25562 Pain in left knee: Secondary | ICD-10-CM

## 2019-07-08 DIAGNOSIS — M25662 Stiffness of left knee, not elsewhere classified: Secondary | ICD-10-CM | POA: Diagnosis not present

## 2019-07-08 DIAGNOSIS — R262 Difficulty in walking, not elsewhere classified: Secondary | ICD-10-CM

## 2019-07-08 DIAGNOSIS — R600 Localized edema: Secondary | ICD-10-CM

## 2019-07-08 NOTE — Therapy (Signed)
[facility] [place], [place], [address] Phone: [phone]   Fax:  [phone]  Physical Therapy Treatment  Patient Details  Name: [name] [name] MRN: [id number] Date of Birth: [birth year] Referring Provider (PT): Dr. Gaynelle Arabian   Encounter Date: 07/08/2019  PT End of Session - 07/08/19 1428    Visit Number  11    Number of Visits  17    Date for PT Re-Evaluation  08/03/19    PT Start Time  1350    PT Stop Time  1432    PT Time Calculation (min)  42 min    Activity Tolerance  Patient tolerated treatment well    Behavior During Therapy  [facility] for tasks assessed/performed       Past Medical History:  Diagnosis Date  . AAA (abdominal aortic aneurysm) ([place])    gets abd [place] every 3 years to watch,last check 08/2018  . Arthritis   . BPH (benign prostatic hyperplasia)   . CAD (coronary artery disease)   . Colon polyps   . Dyslipidemia   . GERD (gastroesophageal reflux disease)   . Heart murmur   . Herniated disc   . Hiatal hernia   . History of kidney stones   . HTN (hypertension)   . Hyperlipidemia   . OSA on CPAP   . Tinnitus     Past Surgical History:  Procedure Laterality Date  . COLONOSCOPY    . CORONARY ARTERY BYPASS GRAFT  2006   LIMA to LAD, SVG to RCA, SVG to circumflex, SVG to diagonal   . mole removed  2003   stomach  . right knee surgery  2008  . TONSILLECTOMY    . TOTAL KNEE ARTHROPLASTY Right 04/27/2018   Procedure: TOTAL KNEE ARTHROPLASTY RIGHT;  Surgeon: Gaynelle Arabian, MD;  [facility]: [facility];  Service: Orthopedics;  Laterality: Right;  80min  . TOTAL KNEE ARTHROPLASTY Left 05/31/2019   Procedure: TOTAL KNEE ARTHROPLASTY;  Surgeon: Gaynelle Arabian, MD;  [facility]: [facility];  Service: Orthopedics;  Laterality: Left;  87min  . UPPER GI ENDOSCOPY      There were no vitals filed for this visit.  Subjective Assessment - 07/08/19 1351    Subjective  COVID-19 screen performed prior to patient  entering clinic. Reports that Dr. Maureen Ralphs was pleased at last visit.    Pertinent History  s/p L TKA on 05/31/2019, R TKA 04/27/2018    Patient Stated Goals  Walk without device with no pain.    Currently in Pain?  Yes    Pain Score  2     Pain Location  Knee    Pain Orientation  Left    Pain Descriptors / Indicators  Discomfort    Pain Type  Surgical pain    Pain Onset  More than a month ago    Pain Frequency  Intermittent         [facility] PT Assessment - 07/08/19 0001      Assessment   Medical Diagnosis  L TKA, 05/31/2019    Referring Provider (PT)  Dr. Gaynelle Arabian    Onset Date/Surgical Date  05/31/19    Hand Dominance  Right    Next MD Visit  08/10/2019    Prior Therapy  yes following R TKA      Precautions   Precautions  None      Restrictions   Weight Bearing Restrictions  No      ROM / Strength   AROM /  PROM / Strength  AROM      AROM   Overall AROM   Within functional limits for tasks performed    AROM Assessment Site  Knee    Right/Left Knee  Left    Left Knee Flexion  123                   [facility] Adult PT Treatment/Exercise - 07/08/19 0001      Knee/Hip Exercises: Aerobic   Recumbent Bike  L3, seat 6-4 x12 min      Knee/Hip Exercises: Machines for Strengthening   Cybex Knee Extension  20# 3x10 reps    Cybex Knee Flexion  30# 3x10 reps; LLE eccentric control      Knee/Hip Exercises: Standing   Forward Lunges  Left;10 reps;5 seconds    Step Down  Left;20 reps;Hand Hold: 2;Step Height: 4"   heel dot for eccentric control   Rocker Board  3 minutes      Modalities   Modalities  Vasopneumatic      Vasopneumatic   Number Minutes Vasopneumatic   10 minutes    Vasopnuematic Location   Knee    Vasopneumatic Pressure  Medium    Vasopneumatic Temperature   34                  PT Long Term Goals - 07/06/19 1004      PT LONG TERM GOAL #1   Title  Patient will be independent with advanced HEP    Time  6    Period  Weeks    Status   On-going      PT LONG TERM GOAL #2   Title  Patient will demonstrate 120+ degrees of right knee flexion AROM to improve ability to perform functional tasks.     Time  6    Period  Weeks    Status  On-going      PT LONG TERM GOAL #3   Title  Patient will demonstrate 0 degrees of right knee extension AROM to improve gait mechanics.     Period  Weeks    Status  On-going      PT LONG TERM GOAL #4   Title  Patient will negotiate steps with a reciprocating gait pattern with one rail to safely access bedroom.     Time  6    Period  Weeks    Status  On-going      PT LONG TERM GOAL #5   Title  Patient will ambulate community distances without an AD and right knee pain less than 3/10.    Time  6    Period  Weeks    Status  On-going            Plan - 07/08/19 1429    Clinical Impression Statement  Patient presented in clinic with reports of really working hard on HEP to return to active lifestyle. Patient remains diligent with exercises at home as well and may attempt some golf activities next week per patient report. Patient guided through less strenuous machine strengthening today due to more discomfort last week. Continued eccentric focus as patient reports descending stairs is more difficult for him. AROM of L knee flexion measured as 123 deg today. Normal vasopneumatic response noted following removal of the modality.    Personal Factors and Comorbidities  Comorbidity 3+    Comorbidities  BPH, CAD, GERD,  heart murmur, R TKA 04/27/2018, herinated disc HTN, colon polyps, CABG 2006  Examination-Activity Limitations  Squat;Stairs;Stand;Transfers;Sit    Examination-Participation Restrictions  Driving;Community Activity;Yard Work;Other    Stability/Clinical Decision Making  Stable/Uncomplicated    Rehab Potential  Good    PT Frequency  2x / week    PT Duration  8 weeks    PT Treatment/Interventions  ADLs/Self Care Home Management;Cryotherapy;Electrical Stimulation;Ultrasound;Gait  training;Stair training;Functional mobility training;Therapeutic activities;Therapeutic exercise;Balance training;Neuromuscular re-education;Patient/family education;Manual techniques;Passive range of motion;Taping    PT Next Visit Plan  Continue progressing ROM, eccentric strength for stairs.    PT Home Exercise Plan  see pt insructions    Consulted and Agree with Plan of Care  Patient       Patient will benefit from skilled therapeutic intervention in order to improve the following deficits and impairments:  Pain, Increased edema, Decreased mobility, Difficulty walking, Decreased range of motion, Decreased activity tolerance, Decreased strength, Impaired flexibility  Visit Diagnosis: Stiffness of left knee, not elsewhere classified  Difficulty in walking, not elsewhere classified  Localized edema  Acute pain of left knee     Problem List Patient Active Problem List   Diagnosis Date Noted  . Osteoarthritis, knee 04/28/2018  . OA (osteoarthritis) of knee 04/27/2018  . Osteoarthritis of knee 05/08/2017  . Tinnitus of both ears 09/10/2016  . Ventral hernia without obstruction or gangrene 03/28/2016  . AAA (abdominal aortic aneurysm) without rupture ([place]) 07/20/2015  . Obstructive sleep apnea 03/17/2014  . Erectile dysfunction due to arterial insufficiency 01/12/2014  . Thrombocytopenia ([place]) 12/30/2013  . Allergic rhinitis 10/01/2012  . BPH (benign prostatic hyperplasia) 10/01/2012  . PVC's (premature ventricular contractions) 09/01/2009  . COLONIC POLYPS 04/14/2009  . Hyperlipidemia LDL goal <70 04/14/2009  . Essential hypertension 04/14/2009  . Coronary atherosclerosis 04/14/2009  . HIATAL HERNIA 04/14/2009  . HERNIATED DISC 04/14/2009    Standley Brooking, PTA 07/08/2019, 3:14 PM  [facility] [address] [place], [place], [address] Phone: [phone]   Fax:  [phone]  Name: Dmoni Causby MRN: [id number] Date of  Birth: 1940-05-01

## 2019-07-13 ENCOUNTER — Ambulatory Visit: Payer: Medicare Other | Admitting: Physical Therapy

## 2019-07-13 ENCOUNTER — Other Ambulatory Visit: Payer: Self-pay

## 2019-07-13 DIAGNOSIS — R262 Difficulty in walking, not elsewhere classified: Secondary | ICD-10-CM

## 2019-07-13 DIAGNOSIS — R600 Localized edema: Secondary | ICD-10-CM

## 2019-07-13 DIAGNOSIS — M25662 Stiffness of left knee, not elsewhere classified: Secondary | ICD-10-CM | POA: Diagnosis not present

## 2019-07-13 NOTE — Therapy (Signed)
[facility] [place], [place], [address] Phone: [phone]   Fax:  [phone]  Physical Therapy Treatment  Patient Details  Name: [name] [name] MRN: [id number] Date of Birth: 11/25/1940 Referring Provider (PT): Dr. Gaynelle Arabian   Encounter Date: 07/13/2019  PT End of Session - 07/13/19 1140    Visit Number  12    Number of Visits  17    Date for PT Re-Evaluation  08/03/19    PT Start Time  1039    PT Stop Time  1126    PT Time Calculation (min)  47 min    Activity Tolerance  Patient tolerated treatment well    Behavior During Therapy  [facility] for tasks assessed/performed       Past Medical History:  Diagnosis Date  . AAA (abdominal aortic aneurysm) ([place])    gets abd [place] every 3 years to watch,last check 08/2018  . Arthritis   . BPH (benign prostatic hyperplasia)   . CAD (coronary artery disease)   . Colon polyps   . Dyslipidemia   . GERD (gastroesophageal reflux disease)   . Heart murmur   . Herniated disc   . Hiatal hernia   . History of kidney stones   . HTN (hypertension)   . Hyperlipidemia   . OSA on CPAP   . Tinnitus     Past Surgical History:  Procedure Laterality Date  . COLONOSCOPY    . CORONARY ARTERY BYPASS GRAFT  2006   LIMA to LAD, SVG to RCA, SVG to circumflex, SVG to diagonal   . mole removed  2003   stomach  . right knee surgery  2008  . TONSILLECTOMY    . TOTAL KNEE ARTHROPLASTY Right 04/27/2018   Procedure: TOTAL KNEE ARTHROPLASTY RIGHT;  Surgeon: Gaynelle Arabian, MD;  Location: [facility];  Service: Orthopedics;  Laterality: Right;  12min  . TOTAL KNEE ARTHROPLASTY Left 05/31/2019   Procedure: TOTAL KNEE ARTHROPLASTY;  Surgeon: Gaynelle Arabian, MD;  Location: [facility];  Service: Orthopedics;  Laterality: Left;  52min  . UPPER GI ENDOSCOPY      There were no vitals filed for this visit.  Subjective Assessment - 07/13/19 1043    Subjective  COVID-19 screen performed prior to patient  entering clinic.  Dr. Wynelle Link was very pleased.    Pertinent History  s/p L TKA on 05/31/2019, R TKA 04/27/2018    Patient Stated Goals  Walk without device with no pain.         [facility] PT Assessment - 07/13/19 0001      AROM   Left Knee Flexion  127   and passive flexion to 132 degrees.                  [place] Adult PT Treatment/Exercise - 07/13/19 0001      Exercises   Exercises  Knee/Hip      Knee/Hip Exercises: Aerobic   Recumbent Bike  Level progressing to seat 4 x 15 minutes.      Knee/Hip Exercises: Machines for Strengthening   Cybex Knee Extension  20# x 2 minutes.    Cybex Knee Flexion  40# x 3 minutes.    Cybex Leg Press  3 plates x 3 minutes.      Vasopneumatic   Number Minutes Vasopneumatic   15 minutes    Vasopnuematic Location   --   Left knee.   Vasopneumatic Pressure  Medium  PT Long Term Goals - 07/06/19 1004      PT LONG TERM GOAL #1   Title  Patient will be independent with advanced HEP    Time  6    Period  Weeks    Status  On-going      PT LONG TERM GOAL #2   Title  Patient will demonstrate 120+ degrees of right knee flexion AROM to improve ability to perform functional tasks.     Time  6    Period  Weeks    Status  On-going      PT LONG TERM GOAL #3   Title  Patient will demonstrate 0 degrees of right knee extension AROM to improve gait mechanics.     Period  Weeks    Status  On-going      PT LONG TERM GOAL #4   Title  Patient will negotiate steps with a reciprocating gait pattern with one rail to safely access bedroom.     Time  6    Period  Weeks    Status  On-going      PT LONG TERM GOAL #5   Title  Patient will ambulate community distances without an AD and right knee pain less than 3/10.    Time  6    Period  Weeks    Status  On-going            Plan - 07/13/19 1108    Clinical Impression Statement  Excellent job.  Patient to seat one on bike.  Excellent technique during ther ex  performance.    Personal Factors and Comorbidities  Comorbidity 3+    Comorbidities  BPH, CAD, GERD,  heart murmur, R TKA 04/27/2018, herinated disc HTN, colon polyps, CABG 2006    Examination-Activity Limitations  Squat;Stairs;Stand;Transfers;Sit    Examination-Participation Restrictions  Driving;Community Activity;Yard Work;Other    Stability/Clinical Decision Making  Stable/Uncomplicated    Rehab Potential  Good    PT Frequency  2x / week    PT Duration  8 weeks    PT Treatment/Interventions  ADLs/Self Care Home Management;Cryotherapy;Electrical Stimulation;Ultrasound;Gait training;Stair training;Functional mobility training;Therapeutic activities;Therapeutic exercise;Balance training;Neuromuscular re-education;Patient/family education;Manual techniques;Passive range of motion;Taping    PT Next Visit Plan  Continue progressing ROM, eccentric strength for stairs.    PT Home Exercise Plan  see pt insructions    Consulted and Agree with Plan of Care  Patient       Patient will benefit from skilled therapeutic intervention in order to improve the following deficits and impairments:  Pain, Increased edema, Decreased mobility, Difficulty walking, Decreased range of motion, Decreased activity tolerance, Decreased strength, Impaired flexibility  Visit Diagnosis: Stiffness of left knee, not elsewhere classified  Difficulty in walking, not elsewhere classified  Localized edema     Problem List Patient Active Problem List   Diagnosis Date Noted  . Osteoarthritis, knee 04/28/2018  . OA (osteoarthritis) of knee 04/27/2018  . Osteoarthritis of knee 05/08/2017  . Tinnitus of both ears 09/10/2016  . Ventral hernia without obstruction or gangrene 03/28/2016  . AAA (abdominal aortic aneurysm) without rupture ([place]) 07/20/2015  . Obstructive sleep apnea 03/17/2014  . Erectile dysfunction due to arterial insufficiency 01/12/2014  . Thrombocytopenia ([place]) 12/30/2013  . Allergic rhinitis  10/01/2012  . BPH (benign prostatic hyperplasia) 10/01/2012  . PVC's (premature ventricular contractions) 09/01/2009  . COLONIC POLYPS 04/14/2009  . Hyperlipidemia LDL goal <70 04/14/2009  . Essential hypertension 04/14/2009  . Coronary atherosclerosis 04/14/2009  . HIATAL HERNIA 04/14/2009  .  HERNIATED DISC 04/14/2009    Nallely Yost, [place] MPT 07/13/2019, 11:42 AM  [facility] [place], [place], [address] Phone: [phone]   Fax:  [phone]  Name: Jacari Helmandollar MRN: [id number] Date of Birth: [birth year]

## 2019-07-15 ENCOUNTER — Ambulatory Visit: Payer: Medicare Other | Admitting: Physical Therapy

## 2019-07-20 ENCOUNTER — Other Ambulatory Visit: Payer: Self-pay

## 2019-07-20 ENCOUNTER — Ambulatory Visit: Payer: Medicare Other | Admitting: Physical Therapy

## 2019-07-20 ENCOUNTER — Encounter: Payer: Self-pay | Admitting: Physical Therapy

## 2019-07-20 DIAGNOSIS — R262 Difficulty in walking, not elsewhere classified: Secondary | ICD-10-CM

## 2019-07-20 DIAGNOSIS — R600 Localized edema: Secondary | ICD-10-CM

## 2019-07-20 DIAGNOSIS — M25662 Stiffness of left knee, not elsewhere classified: Secondary | ICD-10-CM

## 2019-07-20 DIAGNOSIS — M25562 Pain in left knee: Secondary | ICD-10-CM

## 2019-07-20 NOTE — Therapy (Signed)
[facility] [place], [place], [address] Phone: [phone]   Fax:  [phone]  Physical Therapy Treatment  Patient Details  Name: [name] [name] MRN: [id number] Date of Birth: 1940-06-20 Referring Provider (PT): Dr. Gaynelle Arabian   Encounter Date: 07/20/2019  PT End of Session - 07/20/19 0915    Visit Number  13    Number of Visits  17    Date for PT Re-Evaluation  08/03/19    PT Start Time  0906    PT Stop Time  1001    PT Time Calculation (min)  55 min    Activity Tolerance  Patient tolerated treatment well    Behavior During Therapy  [facility] for tasks assessed/performed       Past Medical History:  Diagnosis Date  . AAA (abdominal aortic aneurysm) ([place])    gets abd [place] every 3 years to watch,last check 08/2018  . Arthritis   . BPH (benign prostatic hyperplasia)   . CAD (coronary artery disease)   . Colon polyps   . Dyslipidemia   . GERD (gastroesophageal reflux disease)   . Heart murmur   . Herniated disc   . Hiatal hernia   . History of kidney stones   . HTN (hypertension)   . Hyperlipidemia   . OSA on CPAP   . Tinnitus     Past Surgical History:  Procedure Laterality Date  . COLONOSCOPY    . CORONARY ARTERY BYPASS GRAFT  2006   LIMA to LAD, SVG to RCA, SVG to circumflex, SVG to diagonal   . mole removed  2003   stomach  . right knee surgery  2008  . TONSILLECTOMY    . TOTAL KNEE ARTHROPLASTY Right 04/27/2018   Procedure: TOTAL KNEE ARTHROPLASTY RIGHT;  Surgeon: Gaynelle Arabian, MD;  [facility]: [facility];  Service: Orthopedics;  Laterality: Right;  22min  . TOTAL KNEE ARTHROPLASTY Left 05/31/2019   Procedure: TOTAL KNEE ARTHROPLASTY;  Surgeon: Gaynelle Arabian, MD;  [facility];  Service: Orthopedics;  Laterality: Left;  14min  . UPPER GI ENDOSCOPY      There were no vitals filed for this visit.  Subjective Assessment - 07/20/19 0914    Subjective  COVID-19 screen performed prior to patient  entering clinic. Reports that any medial knee contact while sleeping in [place] bothers him.    Pertinent History  s/p L TKA on 05/31/2019, R TKA 04/27/2018    Patient Stated Goals  Walk without device with no pain.    Currently in Pain?  Yes    Pain Score  --   "not much"   Pain Location  Knee    Pain Orientation  Left    Pain Descriptors / Indicators  Discomfort    Pain Type  Surgical pain    Pain Onset  More than a month ago    Pain Frequency  Intermittent         [facility] PT Assessment - 07/20/19 0001      Assessment   Medical Diagnosis  L TKA, 05/31/2019    Referring Provider (PT)  Dr. Gaynelle Arabian    Onset Date/Surgical Date  05/31/19    Hand Dominance  Right    Next MD Visit  08/10/2019    Prior Therapy  yes following R TKA      Precautions   Precautions  None      Restrictions   Weight Bearing Restrictions  No  [place] Adult PT Treatment/Exercise - 07/20/19 0001      Knee/Hip Exercises: Aerobic   Recumbent Bike  L2, seat 5-3 x15 min      Knee/Hip Exercises: Machines for Strengthening   Cybex Knee Extension  20# 3x10 reps    Cybex Knee Flexion  40# 3x10 reps      Knee/Hip Exercises: Standing   Terminal Knee Extension  Strengthening;Left;2 sets;10 reps;Limitations    Terminal Knee Extension Limitations  Blue XTS      Knee/Hip Exercises: Supine   Bridges with Ball Squeeze  Strengthening;15 reps    Straight Leg Raises  AROM;Left;15 reps      Modalities   Modalities  Vasopneumatic      Vasopneumatic   Number Minutes Vasopneumatic   10 minutes    Vasopnuematic Location   Knee    Vasopneumatic Pressure  Medium    Vasopneumatic Temperature   34                  PT Long Term Goals - 07/06/19 1004      PT LONG TERM GOAL #1   Title  Patient will be independent with advanced HEP    Time  6    Period  Weeks    Status  On-going      PT LONG TERM GOAL #2   Title  Patient will demonstrate 120+ degrees of right knee flexion AROM  to improve ability to perform functional tasks.     Time  6    Period  Weeks    Status  On-going      PT LONG TERM GOAL #3   Title  Patient will demonstrate 0 degrees of right knee extension AROM to improve gait mechanics.     Period  Weeks    Status  On-going      PT LONG TERM GOAL #4   Title  Patient will negotiate steps with a reciprocating gait pattern with one rail to safely access bedroom.     Time  6    Period  Weeks    Status  On-going      PT LONG TERM GOAL #5   Title  Patient will ambulate community distances without an AD and right knee pain less than 3/10.    Time  6    Period  Weeks    Status  On-going            Plan - 07/20/19 1028    Clinical Impression Statement  Patient presented in clinic with minimal discomfort upon arrival. Patient able to tolerate progression of strengthening exercises well with increased resistance. Patient able to demonstrate good L quad activation and control. Normal vasopnuematic response noted following removal of the modality.    Personal Factors and Comorbidities  Comorbidity 3+    Comorbidities  BPH, CAD, GERD,  heart murmur, R TKA 04/27/2018, herinated disc HTN, colon polyps, CABG 2006    Examination-Activity Limitations  Squat;Stairs;Stand;Transfers;Sit    Examination-Participation Restrictions  Driving;Community Activity;Yard Work;Other    Stability/Clinical Decision Making  Stable/Uncomplicated    Rehab Potential  Good    PT Frequency  2x / week    PT Duration  8 weeks    PT Treatment/Interventions  ADLs/Self Care Home Management;Cryotherapy;Electrical Stimulation;Ultrasound;Gait training;Stair training;Functional mobility training;Therapeutic activities;Therapeutic exercise;Balance training;Neuromuscular re-education;Patient/family education;Manual techniques;Passive range of motion;Taping    PT Next Visit Plan  Continue progressing ROM, eccentric strength for stairs.    PT Home Exercise Plan  see pt insructions  Consulted  and Agree with Plan of Care  Patient       Patient will benefit from skilled therapeutic intervention in order to improve the following deficits and impairments:  Pain, Increased edema, Decreased mobility, Difficulty walking, Decreased range of motion, Decreased activity tolerance, Decreased strength, Impaired flexibility  Visit Diagnosis: Stiffness of left knee, not elsewhere classified  Difficulty in walking, not elsewhere classified  Localized edema  Acute pain of left knee     Problem List Patient Active Problem List   Diagnosis Date Noted  . Osteoarthritis, knee 04/28/2018  . OA (osteoarthritis) of knee 04/27/2018  . Osteoarthritis of knee 05/08/2017  . Tinnitus of both ears 09/10/2016  . Ventral hernia without obstruction or gangrene 03/28/2016  . AAA (abdominal aortic aneurysm) without rupture ([place]) 07/20/2015  . Obstructive sleep apnea 03/17/2014  . Erectile dysfunction due to arterial insufficiency 01/12/2014  . Thrombocytopenia ([place]) 12/30/2013  . Allergic rhinitis 10/01/2012  . BPH (benign prostatic hyperplasia) 10/01/2012  . PVC's (premature ventricular contractions) 09/01/2009  . COLONIC POLYPS 04/14/2009  . Hyperlipidemia LDL goal <70 04/14/2009  . Essential hypertension 04/14/2009  . Coronary atherosclerosis 04/14/2009  . HIATAL HERNIA 04/14/2009  . HERNIATED DISC 04/14/2009    Standley Brooking, PTA 07/20/2019, 10:42 AM  [facility] [address] [place], [place], [address] Phone: [phone]   Fax:  [phone]  Name: [name] [name] MRN: [id number] Date of Birth: 1940-09-27

## 2019-07-22 ENCOUNTER — Ambulatory Visit: Payer: Medicare Other | Admitting: Physical Therapy

## 2019-07-22 ENCOUNTER — Other Ambulatory Visit: Payer: Self-pay

## 2019-07-22 ENCOUNTER — Encounter: Payer: Self-pay | Admitting: Physical Therapy

## 2019-07-22 DIAGNOSIS — R600 Localized edema: Secondary | ICD-10-CM

## 2019-07-22 DIAGNOSIS — M25662 Stiffness of left knee, not elsewhere classified: Secondary | ICD-10-CM

## 2019-07-22 DIAGNOSIS — R262 Difficulty in walking, not elsewhere classified: Secondary | ICD-10-CM

## 2019-07-22 DIAGNOSIS — M25562 Pain in left knee: Secondary | ICD-10-CM

## 2019-07-22 NOTE — Therapy (Signed)
[facility] [place], [place], [address] Phone: [phone]   Fax:  [phone]  Physical Therapy Treatment  Patient Details  Name: [name] [name] MRN: [id number] Date of Birth: 01-04-1941 Referring Provider (PT): Dr. Gaynelle Arabian   Encounter Date: 07/22/2019  PT End of Session - 07/22/19 1109    Visit Number  14    Number of Visits  17    Date for PT Re-Evaluation  08/03/19    PT Start Time  0511    PT Stop Time  1116    PT Time Calculation (min)  41 min    Activity Tolerance  Patient tolerated treatment well    Behavior During Therapy  [facility] for tasks assessed/performed       Past Medical History:  Diagnosis Date  . AAA (abdominal aortic aneurysm) ([place])    gets abd [place] every 3 years to watch,last check 08/2018  . Arthritis   . BPH (benign prostatic hyperplasia)   . CAD (coronary artery disease)   . Colon polyps   . Dyslipidemia   . GERD (gastroesophageal reflux disease)   . Heart murmur   . Herniated disc   . Hiatal hernia   . History of kidney stones   . HTN (hypertension)   . Hyperlipidemia   . OSA on CPAP   . Tinnitus     Past Surgical History:  Procedure Laterality Date  . COLONOSCOPY    . CORONARY ARTERY BYPASS GRAFT  2006   LIMA to LAD, SVG to RCA, SVG to circumflex, SVG to diagonal   . mole removed  2003   stomach  . right knee surgery  2008  . TONSILLECTOMY    . TOTAL KNEE ARTHROPLASTY Right 04/27/2018   Procedure: TOTAL KNEE ARTHROPLASTY RIGHT;  Surgeon: Gaynelle Arabian, MD;  [facility]: [facility];  Service: Orthopedics;  Laterality: Right;  67mn  . TOTAL KNEE ARTHROPLASTY Left 05/31/2019   Procedure: TOTAL KNEE ARTHROPLASTY;  Surgeon: AGaynelle Arabian MD;  [facility];  Service: Orthopedics;  Laterality: Left;  545m  . UPPER GI ENDOSCOPY      There were no vitals filed for this visit.  Subjective Assessment - 07/22/19 1038    Subjective  COVID-19 screen performed prior to patient  entering clinic. Patient arrived and reported a little stiff today, played golf yesterday    Pertinent History  s/p L TKA on 05/31/2019, R TKA 04/27/2018    Patient Stated Goals  Walk without device with no pain.    Currently in Pain?  Yes    Pain Score  --   "just stiffness"   Pain Location  Knee    Pain Orientation  Left    Pain Descriptors / Indicators  Tightness    Pain Type  Surgical pain    Pain Onset  More than a month ago    Pain Frequency  Intermittent    Aggravating Factors   prolong walking    Pain Relieving Factors  rest         [facility] PT Assessment - 07/22/19 0001      AROM   AROM Assessment Site  Knee    Right/Left Knee  Left    Left Knee Extension  0    Left Knee Flexion  121                    [facility] Adult PT Treatment/Exercise - 07/22/19 0001      Knee/Hip Exercises: Aerobic  Recumbent Bike  L2, seat 4-2 x15 min      Knee/Hip Exercises: Machines for Strengthening   Cybex Knee Extension  20# 3x10 reps    Cybex Knee Flexion  40# 3x10 reps      Knee/Hip Exercises: Standing   Terminal Knee Extension  Strengthening;Left;10 reps;Limitations;3 sets    Terminal Knee Extension Limitations  Blue XTS      Knee/Hip Exercises: Supine   Bridges with Clamshell  Strengthening;Both;20 reps   red band   Straight Leg Raise with External Rotation  Strengthening;Left;20 reps      Vasopneumatic   Number Minutes Vasopneumatic   10 minutes    Vasopnuematic Location   Knee    Vasopneumatic Pressure  Medium    Vasopneumatic Temperature   34                  PT Long Term Goals - 07/22/19 1040      PT LONG TERM GOAL #1   Title  Patient will be independent with advanced HEP    Time  6    Period  Weeks    Status  On-going      PT LONG TERM GOAL #2   Title  Patient will demonstrate 120+ degrees of right knee flexion AROM to improve ability to perform functional tasks.     Baseline  Met 07/22/19    Time  6    Period  Weeks    Status  Achieved    met AROM 121 degrees 07/22/19     PT LONG TERM GOAL #3   Title  Patient will demonstrate 0 degrees of right knee extension AROM to improve gait mechanics.     Baseline  Met 07/22/19    Time  6    Period  Weeks    Status  On-going   met AROM 0 degrees 07/22/19     PT LONG TERM GOAL #4   Title  Patient will negotiate steps with a reciprocating gait pattern with one rail to safely access bedroom.     Time  6    Period  Weeks    Status  On-going      PT LONG TERM GOAL #5   Title  Patient will ambulate community distances without an AD and right knee pain less than 3/10.    Baseline  Met 07/22/19    Time  6    Period  Weeks    Status  Achieved   07/22/19           Plan - 07/22/19 1104    Clinical Impression Statement  Patient tolererated treatment well today. Patient has overall improved and able to walk a community distance with no pain and is able to golf and perform ADL's with greater ease. Patient has improved ROM and met LTG 32, #3, and #5 today. Patient remaining goals progressing.    Personal Factors and Comorbidities  Comorbidity 3+    Comorbidities  BPH, CAD, GERD,  heart murmur, R TKA 04/27/2018, herinated disc HTN, colon polyps, CABG 2006    Examination-Activity Limitations  Squat;Stairs;Stand;Transfers;Sit    Examination-Participation Restrictions  Driving;Community Activity;Yard Work;Other    Stability/Clinical Decision Making  Stable/Uncomplicated    Rehab Potential  Good    PT Frequency  2x / week    PT Duration  8 weeks    PT Treatment/Interventions  ADLs/Self Care Home Management;Cryotherapy;Electrical Stimulation;Ultrasound;Gait training;Stair training;Functional mobility training;Therapeutic activities;Therapeutic exercise;Balance training;Neuromuscular re-education;Patient/family education;Manual techniques;Passive range of motion;Taping    PT  Next Visit Plan  Continue progressing eccentric strength for stairs and establish advanced HEP    Consulted and Agree  with Plan of Care  Patient       Patient will benefit from skilled therapeutic intervention in order to improve the following deficits and impairments:  Pain, Increased edema, Decreased mobility, Difficulty walking, Decreased range of motion, Decreased activity tolerance, Decreased strength, Impaired flexibility  Visit Diagnosis: Stiffness of left knee, not elsewhere classified  Difficulty in walking, not elsewhere classified  Localized edema  Acute pain of left knee     Problem List Patient Active Problem List   Diagnosis Date Noted  . Osteoarthritis, knee 04/28/2018  . OA (osteoarthritis) of knee 04/27/2018  . Osteoarthritis of knee 05/08/2017  . Tinnitus of both ears 09/10/2016  . Ventral hernia without obstruction or gangrene 03/28/2016  . AAA (abdominal aortic aneurysm) without rupture ([place]) 07/20/2015  . Obstructive sleep apnea 03/17/2014  . Erectile dysfunction due to arterial insufficiency 01/12/2014  . Thrombocytopenia ([place]) 12/30/2013  . Allergic rhinitis 10/01/2012  . BPH (benign prostatic hyperplasia) 10/01/2012  . PVC's (premature ventricular contractions) 09/01/2009  . COLONIC POLYPS 04/14/2009  . Hyperlipidemia LDL goal <70 04/14/2009  . Essential hypertension 04/14/2009  . Coronary atherosclerosis 04/14/2009  . HIATAL HERNIA 04/14/2009  . HERNIATED DISC 04/14/2009    Phillips Climes, PTA 07/22/2019, 11:20 AM  [facility] [place], [place], [address] Phone: [phone]   Fax:  [phone]  Name: [name] [name] MRN: [id number] Date of Birth: [birth year]

## 2019-07-27 ENCOUNTER — Encounter: Payer: Self-pay | Admitting: Physical Therapy

## 2019-07-27 ENCOUNTER — Ambulatory Visit: Payer: Medicare Other | Admitting: Physical Therapy

## 2019-07-27 DIAGNOSIS — M25662 Stiffness of left knee, not elsewhere classified: Secondary | ICD-10-CM

## 2019-07-27 DIAGNOSIS — R262 Difficulty in walking, not elsewhere classified: Secondary | ICD-10-CM

## 2019-07-27 NOTE — Therapy (Signed)
[facility] [place], [place], [address] Phone: [phone]   Fax:  [phone]  Physical Therapy Treatment  Patient Details  Name: [name] [name] MRN: [id number] Date of Birth: 1940-09-06 Referring Provider (PT): Dr. Gaynelle Arabian   Encounter Date: 07/27/2019  PT End of Session - 07/27/19 1245    Visit Number  15    Number of Visits  17    Date for PT Re-Evaluation  08/03/19    PT Start Time  1030    PT Stop Time  1126    PT Time Calculation (min)  56 min    Activity Tolerance  Patient tolerated treatment well    Behavior During Therapy  [facility] for tasks assessed/performed       Past Medical History:  Diagnosis Date  . AAA (abdominal aortic aneurysm) ([place])    gets abd [place] every 3 years to watch,last check 08/2018  . Arthritis   . BPH (benign prostatic hyperplasia)   . CAD (coronary artery disease)   . Colon polyps   . Dyslipidemia   . GERD (gastroesophageal reflux disease)   . Heart murmur   . Herniated disc   . Hiatal hernia   . History of kidney stones   . HTN (hypertension)   . Hyperlipidemia   . OSA on CPAP   . Tinnitus     Past Surgical History:  Procedure Laterality Date  . COLONOSCOPY    . CORONARY ARTERY BYPASS GRAFT  2006   LIMA to LAD, SVG to RCA, SVG to circumflex, SVG to diagonal   . mole removed  2003   stomach  . right knee surgery  2008  . TONSILLECTOMY    . TOTAL KNEE ARTHROPLASTY Right 04/27/2018   Procedure: TOTAL KNEE ARTHROPLASTY RIGHT;  Surgeon: Gaynelle Arabian, MD;  Location: [facility];  Service: Orthopedics;  Laterality: Right;  2mn  . TOTAL KNEE ARTHROPLASTY Left 05/31/2019   Procedure: TOTAL KNEE ARTHROPLASTY;  Surgeon: AGaynelle Arabian MD;  Location: [facility];  Service: Orthopedics;  Laterality: Left;  565m  . UPPER GI ENDOSCOPY      There were no vitals filed for this visit.  Subjective Assessment - 07/27/19 1241    Subjective  COVID-19 screen performed prior to patient  entering clinic.  Back to golfing.  Get tired at about the 14th hole.    Pertinent History  s/p L TKA on 05/31/2019, R TKA 04/27/2018    Pain Score  --   "Stiff."   Pain Location  Knee    Pain Orientation  Left    Pain Descriptors / Indicators  --   "Stiff."   Pain Type  Surgical pain    Pain Onset  More than a month ago                        [facility] 07/27/19 0001      Exercises   Exercises  Knee/Hip      Knee/Hip Exercises: Aerobic   Recumbent Bike  16 minutes.      Knee/Hip Exercises: Machines for Strengthening   Cybex Knee Extension  20# x 4 minutes.    Cybex Knee Flexion  50# x 4 minutes.    Cybex Leg Press  3 plates x 4 minutes.      Modalities   Modalities  Vasopneumatic      Vasopneumatic   Number Minutes Vasopneumatic   15 minutes    Vasopnuematic  Location   --   Left knee.   Vasopneumatic Pressure  Medium      Manual Therapy   Passive ROM  Left knee PROM in supine x 3 minutes into flexion.                  PT Long Term Goals - 07/22/19 1040      PT LONG TERM GOAL #1   Title  Patient will be independent with advanced HEP    Time  6    Period  Weeks    Status  On-going      PT LONG TERM GOAL #2   Title  Patient will demonstrate 120+ degrees of right knee flexion AROM to improve ability to perform functional tasks.     Baseline  Met 07/22/19    Time  6    Period  Weeks    Status  Achieved   met AROM 121 degrees 07/22/19     PT LONG TERM GOAL #3   Title  Patient will demonstrate 0 degrees of right knee extension AROM to improve gait mechanics.     Baseline  Met 07/22/19    Time  6    Period  Weeks    Status  On-going   met AROM 0 degrees 07/22/19     PT LONG TERM GOAL #4   Title  Patient will negotiate steps with a reciprocating gait pattern with one rail to safely access bedroom.     Time  6    Period  Weeks    Status  On-going      PT LONG TERM GOAL #5   Title  Patient will ambulate  community distances without an AD and right knee pain less than 3/10.    Baseline  Met 07/22/19    Time  6    Period  Weeks    Status  Achieved   07/22/19           Plan - 07/27/19 1249    Clinical Impression Statement  Excellent job with post-stretching left knee active flexion to 133 degrees.    Personal Factors and Comorbidities  Comorbidity 3+    Comorbidities  BPH, CAD, GERD,  heart murmur, R TKA 04/27/2018, herinated disc HTN, colon polyps, CABG 2006    Examination-Activity Limitations  Squat;Stairs;Stand;Transfers;Sit    Stability/Clinical Decision Making  Stable/Uncomplicated    Rehab Potential  Good    PT Frequency  2x / week    PT Duration  8 weeks    PT Treatment/Interventions  ADLs/Self Care Home Management;Cryotherapy;Electrical Stimulation;Ultrasound;Gait training;Stair training;Functional mobility training;Therapeutic activities;Therapeutic exercise;Balance training;Neuromuscular re-education;Patient/family education;Manual techniques;Passive range of motion;Taping    PT Next Visit Plan  Continue progressing eccentric strength for stairs and establish advanced HEP    PT Home Exercise Plan  see pt insructions    Consulted and Agree with Plan of Care  Patient       Patient will benefit from skilled therapeutic intervention in order to improve the following deficits and impairments:  Pain, Increased edema, Decreased mobility, Difficulty walking, Decreased range of motion, Decreased activity tolerance, Decreased strength, Impaired flexibility  Visit Diagnosis: Stiffness of left knee, not elsewhere classified  Difficulty in walking, not elsewhere classified     Problem List Patient Active Problem List   Diagnosis Date Noted  . Osteoarthritis, knee 04/28/2018  . OA (osteoarthritis) of knee 04/27/2018  . Osteoarthritis of knee 05/08/2017  . Tinnitus of both ears 09/10/2016  . Ventral hernia without obstruction  or gangrene 03/28/2016  . AAA (abdominal aortic  aneurysm) without rupture ([place]) 07/20/2015  . Obstructive sleep apnea 03/17/2014  . Erectile dysfunction due to arterial insufficiency 01/12/2014  . Thrombocytopenia ([place]) 12/30/2013  . Allergic rhinitis 10/01/2012  . BPH (benign prostatic hyperplasia) 10/01/2012  . PVC's (premature ventricular contractions) 09/01/2009  . COLONIC POLYPS 04/14/2009  . Hyperlipidemia LDL goal <70 04/14/2009  . Essential hypertension 04/14/2009  . Coronary atherosclerosis 04/14/2009  . HIATAL HERNIA 04/14/2009  . HERNIATED DISC 04/14/2009    Quran Vasco, [place] MPT 07/27/2019, 12:50 PM  [facility] [address] [place], [place], [address] Phone: [phone]   Fax:  [phone]  Name: Channon Ambrosini MRN: [id number] Date of Birth: [birth year]

## 2019-07-29 ENCOUNTER — Ambulatory Visit: Payer: Medicare Other | Admitting: Physical Therapy

## 2019-07-29 ENCOUNTER — Encounter: Payer: Self-pay | Admitting: Physical Therapy

## 2019-07-29 ENCOUNTER — Other Ambulatory Visit: Payer: Self-pay

## 2019-07-29 DIAGNOSIS — M25562 Pain in left knee: Secondary | ICD-10-CM

## 2019-07-29 DIAGNOSIS — M25662 Stiffness of left knee, not elsewhere classified: Secondary | ICD-10-CM

## 2019-07-29 DIAGNOSIS — R600 Localized edema: Secondary | ICD-10-CM

## 2019-07-29 DIAGNOSIS — R262 Difficulty in walking, not elsewhere classified: Secondary | ICD-10-CM

## 2019-07-29 NOTE — Therapy (Signed)
[facility] [place], [place], [address] Phone: [phone]   Fax:  [phone]  Physical Therapy Treatment  Patient Details  Name: [name] [name] MRN: [id number] Date of Birth: 1941-02-04 Referring Provider (PT): Dr. Gaynelle Arabian   Encounter Date: 07/29/2019  PT End of Session - 07/29/19 1037    Visit Number  16    Number of Visits  17    Date for PT Re-Evaluation  08/03/19    PT Start Time  1036    PT Stop Time  1117    PT Time Calculation (min)  41 min    Activity Tolerance  Patient tolerated treatment well    Behavior During Therapy  [facility] for tasks assessed/performed       Past Medical History:  Diagnosis Date  . AAA (abdominal aortic aneurysm) ([place])    gets abd [place] every 3 years to watch,last check 08/2018  . Arthritis   . BPH (benign prostatic hyperplasia)   . CAD (coronary artery disease)   . Colon polyps   . Dyslipidemia   . GERD (gastroesophageal reflux disease)   . Heart murmur   . Herniated disc   . Hiatal hernia   . History of kidney stones   . HTN (hypertension)   . Hyperlipidemia   . OSA on CPAP   . Tinnitus     Past Surgical History:  Procedure Laterality Date  . COLONOSCOPY    . CORONARY ARTERY BYPASS GRAFT  2006   LIMA to LAD, SVG to RCA, SVG to circumflex, SVG to diagonal   . mole removed  2003   stomach  . right knee surgery  2008  . TONSILLECTOMY    . TOTAL KNEE ARTHROPLASTY Right 04/27/2018   Procedure: TOTAL KNEE ARTHROPLASTY RIGHT;  Surgeon: Gaynelle Arabian, MD;  Location: [facility];  Service: Orthopedics;  Laterality: Right;  15mn  . TOTAL KNEE ARTHROPLASTY Left 05/31/2019   Procedure: TOTAL KNEE ARTHROPLASTY;  Surgeon: AGaynelle Arabian MD;  Location: [facility];  Service: Orthopedics;  Laterality: Left;  557m  . UPPER GI ENDOSCOPY      There were no vitals filed for this visit.  Subjective Assessment - 07/29/19 1037    Subjective  COVID-19 screen performed prior to patient  entering clinic.  Back to golfing. Reports getting overall fatigued at about 13-14th hole and reports fatigue with yardwork from a lot of squatting and bending. Reports he and his wife are walking 1.5-2 mi every other day.    Pertinent History  s/p L TKA on 05/31/2019, R TKA 04/27/2018    Patient Stated Goals  Walk without device with no pain.    Currently in Pain?  No/denies         [facility] - 07/29/19 0001      Assessment   Medical Diagnosis  L TKA, 05/31/2019    Referring Provider (PT)  Dr. FrGaynelle Arabian  Onset Date/Surgical Date  05/31/19    Hand Dominance  Right    Next MD Visit  08/10/2019    Prior Therapy  yes following R TKA      Precautions   Precautions  None      Restrictions   Weight Bearing Restrictions  No      ROM / Strength   AROM / PROM / Strength  AROM      AROM   Overall AROM   Within functional limits for tasks performed    AROM Assessment Site  Knee    Right/Left Knee  Left    Left Knee Extension  0    Left Knee Flexion  134                    [facility] Adult PT Treatment/Exercise - 07/29/19 0001      Ambulation/Gait   Stairs  Yes    Stairs Assistance  6: Modified independent (Device/Increase time)   increased time descending but due to caution   Stair Management Technique  One rail Right;Alternating pattern;Forwards    Number of Stairs  4   x3 RT   Height of Stairs  6.5    Curb  7: Independent      Knee/Hip Exercises: Aerobic   Recumbent Bike  L4, seat 4-1 x15 min      Knee/Hip Exercises: Machines for Strengthening   Cybex Knee Extension  20# 3x10 reps    Cybex Knee Flexion  40# 3x10 rep    Cybex Leg Press  2 pl, seat 6 x30 reps with red theraband clam      Knee/Hip Exercises: Standing   SLS  LLE SLS x1 min; fingertip support    Walking with Sports Cord  3D resisted walking with blue XTS x10 reps each                  PT Long Term Goals - 07/29/19 1121      PT LONG TERM GOAL #1   Title  Patient will be  independent with advanced HEP    Time  6    Period  Weeks    Status  Achieved      PT LONG TERM GOAL #2   Title  Patient will demonstrate 120+ degrees of right knee flexion AROM to improve ability to perform functional tasks.     Baseline  Met 07/22/19    Time  6    Period  Weeks    Status  Achieved   met AROM 121 degrees 07/22/19     PT LONG TERM GOAL #3   Title  Patient will demonstrate 0 degrees of right knee extension AROM to improve gait mechanics.     Baseline  Met 07/22/19    Time  6    Period  Weeks    Status  Achieved   met AROM 0 degrees 07/22/19     PT LONG TERM GOAL #4   Title  Patient will negotiate steps with a reciprocating gait pattern with one rail to safely access bedroom.     Time  6    Period  Weeks    Status  Achieved      PT LONG TERM GOAL #5   Title  Patient will ambulate community distances without an AD and right knee pain less than 3/10.    Baseline  Met 07/22/19    Time  6    Period  Weeks    Status  Achieved   07/22/19           Plan - 07/29/19 1128    Clinical Impression Statement  Patient has progressed well and has returned to function and recreational activities at a much faster pace than following R TKR. Patient has progresed with all strengthening exercises and only reports fatigue and lack of stamina with physical activity. Patient has met all goals set at evaluation and achieved a great FOTO score. AROM of L knee measured as 0-134 deg.    Personal Factors and Comorbidities  Comorbidity  3+    Comorbidities  BPH, CAD, GERD,  heart murmur, R TKA 04/27/2018, herinated disc HTN, colon polyps, CABG 2006    Examination-Activity Limitations  Squat;Stairs;Stand;Transfers;Sit    Examination-Participation Restrictions  Driving;Community Activity;Yard Work;Other    Stability/Clinical Decision Making  Stable/Uncomplicated    Rehab Potential  Good    PT Frequency  2x / week    PT Duration  8 weeks    PT Treatment/Interventions  ADLs/Self Care Home  Management;Cryotherapy;Electrical Stimulation;Ultrasound;Gait training;Stair training;Functional mobility training;Therapeutic activities;Therapeutic exercise;Balance training;Neuromuscular re-education;Patient/family education;Manual techniques;Passive range of motion;Taping    PT Next Visit Plan  D/C summary required.    PT Home Exercise Plan  see pt insructions    Consulted and Agree with Plan of Care  Patient       Patient will benefit from skilled therapeutic intervention in order to improve the following deficits and impairments:  Pain, Increased edema, Decreased mobility, Difficulty walking, Decreased range of motion, Decreased activity tolerance, Decreased strength, Impaired flexibility  Visit Diagnosis: Stiffness of left knee, not elsewhere classified  Difficulty in walking, not elsewhere classified  Localized edema  Acute pain of left knee     Problem List Patient Active Problem List   Diagnosis Date Noted  . Osteoarthritis, knee 04/28/2018  . OA (osteoarthritis) of knee 04/27/2018  . Osteoarthritis of knee 05/08/2017  . Tinnitus of both ears 09/10/2016  . Ventral hernia without obstruction or gangrene 03/28/2016  . AAA (abdominal aortic aneurysm) without rupture ([place]) 07/20/2015  . Obstructive sleep apnea 03/17/2014  . Erectile dysfunction due to arterial insufficiency 01/12/2014  . Thrombocytopenia ([place]) 12/30/2013  . Allergic rhinitis 10/01/2012  . BPH (benign prostatic hyperplasia) 10/01/2012  . PVC's (premature ventricular contractions) 09/01/2009  . COLONIC POLYPS 04/14/2009  . Hyperlipidemia LDL goal <70 04/14/2009  . Essential hypertension 04/14/2009  . Coronary atherosclerosis 04/14/2009  . HIATAL HERNIA 04/14/2009  . HERNIATED DISC 04/14/2009    Standley Brooking, PTA 07/29/19 11:32 AM   [facility] [address] [place], [place], [address] Phone: [phone]   Fax:  [phone]  Name: [name] [name] MRN: [id number] Date of Birth: 04/17/1940  PHYSICAL THERAPY DISCHARGE SUMMARY  Visits from Start of Care: 16.  Current functional level related to goals / functional outcomes: See above.   Remaining deficits: All goals met.   Education / Equipment: HEP. Plan: Patient agrees to discharge.  Patient goals were met. Patient is being discharged due to meeting the stated rehab goals.  ?????         Mali Applegate MPT

## 2019-08-17 ENCOUNTER — Encounter: Payer: Self-pay | Admitting: Family Medicine

## 2019-08-17 ENCOUNTER — Telehealth: Payer: Self-pay | Admitting: Family Medicine

## 2019-08-17 ENCOUNTER — Ambulatory Visit (INDEPENDENT_AMBULATORY_CARE_PROVIDER_SITE_OTHER): Payer: Medicare Other

## 2019-08-17 ENCOUNTER — Ambulatory Visit (INDEPENDENT_AMBULATORY_CARE_PROVIDER_SITE_OTHER): Payer: Medicare Other | Admitting: Family Medicine

## 2019-08-17 ENCOUNTER — Other Ambulatory Visit: Payer: Self-pay

## 2019-08-17 VITALS — BP 137/81 | HR 72 | Temp 97.8°F | Ht 72.0 in | Wt 212.0 lb

## 2019-08-17 DIAGNOSIS — I714 Abdominal aortic aneurysm, without rupture, unspecified: Secondary | ICD-10-CM

## 2019-08-17 DIAGNOSIS — R911 Solitary pulmonary nodule: Secondary | ICD-10-CM | POA: Diagnosis not present

## 2019-08-17 DIAGNOSIS — I10 Essential (primary) hypertension: Secondary | ICD-10-CM

## 2019-08-17 LAB — CBC
Hematocrit: 41.6 % (ref 37.5–51.0)
Hemoglobin: 14.2 g/dL (ref 13.0–17.7)
MCH: 31.3 pg (ref 26.6–33.0)
MCHC: 34.1 g/dL (ref 31.5–35.7)
MCV: 92 fL (ref 79–97)
Platelets: 167 10*3/uL (ref 150–450)
RBC: 4.54 x10E6/uL (ref 4.14–5.80)
RDW: 12.5 % (ref 11.6–15.4)
WBC: 5.6 10*3/uL (ref 3.4–10.8)

## 2019-08-17 LAB — BASIC METABOLIC PANEL
BUN/Creatinine Ratio: 24 (ref 10–24)
BUN: 19 mg/dL (ref 8–27)
CO2: 23 mmol/L (ref 20–29)
Calcium: 9.7 mg/dL (ref 8.6–10.2)
Chloride: 106 mmol/L (ref 96–106)
Creatinine, Ser: 0.79 mg/dL (ref 0.76–1.27)
GFR calc Af Amer: 101 mL/min/{1.73_m2} (ref >59)
GFR calc non Af Amer: 87 mL/min/{1.73_m2} (ref >59)
Glucose: 101 mg/dL — ABNORMAL HIGH (ref 65–99)
Potassium: 4.4 mmol/L (ref 3.5–5.2)
Sodium: 143 mmol/L (ref 134–144)

## 2019-08-17 LAB — LIPID PANEL
Chol/HDL Ratio: 2.2 ratio (ref 0.0–5.0)
Cholesterol, Total: 112 mg/dL (ref 100–199)
HDL: 51 mg/dL (ref >39)
LDL Chol Calc (NIH): 46 mg/dL (ref 0–99)
Triglycerides: 69 mg/dL (ref 0–149)
VLDL Cholesterol Cal: 15 mg/dL (ref 5–40)

## 2019-08-17 NOTE — Telephone Encounter (Signed)
Pt aware of provider feedback and states he will just wait as he didn't realize he had just had a UA 4 months ago.

## 2019-08-17 NOTE — Telephone Encounter (Signed)
We just did his urinalysis in February but if he is concerned, he can leave a sample.

## 2019-08-17 NOTE — Telephone Encounter (Signed)
Pt states that he does not have an appt with his urologist and he is wanting to know if Dr. Darnell Level would like him to come back to the office today or this week to leave a urine sample to have a uranalysis.

## 2019-08-17 NOTE — Patient Instructions (Signed)

## 2019-08-17 NOTE — Telephone Encounter (Signed)
Pt has family history of bladder cancer  No urologist

## 2019-08-17 NOTE — Progress Notes (Signed)
Subjective: CC: Hyperlipidemia, CAD PCP: [name], DO VEL:FYBOFB [name] [name] is a 79 y.o. male presenting to clinic today for:  1.  Hypertension with hyperlipidemia, CAD Patient reports compliance with Repatha, Lopressor 25 mg twice daily.  He has an upcoming appointment with Dr. Percival Spanish soon.  They will be getting a repeat ultrasound of his aorta on the 25th.  No chest pain, shortness of breath.  He does still have some easy fatigability but he thinks this is more related to recent surgery.  He is golfing regularly but does tire out some by the 16th hole.    ROS: Per HPI  Allergies  Allergen Reactions  . Levofloxacin     Caused C- Diff  . Crestor [Rosuvastatin] Other (See Comments)    Liver problems  . Vytorin [Ezetimibe-Simvastatin] Other (See Comments)    Liver problems   Past Medical History:  Diagnosis Date  . AAA (abdominal aortic aneurysm) ([place])    gets abd [place] every 3 years to watch,last check 08/2018  . Arthritis   . BPH (benign prostatic hyperplasia)   . CAD (coronary artery disease)   . Colon polyps   . Dyslipidemia   . GERD (gastroesophageal reflux disease)   . Heart murmur   . Herniated disc   . Hiatal hernia   . History of kidney stones   . HTN (hypertension)   . Hyperlipidemia   . OSA on CPAP   . Tinnitus     Current Outpatient Medications:  .  acetaminophen (TYLENOL) 325 MG tablet, Take 650 mg by mouth every 6 (six) hours as needed (for pain.)., Disp: , Rfl:  .  Digestive Enzymes (ENZYME DIGEST PO), Take 1 capsule by mouth at bedtime., Disp: , Rfl:  .  Evolocumab (REPATHA SURECLICK) 510 MG/ML SOAJ, Inject 140 mLs into the skin every 14 (fourteen) days., Disp: 6 mL, Rfl: 1 .  fluticasone (FLONASE) 50 MCG/ACT nasal spray, Place 2 sprays into both nostrils daily as needed for allergies or rhinitis., Disp: , Rfl:  .  gabapentin (NEURONTIN) 100 MG capsule, Take 200 mg three times a day for two weeks following surgery.Then take 200 mg two times a  day for two weeks. Then take 200 mg once a day for two weeks. Then discontinue., Disp: 168 capsule, Rfl: 0 .  methocarbamol (ROBAXIN) 500 MG tablet, Take 1 tablet (500 mg total) by mouth every 6 (six) hours as needed for muscle spasms., Disp: 40 tablet, Rfl: 0 .  metoprolol tartrate (LOPRESSOR) 25 MG tablet, Take 1 tablet (25 mg total) by mouth 2 (two) times daily., Disp: 180 tablet, Rfl: 1 .  nitroGLYCERIN (NITROSTAT) 0.4 MG SL tablet, Place 1 tablet (0.4 mg total) under the tongue every 5 (five) minutes as needed for chest pain., Disp: 25 tablet, Rfl: prn .  omeprazole (PRILOSEC) 20 MG capsule, Take 20 mg by mouth daily., Disp: , Rfl:  .  oxyCODONE (OXY IR/ROXICODONE) 5 MG immediate release tablet, Take 1-2 tablets (5-10 mg total) by mouth every 6 (six) hours as needed for severe pain., Disp: 56 tablet, Rfl: 0 .  Probiotic Product (PROBIOTIC PO), Take 1 capsule by mouth daily., Disp: , Rfl:  .  rivaroxaban (XARELTO) 10 MG TABS tablet, Take 1 tablet (10 mg total) by mouth daily with breakfast for 20 days. Then resume one 81 mg aspirin once a day., Disp: 20 tablet, Rfl: 0 .  traMADol (ULTRAM) 50 MG tablet, Take 1-2 tablets (50-100 mg total) by mouth every 6 (six) hours  as needed for moderate pain., Disp: 40 tablet, Rfl: 0 .  triamcinolone cream (KENALOG) 0.1 %, Apply 1 application topically 2 (two) times daily. x7-10 days, Disp: 30 g, Rfl: 0 Social History   Socioeconomic History  . Marital status: Married    Spouse name: Constance Holster  . Number of children: 2  . Years of education: Not on file  . Highest education level: Bachelor's degree (e.g., BA, AB, BS)  Occupational History  . Occupation: Armed forces technical officer for [employer]  Tobacco Use  . Smoking status: Never Smoker  . Smokeless tobacco: Never Used  . Tobacco comment: does not smoke   Vaping Use  . Vaping Use: Never used  Substance and Sexual Activity  . Alcohol use: Yes    Alcohol/week: 1.0 standard drink    Types: 1 Glasses of  wine per week    Comment: with meal  . Drug use: No  . Sexual activity: Yes  Other Topics Concern  . Not on file  Social History Narrative   Married 37+ years, 2 adult children and one granddaughter. Engineer, building services at [employer].    Social Determinants of Health   Financial Resource Strain:   . Difficulty of Paying Living Expenses:   Food Insecurity:   . Worried About Charity fundraiser in the Last Year:   . Arboriculturist in the Last Year:   Transportation Needs:   . Film/video editor (Medical):   Marland Kitchen Lack of Transportation (Non-Medical):   Physical Activity:   . Days of Exercise per Week:   . Minutes of Exercise per Session:   Stress:   . Feeling of Stress :   Social Connections:   . Frequency of Communication with Friends and Family:   . Frequency of Social Gatherings with Friends and Family:   . Attends Religious Services:   . Active Member of Clubs or Organizations:   . Attends Archivist Meetings:   Marland Kitchen Marital Status:   Intimate Partner Violence:   . Fear of Current or Ex-Partner:   . Emotionally Abused:   Marland Kitchen Physically Abused:   . Sexually Abused:    Family History  Problem Relation Age of Onset  . Heart disease Father   . Cancer Father        Bladder Cancer  . Heart attack Father   . Colon polyps Father   . Hypertension Mother   . Alzheimer's disease Mother   . Stroke Mother        3-4   . Diabetes Brother   . Heart disease Brother   . Arthritis Brother        bilateral knee replacement, back issues   . Pneumonia Sister   . GI problems Sister        esophogus stretched   . Crohn's disease Son     Objective: Office vital signs reviewed. BP 137/81   Pulse 72   Temp 97.8 F (36.6 C) (Temporal)   Ht 6' (1.829 m)   Wt 212 lb (96.2 kg)   SpO2 97%   BMI 28.75 kg/m   Physical Examination:  General: Awake, alert, well nourished, No acute distress HEENT: Normal; sclera white.  Cardio: regular rate and rhythm, S1S2 heard, no  murmurs appreciated Pulm: clear to auscultation bilaterally, no wheezes, rhonchi or rales; normal work of breathing on room air Extremities: warm, well perfused, No edema, cyanosis or clubbing; +2 pulses bilaterally MSK: Slightly stiff gait when getting up from a seated position.  He has a well-healed scar on bilateral knees.  Left knee with some soft tissue swelling but appears benign. Skin: dry; intact; no rashes or lesions  Assessment/ Plan: 79 y.o. male   1. Essential hypertension Well-controlled. - Basic Metabolic Panel  2. AAA (abdominal aortic aneurysm) without rupture (HCC) Check fasting lipid panel - Lipid Panel  3. Lesion of left lung Previously thought to have a granuloma in the left lower lobe.  Will obtain chest x-ray to reevaluate. - DG Chest 2 View; Future - CBC   No orders of the defined types were placed in this encounter.  No orders of the defined types were placed in this encounter.    [name], DO [place] [phone]

## 2019-08-27 ENCOUNTER — Ambulatory Visit (HOSPITAL_COMMUNITY)
Admission: RE | Admit: 2019-08-27 | Discharge: 2019-08-27 | Disposition: A | Discharge status: Home / Self Care | Payer: Medicare Other | Source: Ambulatory Visit | Attending: Cardiology | Admitting: Cardiology

## 2019-08-27 ENCOUNTER — Other Ambulatory Visit (HOSPITAL_COMMUNITY): Payer: Self-pay | Admitting: Cardiology

## 2019-08-27 ENCOUNTER — Other Ambulatory Visit: Payer: Self-pay

## 2019-08-27 DIAGNOSIS — I714 Abdominal aortic aneurysm, without rupture, unspecified: Secondary | ICD-10-CM

## 2019-09-16 ENCOUNTER — Other Ambulatory Visit: Payer: Self-pay | Admitting: Family Medicine

## 2019-09-20 ENCOUNTER — Ambulatory Visit (INDEPENDENT_AMBULATORY_CARE_PROVIDER_SITE_OTHER): Payer: Medicare Other | Admitting: *Deleted

## 2019-09-20 DIAGNOSIS — Z0000 Encounter for general adult medical examination without abnormal findings: Secondary | ICD-10-CM

## 2019-09-20 NOTE — Patient Instructions (Signed)

## 2019-09-20 NOTE — Progress Notes (Signed)
MEDICARE ANNUAL WELLNESS VISIT  09/20/2019  Telephone Visit Disclaimer This Medicare AWV was conducted by telephone due to national recommendations for restrictions regarding the COVID-19 Pandemic (e.g. social distancing).  I verified, using two identifiers, that I am speaking with [name] [name] or their authorized healthcare agent. I discussed the limitations, risks, security, and privacy concerns of performing an evaluation and management service by telephone and the potential availability of an in-person appointment in the future. The patient expressed understanding and agreed to proceed.   Subjective:  [name] [name] is a 79 y.o. male patient of [name], DO who had a Medicare Annual Wellness Visit today via telephone. [name] [name] is Retired and lives with their spouse. he has 2 children. he reports that he is socially active and does interact with friends/family regularly. 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  Patient Care Team: [name], DO as PCP - General (Family Medicine) Minus Breeding, MD as PCP - Cardiology (Cardiology) Rutherford Guys, MD as Consulting Physician (Ophthalmology) Clarene Essex, MD as Consulting Physician (Gastroenterology) Irine Seal, MD as Attending Physician (Urology) Gaynelle Arabian, MD as Consulting Physician (Orthopedic Surgery) Lavonna Monarch, MD as Consulting Physician (Dermatology)  Advanced Directives 09/20/2019 06/01/2019 05/31/2019 05/24/2019 09/18/2018 05/01/2018 04/27/2018  Does Patient Have a Medical Advance Directive? Yes Yes Yes Yes Yes Yes Yes  Type of Paramedic of [place];Living will Living will;Healthcare Power of [place];Living will [place];Living will Living will;Healthcare Power of [place];Living will [place];Living will  Does patient want to  make changes to medical advance directive? No - Patient declined - No - Patient declined No - Patient declined No - Patient declined - No - Patient declined  Copy of [place] in Chart? No - copy requested No - copy requested No - copy requested No - copy requested No - copy requested - No - copy requested  Would patient like information on creating a medical advance directive? - - - - - [facility] Utilization Over the Past 12 Months: # of hospitalizations or ER visits: 0 # of surgeries: 1  Review of Systems    Patient reports that his overall health is unchanged compared to last year.  History obtained from chart review and the patient  Patient Reported Readings (BP, Pulse, CBG, Weight, etc) none  Pain Assessment       Current Medications & Allergies (verified) Allergies as of 09/20/2019      Reactions   Levofloxacin    Caused C- Diff   Crestor [rosuvastatin] Other (See Comments)   Liver problems   Vytorin [ezetimibe-simvastatin] Other (See Comments)   Liver problems      Medication List       Accurate as of September 20, 2019 11:23 AM. If you have any questions, ask your nurse or doctor.        STOP taking these medications   rivaroxaban 10 MG Tabs tablet Commonly known as: XARELTO     TAKE these medications   acetaminophen 325 MG tablet Commonly known as: TYLENOL Take 650 mg by mouth every 6 (six) hours as needed (for pain.).   ENZYME DIGEST PO Take 1 capsule by mouth at bedtime.   fluticasone 50 MCG/ACT nasal spray Commonly known as: FLONASE Place 2 sprays into both nostrils daily as needed for allergies or rhinitis.  metoprolol tartrate 25 MG tablet Commonly known as: LOPRESSOR Take 1 tablet (25 mg total) by mouth 2 (two) times daily.   nitroGLYCERIN 0.4 MG SL tablet Commonly known as: NITROSTAT Place 1 tablet (0.4 mg total) under the tongue every 5 (five) minutes as needed for chest pain.   omeprazole 20 MG capsule Commonly  known as: PRILOSEC Take 20 mg by mouth daily.   PROBIOTIC PO Take 1 capsule by mouth daily.   Repatha SureClick 782 MG/ML Soaj Generic drug: Evolocumab INJECT 140MG  EVERY 2 WEEKS   triamcinolone cream 0.1 % Commonly known as: KENALOG Apply 1 application topically 2 (two) times daily. x7-10 days       History (reviewed): Past Medical History:  Diagnosis Date  . AAA (abdominal aortic aneurysm) ([place])    gets abd [place] every 3 years to watch,last check 08/2018  . Arthritis   . BPH (benign prostatic hyperplasia)   . CAD (coronary artery disease)   . Colon polyps   . Dyslipidemia   . GERD (gastroesophageal reflux disease)   . Heart murmur   . Herniated disc   . Hiatal hernia   . History of kidney stones   . HTN (hypertension)   . Hyperlipidemia   . OSA on CPAP   . Tinnitus    Past Surgical History:  Procedure Laterality Date  . COLONOSCOPY    . CORONARY ARTERY BYPASS GRAFT  2006   LIMA to LAD, SVG to RCA, SVG to circumflex, SVG to diagonal   . mole removed  2003   stomach  . right knee surgery  2008  . TONSILLECTOMY    . TOTAL KNEE ARTHROPLASTY Right 04/27/2018   Procedure: TOTAL KNEE ARTHROPLASTY RIGHT;  Surgeon: Gaynelle Arabian, MD;  Location: [facility];  Service: Orthopedics;  Laterality: Right;  9min  . TOTAL KNEE ARTHROPLASTY Left 05/31/2019   Procedure: TOTAL KNEE ARTHROPLASTY;  Surgeon: Gaynelle Arabian, MD;  Location: [facility];  Service: Orthopedics;  Laterality: Left;  6min  . UPPER GI ENDOSCOPY     Family History  Problem Relation Age of Onset  . Heart disease Father   . Cancer Father        Bladder Cancer  . Heart attack Father   . Colon polyps Father   . Hypertension Mother   . Alzheimer's disease Mother   . Stroke Mother        3-4   . Diabetes Brother   . Heart disease Brother   . Arthritis Brother        bilateral knee replacement, back issues   . Pneumonia Sister   . GI problems Sister        esophogus stretched   . Crohn's disease Son    Social  History   Socioeconomic History  . Marital status: Married    Spouse name: Constance Holster  . Number of children: 2  . Years of education: Not on file  . Highest education level: Bachelor's degree (e.g., BA, AB, BS)  Occupational History  . Occupation: Armed forces technical officer for [employer]  Tobacco Use  . Smoking status: Never Smoker  . Smokeless tobacco: Never Used  . Tobacco comment: does not smoke   Vaping Use  . Vaping Use: Never used  Substance and Sexual Activity  . Alcohol use: Yes    Alcohol/week: 1.0 standard drink    Types: 1 Glasses of wine per week    Comment: with meal  . Drug use: No  . Sexual activity: Yes  Other Topics  Concern  . Not on file  Social History Narrative   Married 37+ years, 2 adult children and one granddaughter. Engineer, building services at [employer].    Social Determinants of Health   Financial Resource Strain: Low Risk   . Difficulty of Paying Living Expenses: Not hard at all  Food Insecurity: No Food Insecurity  . Worried About Charity fundraiser in the Last Year: Never true  . Ran Out of Food in the Last Year: Never true  Transportation Needs: No Transportation Needs  . Lack of Transportation (Medical): No  . Lack of Transportation (Non-Medical): No  Physical Activity: Sufficiently Active  . Days of Exercise per Week: 5 days  . Minutes of Exercise per Session: 50 min  Stress: No Stress Concern Present  . Feeling of Stress : Not at all  Social Connections: Socially Integrated  . Frequency of Communication with Friends and Family: More than three times a week  . Frequency of Social Gatherings with Friends and Family: More than three times a week  . Attends Religious Services: More than 4 times per year  . Active Member of Clubs or Organizations: Yes  . Attends Archivist Meetings: More than 4 times per year  . Marital Status: Married    Activities of Daily Living In your present state of health, do you have any difficulty  performing the following activities: 09/20/2019 05/31/2019  Hearing? Y N  Comment tinnitus in both ears -  Vision? N N  Difficulty concentrating or making decisions? N N  Walking or climbing stairs? N Y  Dressing or bathing? N N  Doing errands, shopping? N N  Preparing Food and eating ? N -  Using the Toilet? N -  In the past six months, have you accidently leaked urine? N -  Do you have problems with loss of bowel control? N -  Managing your Medications? N -  Managing your Finances? N -  Housekeeping or managing your Housekeeping? N -  Comment has someone come in every 2 weeks for deep cleaning -  Some recent data might be hidden    Patient Education/ Literacy How often do you need to have someone help you when you read instructions, pamphlets, or other written materials from your doctor or pharmacy?: 1 - Never What is the last grade level you completed in school?: Bachelors Degree  Exercise Current Exercise Habits: Home exercise routine, Type of exercise: walking, Time (Minutes): 45, Frequency (Times/Week): 5, Weekly Exercise (Minutes/Week): 225, Intensity: Mild, Exercise limited by: orthopedic condition(s);cardiac condition(s)  Diet Patient reports consuming 3 meals a day and 1 snack(s) a day Patient reports that his primary diet is: Regular Patient reports that she does have regular access to food.   Depression Screen PHQ 2/9 Scores 09/20/2019 04/13/2019 12/02/2018 09/18/2018 06/25/2018 05/22/2018 01/06/2018  PHQ - 2 Score 0 0 0 0 0 0 0  PHQ- 9 Score - 0 0 - - - -     Fall Risk Fall Risk  09/20/2019 09/20/2019 04/13/2019 12/02/2018 09/18/2018  Falls in the past year? 0 0 0 0 1  Number falls in past yr: - - - - 0  Injury with Fall? - - - - 0  Comment - - - - -     Objective:  [name] [name] seemed alert and oriented and he participated appropriately during our telephone visit.  Blood Pressure Weight BMI  BP Readings from Last 3 Encounters:  08/17/19 137/81  06/01/19 129/60  06/01/19 (!) 145/74   Wt Readings from Last 3 Encounters:  08/17/19 212 lb (96.2 kg)  05/31/19 214 lb 1.1 oz (97.1 kg)  05/24/19 214 lb (97.1 kg)   BMI Readings from Last 1 Encounters:  08/17/19 28.75 kg/m    *Unable to obtain current vital signs, weight, and BMI due to telephone visit type  Hearing/Vision  . Marquie did not seem to have difficulty with hearing/understanding during the telephone conversation . Reports that he has had a formal eye exam by an eye care professional within the past year . Reports that he has not had a formal hearing evaluation within the past year *Unable to fully assess hearing and vision during telephone visit type  Cognitive Function: 6CIT Screen 09/20/2019 09/18/2018  What Year? 0 points 0 points  What month? 0 points 0 points  What time? 0 points 0 points  Count back from 20 0 points 0 points  Months in reverse 0 points 0 points  Repeat phrase 0 points 0 points  Total Score 0 0   (Normal:0-7, Significant for Dysfunction: >8)  Normal Cognitive Function Screening: Yes   Immunization & Health Maintenance Record Immunization History  Administered Date(s) Administered  . Fluad Quad(high Dose 65+) 12/02/2018  . Influenza, High Dose Seasonal PF 12/21/2015, 12/05/2016, 12/23/2017  . Influenza,inj,Quad PF,6+ Mos 12/03/2012, 12/30/2013, 01/03/2015  . Influenza,inj,quad, With Preservative 12/02/2016  . PFIZER SARS-COV-2 Vaccination 03/23/2019, 04/15/2019  . Pneumococcal Conjugate-13 01/12/2014  . Pneumococcal Polysaccharide-23 10/01/2012  . Tdap 07/03/2010, 01/17/2011  . Zoster Recombinat (Shingrix) 07/25/2016, 01/09/2017    Health Maintenance  Topic Date Due  . Hepatitis C Screening  Never done  . INFLUENZA VACCINE  10/03/2019  . TETANUS/TDAP  01/16/2021  . COVID-19 Vaccine  Completed  . PNA vac Low Risk Adult  Completed       Assessment  This is a routine wellness examination for [name] [name].  Health Maintenance: Due or  Overdue Health Maintenance Due  Topic Date Due  . Hepatitis C Screening  Never done    [name] [name] does not need a referral for Community Assistance: Care Management:   no Social Work:    no Prescription Assistance:  no Nutrition/Diabetes Education:  no   Plan:  Personalized Goals Goals Addressed            This Visit's Progress   . DIET - INCREASE WATER INTAKE       Try to drink 6-8 glasses of water daily      Personalized Health Maintenance & Screening Recommendations  Advanced directives: has an advanced directive - a copy HAS NOT been provided.  Lung Cancer Screening Recommended: no (Low Dose CT Chest recommended if Age 46-80 years, 30 pack-year currently smoking OR have quit w/in past 15 years) Hepatitis C Screening recommended: no HIV Screening recommended: no  Advanced Directives: Written information was not prepared per patient's request.  Referrals & Orders No orders of the defined types were placed in this encounter.   Follow-up Plan . Follow-up with [name], DO as planned . Keep all appointments with your Specialty doctors . Bring a copy of your Advanced Directives in for our records   I have personally reviewed and noted the following in the patient's chart:   . Medical and social history . Use of alcohol, tobacco or illicit drugs  . Current medications and supplements . Functional ability and status . Nutritional status . Physical activity . Advanced directives . List of other physicians . Hospitalizations,  surgeries, and ER visits in previous 12 months . Vitals . Screenings to include cognitive, depression, and falls . Referrals and appointments  In addition, I have reviewed and discussed with [name] [name] certain preventive protocols, quality metrics, and best practice recommendations. A written personalized care plan for preventive services as well as general preventive health recommendations is available and can be  mailed to the patient at his request.      Milas Hock, LPN  [phone]

## 2019-11-15 ENCOUNTER — Other Ambulatory Visit: Payer: Self-pay | Admitting: Cardiology

## 2019-12-20 ENCOUNTER — Telehealth: Payer: Self-pay | Admitting: Cardiology

## 2019-12-20 NOTE — Telephone Encounter (Signed)
Patient returning call.

## 2019-12-20 NOTE — Telephone Encounter (Signed)
Left a message for the pt to call the office back. 

## 2019-12-20 NOTE — Telephone Encounter (Signed)
Pt called in and wanted to run it by Dr Percival Spanish to see if he should still be taking the Asprin everyday.  He state with all info in the media about you should take a Asprin everyday if you have never had a heart attack, he just wanted to confirm that this is what he should still be doing ?

## 2019-12-20 NOTE — Telephone Encounter (Signed)
Left the pt a message to call the office back. 

## 2019-12-20 NOTE — Telephone Encounter (Signed)
Spoke to patient he has been hearing on the news he does not need to be taking a aspirin.Stated he is presently taking Aspirin 81 mg daily.Advised I will send message to Dr.Hochrein for advice.

## 2019-12-21 ENCOUNTER — Ambulatory Visit (INDEPENDENT_AMBULATORY_CARE_PROVIDER_SITE_OTHER): Payer: Medicare Other

## 2019-12-21 ENCOUNTER — Other Ambulatory Visit: Payer: Self-pay

## 2019-12-21 DIAGNOSIS — Z23 Encounter for immunization: Secondary | ICD-10-CM | POA: Diagnosis not present

## 2019-12-22 NOTE — Telephone Encounter (Signed)
Advised patient, verbalized understanding  

## 2019-12-22 NOTE — Telephone Encounter (Signed)
With his known CAD he should continue ASA.

## 2020-01-05 ENCOUNTER — Ambulatory Visit (INDEPENDENT_AMBULATORY_CARE_PROVIDER_SITE_OTHER): Payer: Medicare Other

## 2020-01-05 ENCOUNTER — Other Ambulatory Visit: Payer: Self-pay

## 2020-01-05 DIAGNOSIS — Z23 Encounter for immunization: Secondary | ICD-10-CM | POA: Diagnosis not present

## 2020-01-05 NOTE — Progress Notes (Signed)
   Covid-19 Vaccination Clinic  Name:  [name] [name]    MRN: [id number] DOB: [birth year]  01/05/2020  [name] [name] was observed post Covid-19 immunization for 15 minutes without incident. He was provided with Vaccine Information Sheet and instruction to access the V-Safe system.   [name] [name] was instructed to call 911 with any severe reactions post vaccine: Marland Kitchen Difficulty breathing  . Swelling of face and throat  . A fast heartbeat  . A bad rash all over body  . Dizziness and weakness

## 2020-02-01 ENCOUNTER — Telehealth: Payer: Self-pay

## 2020-02-01 NOTE — Telephone Encounter (Signed)
PA started for repatha [place]: [place]. [facility] is processing your PA request and will respond shortly with next steps. You may close this dialog, return to your dashboard, and perform other tasks. To check for an update later, open this request again from your dashboard.

## 2020-02-01 NOTE — Telephone Encounter (Signed)
Appointment scheduled on 02/08/20 at 9:30 am to discuss recertification for Repatha.

## 2020-02-01 NOTE — Telephone Encounter (Signed)
Please see for PA request I have not seen this patient Let me know if I can help

## 2020-02-02 ENCOUNTER — Encounter: Payer: Self-pay | Admitting: *Deleted

## 2020-02-02 NOTE — Telephone Encounter (Signed)
Opened in error

## 2020-02-02 NOTE — Telephone Encounter (Signed)
Message from [place] Not Required  Pharmacy notified

## 2020-02-03 ENCOUNTER — Other Ambulatory Visit: Payer: Self-pay | Admitting: Family Medicine

## 2020-02-08 ENCOUNTER — Ambulatory Visit (INDEPENDENT_AMBULATORY_CARE_PROVIDER_SITE_OTHER): Payer: Medicare Other | Admitting: Pharmacist

## 2020-02-08 ENCOUNTER — Other Ambulatory Visit: Payer: Self-pay | Admitting: Family Medicine

## 2020-02-08 ENCOUNTER — Other Ambulatory Visit: Payer: Self-pay

## 2020-02-08 DIAGNOSIS — E785 Hyperlipidemia, unspecified: Secondary | ICD-10-CM

## 2020-02-08 DIAGNOSIS — D696 Thrombocytopenia, unspecified: Secondary | ICD-10-CM | POA: Diagnosis not present

## 2020-02-08 DIAGNOSIS — N400 Enlarged prostate without lower urinary tract symptoms: Secondary | ICD-10-CM

## 2020-02-08 NOTE — Progress Notes (Signed)
02/08/2020 Name: [name] [name] MRN: [id number] DOB: [birth year]   S:  77 yoM Presents for hyperlipidemia education, and management. Patient is currently on Repatha and well controlled.  He had open heart surgery ~15 yrs ago and has not had an event since that time.  He sees Dr. Percival Spanish for cardiology.  PMH is significant for:  Past Medical History:  Diagnosis Date  . AAA (abdominal aortic aneurysm) ([place])    gets abd [place] every 3 years to watch,last check 08/2018  . Arthritis   . BPH (benign prostatic hyperplasia)   . CAD (coronary artery disease)   . Colon polyps   . Dyslipidemia   . GERD (gastroesophageal reflux disease)   . Heart murmur   . Herniated disc   . Hiatal hernia   . History of kidney stones   . HTN (hypertension)   . Hyperlipidemia   . OSA on CPAP   . Tinnitus    Current Medications: Repatha/ASA  Intolerances: statins, zetia  Risk Factors: PMH as above  LDL goal: < 70 given history as above   Diet: mindful of intake  Exercise: walks almost every day  Family History: n/a  Social History: non smoker    Labs:    Lipid Panel     Component Value Date/Time   CHOL 125 02/08/2020 0949   CHOL 160 05/04/2013 0822   TRIG 63 02/08/2020 0949   TRIG 59 11/28/2016 0836   TRIG 92 05/04/2013 0822   HDL 58 02/08/2020 0949   HDL 54 11/28/2016 0836   HDL 55 05/04/2013 0822   CHOLHDL 2.2 02/08/2020 0949   LDLCALC 54 02/08/2020 0949   LDLCALC 149 (H) 08/05/2013 0851   LDLCALC 87 05/04/2013 0822   LABVLDL 13 02/08/2020 0949   Current Outpatient Medications on File Prior to Visit  Medication Sig Dispense Refill  . aspirin EC 81 MG tablet Take 81 mg by mouth daily. Swallow whole.    Marland Kitchen acetaminophen (TYLENOL) 325 MG tablet Take 650 mg by mouth every 6 (six) hours as needed (for pain.).    Marland Kitchen Digestive Enzymes (ENZYME DIGEST PO) Take 1 capsule by mouth at bedtime.    . fluticasone (FLONASE) 50 MCG/ACT nasal spray Place 2 sprays into both nostrils daily as  needed for allergies or rhinitis.    . metoprolol tartrate (LOPRESSOR) 25 MG tablet TAKE 1 TABLET TWICE A DAY 180 tablet 0  . nitroGLYCERIN (NITROSTAT) 0.4 MG SL tablet Place 1 tablet (0.4 mg total) under the tongue every 5 (five) minutes as needed for chest pain. 25 tablet prn  . omeprazole (PRILOSEC) 20 MG capsule Take 20 mg by mouth daily.    . Probiotic Product (PROBIOTIC PO) Take 1 capsule by mouth daily.    Marland Kitchen REPATHA SURECLICK 211 MG/ML SOAJ INJECT 140MG  EVERY 2 WEEKS 2 mL 0  . triamcinolone cream (KENALOG) 0.1 % Apply 1 application topically 2 (two) times daily. x7-10 days 30 g 0   No current facility-administered medications on file prior to visit.     Allergies  Allergen Reactions  . Levofloxacin     Caused C- Diff  . Crestor [Rosuvastatin] Other (See Comments)    Liver problems  . Vytorin [Ezetimibe-Simvastatin] Other (See Comments)    Liver problems    Assessment/Plan:    1. Hyperlipidemia -  Currently paying $128 for Repatha; continue Repatha  Continue aspirin (was not on medlist--added)  Patient uses [facility]; instructed patient to call me when new prior authorization is  needed for January happy to complete  Discussed [place] for hyperlipidemia--grant currently closed, but when open can provide patient $2500 annually to assist with Repatha    Regina Eck, PharmD, BCPS Clinical Pharmacist, [place]  [facility] Phone [phone]

## 2020-02-09 LAB — CMP14+EGFR
ALT: 16 IU/L (ref 0–44)
AST: 21 IU/L (ref 0–40)
Albumin/Globulin Ratio: 2.3 — ABNORMAL HIGH (ref 1.2–2.2)
Albumin: 4.4 g/dL (ref 3.7–4.7)
Alkaline Phosphatase: 74 IU/L (ref 44–121)
BUN/Creatinine Ratio: 21 (ref 10–24)
BUN: 18 mg/dL (ref 8–27)
Bilirubin Total: 0.6 mg/dL (ref 0.0–1.2)
CO2: 22 mmol/L (ref 20–29)
Calcium: 9.2 mg/dL (ref 8.6–10.2)
Chloride: 105 mmol/L (ref 96–106)
Creatinine, Ser: 0.87 mg/dL (ref 0.76–1.27)
GFR calc Af Amer: 96 mL/min/{1.73_m2} (ref >59)
GFR calc non Af Amer: 83 mL/min/{1.73_m2} (ref >59)
Globulin, Total: 1.9 g/dL (ref 1.5–4.5)
Glucose: 98 mg/dL (ref 65–99)
Potassium: 4.4 mmol/L (ref 3.5–5.2)
Sodium: 140 mmol/L (ref 134–144)
Total Protein: 6.3 g/dL (ref 6.0–8.5)

## 2020-02-09 LAB — CBC
Hematocrit: 42.1 % (ref 37.5–51.0)
Hemoglobin: 14.2 g/dL (ref 13.0–17.7)
MCH: 31.6 pg (ref 26.6–33.0)
MCHC: 33.7 g/dL (ref 31.5–35.7)
MCV: 94 fL (ref 79–97)
Platelets: 143 10*3/uL — ABNORMAL LOW (ref 150–450)
RBC: 4.5 x10E6/uL (ref 4.14–5.80)
RDW: 12.4 % (ref 11.6–15.4)
WBC: 5 10*3/uL (ref 3.4–10.8)

## 2020-02-09 LAB — LIPID PANEL
Chol/HDL Ratio: 2.2 ratio (ref 0.0–5.0)
Cholesterol, Total: 125 mg/dL (ref 100–199)
HDL: 58 mg/dL (ref >39)
LDL Chol Calc (NIH): 54 mg/dL (ref 0–99)
Triglycerides: 63 mg/dL (ref 0–149)
VLDL Cholesterol Cal: 13 mg/dL (ref 5–40)

## 2020-02-09 LAB — PSA: Prostate Specific Ag, Serum: 2.2 ng/mL (ref 0.0–4.0)

## 2020-02-14 ENCOUNTER — Other Ambulatory Visit: Payer: Self-pay | Admitting: Cardiology

## 2020-02-15 ENCOUNTER — Ambulatory Visit (INDEPENDENT_AMBULATORY_CARE_PROVIDER_SITE_OTHER): Payer: Medicare Other | Admitting: Family Medicine

## 2020-02-15 ENCOUNTER — Other Ambulatory Visit: Payer: Self-pay

## 2020-02-15 ENCOUNTER — Encounter: Payer: Self-pay | Admitting: Family Medicine

## 2020-02-15 VITALS — BP 135/67 | HR 59 | Temp 97.0°F | Ht 72.0 in | Wt 214.0 lb

## 2020-02-15 DIAGNOSIS — G629 Polyneuropathy, unspecified: Secondary | ICD-10-CM

## 2020-02-15 DIAGNOSIS — Z8052 Family history of malignant neoplasm of bladder: Secondary | ICD-10-CM

## 2020-02-15 DIAGNOSIS — E785 Hyperlipidemia, unspecified: Secondary | ICD-10-CM

## 2020-02-15 DIAGNOSIS — R6889 Other general symptoms and signs: Secondary | ICD-10-CM | POA: Diagnosis not present

## 2020-02-15 DIAGNOSIS — H6123 Impacted cerumen, bilateral: Secondary | ICD-10-CM | POA: Diagnosis not present

## 2020-02-15 LAB — URINALYSIS, COMPLETE
Bilirubin, UA: NEGATIVE
Glucose, UA: NEGATIVE
Ketones, UA: NEGATIVE
Leukocytes,UA: NEGATIVE
Nitrite, UA: NEGATIVE
Protein,UA: NEGATIVE
Specific Gravity, UA: 1.01 (ref 1.005–1.030)
Urobilinogen, Ur: 0.2 mg/dL (ref 0.2–1.0)
pH, UA: 5 (ref 5.0–7.5)

## 2020-02-15 LAB — MICROSCOPIC EXAMINATION

## 2020-02-15 NOTE — Progress Notes (Signed)
Subjective: CC: Ear fullness, lower leg sensory change PCP: [name], DO CMK:LKJZPH [name] [name] is a 79 y.o. male presenting to clinic today for:  1.  Ear fullness Patient reports bilateral L fullness with decreased hearing.  Has had history of recurrent cerumen impaction would like to have these looked at today.  2.  Lower leg sensory change Patient reports that he has some change in the sensation mid shin to his feet.  He has history of degenerative back changes but surprisingly has not really had any back pain in the last 6 months.  He denies any overt numbness but feels that the sensation is not as sharp below the shin as above the shin.  No falls.  No known diabetes.  Last labs were within normal range.  Does not report any discoloration of the feet.  No swelling of the legs.  Has had no change in exercise tolerance   ROS: Per HPI  Allergies  Allergen Reactions   Levofloxacin     Caused C- Diff   Crestor [Rosuvastatin] Other (See Comments)    Liver problems   Vytorin [Ezetimibe-Simvastatin] Other (See Comments)    Liver problems   Past Medical History:  Diagnosis Date   AAA (abdominal aortic aneurysm) ([place])    gets abd [place] every 3 years to watch,last check 08/2018   Arthritis    BPH (benign prostatic hyperplasia)    CAD (coronary artery disease)    Colon polyps    Dyslipidemia    GERD (gastroesophageal reflux disease)    Heart murmur    Herniated disc    Hiatal hernia    History of kidney stones    HTN (hypertension)    Hyperlipidemia    OSA on CPAP    Tinnitus     Current Outpatient Medications:    aspirin EC 81 MG tablet, Take 81 mg by mouth daily. Swallow whole., Disp: , Rfl:    Digestive Enzymes (ENZYME DIGEST PO), Take 1 capsule by mouth at bedtime., Disp: , Rfl:    fluticasone (FLONASE) 50 MCG/ACT nasal spray, Place 2 sprays into both nostrils daily as needed for allergies or rhinitis., Disp: , Rfl:    metoprolol tartrate  (LOPRESSOR) 25 MG tablet, TAKE 1 TABLET TWICE A DAY, Disp: 180 tablet, Rfl: 0   nitroGLYCERIN (NITROSTAT) 0.4 MG SL tablet, Place 1 tablet (0.4 mg total) under the tongue every 5 (five) minutes as needed for chest pain., Disp: 25 tablet, Rfl: prn   omeprazole (PRILOSEC) 20 MG capsule, Take 20 mg by mouth daily., Disp: , Rfl:    Probiotic Product (PROBIOTIC PO), Take 1 capsule by mouth daily., Disp: , Rfl:    REPATHA SURECLICK 150 MG/ML SOAJ, INJECT 140MG  EVERY 2 WEEKS, Disp: 2 mL, Rfl: 0   triamcinolone cream (KENALOG) 0.1 %, Apply 1 application topically 2 (two) times daily. x7-10 days, Disp: 30 g, Rfl: 0   acetaminophen (TYLENOL) 325 MG tablet, Take 650 mg by mouth every 6 (six) hours as needed (for pain.)., Disp: , Rfl:  Social History   Socioeconomic History   Marital status: Married    Spouse name: Constance Holster   Number of children: 2   Years of education: Not on file   Highest education level: Bachelor's degree (e.g., BA, AB, BS)  Occupational History   Occupation: Armed forces technical officer for [employer]  Tobacco Use   Smoking status: Never Smoker   Smokeless tobacco: Never Used   Tobacco comment: does not smoke   Vaping  Use   Vaping Use: Never used  Substance and Sexual Activity   Alcohol use: Yes    Alcohol/week: 1.0 standard drink    Types: 1 Glasses of wine per week    Comment: with meal   Drug use: No   Sexual activity: Yes  Other Topics Concern   Not on file  Social History Narrative   Married 37+ years, 2 adult children and one granddaughter. Engineer, building services at [employer].    Social Determinants of Health   Financial Resource Strain: Low Risk    Difficulty of Paying Living Expenses: Not hard at all  Food Insecurity: No Food Insecurity   Worried About Charity fundraiser in the Last Year: Never true   [place] in the Last Year: Never true  Transportation Needs: No Transportation Needs   Lack of Transportation (Medical): No   Lack  of Transportation (Non-Medical): No  Physical Activity: Sufficiently Active   Days of Exercise per Week: 5 days   Minutes of Exercise per Session: 50 min  Stress: No Stress Concern Present   Feeling of Stress : Not at all  Social Connections: Socially Integrated   Frequency of Communication with Friends and Family: More than three times a week   Frequency of Social Gatherings with Friends and Family: More than three times a week   Attends Religious Services: More than 4 times per year   Active Member of [employer] or Organizations: Yes   Attends Music therapist: More than 4 times per year   Marital Status: Married  Human resources officer Violence: Not At Risk   Fear of Current or Ex-Partner: No   Emotionally Abused: No   Physically Abused: No   Sexually Abused: No   Family History  Problem Relation Age of Onset   Heart disease Father    Cancer Father        Bladder Cancer   Heart attack Father    Colon polyps Father    Hypertension Mother    Alzheimer's disease Mother    Stroke Mother        3-4    Diabetes Brother    Heart disease Brother    Arthritis Brother        bilateral knee replacement, back issues    Pneumonia Sister    GI problems Sister        esophogus stretched    Crohn's disease Son     Objective: Office vital signs reviewed. BP 135/67    Pulse (!) 59    Temp (!) 97 F (36.1 C) (Temporal)    Ht 6' (1.829 m)    Wt 214 lb (97.1 kg)    SpO2 99%    BMI 29.02 kg/m   Physical Examination:  General: Awake, alert, well nourished, No acute distress HEENT: Normal, sclera white, MMM Cardio: regular rate and rhythm, S1S2 heard, no murmurs appreciated Pulm: clear to auscultation bilaterally, no wheezes, rhonchi or rales; normal work of breathing on room air Extremities: warm, well perfused, No edema, cyanosis or clubbing; +2 pulses bilaterally MSK: normal gait and station Skin: dry; intact; no rashes or lesions Neuro: Decreased  vibratory sensation to the great toes bilaterally.  He has in tact vibratory sensation of the hands.  Light touch and station appears to be grossly intact   Assessment/ Plan: 79 y.o. male   Neuropathy - Plan: TSH, Vitamin B12  Family history of bladder cancer - Plan: Urinalysis, Complete  Bilateral impacted cerumen  Hyperlipidemia LDL goal <70 - Plan: TSH  TSH, vitamin B12 ordered.  His TSH has been normal in the past.  However, I am surprised that he has decreased vibratory sensation so we will check B12.  We discussed possible differentials including B12 deficiency and/or degenerative back changes causing neuropathic changes.  Urinalysis was obtained given family history of bladder cancer and ongoing positive trace urine dips for hemoglobin in this patient.  Under microscopy however it was noted 0 to 2 red blood cells.  He will schedule follow-up visit with Dr. Jeffie Pollock.  Cerumen impaction was resolved with irrigation bilaterally  Labs were reviewed and a copy was provided to the patient today  Orders Placed This Encounter  Procedures   Urinalysis, Complete   No orders of the defined types were placed in this encounter.    [name], DO [place] [phone]

## 2020-02-15 NOTE — Patient Instructions (Signed)
Testing B12 and Thyroid (normal in February)   Peripheral Neuropathy Peripheral neuropathy is a type of nerve damage. It affects nerves that carry signals between the spinal cord and the arms, legs, and the rest of the body (peripheral nerves). It does not affect nerves in the spinal cord or brain. In peripheral neuropathy, one nerve or a group of nerves may be damaged. Peripheral neuropathy is a broad category that includes many specific nerve disorders, like diabetic neuropathy, hereditary neuropathy, and carpal tunnel syndrome. What are the causes? This condition may be caused by:  Diabetes. This is the most common cause of peripheral neuropathy.  Nerve injury.  Pressure or stress on a nerve that lasts a long time.  Lack (deficiency) of B vitamins. This can result from alcoholism, poor diet, or a restricted diet.  Infections.  Autoimmune diseases, such as rheumatoid arthritis and systemic lupus erythematosus.  Nerve diseases that are passed from parent to child (inherited).  Some medicines, such as cancer medicines (chemotherapy).  Poisonous (toxic) substances, such as lead and mercury.  Too little blood flowing to the legs.  Kidney disease.  Thyroid disease. In some cases, the cause of this condition is not known. What are the signs or symptoms? Symptoms of this condition depend on which of your nerves is damaged. Common symptoms include:  Loss of feeling (numbness) in the feet, hands, or both.  Tingling in the feet, hands, or both.  Burning pain.  Very sensitive skin.  Weakness.  Not being able to move a part of the body (paralysis).  Muscle twitching.  Clumsiness or poor coordination.  Loss of balance.  Not being able to control your bladder.  Feeling dizzy.  Sexual problems. How is this diagnosed? Diagnosing and finding the cause of peripheral neuropathy can be difficult. Your health care provider will take your medical history and do a physical exam.  A neurological exam will also be done. This involves checking things that are affected by your brain, spinal cord, and nerves (nervous system). For example, your health care provider will check your reflexes, how you move, and what you can feel. You may have other tests, such as:  Blood tests.  Electromyogram (EMG) and nerve conduction tests. These tests check nerve function and how well the nerves are controlling the muscles.  Imaging tests, such as CT scans or MRI to rule out other causes of your symptoms.  Removing a small piece of nerve to be examined in a lab (nerve biopsy). This is rare.  Removing and examining a small amount of the fluid that surrounds the brain and spinal cord (lumbar puncture). This is rare. How is this treated? Treatment for this condition may involve:  Treating the underlying cause of the neuropathy, such as diabetes, kidney disease, or vitamin deficiencies.  Stopping medicines that can cause neuropathy, such as chemotherapy.  Medicine to relieve pain. Medicines may include: ? Prescription or over-the-counter pain medicine. ? Antiseizure medicine. ? Antidepressants. ? Pain-relieving patches that are applied to painful areas of skin.  Surgery to relieve pressure on a nerve or to destroy a nerve that is causing pain.  Physical therapy to help improve movement and balance.  Devices to help you move around (assistive devices). Follow these instructions at home: Medicines  Take over-the-counter and prescription medicines only as told by your health care provider. Do not take any other medicines without first asking your health care provider.  Do not drive or use heavy machinery while taking prescription pain medicine. Lifestyle  Do not use any products that contain nicotine or tobacco, such as cigarettes and e-cigarettes. Smoking keeps blood from reaching damaged nerves. If you need help quitting, ask your health care provider.  Avoid or limit alcohol.  Too much alcohol can cause a vitamin B deficiency, and vitamin B is needed for healthy nerves.  Eat a healthy diet. This includes: ? Eating foods that are high in fiber, such as fresh fruits and vegetables, whole grains, and beans. ? Limiting foods that are high in fat and processed sugars, such as fried or sweet foods. General instructions   If you have diabetes, work closely with your health care provider to keep your blood sugar under control.  If you have numbness in your feet: ? Check every day for signs of injury or infection. Watch for redness, warmth, and swelling. ? Wear padded socks and comfortable shoes. These help protect your feet.  Develop a good support system. Living with peripheral neuropathy can be stressful. Consider talking with a mental health specialist or joining a support group.  Use assistive devices and attend physical therapy as told by your health care provider. This may include using a walker or a cane.  Keep all follow-up visits as told by your health care provider. This is important. Contact a health care provider if:  You have new signs or symptoms of peripheral neuropathy.  You are struggling emotionally from dealing with peripheral neuropathy.  Your pain is not well-controlled. Get help right away if:  You have an injury or infection that is not healing normally.  You develop new weakness in an arm or leg.  You fall frequently. Summary  Peripheral neuropathy is when the nerves in the arms, or legs are damaged, resulting in numbness, weakness, or pain.  There are many causes of peripheral neuropathy, including diabetes, pinched nerves, vitamin deficiencies, autoimmune disease, and hereditary conditions.  Diagnosing and finding the cause of peripheral neuropathy can be difficult. Your health care provider will take your medical history, do a physical exam, and do tests, including blood tests and nerve function tests.  Treatment involves  treating the underlying cause of the neuropathy and taking medicines to help control pain. Physical therapy and assistive devices may also help. This information is not intended to replace advice given to you by your health care provider. Make sure you discuss any questions you have with your health care provider. Document Revised: 01/31/2017 Document Reviewed: 04/29/2016 Elsevier Patient Education  2020 [employer].

## 2020-02-16 LAB — TSH: TSH: 2.18 u[IU]/mL (ref 0.450–4.500)

## 2020-02-16 LAB — VITAMIN B12: Vitamin B-12: 1308 pg/mL — ABNORMAL HIGH (ref 232–1245)

## 2020-02-16 NOTE — Telephone Encounter (Signed)
Rx has been sent to the pharmacy electronically. ° °

## 2020-03-09 ENCOUNTER — Telehealth: Payer: Self-pay

## 2020-03-13 MED ORDER — REPATHA SURECLICK 140 MG/ML ~~LOC~~ SOAJ: SUBCUTANEOUS | 11 refills | Status: DC

## 2020-03-13 NOTE — Telephone Encounter (Signed)
New rx for repatha sent in--awaiting PA Please let me know, I'm happy to complete

## 2020-03-29 DIAGNOSIS — Z951 Presence of aortocoronary bypass graft: Secondary | ICD-10-CM | POA: Insufficient documentation

## 2020-03-29 DIAGNOSIS — R011 Cardiac murmur, unspecified: Secondary | ICD-10-CM | POA: Insufficient documentation

## 2020-03-29 NOTE — Progress Notes (Signed)
Cardiology Office Note   Date:  03/30/2020   ID:  Yasuo, Phimmasone [birth year], MRN [id number]  PCP:  [name], DO  Cardiologist:   Minus Breeding, MD   Chief Complaint  Patient presents with  . Heart Murmur      History of Present Illness: [name] [name] is a 80 y.o. male who presents for evaluation of CAD/CABG. I sent him for an echo after the last appt and his EF was normal.  I last saw him he has done okay.  He has been golfing.  With this he denies any cardiovascular symptoms.  He probably walks about 2 to 3 miles per day particularly when he is golfing.  He also walks for exercise.  He goes up an incline but previously had caused the symptoms alerting [place] to his condition.  However, he no longer gets this.  He denies any chest pressure, neck or arm discomfort.  He has not had any palpitations, presyncope or syncope.  He has had no PND or orthopnea.   Past Medical History:  Diagnosis Date  . AAA (abdominal aortic aneurysm) ([place])    gets abd [place] every 3 years to watch,last check 08/2018  . Arthritis   . BPH (benign prostatic hyperplasia)   . CAD (coronary artery disease)   . Colon polyps   . Dyslipidemia   . GERD (gastroesophageal reflux disease)   . Heart murmur   . Herniated disc   . Hiatal hernia   . History of kidney stones   . HTN (hypertension)   . Hyperlipidemia   . OSA on CPAP   . Tinnitus     Past Surgical History:  Procedure Laterality Date  . COLONOSCOPY    . CORONARY ARTERY BYPASS GRAFT  2006   LIMA to LAD, SVG to RCA, SVG to circumflex, SVG to diagonal   . mole removed  2003   stomach  . right knee surgery  2008  . TONSILLECTOMY    . TOTAL KNEE ARTHROPLASTY Right 04/27/2018   Procedure: TOTAL KNEE ARTHROPLASTY RIGHT;  Surgeon: Gaynelle Arabian, MD;  Location: [facility];  Service: Orthopedics;  Laterality: Right;  56min  . TOTAL KNEE ARTHROPLASTY Left 05/31/2019   Procedure: TOTAL KNEE ARTHROPLASTY;  Surgeon: Gaynelle Arabian, MD;   Location: [facility];  Service: Orthopedics;  Laterality: Left;  15min  . UPPER GI ENDOSCOPY       Current Outpatient Medications  Medication Sig Dispense Refill  . aspirin EC 81 MG tablet Take 81 mg by mouth daily. Swallow whole.    . Digestive Enzymes (ENZYME DIGEST PO) Take 1 capsule by mouth at bedtime.    . Evolocumab (REPATHA SURECLICK) 834 MG/ML SOAJ INJECT 140MG  EVERY 2 WEEKS 2 mL 11  . fluticasone (FLONASE) 50 MCG/ACT nasal spray Place 2 sprays into both nostrils daily as needed for allergies or rhinitis.    . metoprolol tartrate (LOPRESSOR) 25 MG tablet TAKE 1 TABLET TWICE A DAY 180 tablet 0  . nitroGLYCERIN (NITROSTAT) 0.4 MG SL tablet Place 1 tablet (0.4 mg total) under the tongue every 5 (five) minutes as needed for chest pain. 25 tablet prn  . omeprazole (PRILOSEC) 20 MG capsule Take 20 mg by mouth daily.    . Probiotic Product (PROBIOTIC PO) Take 1 capsule by mouth daily.    Marland Kitchen triamcinolone cream (KENALOG) 0.1 % Apply 1 application topically 2 (two) times daily. x7-10 days 30 g 0   No current facility-administered medications for this visit.  Allergies:   Levofloxacin, Crestor [rosuvastatin], and Vytorin [ezetimibe-simvastatin]    ROS:  Please see the history of present illness.   Otherwise, review of systems are positive for numbness on his right pretibial area.   All other systems are reviewed and negative.    PHYSICAL EXAM: VS:  BP 136/74 (BP Location: Left Arm, Patient Position: Sitting, Cuff Size: Normal)   Pulse 60   Ht 6' (1.829 m)   Wt 216 lb (98 kg)   BMI 29.29 kg/m  , BMI Body mass index is 29.29 kg/m. GENERAL:  Well appearing NECK:  No jugular venous distention, waveform within normal limits, carotid upstroke brisk and symmetric, no bruits, no thyromegaly LUNGS:  Clear to auscultation bilaterally CHEST:  Unremarkable HEART:  PMI not displaced or sustained,S1 and S2 within normal limits, no S3, no S4, no clicks, no rubs, 3 out of 6 holosystolic murmur  heard best at the left mid sternal border, no diastolic murmurs ABD:  Flat, positive bowel sounds normal in frequency in pitch, no bruits, no rebound, no guarding, no midline pulsatile mass, no hepatomegaly, no splenomegaly EXT:  2 plus pulses throughout, trace pretibial bilateral edema, no cyanosis no clubbing   EKG:  EKG is  ordered today. The ekg ordered today demonstrates sinus rhythm, rate 60, axis within normal limits, intervals within normal limits, no acute ST-T wave changes.   Recent Labs: 02/08/2020: ALT 16; BUN 18; Creatinine, Ser 0.87; Hemoglobin 14.2; Platelets 143; Potassium 4.4; Sodium 140 02/15/2020: TSH 2.180    Lipid Panel    Component Value Date/Time   CHOL 125 02/08/2020 0949   CHOL 160 05/04/2013 0822   TRIG 63 02/08/2020 0949   TRIG 59 11/28/2016 0836   TRIG 92 05/04/2013 0822   HDL 58 02/08/2020 0949   HDL 54 11/28/2016 0836   HDL 55 05/04/2013 0822   CHOLHDL 2.2 02/08/2020 0949   LDLCALC 54 02/08/2020 0949   LDLCALC 149 (H) 08/05/2013 0851   LDLCALC 87 05/04/2013 0822      Wt Readings from Last 3 Encounters:  03/30/20 216 lb (98 kg)  02/15/20 214 lb (97.1 kg)  08/17/19 212 lb (96.2 kg)      Other studies Reviewed: Additional studies/ records that were reviewed today include:  Labs. Review of the above records demonstrates:  Please see elsewhere in the note.     ASSESSMENT AND PLAN:  AAA:  3.31cm in June of last year.    I will check this again in 2023.  CAD:    He has had no symptoms since his stress test in 2019.  No change in therapy.  HTN:  The blood pressure is at target.  No change in therapy.   DYSLIPIDEMIA:   His LDL most recently was 54 with an HDL of 58.   MURMUR:  He has tricuspid regurgitation.  Murmur sounds more prominent.  I will follow up with an echocardiogram.    Current medicines are reviewed at length with the patient today.  The patient does not have concerns regarding medicines.  The following changes have been  made:  None  Labs/ tests ordered today include:    Orders Placed This Encounter  Procedures  . EKG 12-Lead  . ECHOCARDIOGRAM COMPLETE     Disposition:   FU with me in 12 months.     Signed, Minus Breeding, MD  03/30/2020 12:43 PM    [facility]

## 2020-03-30 ENCOUNTER — Ambulatory Visit (INDEPENDENT_AMBULATORY_CARE_PROVIDER_SITE_OTHER): Payer: Medicare Other | Admitting: Cardiology

## 2020-03-30 ENCOUNTER — Other Ambulatory Visit: Payer: Self-pay

## 2020-03-30 ENCOUNTER — Encounter: Payer: Self-pay | Admitting: Cardiology

## 2020-03-30 VITALS — BP 136/74 | HR 60 | Ht 72.0 in | Wt 216.0 lb

## 2020-03-30 DIAGNOSIS — R011 Cardiac murmur, unspecified: Secondary | ICD-10-CM

## 2020-03-30 DIAGNOSIS — Z951 Presence of aortocoronary bypass graft: Secondary | ICD-10-CM

## 2020-03-30 DIAGNOSIS — I714 Abdominal aortic aneurysm, without rupture, unspecified: Secondary | ICD-10-CM

## 2020-03-30 DIAGNOSIS — I071 Rheumatic tricuspid insufficiency: Secondary | ICD-10-CM

## 2020-03-30 DIAGNOSIS — E785 Hyperlipidemia, unspecified: Secondary | ICD-10-CM | POA: Diagnosis not present

## 2020-03-30 NOTE — Patient Instructions (Addendum)
Medication Instructions:  Your physician recommends that you continue on your current medications as directed. Please refer to the Current Medication list given to you today.   *If you need a refill on your cardiac medications before your next appointment, please call your pharmacy  Lab Work: NONE   Testing/Procedures: Your physician has requested that you have an echocardiogram. Echocardiography is a painless test that uses sound waves to create images of your heart. It provides your doctor with information about the size and shape of your heart and how well your heart's chambers and valves are working. This procedure takes approximately one hour. There are no restrictions for this procedure. [facility] [place] [address]  THE OFFICE WILL CALL YOU TO ARRANGE YOUR ULTRASOUND IN 2023   Follow-Up: At [facility], you and your health needs are our priority.  As part of our continuing mission to provide you with exceptional heart care, we have created designated Provider Care Teams.  These Care Teams include your primary Cardiologist (physician) and Advanced Practice Providers (APPs -  Physician Assistants and Nurse Practitioners) who all work together to provide you with the care you need, when you need it.  We recommend signing up for the patient portal called "MyChart".  Sign up information is provided on this After Visit Summary.  MyChart is used to connect with patients for Virtual Visits (Telemedicine).  Patients are able to view lab/test results, encounter notes, upcoming appointments, etc.  Non-urgent messages can be sent to your provider as well.   To learn more about what you can do with MyChart, go to [email or web address].    Your next appointment:   12 month(s)  You will receive a reminder letter in the mail two months in advance. If you don't receive a letter, please call our office to schedule the follow-up appointment.   The format for your next appointment:   In  Person  Provider:   You may see Minus Breeding, MD  or one of the following Advanced Practice Providers on your designated Care Team:    Rosaria Ferries, PA-C  Jory Sims, DNP, ANP

## 2020-04-05 NOTE — Progress Notes (Signed)
HPI male never smoker followed for OSA, complicated by CAD/CABG, HBP, hiatal hernia Unattended Home Sleep Test 05/09/14- AHI 24.9/ hr, desat to 85%, body weight 208 pounds Unattended Home Sleep Test 08/02/15-AHI 11.3/hour, desaturation to 76%, body weight 212.5 pounds -----------------------------------------------------------   04/06/19- 80 year old male never smoker followed for OSA, complicated by CAD/CABG, HBP, hiatal hernia, BPH,  CPAP auto 5-12/[employer] Download compliance 100%, AHI 1.5/ hr Recent ov note with cardiology reviewed- stable with nl EF and no changes in Rx. Had first Covid vax. -----f/u OSA. Patient stated his breathing is at his baseline.  Body weight today 217 lbs Tolerated R TKR without respiratory problems and now feels medically stable.  04/06/20-  80 year old male never smoker followed for OSA, Insomniacomplicated by CAD/CABG, HBP, hiatal hernia, BPH, CPAP auto 5-12/[employer] Download- compliance 97%, AHI 0.9/ hr Body weight today- 212 lbs Covid vax- 3 Phizer Flu vax-had ------Patient states that machine is working good, has not been sleeping good lately but family pet has cancer so wakes them up. He does well with CPAP. Hde has gotten into a pattern of waking with busy brain life-issues around 4 AM. Plays a simple computer game for awhile then often can go back to sleep.  Trying melatonin 3 mg.  CXR 08/17/19- IMPRESSION: 1.  Prior CABG.  Heart size stable. 2. Stable calcified nodule left lower lung, most likely a granuloma. No acute pulmonary abnormality identified.  ROS-see HPI     + = positive Constitutional:   No-   weight loss, night sweats, fevers, chills, fatigue, lassitude. HEENT:   No-  headaches, difficulty swallowing, tooth/dental problems, sore throat,       No-  sneezing, itching, ear ache, nasal congestion, post nasal drip,  CV:  No-   chest pain, orthopnea, PND, swelling in lower extremities, anasarca,                                                     dizziness, palpitations Resp: No-   shortness of breath with exertion or at rest.              No-   productive cough,  No non-productive cough,  No- coughing up of blood.              No-   change in color of mucus.  No- wheezing.   Skin: No-   rash or lesions. GI:  No-   heartburn, indigestion, abdominal pain, nausea, vomiting, diarrhea,                 change in bowel habits, loss of appetite GU: No-   dysuria, change in color of urine, no urgency or frequency.  No- flank pain. MS:  + joint pain or swelling.  No- decreased range of motion.  No- back pain. Neuro-     nothing unusual Psych:  No- change in mood or affect. No depression or anxiety.  No memory loss.  OBJ- Physical Exam   + = positive General- Alert, Oriented, Affect-appropriate, Distress- none acute, tall, not obese Skin- rash-none, lesions- none, excoriation- none Lymphadenopathy- none Head- atraumatic            Eyes- Gross vision intact, PERRLA, conjunctivae and secretions clear            Ears- Hearing, canals-normal  Nose- Clear, no-Septal dev, mucus, polyps, erosion, perforation             Throat- Mallampati II-III , mucosa clear , drainage- none, tonsils- atrophic Neck- flexible , trachea midline, no stridor , thyroid nl, carotid no bruit Chest - symmetrical excursion , unlabored           Heart/CV- RRR ,  Murmur+ TR , no gallop  , no rub, nl s1 s2                           - JVD- none , edema- none, stasis changes- none, varices- none           Lung- clear to P&A, wheeze- none, cough- none , dullness-none, rub- none           Chest wall-  Abd- tender-no, distended-no, bowel sounds-present, HSM- no Br/ Gen/ Rectal- Not done, not indicated Extrem- cyanosis- none, clubbing, none, atrophy- none, strength- nl, + brace on L knee Neuro- grossly intact to observation.

## 2020-04-06 ENCOUNTER — Ambulatory Visit (INDEPENDENT_AMBULATORY_CARE_PROVIDER_SITE_OTHER): Payer: Medicare Other | Admitting: Internal Medicine

## 2020-04-06 ENCOUNTER — Encounter: Payer: Self-pay | Admitting: Internal Medicine

## 2020-04-06 ENCOUNTER — Other Ambulatory Visit: Payer: Self-pay

## 2020-04-06 DIAGNOSIS — F5101 Primary insomnia: Secondary | ICD-10-CM | POA: Diagnosis not present

## 2020-04-06 DIAGNOSIS — G4700 Insomnia, unspecified: Secondary | ICD-10-CM | POA: Insufficient documentation

## 2020-04-06 DIAGNOSIS — G4733 Obstructive sleep apnea (adult) (pediatric): Secondary | ICD-10-CM | POA: Diagnosis not present

## 2020-04-06 NOTE — Assessment & Plan Note (Signed)
Hopefully shsort-term Plan- he will let me know if he needs help with this

## 2020-04-06 NOTE — Patient Instructions (Signed)
We can continue CPAP auto 5-12  You can ask [employer] when your machine will be eligible for replacement.  Please call if we can help

## 2020-04-06 NOTE — Assessment & Plan Note (Signed)
Benefits from CPAP. Discussed potential eligibility for replacement of old machine. Plan- continue auto 5-12. Replace when due.

## 2020-04-10 DIAGNOSIS — N401 Enlarged prostate with lower urinary tract symptoms: Secondary | ICD-10-CM | POA: Diagnosis not present

## 2020-04-10 DIAGNOSIS — R311 Benign essential microscopic hematuria: Secondary | ICD-10-CM | POA: Diagnosis not present

## 2020-04-10 DIAGNOSIS — R3915 Urgency of urination: Secondary | ICD-10-CM | POA: Diagnosis not present

## 2020-04-10 DIAGNOSIS — N5201 Erectile dysfunction due to arterial insufficiency: Secondary | ICD-10-CM | POA: Diagnosis not present

## 2020-04-10 DIAGNOSIS — Z87442 Personal history of urinary calculi: Secondary | ICD-10-CM | POA: Diagnosis not present

## 2020-04-25 ENCOUNTER — Ambulatory Visit (HOSPITAL_COMMUNITY): Payer: Medicare Other | Attending: Internal Medicine

## 2020-04-25 ENCOUNTER — Other Ambulatory Visit: Payer: Self-pay

## 2020-04-25 DIAGNOSIS — I071 Rheumatic tricuspid insufficiency: Secondary | ICD-10-CM | POA: Diagnosis not present

## 2020-04-25 DIAGNOSIS — R011 Cardiac murmur, unspecified: Secondary | ICD-10-CM | POA: Diagnosis not present

## 2020-04-25 LAB — ECHOCARDIOGRAM COMPLETE
Area-P 1/2: 3.21 cm2
P 1/2 time: 537 msec
S' Lateral: 3.2 cm

## 2020-05-05 ENCOUNTER — Ambulatory Visit (INDEPENDENT_AMBULATORY_CARE_PROVIDER_SITE_OTHER): Payer: Medicare Other | Admitting: Family Medicine

## 2020-05-05 ENCOUNTER — Encounter: Payer: Self-pay | Admitting: Family Medicine

## 2020-05-05 DIAGNOSIS — J069 Acute upper respiratory infection, unspecified: Secondary | ICD-10-CM | POA: Diagnosis not present

## 2020-05-05 NOTE — Progress Notes (Signed)
Subjective:    Patient ID: [name] [name], male    DOB: Nov 28, 1940, 80 y.o.   MRN: [id number]   HPI: [name] [name] is a 80 y.o. male presenting for sore thoat, runny nose. Temp 98.6. A little diarrhea. Pt. Denies dyspnea. Cough is occasional. Onset yesterday. Sense of taste and smell are good. Appetite is normal. One loose BM at 3 AM today. Wants test for CoVID. Tried a home test and he couldn't get it to read right.   Depression screen [facility] 2/9 02/15/2020 09/20/2019 04/13/2019 12/02/2018 09/18/2018  Decreased Interest 0 0 0 0 0  Down, Depressed, Hopeless 0 0 0 0 0  PHQ - 2 Score 0 0 0 0 0  Altered sleeping 0 - 0 0 -  Tired, decreased energy 0 - 0 0 -  Change in appetite 0 - 0 0 -  Feeling bad or failure about yourself  0 - 0 0 -  Trouble concentrating 0 - 0 0 -  Moving slowly or fidgety/restless 0 - 0 0 -  Suicidal thoughts 0 - 0 0 -  PHQ-9 Score 0 - 0 0 -  Difficult doing work/chores - - - - -  Some recent data might be hidden     Relevant past medical, surgical, family and social history reviewed and updated as indicated.  Interim medical history since our last visit reviewed. Allergies and medications reviewed and updated.  ROS:  Review of Systems  Constitutional: Positive for fatigue. Negative for chills and fever.  HENT: Positive for rhinorrhea and sore throat. Negative for congestion.   Respiratory: Positive for cough (a little, nonproductive). Negative for shortness of breath.   Skin: Negative for color change and rash.     Social History   Tobacco Use  Smoking Status Never Smoker  Smokeless Tobacco Never Used  Tobacco Comment   does not smoke        Objective:     Wt Readings from Last 3 Encounters:  04/06/20 212 lb 8 oz (96.4 kg)  03/30/20 216 lb (98 kg)  02/15/20 214 lb (97.1 kg)     Exam deferred. Pt. Harboring due to COVID 19. Phone visit performed.   Assessment & Plan:   1. Viral upper respiratory tract infection     No orders of  the defined types were placed in this encounter.   Orders Placed This Encounter  Procedures  . Novel Coronavirus, NAA ([facility])    Order Specific Question:   Is this test for diagnosis or screening    Answer:   Diagnosis of ill patient    Order Specific Question:   Symptomatic for COVID-19 as defined by CDC    Answer:   Yes    Order Specific Question:   Date of Symptom Onset    Answer:   05/04/2020    Order Specific Question:   Hospitalized for COVID-19    Answer:   No    Order Specific Question:   Admitted to ICU for COVID-19    Answer:   No    Order Specific Question:   Previously tested for COVID-19    Answer:   Yes    Order Specific Question:   Resident in a congregate (group) care setting    Answer:   No    Order Specific Question:   Is the patient student?    Answer:   No    Order Specific Question:   Employed in healthcare setting    Answer:  No    Order Specific Question:   Has patient completed COVID vaccination(s) (2 doses of Pfizer/Moderna 1 dose of [employer])    Answer:   Unknown    Order Specific Question:   Release to patient    Answer:   Immediate      Diagnoses and all orders for this visit:  Viral upper respiratory tract infection -     Novel Coronavirus, NAA ([facility])    Virtual Visit via telephone Note  I discussed the limitations, risks, security and privacy concerns of performing an evaluation and management service by telephone and the availability of in person appointments. The patient was identified with two identifiers. Pt.expressed understanding and agreed to proceed. Pt. Is at home. Dr. Livia Snellen is in his office.  Follow Up Instructions:   I discussed the assessment and treatment plan with the patient. The patient was provided an opportunity to ask questions and all were answered. The patient agreed with the plan and demonstrated an understanding of the instructions.   The patient was advised to call back or seek an in-person evaluation if  the symptoms worsen or if the condition fails to improve as anticipated.   Total minutes including chart review and phone contact time: 13   Follow up plan: Return if symptoms worsen or fail to improve.  Claretta Fraise, MD [place]

## 2020-05-06 LAB — SARS-COV-2, NAA 2 DAY TAT

## 2020-05-06 LAB — NOVEL CORONAVIRUS, NAA: SARS-CoV-2, NAA: NOT DETECTED

## 2020-05-08 ENCOUNTER — Other Ambulatory Visit: Payer: Self-pay | Admitting: Family Medicine

## 2020-05-08 ENCOUNTER — Telehealth: Payer: Self-pay

## 2020-05-08 MED ORDER — DIPHENOXYLATE-ATROPINE 2.5-0.025 MG PO TABS: 2.0000 | ORAL_TABLET | Freq: Four times a day (QID) | ORAL | 0 refills | Status: DC | PRN

## 2020-05-08 NOTE — Telephone Encounter (Signed)
I sent in a diarrhea med  for him. If he isn't better in the next day or two he should have an in-person visit as a follow up

## 2020-05-08 NOTE — Telephone Encounter (Signed)
Patient aware and verbalized understanding. °

## 2020-05-11 ENCOUNTER — Telehealth: Payer: Self-pay

## 2020-05-11 DIAGNOSIS — Z96653 Presence of artificial knee joint, bilateral: Secondary | ICD-10-CM | POA: Diagnosis not present

## 2020-05-11 NOTE — Telephone Encounter (Signed)
Pt called to see if Dr Livia Snellen had seen his message. Reviewed Dr Livia Snellen note with patient. Pt voiced understanding. Will call back if he has anymore issues.

## 2020-05-11 NOTE — Telephone Encounter (Signed)
Discontinue  the medication for diarrhea and increase intake of water and gatorade

## 2020-05-11 NOTE — Telephone Encounter (Signed)
Pt called stating that he recently had a televisit with Dr Livia Snellen and was prescribed some medicine to take for diarrhea. He says that the medicine has helped and he is not having anymore diarrhea but says the medicine makes him swimmy headed and decreases his pulse between 55-60..  Wants to know what Dr Livia Snellen wants him to do?

## 2020-05-15 ENCOUNTER — Encounter: Payer: Self-pay | Admitting: Nurse Practitioner

## 2020-05-15 ENCOUNTER — Telehealth (INDEPENDENT_AMBULATORY_CARE_PROVIDER_SITE_OTHER): Payer: Medicare Other | Admitting: Nurse Practitioner

## 2020-05-15 ENCOUNTER — Other Ambulatory Visit: Payer: Self-pay | Admitting: Cardiology

## 2020-05-15 DIAGNOSIS — R197 Diarrhea, unspecified: Secondary | ICD-10-CM | POA: Insufficient documentation

## 2020-05-15 MED ORDER — DIPHENOXYLATE-ATROPINE 2.5-0.025 MG PO TABS: 2.0000 | ORAL_TABLET | Freq: Four times a day (QID) | ORAL | 0 refills | Status: DC | PRN

## 2020-05-15 NOTE — Progress Notes (Signed)
   Virtual Visit via telephone Note Due to COVID-19 pandemic this visit was conducted virtually. This visit type was conducted due to national recommendations for restrictions regarding the COVID-19 Pandemic (e.g. social distancing, sheltering in place) in an effort to limit this patient's exposure and mitigate transmission in our community. All issues noted in this document were discussed and addressed.  A physical exam was not performed with this format.  I connected with [name] [name] on 05/16/20 at  1:20 PM by telephone and verified that I am speaking with the correct person using two identifiers. [name] [name] is currently located at home during visit. The provider, Ivy Lynn, NP is located in their office at time of visit.  I discussed the limitations, risks, security and privacy concerns of performing an evaluation and management service by telephone and the availability of in person appointments. I also discussed with the patient that there may be a patient responsible charge related to this service. The patient expressed understanding and agreed to proceed.   History and Present Illness:  Diarrhea  This is a recurrent problem. The current episode started in the past 7 days. The problem occurs 2 to 4 times per day. The problem has been gradually improving. The stool consistency is described as watery. The patient states that diarrhea does not awaken him from sleep. Pertinent negatives include no abdominal pain, bloating, chills, coughing, fever or headaches. Nothing aggravates the symptoms. He has tried anti-motility drug and electrolyte solution for the symptoms. The treatment provided significant relief.      Review of Systems  Constitutional: Negative for chills and fever.  Respiratory: Negative for cough.   Gastrointestinal: Positive for diarrhea. Negative for abdominal pain and bloating.  Neurological: Negative for headaches.      Observations/Objective: Televisit-patient does not sound to be in distress  Assessment and Plan: Diarrhea Diarrhea symptoms started improving but worsened after patient discontinued antimotility drug.  Encourage patient to continue with bland diet, hydration and electrolyte replacement.  Lomotil reordered.  Advised patient to follow-up with unresolved or worsening symptoms in the next few days.  Patient verbalized understanding. Rx sent to pharmacy.   Follow Up Instructions: Follow-up with worsening symptoms    I discussed the assessment and treatment plan with the patient. The patient was provided an opportunity to ask questions and all were answered. The patient agreed with the plan and demonstrated an understanding of the instructions.   The patient was advised to call back or seek an in-person evaluation if the symptoms worsen or if the condition fails to improve as anticipated.  The above assessment and management plan was discussed with the patient. The patient verbalized understanding of and has agreed to the management plan. Patient is aware to call the clinic if symptoms persist or worsen. Patient is aware when to return to the clinic for a follow-up visit. Patient educated on when it is appropriate to go to the emergency department.   Time call ended: 1:32 PM I provided 12 minutes of non-face-to-face time during this encounter.    Ivy Lynn, NP

## 2020-05-15 NOTE — Patient Instructions (Signed)

## 2020-05-16 ENCOUNTER — Telehealth: Payer: Medicare Other | Admitting: Family Medicine

## 2020-05-16 NOTE — Assessment & Plan Note (Signed)
Diarrhea symptoms started improving but worsened after patient discontinued antimotility drug.  Encourage patient to continue with bland diet, hydration and electrolyte replacement.  Lomotil reordered.  Advised patient to follow-up with unresolved or worsening symptoms in the next few days.  Patient verbalized understanding. Rx sent to pharmacy.

## 2020-05-18 NOTE — Telephone Encounter (Signed)
I saw him for URI in CoVID clinic. I di not see him for diarrhea. I suggest he come I for a visit with Dr. Darnell Level since she is primary.I can see him if she is unavailable.

## 2020-05-18 NOTE — Telephone Encounter (Signed)
Pt called stating that he is on his 2nd refill of his medicine he is taking for his diarrhea. He says the medicine has helped but is not completely getting rid of his diarrhea. Wants advise from provider on what to do before the weekend gets here?  Please advise and call patient.

## 2020-05-18 NOTE — Telephone Encounter (Signed)
Patient had a visit with Dr. Livia Snellen on 3/4 and a video visit with JE on 3/14.  States he is on his second refill of Lomtil and is still having diarrhea. PCP off  Will send to one of the treating physicians

## 2020-05-19 ENCOUNTER — Encounter: Payer: Self-pay | Admitting: Nurse Practitioner

## 2020-05-19 ENCOUNTER — Other Ambulatory Visit: Payer: Self-pay

## 2020-05-19 ENCOUNTER — Ambulatory Visit (INDEPENDENT_AMBULATORY_CARE_PROVIDER_SITE_OTHER): Payer: Medicare Other | Admitting: Nurse Practitioner

## 2020-05-19 VITALS — BP 143/73 | HR 60 | Temp 97.6°F | Resp 20 | Ht 73.0 in | Wt 212.0 lb

## 2020-05-19 DIAGNOSIS — K591 Functional diarrhea: Secondary | ICD-10-CM | POA: Diagnosis not present

## 2020-05-19 DIAGNOSIS — R197 Diarrhea, unspecified: Secondary | ICD-10-CM | POA: Diagnosis not present

## 2020-05-19 MED ORDER — DIPHENOXYLATE-ATROPINE 2.5-0.025 MG PO TABS
2.0000 | ORAL_TABLET | Freq: Four times a day (QID) | ORAL | 0 refills | Status: DC | PRN
Start: 2020-05-19 — End: 2020-05-21

## 2020-05-19 NOTE — Progress Notes (Signed)
   Subjective:    Patient ID: [name] [name], male    DOB: 17-May-1940, 80 y.o.   MRN: [id number]   Chief Complaint: Diarrhea   HPI Patient comes  in today c/o diarrhea that started 2 weeks ago. He has had 2 telephone visit and was given lomotil . The lomotil helps slow things down. He says that stool is loose but  Nit really diarrhea for the last several days. He goes 1 x a day now. He wet this morning and was close to normal. He has had cdiff in the past and doe snot seem the same. Appetite has been good and he has lost no weight. covid test negative. Review of Systems  Constitutional: Negative.   Respiratory: Negative.   Cardiovascular: Negative.   Gastrointestinal: Positive for abdominal pain (slight nut is on right side and is very minor) and diarrhea. Negative for blood in stool and constipation.  Genitourinary: Negative.   Neurological: Negative.   Psychiatric/Behavioral: Negative.   All other systems reviewed and are negative.      Objective:   Physical Exam Vitals and nursing note reviewed.  Constitutional:      Appearance: Normal appearance.  Cardiovascular:     Rate and Rhythm: Normal rate and regular rhythm.     Heart sounds: Normal heart sounds.  Pulmonary:     Effort: Pulmonary effort is normal.     Breath sounds: Normal breath sounds.  Skin:    General: Skin is warm.  Neurological:     General: No focal deficit present.     Mental Status: He is alert.  Psychiatric:        Mood and Affect: Mood normal.        Behavior: Behavior normal.     BP (!) 143/73   Pulse 60   Temp 97.6 F (36.4 C) (Temporal)   Resp 20   Ht 6\' 1"  (1.854 m)   Wt 212 lb (96.2 kg)   SpO2 97%   BMI 27.97 kg/m       Assessment & Plan:  [name] [name] in today with chief complaint of Diarrhea   1. Diarrhea, unspecified type Force fluids Avoid foods that make things worse - diphenoxylate-atropine (LOMOTIL) 2.5-0.025 MG tablet; Take 2 tablets by mouth 4 (four) times  daily as needed for diarrhea or loose stools.  Dispense: 30 tablet; Refill: 0 - Cdiff NAA+O+P+Stool Culture; Future   The above assessment and management plan was discussed with the patient. The patient verbalized understanding of and has agreed to the management plan. Patient is aware to call the clinic if symptoms persist or worsen. Patient is aware when to return to the clinic for a follow-up visit. Patient educated on when it is appropriate to go to the emergency department.   Mary-Margaret Hassell Done, FNP

## 2020-05-19 NOTE — Telephone Encounter (Signed)
Appointment scheduled.

## 2020-05-19 NOTE — Patient Instructions (Signed)

## 2020-05-21 ENCOUNTER — Other Ambulatory Visit: Payer: Self-pay | Admitting: Family Medicine

## 2020-05-21 DIAGNOSIS — R197 Diarrhea, unspecified: Secondary | ICD-10-CM

## 2020-05-21 MED ORDER — DIPHENOXYLATE-ATROPINE 2.5-0.025 MG PO TABS: 2.0000 | ORAL_TABLET | Freq: Four times a day (QID) | ORAL | 0 refills | Status: DC | PRN

## 2020-05-21 NOTE — Progress Notes (Signed)
Refilled lomotil Caryl Pina, MD [facility] 05/21/2020, 10:16 AM

## 2020-05-22 ENCOUNTER — Other Ambulatory Visit: Payer: Self-pay

## 2020-05-22 ENCOUNTER — Other Ambulatory Visit: Payer: Medicare Other

## 2020-05-22 DIAGNOSIS — R197 Diarrhea, unspecified: Secondary | ICD-10-CM | POA: Diagnosis not present

## 2020-05-22 DIAGNOSIS — Z8052 Family history of malignant neoplasm of bladder: Secondary | ICD-10-CM

## 2020-05-22 DIAGNOSIS — E785 Hyperlipidemia, unspecified: Secondary | ICD-10-CM | POA: Diagnosis not present

## 2020-05-22 DIAGNOSIS — D696 Thrombocytopenia, unspecified: Secondary | ICD-10-CM | POA: Diagnosis not present

## 2020-05-22 DIAGNOSIS — E559 Vitamin D deficiency, unspecified: Secondary | ICD-10-CM

## 2020-05-22 DIAGNOSIS — I10 Essential (primary) hypertension: Secondary | ICD-10-CM | POA: Diagnosis not present

## 2020-05-22 LAB — MICROSCOPIC EXAMINATION
Bacteria, UA: NONE SEEN
Epithelial Cells (non renal): NONE SEEN /hpf (ref 0–10)
RBC, Urine: NONE SEEN /hpf (ref 0–2)
WBC, UA: NONE SEEN /hpf (ref 0–5)

## 2020-05-22 LAB — URINALYSIS, COMPLETE
Bilirubin, UA: NEGATIVE
Glucose, UA: NEGATIVE
Ketones, UA: NEGATIVE
Leukocytes,UA: NEGATIVE
Nitrite, UA: NEGATIVE
Protein,UA: NEGATIVE
RBC, UA: NEGATIVE
Specific Gravity, UA: 1.025 (ref 1.005–1.030)
Urobilinogen, Ur: 0.2 mg/dL (ref 0.2–1.0)
pH, UA: 5.5 (ref 5.0–7.5)

## 2020-05-23 ENCOUNTER — Ambulatory Visit (INDEPENDENT_AMBULATORY_CARE_PROVIDER_SITE_OTHER): Payer: Medicare Other | Admitting: Family Medicine

## 2020-05-23 ENCOUNTER — Encounter: Payer: Self-pay | Admitting: Family Medicine

## 2020-05-23 VITALS — BP 158/73 | HR 62 | Temp 98.0°F | Resp 20 | Ht 73.0 in | Wt 214.0 lb

## 2020-05-23 DIAGNOSIS — I10 Essential (primary) hypertension: Secondary | ICD-10-CM | POA: Diagnosis not present

## 2020-05-23 DIAGNOSIS — R197 Diarrhea, unspecified: Secondary | ICD-10-CM

## 2020-05-23 DIAGNOSIS — E785 Hyperlipidemia, unspecified: Secondary | ICD-10-CM

## 2020-05-23 LAB — CBC WITH DIFFERENTIAL/PLATELET
Basophils Absolute: 0 10*3/uL (ref 0.0–0.2)
Basos: 1 %
EOS (ABSOLUTE): 0.1 10*3/uL (ref 0.0–0.4)
Eos: 1 %
Hematocrit: 41.8 % (ref 37.5–51.0)
Hemoglobin: 13.9 g/dL (ref 13.0–17.7)
Immature Grans (Abs): 0 10*3/uL (ref 0.0–0.1)
Immature Granulocytes: 0 %
Lymphocytes Absolute: 1.7 10*3/uL (ref 0.7–3.1)
Lymphs: 25 %
MCH: 30.5 pg (ref 26.6–33.0)
MCHC: 33.3 g/dL (ref 31.5–35.7)
MCV: 92 fL (ref 79–97)
Monocytes Absolute: 0.6 10*3/uL (ref 0.1–0.9)
Monocytes: 8 %
Neutrophils Absolute: 4.4 10*3/uL (ref 1.4–7.0)
Neutrophils: 65 %
Platelets: 168 10*3/uL (ref 150–450)
RBC: 4.55 x10E6/uL (ref 4.14–5.80)
RDW: 12.3 % (ref 11.6–15.4)
WBC: 6.7 10*3/uL (ref 3.4–10.8)

## 2020-05-23 LAB — LIPID PANEL
Chol/HDL Ratio: 2.5 ratio (ref 0.0–5.0)
Cholesterol, Total: 121 mg/dL (ref 100–199)
HDL: 49 mg/dL (ref >39)
LDL Chol Calc (NIH): 55 mg/dL (ref 0–99)
Triglycerides: 91 mg/dL (ref 0–149)
VLDL Cholesterol Cal: 17 mg/dL (ref 5–40)

## 2020-05-23 LAB — CMP14+EGFR
ALT: 16 IU/L (ref 0–44)
AST: 18 IU/L (ref 0–40)
Albumin/Globulin Ratio: 1.8 (ref 1.2–2.2)
Albumin: 4.2 g/dL (ref 3.7–4.7)
Alkaline Phosphatase: 72 IU/L (ref 44–121)
BUN/Creatinine Ratio: 16 (ref 10–24)
BUN: 15 mg/dL (ref 8–27)
Bilirubin Total: 0.5 mg/dL (ref 0.0–1.2)
CO2: 25 mmol/L (ref 20–29)
Calcium: 9.2 mg/dL (ref 8.6–10.2)
Chloride: 106 mmol/L (ref 96–106)
Creatinine, Ser: 0.94 mg/dL (ref 0.76–1.27)
Globulin, Total: 2.3 g/dL (ref 1.5–4.5)
Glucose: 101 mg/dL — ABNORMAL HIGH (ref 65–99)
Potassium: 4.2 mmol/L (ref 3.5–5.2)
Sodium: 143 mmol/L (ref 134–144)
Total Protein: 6.5 g/dL (ref 6.0–8.5)
eGFR: 83 mL/min/{1.73_m2} (ref >59)

## 2020-05-23 LAB — VITAMIN D 25 HYDROXY (VIT D DEFICIENCY, FRACTURES): Vit D, 25-Hydroxy: 64.8 ng/mL (ref 30.0–100.0)

## 2020-05-23 NOTE — Patient Instructions (Signed)
Probiotic Hydrate No juice for now  If no better in 1-2 weeks and stool studies are normal, let me know and we can talk about Xifaxan

## 2020-05-23 NOTE — Progress Notes (Signed)
Subjective: CC: f/u diarrhea PCP: [name], DO XNA:TFTDDU [name] [name] is a 80 y.o. male presenting to clinic today for:  1. Diarrhea Patient with ongoing loose stools.  He notes is actually started several weeks ago as watery diarrhea that is gradually become more formed, albeit still very soft and loose.  No hematochezia or melena.  No associated nausea or vomiting.  Lomotil did seem to help some.  Is gone through 2 rounds of this.  He has been holding his digestive enzymes, probiotics.  He does admit he has been drinking water down white grape juice in efforts to improve symptoms.  He started his stool studies but has not heard back from these yet.  2.  Hypertension with hyperlipidemia Patient is compliant with his metoprolol and Repatha.  No chest pain, shortness of breath.  No refills needed at this time.   ROS: Per HPI  Allergies  Allergen Reactions  . Levofloxacin     Caused C- Diff  . Crestor [Rosuvastatin] Other (See Comments)    Liver problems  . Vytorin [Ezetimibe-Simvastatin] Other (See Comments)    Liver problems   Past Medical History:  Diagnosis Date  . AAA (abdominal aortic aneurysm) ([place])    gets abd [place] every 3 years to watch,last check 08/2018  . Arthritis   . BPH (benign prostatic hyperplasia)   . CAD (coronary artery disease)   . Colon polyps   . Dyslipidemia   . GERD (gastroesophageal reflux disease)   . Heart murmur   . Herniated disc   . Hiatal hernia   . History of kidney stones   . HTN (hypertension)   . Hyperlipidemia   . OSA on CPAP   . Tinnitus     Current Outpatient Medications:  .  aspirin EC 81 MG tablet, Take 81 mg by mouth daily. Swallow whole., Disp: , Rfl:  .  Digestive Enzymes (ENZYME DIGEST PO), Take 1 capsule by mouth at bedtime., Disp: , Rfl:  .  diphenoxylate-atropine (LOMOTIL) 2.5-0.025 MG tablet, Take 2 tablets by mouth 4 (four) times daily as needed for diarrhea or loose stools., Disp: 30 tablet, Rfl: 0 .   Evolocumab (REPATHA SURECLICK) 202 MG/ML SOAJ, INJECT 140MG  EVERY 2 WEEKS, Disp: 2 mL, Rfl: 11 .  fluticasone (FLONASE) 50 MCG/ACT nasal spray, Place 2 sprays into both nostrils daily as needed for allergies or rhinitis., Disp: , Rfl:  .  metoprolol tartrate (LOPRESSOR) 25 MG tablet, TAKE 1 TABLET TWICE A DAY, Disp: 180 tablet, Rfl: 3 .  nitroGLYCERIN (NITROSTAT) 0.4 MG SL tablet, Place 1 tablet (0.4 mg total) under the tongue every 5 (five) minutes as needed for chest pain., Disp: 25 tablet, Rfl: prn .  omeprazole (PRILOSEC) 20 MG capsule, Take 20 mg by mouth daily., Disp: , Rfl:  .  Probiotic Product (PROBIOTIC PO), Take 1 capsule by mouth daily., Disp: , Rfl:  .  triamcinolone cream (KENALOG) 0.1 %, Apply 1 application topically 2 (two) times daily. x7-10 days, Disp: 30 g, Rfl: 0 Social History   Socioeconomic History  . Marital status: Married    Spouse name: Constance Holster  . Number of children: 2  . Years of education: Not on file  . Highest education level: Bachelor's degree (e.g., BA, AB, BS)  Occupational History  . Occupation: Armed forces technical officer for [employer]  Tobacco Use  . Smoking status: Never Smoker  . Smokeless tobacco: Never Used  . Tobacco comment: does not smoke   Vaping Use  . Vaping  Use: Never used  Substance and Sexual Activity  . Alcohol use: Yes    Alcohol/week: 1.0 standard drink    Types: 1 Glasses of wine per week    Comment: with meal  . Drug use: No  . Sexual activity: Yes  Other Topics Concern  . Not on file  Social History Narrative   Married 37+ years, 2 adult children and one granddaughter. Engineer, building services at [employer].    Social Determinants of Health   Financial Resource Strain: Low Risk   . Difficulty of Paying Living Expenses: Not hard at all  Food Insecurity: No Food Insecurity  . Worried About Charity fundraiser in the Last Year: Never true  . Ran Out of Food in the Last Year: Never true  Transportation Needs: No Transportation  Needs  . Lack of Transportation (Medical): No  . Lack of Transportation (Non-Medical): No  Physical Activity: Sufficiently Active  . Days of Exercise per Week: 5 days  . Minutes of Exercise per Session: 50 min  Stress: No Stress Concern Present  . Feeling of Stress : Not at all  Social Connections: Socially Integrated  . Frequency of Communication with Friends and Family: More than three times a week  . Frequency of Social Gatherings with Friends and Family: More than three times a week  . Attends Religious Services: More than 4 times per year  . Active Member of Clubs or Organizations: Yes  . Attends Archivist Meetings: More than 4 times per year  . Marital Status: Married  Human resources officer Violence: Not At Risk  . Fear of Current or Ex-Partner: No  . Emotionally Abused: No  . Physically Abused: No  . Sexually Abused: No   Family History  Problem Relation Age of Onset  . Heart disease Father   . Cancer Father        Bladder Cancer  . Heart attack Father   . Colon polyps Father   . Hypertension Mother   . Alzheimer's disease Mother   . Stroke Mother        3-4   . Diabetes Brother   . Heart disease Brother   . Arthritis Brother        bilateral knee replacement, back issues   . Pneumonia Sister   . GI problems Sister        esophogus stretched   . Crohn's disease Son     Objective: Office vital signs reviewed. BP (!) 158/73   Pulse 62   Temp 98 F (36.7 C)   Resp 20   Ht 6\' 1"  (1.854 m)   Wt 214 lb (97.1 kg)   SpO2 98%   BMI 28.23 kg/m   Physical Examination:  General: Awake, alert, well nourished, No acute distress HEENT: Normal; sclera white.  No carotid bruits Cardio: regular rate and rhythm, S1S2 heard, no murmurs appreciated Pulm: clear to auscultation bilaterally, no wheezes, rhonchi or rales; normal work of breathing on room air GI: soft, non-tender, non-distended, bowel sounds present x4, no hepatomegaly, no splenomegaly, no  masses Extremities: warm, well perfused, No edema, cyanosis or clubbing; +2 pulses bilaterally MSK: normal gait and station  Assessment/ Plan: 80 y.o. male   Essential hypertension  Hyperlipidemia LDL goal <70  Diarrhea of presumed infectious origin  Blood pressure is not at goal.  Would like to have this rechecked in the next couple of weeks given history of aortic aneurysm and CAD.  May need to consider advancing blood pressure  therapy.  We reviewed his labs today.  Continue Repatha  Stool studies are pending.  No apparent electrolyte abnormalities, injury to kidneys or liver.  Advised to resume probiotic and discontinue fruit juice as I do think that the fruit juice would cause worsening diarrhea.  Hydrate.  If symptoms do not resolve in the next couple of weeks, could consider trial of Xifaxan.   No orders of the defined types were placed in this encounter.  No orders of the defined types were placed in this encounter.    [name], DO [place] [phone]

## 2020-05-25 ENCOUNTER — Encounter: Payer: Self-pay | Admitting: Family Medicine

## 2020-05-26 LAB — CDIFF NAA+O+P+STOOL CULTURE
E coli, Shiga toxin Assay: NEGATIVE
Toxigenic C. Difficile by PCR: NEGATIVE

## 2020-06-02 ENCOUNTER — Other Ambulatory Visit: Payer: Self-pay | Admitting: Nurse Practitioner

## 2020-06-02 DIAGNOSIS — K591 Functional diarrhea: Secondary | ICD-10-CM

## 2020-06-15 DIAGNOSIS — R109 Unspecified abdominal pain: Secondary | ICD-10-CM | POA: Diagnosis not present

## 2020-06-15 DIAGNOSIS — Z8371 Family history of colonic polyps: Secondary | ICD-10-CM | POA: Diagnosis not present

## 2020-06-15 DIAGNOSIS — R194 Change in bowel habit: Secondary | ICD-10-CM | POA: Diagnosis not present

## 2020-06-23 ENCOUNTER — Encounter: Payer: Self-pay | Admitting: *Deleted

## 2020-06-23 ENCOUNTER — Other Ambulatory Visit: Payer: Self-pay | Admitting: Nurse Practitioner

## 2020-06-23 DIAGNOSIS — R197 Diarrhea, unspecified: Secondary | ICD-10-CM

## 2020-07-10 ENCOUNTER — Telehealth: Payer: Self-pay

## 2020-07-11 NOTE — Telephone Encounter (Signed)
Pt has questions about a medication

## 2020-07-11 NOTE — Telephone Encounter (Signed)
Pt checking in for Repatha samples Returned call We don't currently have any, emailed rep to see if we can have receive

## 2020-07-12 DIAGNOSIS — K219 Gastro-esophageal reflux disease without esophagitis: Secondary | ICD-10-CM | POA: Diagnosis not present

## 2020-07-12 DIAGNOSIS — R194 Change in bowel habit: Secondary | ICD-10-CM | POA: Diagnosis not present

## 2020-07-12 DIAGNOSIS — K449 Diaphragmatic hernia without obstruction or gangrene: Secondary | ICD-10-CM | POA: Diagnosis not present

## 2020-07-12 DIAGNOSIS — K317 Polyp of stomach and duodenum: Secondary | ICD-10-CM | POA: Diagnosis not present

## 2020-07-12 DIAGNOSIS — R1310 Dysphagia, unspecified: Secondary | ICD-10-CM | POA: Diagnosis not present

## 2020-07-12 DIAGNOSIS — Z8371 Family history of colonic polyps: Secondary | ICD-10-CM | POA: Diagnosis not present

## 2020-07-12 DIAGNOSIS — K52832 Lymphocytic colitis: Secondary | ICD-10-CM | POA: Diagnosis not present

## 2020-07-14 DIAGNOSIS — K52832 Lymphocytic colitis: Secondary | ICD-10-CM | POA: Diagnosis not present

## 2020-07-24 ENCOUNTER — Telehealth: Payer: Self-pay | Admitting: Cardiology

## 2020-07-24 MED ORDER — REPATHA SURECLICK 140 MG/ML ~~LOC~~ SOAJ: SUBCUTANEOUS | 3 refills | Status: DC

## 2020-07-24 NOTE — Telephone Encounter (Signed)
Called and spoke w/pt and got the [employer] grant approved at $2500 and pt voiced understanding AND GRATITUDE

## 2020-07-24 NOTE — Telephone Encounter (Signed)
Will forward to the pharmacy staff to see if they can help.

## 2020-07-24 NOTE — Telephone Encounter (Signed)
Pt c/o medication issue:  1. Name of Medication: Evolocumab (REPATHA SURECLICK) 446 MG/ML SOAJ  2. How are you currently taking this medication (dosage and times per day)? Inject every 14 days  3. Are you having a reaction (difficulty breathing--STAT)? no  4. What is your medication issue? Patient states he gets medication once a month and will be 2 weeks short soon. He states he would like some financial assistance for the medication and samples to help him get through the 2 week period.

## 2020-07-26 ENCOUNTER — Telehealth: Payer: Self-pay | Admitting: Pharmacist

## 2020-07-26 NOTE — Telephone Encounter (Signed)
repatha sample in fridge for pick up Patient and spouse aware

## 2020-08-07 ENCOUNTER — Other Ambulatory Visit (HOSPITAL_COMMUNITY): Payer: Self-pay | Admitting: Cardiology

## 2020-08-07 DIAGNOSIS — I714 Abdominal aortic aneurysm, without rupture, unspecified: Secondary | ICD-10-CM

## 2020-08-16 DIAGNOSIS — K52832 Lymphocytic colitis: Secondary | ICD-10-CM | POA: Diagnosis not present

## 2020-08-17 DIAGNOSIS — H2513 Age-related nuclear cataract, bilateral: Secondary | ICD-10-CM | POA: Diagnosis not present

## 2020-08-25 ENCOUNTER — Telehealth: Payer: Self-pay | Admitting: Family Medicine

## 2020-08-25 ENCOUNTER — Other Ambulatory Visit: Payer: Self-pay | Admitting: *Deleted

## 2020-08-25 DIAGNOSIS — Z8052 Family history of malignant neoplasm of bladder: Secondary | ICD-10-CM

## 2020-08-25 DIAGNOSIS — I10 Essential (primary) hypertension: Secondary | ICD-10-CM

## 2020-08-25 DIAGNOSIS — E785 Hyperlipidemia, unspecified: Secondary | ICD-10-CM

## 2020-08-25 NOTE — Telephone Encounter (Signed)
Pt has appt to see Dr Lajuana Ripple on 6/29 for a check up and wants to come in on 6/27 to do his lab work.  Please add lab order for upcoming visit.

## 2020-08-25 NOTE — Telephone Encounter (Signed)
Lab orders placed.  

## 2020-08-28 ENCOUNTER — Other Ambulatory Visit: Payer: Medicare Other

## 2020-08-28 ENCOUNTER — Other Ambulatory Visit: Payer: Self-pay

## 2020-08-28 DIAGNOSIS — Z8052 Family history of malignant neoplasm of bladder: Secondary | ICD-10-CM | POA: Diagnosis not present

## 2020-08-28 DIAGNOSIS — I10 Essential (primary) hypertension: Secondary | ICD-10-CM

## 2020-08-28 DIAGNOSIS — E785 Hyperlipidemia, unspecified: Secondary | ICD-10-CM | POA: Diagnosis not present

## 2020-08-29 ENCOUNTER — Ambulatory Visit: Payer: Medicare Other | Admitting: Family Medicine

## 2020-08-29 LAB — LIPID PANEL
Chol/HDL Ratio: 2.2 ratio (ref 0.0–5.0)
Cholesterol, Total: 121 mg/dL (ref 100–199)
HDL: 54 mg/dL (ref >39)
LDL Chol Calc (NIH): 54 mg/dL (ref 0–99)
Triglycerides: 61 mg/dL (ref 0–149)
VLDL Cholesterol Cal: 13 mg/dL (ref 5–40)

## 2020-08-29 LAB — URINALYSIS, COMPLETE
Bilirubin, UA: NEGATIVE
Glucose, UA: NEGATIVE
Ketones, UA: NEGATIVE
Leukocytes,UA: NEGATIVE
Nitrite, UA: NEGATIVE
Protein,UA: NEGATIVE
RBC, UA: NEGATIVE
Specific Gravity, UA: 1.02 (ref 1.005–1.030)
Urobilinogen, Ur: 0.2 mg/dL (ref 0.2–1.0)
pH, UA: 6 (ref 5.0–7.5)

## 2020-08-29 LAB — CBC WITH DIFFERENTIAL/PLATELET
Basophils Absolute: 0 10*3/uL (ref 0.0–0.2)
Basos: 1 %
EOS (ABSOLUTE): 0.1 10*3/uL (ref 0.0–0.4)
Eos: 1 %
Hematocrit: 42.7 % (ref 37.5–51.0)
Hemoglobin: 13.8 g/dL (ref 13.0–17.7)
Immature Grans (Abs): 0 10*3/uL (ref 0.0–0.1)
Immature Granulocytes: 1 %
Lymphocytes Absolute: 1.8 10*3/uL (ref 0.7–3.1)
Lymphs: 28 %
MCH: 29.4 pg (ref 26.6–33.0)
MCHC: 32.3 g/dL (ref 31.5–35.7)
MCV: 91 fL (ref 79–97)
Monocytes Absolute: 0.5 10*3/uL (ref 0.1–0.9)
Monocytes: 8 %
Neutrophils Absolute: 3.9 10*3/uL (ref 1.4–7.0)
Neutrophils: 61 %
Platelets: 155 10*3/uL (ref 150–450)
RBC: 4.7 x10E6/uL (ref 4.14–5.80)
RDW: 13.1 % (ref 11.6–15.4)
WBC: 6.3 10*3/uL (ref 3.4–10.8)

## 2020-08-29 LAB — CMP14+EGFR
ALT: 18 IU/L (ref 0–44)
AST: 21 IU/L (ref 0–40)
Albumin/Globulin Ratio: 2.1 (ref 1.2–2.2)
Albumin: 4.5 g/dL (ref 3.7–4.7)
Alkaline Phosphatase: 72 IU/L (ref 44–121)
BUN/Creatinine Ratio: 19 (ref 10–24)
BUN: 17 mg/dL (ref 8–27)
Bilirubin Total: 0.5 mg/dL (ref 0.0–1.2)
CO2: 23 mmol/L (ref 20–29)
Calcium: 9.5 mg/dL (ref 8.6–10.2)
Chloride: 105 mmol/L (ref 96–106)
Creatinine, Ser: 0.88 mg/dL (ref 0.76–1.27)
Globulin, Total: 2.1 g/dL (ref 1.5–4.5)
Glucose: 96 mg/dL (ref 65–99)
Potassium: 4.6 mmol/L (ref 3.5–5.2)
Sodium: 143 mmol/L (ref 134–144)
Total Protein: 6.6 g/dL (ref 6.0–8.5)
eGFR: 89 mL/min/{1.73_m2} (ref >59)

## 2020-08-29 LAB — MICROSCOPIC EXAMINATION
Bacteria, UA: NONE SEEN
RBC, Urine: NONE SEEN /HPF (ref 0–2)
WBC, UA: NONE SEEN /HPF (ref 0–5)

## 2020-08-30 ENCOUNTER — Other Ambulatory Visit: Payer: Self-pay

## 2020-08-30 ENCOUNTER — Encounter: Payer: Self-pay | Admitting: Family Medicine

## 2020-08-30 ENCOUNTER — Ambulatory Visit (INDEPENDENT_AMBULATORY_CARE_PROVIDER_SITE_OTHER): Payer: Medicare Other | Admitting: Family Medicine

## 2020-08-30 VITALS — BP 145/74 | HR 59 | Resp 20 | Ht 73.0 in | Wt 213.0 lb

## 2020-08-30 DIAGNOSIS — G479 Sleep disorder, unspecified: Secondary | ICD-10-CM

## 2020-08-30 DIAGNOSIS — K52832 Lymphocytic colitis: Secondary | ICD-10-CM | POA: Diagnosis not present

## 2020-08-30 DIAGNOSIS — I10 Essential (primary) hypertension: Secondary | ICD-10-CM | POA: Diagnosis not present

## 2020-08-30 DIAGNOSIS — H6121 Impacted cerumen, right ear: Secondary | ICD-10-CM

## 2020-08-30 NOTE — Patient Instructions (Signed)
Can take up to 10mg  of Melatonin for sleep if needed

## 2020-08-30 NOTE — Progress Notes (Signed)
Subjective: CC: Chronic follow-up PCP: [name] Norlander, DO ZYS:[id number] [name] [name] is a 80 y.o. male presenting to clinic today for:  1.  Right ear irritation Patient reports he has been sustaining some right ear irritation with associated hearing loss.  Has had frequency of cerumen buildup on that side would like me to check this out today.  2.  Lymphocytic colitis Patient's diarrhea ended up being from lymphocytic colitis which was identified by his gastroenterologist.  He is currently taking mesalamine 4 every morning with plans to eventually taper down and off over the next several months.  His diarrhea admittedly is better.  He has follow-up with Dr. Watt Climes on November 14, 2020.  3.  Hypertension Patient is compliant with his Lopressor.  No reports of chest pain, shortness of breath or edema.  He tries to stay physically active.   ROS: Per HPI  Allergies  Allergen Reactions   Levofloxacin     Caused C- Diff   Crestor [Rosuvastatin] Other (See Comments)    Liver problems   Vytorin [Ezetimibe-Simvastatin] Other (See Comments)    Liver problems   Past Medical History:  Diagnosis Date   AAA (abdominal aortic aneurysm) ([place])    gets abd [place] every 3 years to watch,last check 08/2018   Arthritis    BPH (benign prostatic hyperplasia)    CAD (coronary artery disease)    Colon polyps    Dyslipidemia    GERD (gastroesophageal reflux disease)    Heart murmur    Herniated disc    Hiatal hernia    History of kidney stones    HTN (hypertension)    Hyperlipidemia    OSA on CPAP    Tinnitus     Current Outpatient Medications:    aspirin EC 81 MG tablet, Take 81 mg by mouth daily. Swallow whole., Disp: , Rfl:    Cholecalciferol (CVS VIT D 5000 HIGH-POTENCY PO), Take 1 tablet by mouth daily., Disp: , Rfl:    Digestive Enzymes (ENZYME DIGEST PO), Take 1 capsule by mouth at bedtime. (Patient not taking: Reported on 05/23/2020), Disp: , Rfl:    diphenoxylate-atropine (LOMOTIL)  2.5-0.025 MG tablet, TAKE 2 TABLETS 4 TIMES A DAY AS NEEDED FOR DIARRHEA OR LOOSE STOOLS, Disp: 30 tablet, Rfl: 0   Evolocumab (REPATHA SURECLICK) 010 MG/ML SOAJ, INJECT 140MG  EVERY 2 WEEKS, Disp: 6 mL, Rfl: 3   fluticasone (FLONASE) 50 MCG/ACT nasal spray, Place 2 sprays into both nostrils daily as needed for allergies or rhinitis. (Patient not taking: Reported on 05/23/2020), Disp: , Rfl:    metoprolol tartrate (LOPRESSOR) 25 MG tablet, TAKE 1 TABLET TWICE A DAY, Disp: 180 tablet, Rfl: 3   nitroGLYCERIN (NITROSTAT) 0.4 MG SL tablet, Place 1 tablet (0.4 mg total) under the tongue every 5 (five) minutes as needed for chest pain. (Patient not taking: Reported on 05/23/2020), Disp: 25 tablet, Rfl: prn   omeprazole (PRILOSEC) 20 MG capsule, Take 20 mg by mouth daily., Disp: , Rfl:    Probiotic Product (PROBIOTIC PO), Take 1 capsule by mouth daily. (Patient not taking: Reported on 05/23/2020), Disp: , Rfl:    triamcinolone cream (KENALOG) 0.1 %, Apply 1 application topically 2 (two) times daily. x7-10 days, Disp: 30 g, Rfl: 0 Social History   Socioeconomic History   Marital status: Married    Spouse name: Constance Holster   Number of children: 2   Years of education: Not on file   Highest education level: Bachelor's degree (e.g., BA, AB, BS)  Occupational  History   Occupation: Armed forces technical officer for [employer]  Tobacco Use   Smoking status: Never   Smokeless tobacco: Never   Tobacco comments:    does not smoke   Vaping Use   Vaping Use: Never used  Substance and Sexual Activity   Alcohol use: Yes    Alcohol/week: 1.0 standard drink    Types: 1 Glasses of wine per week    Comment: with meal   Drug use: No   Sexual activity: Yes  Other Topics Concern   Not on file  Social History Narrative   Married 37+ years, 2 adult children and one granddaughter. Engineer, building services at [employer].    Social Determinants of Health   Financial Resource Strain: Low Risk    Difficulty of Paying Living  Expenses: Not hard at all  Food Insecurity: No Food Insecurity   Worried About Charity fundraiser in the Last Year: Never true   [place] in the Last Year: Never true  Transportation Needs: No Transportation Needs   Lack of Transportation (Medical): No   Lack of Transportation (Non-Medical): No  Physical Activity: Sufficiently Active   Days of Exercise per Week: 5 days   Minutes of Exercise per Session: 50 min  Stress: No Stress Concern Present   Feeling of Stress : Not at all  Social Connections: Socially Integrated   Frequency of Communication with Friends and Family: More than three times a week   Frequency of Social Gatherings with Friends and Family: More than three times a week   Attends Religious Services: More than 4 times per year   Active Member of [employer] or Organizations: Yes   Attends Music therapist: More than 4 times per year   Marital Status: Married  Human resources officer Violence: Not At Risk   Fear of Current or Ex-Partner: No   Emotionally Abused: No   Physically Abused: No   Sexually Abused: No   Family History  Problem Relation Age of Onset   Heart disease Father    Cancer Father        Bladder Cancer   Heart attack Father    Colon polyps Father    Hypertension Mother    Alzheimer's disease Mother    Stroke Mother        3-4    Diabetes Brother    Heart disease Brother    Arthritis Brother        bilateral knee replacement, back issues    Pneumonia Sister    GI problems Sister        esophogus stretched    Crohn's disease Son     Objective: Office vital signs reviewed. BP (!) 145/74   Pulse (!) 59   Resp 20   Ht 6\' 1"  (1.854 m)   Wt 213 lb (96.6 kg)   SpO2 98%   BMI 28.10 kg/m   Physical Examination:  General: Awake, alert, well nourished, No acute distress HEENT: Normal; right external auditory canal with a dry piece of cerumen noted along the 5 o'clock position.  TMs intact bilaterally with no appreciable effusions.  No  carotid bruits Cardio: regular rate and rhythm, S1S2 heard, no murmurs appreciated Pulm: clear to auscultation bilaterally, no wheezes, rhonchi or rales; normal work of breathing on room air Extremities: warm, well perfused, No edema, cyanosis or clubbing; +2 pulses bilaterally MSK: Normal gait and station Neuro: Alert and oriented  Assessment/ Plan: 80 y.o. male   Essential hypertension  Lymphocytic colitis  Impacted cerumen of right ear  Sleep difficulties  Blood pressure well controlled.  Continue current regimen  Currently being treated with mesalamine.  Symptoms are improving.  Continue to follow with GI as directed  We attempted to irrigate the cerumen in the right ear but unfortunately this was unsuccessful.  Advised to use Debrox in the affected area and may return at his convenience to retry irrigation.  With regards to sleep difficulties we discussed the need to use up to 10 mg of melatonin at bedtime if he desires.  Should he wish to proceed with pharmacologic intervention, we would consider trazodone versus Belsomra versus Rozarem.  He will follow-up as needed on that issue  Additionally, we reviewed his lab results which were totally normal  No orders of the defined types were placed in this encounter.  No orders of the defined types were placed in this encounter.    [name] Norlander, DO [place] [phone]

## 2020-08-31 ENCOUNTER — Ambulatory Visit (HOSPITAL_COMMUNITY)
Admission: RE | Admit: 2020-08-31 | Discharge: 2020-08-31 | Disposition: A | Discharge status: Home / Self Care | Payer: Medicare Other | Source: Ambulatory Visit | Attending: Cardiovascular Disease | Admitting: Cardiovascular Disease

## 2020-08-31 ENCOUNTER — Encounter: Payer: Self-pay | Admitting: *Deleted

## 2020-08-31 ENCOUNTER — Other Ambulatory Visit (HOSPITAL_COMMUNITY): Payer: Self-pay | Admitting: Cardiology

## 2020-08-31 DIAGNOSIS — I714 Abdominal aortic aneurysm, without rupture, unspecified: Secondary | ICD-10-CM

## 2020-08-31 DIAGNOSIS — I723 Aneurysm of iliac artery: Secondary | ICD-10-CM

## 2020-09-01 ENCOUNTER — Ambulatory Visit: Payer: Medicare Other | Admitting: Family Medicine

## 2020-09-12 DIAGNOSIS — Z23 Encounter for immunization: Secondary | ICD-10-CM | POA: Diagnosis not present

## 2020-09-20 ENCOUNTER — Ambulatory Visit (INDEPENDENT_AMBULATORY_CARE_PROVIDER_SITE_OTHER): Payer: Medicare Other | Admitting: *Deleted

## 2020-09-20 DIAGNOSIS — Z0000 Encounter for general adult medical examination without abnormal findings: Secondary | ICD-10-CM

## 2020-09-20 NOTE — Progress Notes (Signed)
MEDICARE ANNUAL WELLNESS VISIT  09/20/2020  Telephone Visit Disclaimer This Medicare AWV was conducted by telephone due to national recommendations for restrictions regarding the COVID-19 Pandemic (e.g. social distancing).  I verified, using two identifiers, that I am speaking with [name] [name] or their authorized healthcare agent. I discussed the limitations, risks, security, and privacy concerns of performing an evaluation and management service by telephone and the potential availability of an in-person appointment in the future. The patient expressed understanding and agreed to proceed.  Location of Patient: Home Location of Provider (nurse):  [facility]  Subjective:    [name] [name] is a 80 y.o. male patient of [name], DO who had a Medicare Annual Wellness Visit today via telephone. [name] [name] is Retired and lives with their spouse. he has 2 children. he reports that he is socially active and does interact with friends/family regularly. he is moderately physically active and enjoys golfing, playing cards, going to church, and getting together with friends.  Patient Care Team: [name], DO as PCP - General (Family Medicine) Minus Breeding, MD as PCP - Cardiology (Cardiology) Rutherford Guys, MD as Consulting Physician (Ophthalmology) Clarene Essex, MD as Consulting Physician (Gastroenterology) Irine Seal, MD as Attending Physician (Urology) Gaynelle Arabian, MD as Consulting Physician (Orthopedic Surgery) Lavonna Monarch, MD as Consulting Physician (Dermatology) Lavera Guise, [facility] (Pharmacist)  Advanced Directives 09/20/2020 09/20/2019 06/01/2019 05/31/2019 05/24/2019 09/18/2018 05/01/2018  Does Patient Have a Medical Advance Directive? Yes Yes Yes Yes Yes Yes Yes  Type of Paramedic of [place];Living will [place];Living will Living will;Healthcare Power of [place];Living will [place];Living will Living will;Healthcare Power of [place];Living will  Does patient want to make changes to medical advance directive? No - Patient declined No - Patient declined - No - Patient declined No - Patient declined No - Patient declined -  Copy of [place] in Chart? Yes - validated most recent copy scanned in chart (See row information) No - copy requested No - copy requested No - copy requested No - copy requested No - copy requested -  Would patient like information on creating a medical advance directive? - - - - - [facility] Utilization Over the Past 12 Months: # of hospitalizations or ER visits: 0 # of surgeries: 0  Review of Systems    Patient reports that his overall health is better compared to last year.  Patient has no complaints or concerns today  Patient Reported Readings (BP, Pulse, CBG, Weight, etc) none  Pain Assessment Pain : No/denies pain     Current Medications & Allergies (verified) Allergies as of 09/20/2020       Reactions   Levofloxacin    Caused C- Diff   Crestor [rosuvastatin] Other (See Comments)   Liver problems   Vytorin [ezetimibe-simvastatin] Other (See Comments)   Liver problems        Medication List        Accurate as of September 20, 2020  2:30 PM. If you have any questions, ask your nurse or doctor.          STOP taking these medications    triamcinolone cream 0.1 % Commonly known as: KENALOG       TAKE these medications    aspirin EC 81 MG tablet Take 81 mg by mouth daily. Swallow whole.   CVS  VIT D 5000 HIGH-POTENCY PO Take 1 tablet by mouth daily.   ENZYME DIGEST PO Take 1 capsule by mouth at bedtime.   fluticasone 50 MCG/ACT nasal spray Commonly known as: FLONASE Place 2 sprays into both nostrils daily as needed for allergies or rhinitis.   mesalamine 1.2 g EC tablet Commonly known as: LIALDA Take 4.8 g by  mouth daily with breakfast. What changed: Another medication with the same name was removed. Continue taking this medication, and follow the directions you see here.   metoprolol tartrate 25 MG tablet Commonly known as: LOPRESSOR TAKE 1 TABLET TWICE A DAY   nitroGLYCERIN 0.4 MG SL tablet Commonly known as: NITROSTAT Place 1 tablet (0.4 mg total) under the tongue every 5 (five) minutes as needed for chest pain.   omeprazole 20 MG capsule Commonly known as: PRILOSEC Take 20 mg by mouth daily.   PROBIOTIC PO Take 1 capsule by mouth daily.   Repatha SureClick 188 MG/ML Soaj Generic drug: Evolocumab INJECT 140MG  EVERY 2 WEEKS        History (reviewed): Past Medical History:  Diagnosis Date   AAA (abdominal aortic aneurysm) ([place])    gets abd [place] every 3 years to watch,last check 08/2018   Arthritis    BPH (benign prostatic hyperplasia)    CAD (coronary artery disease)    Colon polyps    Dyslipidemia    GERD (gastroesophageal reflux disease)    Heart murmur    Herniated disc    Hiatal hernia    History of kidney stones    HTN (hypertension)    Hyperlipidemia    OSA on CPAP    Tinnitus    Past Surgical History:  Procedure Laterality Date   COLONOSCOPY     CORONARY ARTERY BYPASS GRAFT  2006   LIMA to LAD, SVG to RCA, SVG to circumflex, SVG to diagonal    mole removed  2003   stomach   right knee surgery  2008   TONSILLECTOMY     TOTAL KNEE ARTHROPLASTY Right 04/27/2018   Procedure: TOTAL KNEE ARTHROPLASTY RIGHT;  Surgeon: Gaynelle Arabian, MD;  Location: [facility];  Service: Orthopedics;  Laterality: Right;  29min   TOTAL KNEE ARTHROPLASTY Left 05/31/2019   Procedure: TOTAL KNEE ARTHROPLASTY;  Surgeon: Gaynelle Arabian, MD;  Location: [facility];  Service: Orthopedics;  Laterality: Left;  32min   UPPER GI ENDOSCOPY     Family History  Problem Relation Age of Onset   Heart disease Father    Cancer Father        Bladder Cancer   Heart attack Father    Colon polyps Father     Hypertension Mother    Alzheimer's disease Mother    Stroke Mother        3-4    Diabetes Brother    Heart disease Brother    Arthritis Brother        bilateral knee replacement, back issues    Pneumonia Sister    GI problems Sister        esophogus stretched    Crohn's disease Son    Social History   Socioeconomic History   Marital status: Married    Spouse name: Constance Holster   Number of children: 2   Years of education: Not on file   Highest education level: Bachelor's degree (e.g., BA, AB, BS)  Occupational History   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 Retired    Investment banker, operational of Radio broadcast assistant Strain: Not on [employer] Insecurity: Not on file  Transportation Needs: Not on file  Physical Activity: Not on file  Stress: Not on file  Social Connections: Not on file    Activities of Daily Living In your present state of health, do you have any difficulty performing the following activities: 09/20/2020  Hearing? N  Vision? N  Comment Wearing glasses  Difficulty concentrating or making decisions? N  Walking or climbing stairs? N  Dressing or bathing? N  Doing errands, shopping? N  Preparing Food and eating ? N  Using the Toilet? N  In the past six months, have you accidently leaked urine? N  Do you have problems with loss of bowel control? N  Managing your Medications? N  Managing your Finances? N  Housekeeping or managing your Housekeeping? N  Some recent data might be hidden    Patient  Education/ Literacy How often do you need to have someone help you when you read instructions, pamphlets, or other written materials from your doctor or pharmacy?: 1 - Never What is the last grade level you completed in school?: Bachelor's Degree  Exercise Current Exercise Habits: Home exercise routine, Type of exercise: walking, Intensity: Mild  Diet Patient reports consuming 3 meals a day and 1 snack(s) a day Patient reports that his primary diet is: Regular Patient reports that she does have regular access to food.   Depression Screen PHQ 2/9 Scores 09/20/2020 08/30/2020 05/23/2020 05/19/2020 02/15/2020 09/20/2019 04/13/2019  PHQ - 2 Score 0 0 0 0 0 0 0  PHQ- 9 Score - - - - 0 - 0     Fall Risk Fall Risk  09/20/2020 08/30/2020 05/23/2020 05/19/2020 02/15/2020  Falls in the past year? 0 0 0 0 0  Number falls in past yr: - - - - -  Injury with Fall? - - - - -  Comment - - - - -     Objective:  [name] [name] seemed alert and oriented and he participated appropriately during our telephone visit.  Blood Pressure Weight BMI  BP Readings from Last 3 Encounters:  08/30/20 (!) 145/74  05/23/20 (!) 158/73  05/19/20 (!) 143/73   Wt Readings from Last 3 Encounters:  08/30/20 213 lb (96.6 kg)  05/23/20 214 lb (97.1 kg)  05/19/20 212 lb (96.2 kg)   BMI Readings from Last 1 Encounters:  08/30/20 28.10 kg/m    *Unable to obtain current vital signs, weight, and BMI due to telephone visit type  Hearing/Vision  [name] [name] did not seem to have difficulty with hearing/understanding during the telephone conversation Reports that he has had a formal eye exam by an eye care professional within the past year Reports that he has not had a formal hearing evaluation within the past year *Unable to fully assess hearing and vision during telephone visit type  Cognitive Function: 6CIT Screen 09/20/2020 09/20/2019 09/18/2018  What Year? 0 points 0 points 0 points  What month? 0 points 0 points 0 points   What time? 0 points 0 points 0 points  Count back from 20 0 points 0 points 0 points  Months in reverse 0 points 0 points 0 points  Repeat phrase 0 points 0 points 0 points  Total Score 0 0 0   (Normal:0-7, Significant for Dysfunction: >8)  Normal Cognitive Function Screening: Yes   Immunization & Health Maintenance Record Immunization History  Administered Date(s) Administered   Fluad Quad(high Dose 65+) 12/02/2018, 12/21/2019   Influenza, High Dose Seasonal PF 12/21/2015, 12/05/2016, 12/23/2017   Influenza,inj,Quad PF,6+ Mos 12/03/2012, 12/30/2013, 01/03/2015   Influenza,inj,quad, With Preservative 12/02/2016   PFIZER(Purple Top)SARS-COV-2 Vaccination 03/23/2019, 04/15/2019, 01/05/2020   Pneumococcal Conjugate-13 01/12/2014   Pneumococcal Polysaccharide-23 10/01/2012   Tdap 07/03/2010, 01/17/2011   Zoster Recombinat (Shingrix) 07/25/2016, 01/09/2017    Health Maintenance  Topic Date Due   COVID-19 Vaccine (4 - Booster for [place] series) 05/04/2020   Hepatitis C Screening  05/19/2021 (Originally 11/04/1958)   INFLUENZA VACCINE  10/02/2020   TETANUS/TDAP  01/16/2021   PNA vac Low Risk Adult  Completed   Zoster Vaccines- Shingrix  Completed   HPV VACCINES  Aged Out       Assessment  This is a routine wellness examination for [name] [name].  Health Maintenance: Due or Overdue Health Maintenance Due  Topic Date Due   COVID-19 Vaccine (4 - Booster for [employer] series) 05/04/2020    [name] [name] does not need a referral for Community Assistance: Care Management:   no Social Work:    no Prescription Assistance:  no Nutrition/Diabetes Education:  no   Plan:  Personalized Goals  Goals Addressed             This Visit's Progress    AWV       09/20/2020 AWV Goal: Fall Prevention  Over the next year, patient will decrease their risk for falls by: Using assistive devices, such as a cane or walker, as needed Identifying fall risks within their home and  correcting them by: Removing throw rugs Adding handrails to stairs or ramps Removing clutter and keeping a clear pathway throughout the home Increasing light, especially at night Adding shower handles/bars Raising toilet seat Identifying potential personal risk factors for falls: Medication side effects Incontinence/urgency Vestibular dysfunction Hearing loss Musculoskeletal disorders Neurological disorders Orthostatic hypotension         Personalized Health Maintenance & Screening Recommendations  Up to date at this time  Lung Cancer Screening Recommended: no (Low Dose CT Chest recommended if Age 57-80 years, 30 pack-year currently smoking OR have quit w/in past 15 years) Hepatitis C Screening recommended: no HIV Screening recommended: no  Advanced Directives: Written information was not prepared per patient's request.  Referrals & Orders No orders of the defined types were placed in this encounter.   Follow-up Plan Follow-up with [name], DO as planned  AVS printed and mailed to patient   I have personally reviewed and noted the following in the patient's chart:   Medical and social history Use of alcohol, tobacco or illicit drugs  Current medications and supplements Functional ability and status Nutritional status Physical activity Advanced directives List of other physicians Hospitalizations, surgeries, and ER visits in previous 12 months Vitals Screenings to include cognitive, depression, and falls Referrals and appointments  In addition, I have reviewed and discussed with [name] [name] certain preventive protocols, quality metrics, and best practice recommendations. A written personalized care plan for preventive  services as well as general preventive health recommendations is available and can be mailed to the patient at his request.      Lynnea Ferrier, LPN  [phone]

## 2020-09-20 NOTE — Patient Instructions (Signed)
  [place] Maintenance Summary and Written Plan of Care  Mr. Dralle ,  Thank you for allowing me to perform your Medicare Annual Wellness Visit and for your ongoing commitment to your health.   Health Maintenance & Immunization History Health Maintenance  Topic Date Due   Hepatitis C Screening  05/19/2021 (Originally 11/04/1958)   INFLUENZA VACCINE  10/02/2020   TETANUS/TDAP  01/16/2021   COVID-19 Vaccine  Completed   PNA vac Low Risk Adult  Completed   Zoster Vaccines- Shingrix  Completed   HPV VACCINES  Aged Out   Immunization History  Administered Date(s) Administered   Fluad Quad(high Dose 65+) 12/02/2018, 12/21/2019   Influenza, High Dose Seasonal PF 12/21/2015, 12/05/2016, 12/23/2017   Influenza,inj,Quad PF,6+ Mos 12/03/2012, 12/30/2013, 01/03/2015   Influenza,inj,quad, With Preservative 12/02/2016   Moderna SARS-COV2 Booster Vaccination 09/12/2020   PFIZER(Purple Top)SARS-COV-2 Vaccination 03/23/2019, 04/15/2019, 01/05/2020   Pneumococcal Conjugate-13 01/12/2014   Pneumococcal Polysaccharide-23 10/01/2012   Tdap 07/03/2010, 01/17/2011   Zoster Recombinat (Shingrix) 07/25/2016, 01/09/2017    These are the patient goals that we discussed:  Goals Addressed             This Visit's Progress    AWV       09/20/2020 AWV Goal: Fall Prevention  Over the next year, patient will decrease their risk for falls by: Using assistive devices, such as a cane or walker, as needed Identifying fall risks within their home and correcting them by: Removing throw rugs Adding handrails to stairs or ramps Removing clutter and keeping a clear pathway throughout the home Increasing light, especially at night Adding shower handles/bars Raising toilet seat Identifying potential personal risk factors for falls: Medication side effects Incontinence/urgency Vestibular dysfunction Hearing loss Musculoskeletal disorders Neurological disorders Orthostatic  hypotension           This is a list of Health Maintenance Items that are overdue or due now: There are no preventive care reminders to display for this patient.   Orders/Referrals Placed Today: No orders of the defined types were placed in this encounter.  (Contact our referral department at [phone] if you have not spoken with someone about your referral appointment within the next 5 days)    Follow-up Plan  Follow-up with [name], DO as planned

## 2020-11-16 DIAGNOSIS — K52832 Lymphocytic colitis: Secondary | ICD-10-CM | POA: Diagnosis not present

## 2020-12-11 ENCOUNTER — Other Ambulatory Visit: Payer: Self-pay

## 2020-12-11 ENCOUNTER — Ambulatory Visit (INDEPENDENT_AMBULATORY_CARE_PROVIDER_SITE_OTHER): Payer: Medicare Other

## 2020-12-11 DIAGNOSIS — Z23 Encounter for immunization: Secondary | ICD-10-CM | POA: Diagnosis not present

## 2020-12-19 ENCOUNTER — Telehealth: Payer: Self-pay | Admitting: Family Medicine

## 2020-12-19 NOTE — Telephone Encounter (Signed)
PT DISCONNECTED PHONE

## 2020-12-19 NOTE — Telephone Encounter (Signed)
What labs would you like ordered?

## 2020-12-19 NOTE — Telephone Encounter (Signed)
Had everything done 08/2020.  No repeats needed for next visit

## 2020-12-20 ENCOUNTER — Other Ambulatory Visit: Payer: Self-pay

## 2020-12-20 ENCOUNTER — Other Ambulatory Visit: Payer: Medicare Other

## 2020-12-20 ENCOUNTER — Other Ambulatory Visit: Payer: Self-pay | Admitting: Family Medicine

## 2020-12-20 DIAGNOSIS — E785 Hyperlipidemia, unspecified: Secondary | ICD-10-CM | POA: Diagnosis not present

## 2020-12-20 DIAGNOSIS — I10 Essential (primary) hypertension: Secondary | ICD-10-CM

## 2020-12-21 LAB — CMP14+EGFR
ALT: 18 IU/L (ref 0–44)
AST: 21 IU/L (ref 0–40)
Albumin/Globulin Ratio: 2.3 — ABNORMAL HIGH (ref 1.2–2.2)
Albumin: 4.5 g/dL (ref 3.7–4.7)
Alkaline Phosphatase: 68 IU/L (ref 44–121)
BUN/Creatinine Ratio: 18 (ref 10–24)
BUN: 16 mg/dL (ref 8–27)
Bilirubin Total: 0.5 mg/dL (ref 0.0–1.2)
CO2: 24 mmol/L (ref 20–29)
Calcium: 9.3 mg/dL (ref 8.6–10.2)
Chloride: 106 mmol/L (ref 96–106)
Creatinine, Ser: 0.9 mg/dL (ref 0.76–1.27)
Globulin, Total: 2 g/dL (ref 1.5–4.5)
Glucose: 102 mg/dL — ABNORMAL HIGH (ref 70–99)
Potassium: 4.2 mmol/L (ref 3.5–5.2)
Sodium: 143 mmol/L (ref 134–144)
Total Protein: 6.5 g/dL (ref 6.0–8.5)
eGFR: 87 mL/min/{1.73_m2} (ref >59)

## 2020-12-21 LAB — LIPID PANEL
Chol/HDL Ratio: 2.7 ratio (ref 0.0–5.0)
Cholesterol, Total: 128 mg/dL (ref 100–199)
HDL: 48 mg/dL (ref >39)
LDL Chol Calc (NIH): 63 mg/dL (ref 0–99)
Triglycerides: 89 mg/dL (ref 0–149)
VLDL Cholesterol Cal: 17 mg/dL (ref 5–40)

## 2020-12-26 ENCOUNTER — Ambulatory Visit (INDEPENDENT_AMBULATORY_CARE_PROVIDER_SITE_OTHER): Payer: Medicare Other | Admitting: Family Medicine

## 2020-12-26 ENCOUNTER — Encounter: Payer: Self-pay | Admitting: Family Medicine

## 2020-12-26 ENCOUNTER — Other Ambulatory Visit: Payer: Self-pay

## 2020-12-26 VITALS — BP 138/72 | HR 60 | Temp 97.8°F | Ht 73.0 in | Wt 219.0 lb

## 2020-12-26 DIAGNOSIS — I10 Essential (primary) hypertension: Secondary | ICD-10-CM

## 2020-12-26 DIAGNOSIS — G479 Sleep disorder, unspecified: Secondary | ICD-10-CM | POA: Diagnosis not present

## 2020-12-26 DIAGNOSIS — E785 Hyperlipidemia, unspecified: Secondary | ICD-10-CM

## 2020-12-26 DIAGNOSIS — F418 Other specified anxiety disorders: Secondary | ICD-10-CM

## 2020-12-26 MED ORDER — BUSPIRONE HCL 5 MG PO TABS: 5.0000 mg | ORAL_TABLET | Freq: Two times a day (BID) | ORAL | 2 refills | Status: DC

## 2020-12-26 NOTE — Progress Notes (Signed)
Subjective: UX:NATFT f/u PCP: [name], DO DDU:KGURKY [name] [name] is a 80 y.o. male presenting to clinic today for:  1.  Sleep difficulties Patient was seen back in June and at that time he was expressing difficulties with sleep.  He wanted to start melatonin and was advised to take no more than 10 mg nightly.  He is here for recheck.  He admits that he only started 3 mg only for short time as he was worried that this may lead to dementia.  Apparently his mother took a sleep aid and she ultimately developed Alzheimer's dementia.  He was not sure if perhaps this might be associated but did not want to risk it.  He also reports some increased worry.  He worries a lot about financial issues pertaining to the country and even his own home.  He wants to make sure that his wife is well taking care of if he ever passes away.  He was previously treated with buspirone by his previous PCP and wonders if perhaps this might be revisited as his wife does worry that he is a little too anxious.  He tries to engage in physical activity to keep himself healthy and take his mind off of things.  He notes that his sleep is often broken and he wakes up at 3 AM and cannot go back to sleep for about an hour.  2.  Hypertension with hyperlipidemia with history of CAD Compliant with all medications.  Just took his Repatha injection today.  Is closely followed by cardiology.  No chest pain, shortness of breath or dizziness.  He often worries about his heart even though he does a lot of trying to take care of himself.  ROS: Per HPI  Allergies  Allergen Reactions   Levofloxacin     Caused C- Diff   Crestor [Rosuvastatin] Other (See Comments)    Liver problems   Vytorin [Ezetimibe-Simvastatin] Other (See Comments)    Liver problems   Past Medical History:  Diagnosis Date   AAA (abdominal aortic aneurysm) ([place])    gets abd [place] every 3 years to watch,last check 08/2018   Arthritis    BPH (benign prostatic  hyperplasia)    CAD (coronary artery disease)    Colon polyps    Dyslipidemia    GERD (gastroesophageal reflux disease)    Heart murmur    Herniated disc    Hiatal hernia    History of kidney stones    HTN (hypertension)    Hyperlipidemia    OSA on CPAP    Tinnitus     Current Outpatient Medications:    aspirin EC 81 MG tablet, Take 81 mg by mouth daily. Swallow whole., Disp: , Rfl:    Cholecalciferol ([employer] VIT D 5000 HIGH-POTENCY PO), Take 1 tablet by mouth daily., Disp: , Rfl:    Digestive Enzymes (ENZYME DIGEST PO), Take 1 capsule by mouth at bedtime., Disp: , Rfl:    Evolocumab (REPATHA SURECLICK) 706 MG/ML SOAJ, INJECT 140MG  EVERY 2 WEEKS, Disp: 6 mL, Rfl: 3   fluticasone (FLONASE) 50 MCG/ACT nasal spray, Place 2 sprays into both nostrils daily as needed for allergies or rhinitis., Disp: , Rfl:    mesalamine (LIALDA) 1.2 g EC tablet, Take 4.8 g by mouth daily with breakfast., Disp: , Rfl:    metoprolol tartrate (LOPRESSOR) 25 MG tablet, TAKE 1 TABLET TWICE A DAY, Disp: 180 tablet, Rfl: 3   nitroGLYCERIN (NITROSTAT) 0.4 MG SL tablet, Place 1 tablet (0.4  mg total) under the tongue every 5 (five) minutes as needed for chest pain., Disp: 25 tablet, Rfl: prn   omeprazole (PRILOSEC) 20 MG capsule, Take 20 mg by mouth daily., Disp: , Rfl:    Probiotic Product (PROBIOTIC PO), Take 1 capsule by mouth daily., Disp: , Rfl:  Social History   Socioeconomic History   Marital status: Married    Spouse name: Constance Holster   Number of children: 2   Years of education: Not on file   Highest education level: Bachelor's degree (e.g., BA, AB, BS)  Occupational History   Occupation: Armed forces technical officer for [employer]  Tobacco Use   Smoking status: Never   Smokeless tobacco: Never   Tobacco comments:    does not smoke   Vaping Use   Vaping Use: Never used  Substance and Sexual Activity   Alcohol use: Yes    Alcohol/week: 1.0 standard drink    Types: 1 Glasses of wine per week    Comment:  with meal   Drug use: No   Sexual activity: Yes  Other Topics Concern   Not on file  Social History Narrative   Married, 2 adult children and 4 grandchildren. Retired    Investment banker, operational of Sales executive: Not on [employer] Insecurity: Not on file  Transportation Needs: Not on file  Physical Activity: Not on file  Stress: Not on file  Social Connections: Not on file  Intimate Partner Violence: Not on file   Family History  Problem Relation Age of Onset   Heart disease Father    Cancer Father        Bladder Cancer   Heart attack Father    Colon polyps Father    Hypertension Mother    Alzheimer's disease Mother    Stroke Mother        3-4    Diabetes Brother    Heart disease Brother    Arthritis Brother        bilateral knee replacement, back issues    Pneumonia Sister    GI problems Sister        esophogus stretched    Crohn's disease Son     Objective: Office vital signs reviewed. BP 138/72   Pulse 60   Temp 97.8 F (36.6 C)   Ht 6\' 1"  (1.854 m)   Wt 219 lb (99.3 kg)   SpO2 97%   BMI 28.89 kg/m   Physical Examination:  General: Awake, alert, well nourished, No acute distress HEENT: Normal, sclera white, MMM; no carotid bruits Cardio: regular rate and rhythm, S1S2 heard, no murmurs appreciated Pulm: clear to auscultation bilaterally, no wheezes, rhonchi or rales; normal work of breathing on room air Psych: Very pleasant, interactive and talkative  Depression screen [facility] 2/9 12/26/2020 09/20/2020 08/30/2020  Decreased Interest 0 0 0  Down, Depressed, Hopeless 1 0 0  PHQ - 2 Score 1 0 0  Altered sleeping 1 - -  Tired, decreased energy 1 - -  Change in appetite 0 - -  Feeling bad or failure about yourself  0 - -  Trouble concentrating 0 - -  Moving slowly or fidgety/restless 0 - -  Suicidal thoughts 0 - -  PHQ-9 Score 3 - -  Difficult doing work/chores Somewhat difficult - -  Some recent data might be hidden   GAD 7 :  Generalized Anxiety Score 12/26/2020  Nervous, Anxious, on Edge 1  Control/stop worrying 1  Worry too much - different  things 1  Trouble relaxing 0  Restless 0  Easily annoyed or irritable 1  Afraid - awful might happen 1  Total GAD 7 Score 5  Anxiety Difficulty Somewhat difficult   Assessment/ Plan: 80 y.o. male   Sleep difficulties  Situational anxiety - Plan: busPIRone (BUSPAR) 5 MG tablet  Hyperlipidemia LDL goal <70  Essential hypertension  I encouraged him to proceed with melatonin if he has found that to be helpful.  We discussed that dose is less than 10 mg should be fine.  I certainly think that his sleep interruptions are contributing to some of the emotional instability.  Will place him on BuSpar 5 mg twice daily and titrate dose according to response.  Plan to follow-up in the next 6 to 12 weeks  Reviewed that his cholesterol was well maintained on Repatha.  I again reviewed the current guidelines for cholesterol checks and really do not think that he needs them more than once per year though I am happy to obtain a CMP twice per year since he is treated with PPI.  Continue Lopressor as prescribed.  Blood pressure was under good control today  No orders of the defined types were placed in this encounter.  No orders of the defined types were placed in this encounter.    [name], DO [place] [phone]

## 2020-12-26 NOTE — Telephone Encounter (Signed)
Patient has apt today - this encounter will be closed

## 2021-01-16 DIAGNOSIS — Z23 Encounter for immunization: Secondary | ICD-10-CM | POA: Diagnosis not present

## 2021-01-19 ENCOUNTER — Encounter: Payer: Self-pay | Admitting: Family Medicine

## 2021-01-19 ENCOUNTER — Other Ambulatory Visit: Payer: Self-pay | Admitting: Family Medicine

## 2021-01-19 DIAGNOSIS — F418 Other specified anxiety disorders: Secondary | ICD-10-CM

## 2021-01-19 MED ORDER — BUSPIRONE HCL 10 MG PO TABS: 10.0000 mg | ORAL_TABLET | Freq: Two times a day (BID) | ORAL | 3 refills | Status: DC

## 2021-02-05 NOTE — Progress Notes (Signed)
Cardiology Office Note   Date:  02/07/2021   ID:  Bralon, Antkowiak [birth year], MRN [id number]  PCP:  [name], DO  Cardiologist:   Minus Breeding, MD   Chief Complaint  Patient presents with   Coronary Artery Disease       History of Present Illness: [name] [name] is a 80 y.o. male who presents for evaluation of CAD/CABG.   He had an echo last year to evaluate a murmur that was most likely related to aortic sclerosis.   Since I last saw him he has done well.  He denies any cardiovascular symptoms in his chest pressure, neck or arm discomfort.  He has had no new shortness of breath.  He golfs routinely about 3 times per week and is 18 holes.  He denies any chest pressure, neck or arm discomfort.  He has had no new palpitations, presyncope or syncope.  He has had no weight gain or edema.  Past Medical History:  Diagnosis Date   AAA (abdominal aortic aneurysm)    gets abd [place] every 3 years to watch,last check 08/2018   Arthritis    BPH (benign prostatic hyperplasia)    CAD (coronary artery disease)    Colon polyps    Dyslipidemia    GERD (gastroesophageal reflux disease)    Heart murmur    Herniated disc    Hiatal hernia    History of kidney stones    HTN (hypertension)    Hyperlipidemia    OSA on CPAP    Tinnitus     Past Surgical History:  Procedure Laterality Date   COLONOSCOPY     CORONARY ARTERY BYPASS GRAFT  2006   LIMA to LAD, SVG to RCA, SVG to circumflex, SVG to diagonal    mole removed  2003   stomach   right knee surgery  2008   TONSILLECTOMY     TOTAL KNEE ARTHROPLASTY Right 04/27/2018   Procedure: TOTAL KNEE ARTHROPLASTY RIGHT;  Surgeon: Gaynelle Arabian, MD;  Location: [facility];  Service: Orthopedics;  Laterality: Right;  18min   TOTAL KNEE ARTHROPLASTY Left 05/31/2019   Procedure: TOTAL KNEE ARTHROPLASTY;  Surgeon: Gaynelle Arabian, MD;  Location: [facility];  Service: Orthopedics;  Laterality: Left;  24min   UPPER GI ENDOSCOPY        Current Outpatient Medications  Medication Sig Dispense Refill   aspirin EC 81 MG tablet Take 81 mg by mouth daily. Swallow whole.     busPIRone (BUSPAR) 10 MG tablet Take 1 tablet (10 mg total) by mouth 2 (two) times daily. 60 tablet 3   Cholecalciferol (CVS VIT D 5000 HIGH-POTENCY PO) Take 1 tablet by mouth daily.     Digestive Enzymes (ENZYME DIGEST PO) Take 1 capsule by mouth at bedtime.     Evolocumab (REPATHA SURECLICK) 092 MG/ML SOAJ INJECT 140MG  EVERY 2 WEEKS 6 mL 3   fluticasone (FLONASE) 50 MCG/ACT nasal spray Place 2 sprays into both nostrils daily as needed for allergies or rhinitis.     mesalamine (LIALDA) 1.2 g EC tablet Take 4.8 g by mouth every other day.     metoprolol tartrate (LOPRESSOR) 25 MG tablet TAKE 1 TABLET TWICE A DAY 180 tablet 3   omeprazole (PRILOSEC) 20 MG capsule Take 20 mg by mouth daily.     Probiotic Product (PROBIOTIC PO) Take 1 capsule by mouth daily.     Turmeric (QC TUMERIC COMPLEX PO) Take by mouth daily.     nitroGLYCERIN (  NITROSTAT) 0.4 MG SL tablet Place 1 tablet (0.4 mg total) under the tongue every 5 (five) minutes as needed for chest pain. 25 tablet prn   No current facility-administered medications for this visit.    Allergies:   Levofloxacin, Crestor [rosuvastatin], and Vytorin [ezetimibe-simvastatin]    ROS:  Please see the history of present illness.   Otherwise, review of systems are positive for none.   All other systems are reviewed and negative.    PHYSICAL EXAM: VS:  BP (!) 148/64   Pulse 62   Ht 6\' 1"  (1.854 m)   Wt 220 lb (99.8 kg)   BMI 29.03 kg/m  , BMI Body mass index is 29.03 kg/m. GENERAL:  Well appearing NECK:  No jugular venous distention, waveform within normal limits, carotid upstroke brisk and symmetric, soft bilateral carotid bruits versus transmitted systolic murmurs bruits, no thyromegaly LUNGS:  Clear to auscultation bilaterally CHEST:  Well healed sternotomy scar. HEART:  PMI not displaced or  sustained,S1 and S2 within normal limits, no S3, no S4, no clicks, no rubs, no murmurs ABD:  Flat, positive bowel sounds normal in frequency in pitch, no bruits, no rebound, no guarding, no midline pulsatile mass, no hepatomegaly, no splenomegaly EXT:  2 plus pulses throughout, no edema, no cyanosis no clubbing   EKG:  EKG is  ordered today. The ekg ordered today demonstrates sinus rhythm, rate 62, axis within normal limits, intervals within normal limits, no acute ST-T wave changes.   Recent Labs: 02/15/2020: TSH 2.180 08/28/2020: Hemoglobin 13.8; Platelets 155 12/20/2020: ALT 18; BUN 16; Creatinine, Ser 0.90; Potassium 4.2; Sodium 143    Lipid Panel    Component Value Date/Time   CHOL 128 12/20/2020 0840   CHOL 160 05/04/2013 0822   TRIG 89 12/20/2020 0840   TRIG 59 11/28/2016 0836   TRIG 92 05/04/2013 0822   HDL 48 12/20/2020 0840   HDL 54 11/28/2016 0836   HDL 55 05/04/2013 0822   CHOLHDL 2.7 12/20/2020 0840   LDLCALC 63 12/20/2020 0840   LDLCALC 149 (H) 08/05/2013 0851   LDLCALC 87 05/04/2013 0822      Wt Readings from Last 3 Encounters:  02/07/21 220 lb (99.8 kg)  12/26/20 219 lb (99.3 kg)  08/30/20 213 lb (96.6 kg)      Other studies Reviewed: Additional studies/ records that were reviewed today include:  Labs. Review of the above records demonstrates:  Please see elsewhere in the note.     ASSESSMENT AND PLAN:  AAA:  3.31 cm in June of 2021.   It was 2.7 x 3.1 this year.  No further imaging at this point I will check this in a couple years.  BRUIT: He has carotid bruits versus transmitted murmur and he will have carotid Dopplers   CAD:    He has had no new symptoms and will continue with risk reduction.   HTN:  The blood pressure is at target.  No change in therapy.   DYSLIPIDEMIA:   His LDL was 63 with an HDL of 48.  No change in therapy.     Current medicines are reviewed at length with the patient today.  The patient does not have concerns  regarding medicines.  The following changes have been made: None  Labs/ tests ordered today include:    Orders Placed This Encounter  Procedures   US Carotid Bilateral   EKG 12-Lead      Disposition:   FU with me in 12 months.  Signed, Minus Breeding, MD  02/07/2021 2:30 PM    [place]

## 2021-02-07 ENCOUNTER — Encounter: Payer: Self-pay | Admitting: Cardiology

## 2021-02-07 ENCOUNTER — Ambulatory Visit (INDEPENDENT_AMBULATORY_CARE_PROVIDER_SITE_OTHER): Payer: Medicare Other | Admitting: Cardiology

## 2021-02-07 ENCOUNTER — Other Ambulatory Visit: Payer: Self-pay

## 2021-02-07 VITALS — BP 148/64 | HR 62 | Ht 73.0 in | Wt 220.0 lb

## 2021-02-07 DIAGNOSIS — I10 Essential (primary) hypertension: Secondary | ICD-10-CM

## 2021-02-07 DIAGNOSIS — R0989 Other specified symptoms and signs involving the circulatory and respiratory systems: Secondary | ICD-10-CM

## 2021-02-07 DIAGNOSIS — E785 Hyperlipidemia, unspecified: Secondary | ICD-10-CM

## 2021-02-07 DIAGNOSIS — I714 Abdominal aortic aneurysm, without rupture, unspecified: Secondary | ICD-10-CM

## 2021-02-07 DIAGNOSIS — I2510 Atherosclerotic heart disease of native coronary artery without angina pectoris: Secondary | ICD-10-CM

## 2021-02-07 MED ORDER — NITROGLYCERIN 0.4 MG SL SUBL: 0.4000 mg | SUBLINGUAL_TABLET | SUBLINGUAL | 99 refills | Status: DC | PRN

## 2021-02-07 NOTE — Patient Instructions (Signed)
Medication Instructions:  The current medical regimen is effective;  continue present plan and medications.  *If you need a refill on your cardiac medications before your next appointment, please call your pharmacy*  Testing/Procedures: Your physician has requested that you have a carotid duplex. This test is an ultrasound of the carotid arteries in your neck. It looks at blood flow through these arteries that supply the brain with blood. Allow one hour for this exam. There are no restrictions or special instructions. This will be completed at [facility] and you will be contacted to be scheduled.  Follow-Up: At [facility], you and your health needs are our priority.  As part of our continuing mission to provide you with exceptional heart care, we have created designated Provider Care Teams.  These Care Teams include your primary Cardiologist (physician) and Advanced Practice Providers (APPs -  Physician Assistants and Nurse Practitioners) who all work together to provide you with the care you need, when you need it.  We recommend signing up for the patient portal called "MyChart".  Sign up information is provided on this After Visit Summary.  MyChart is used to connect with patients for Virtual Visits (Telemedicine).  Patients are able to view lab/test results, encounter notes, upcoming appointments, etc.  Non-urgent messages can be sent to your provider as well.   To learn more about what you can do with MyChart, go to [email or web address].    Your next appointment:   1 year(s)  The format for your next appointment:   In Person  Provider:   Minus Breeding, MD   Thank you for choosing [facility]!!

## 2021-02-15 ENCOUNTER — Other Ambulatory Visit: Payer: Self-pay

## 2021-02-15 ENCOUNTER — Ambulatory Visit (HOSPITAL_COMMUNITY)
Admission: RE | Admit: 2021-02-15 | Discharge: 2021-02-15 | Disposition: A | Discharge status: Home / Self Care | Payer: Medicare Other | Source: Ambulatory Visit | Attending: Cardiology | Admitting: Cardiology

## 2021-02-15 DIAGNOSIS — E782 Mixed hyperlipidemia: Secondary | ICD-10-CM | POA: Diagnosis not present

## 2021-02-15 DIAGNOSIS — I2510 Atherosclerotic heart disease of native coronary artery without angina pectoris: Secondary | ICD-10-CM | POA: Diagnosis not present

## 2021-02-15 DIAGNOSIS — E785 Hyperlipidemia, unspecified: Secondary | ICD-10-CM | POA: Diagnosis not present

## 2021-02-15 DIAGNOSIS — R0989 Other specified symptoms and signs involving the circulatory and respiratory systems: Secondary | ICD-10-CM | POA: Insufficient documentation

## 2021-02-15 DIAGNOSIS — I6523 Occlusion and stenosis of bilateral carotid arteries: Secondary | ICD-10-CM | POA: Diagnosis not present

## 2021-02-15 DIAGNOSIS — I10 Essential (primary) hypertension: Secondary | ICD-10-CM | POA: Diagnosis not present

## 2021-02-17 DIAGNOSIS — Z1152 Encounter for screening for COVID-19: Secondary | ICD-10-CM | POA: Diagnosis not present

## 2021-03-21 ENCOUNTER — Encounter: Payer: Self-pay | Admitting: Family Medicine

## 2021-03-22 ENCOUNTER — Other Ambulatory Visit: Payer: Self-pay

## 2021-03-22 ENCOUNTER — Other Ambulatory Visit: Payer: Medicare Other

## 2021-03-22 DIAGNOSIS — I10 Essential (primary) hypertension: Secondary | ICD-10-CM

## 2021-03-23 LAB — CMP14+EGFR
ALT: 26 IU/L (ref 0–44)
AST: 30 IU/L (ref 0–40)
Albumin/Globulin Ratio: 1.8 (ref 1.2–2.2)
Albumin: 4.2 g/dL (ref 3.7–4.7)
Alkaline Phosphatase: 67 IU/L (ref 44–121)
BUN/Creatinine Ratio: 22 (ref 10–24)
BUN: 20 mg/dL (ref 8–27)
Bilirubin Total: 0.6 mg/dL (ref 0.0–1.2)
CO2: 23 mmol/L (ref 20–29)
Calcium: 9.4 mg/dL (ref 8.6–10.2)
Chloride: 106 mmol/L (ref 96–106)
Creatinine, Ser: 0.92 mg/dL (ref 0.76–1.27)
Globulin, Total: 2.3 g/dL (ref 1.5–4.5)
Glucose: 104 mg/dL — ABNORMAL HIGH (ref 70–99)
Potassium: 4.2 mmol/L (ref 3.5–5.2)
Sodium: 143 mmol/L (ref 134–144)
Total Protein: 6.5 g/dL (ref 6.0–8.5)
eGFR: 85 mL/min/{1.73_m2} (ref >59)

## 2021-03-23 LAB — TSH: TSH: 2.46 u[IU]/mL (ref 0.450–4.500)

## 2021-03-27 ENCOUNTER — Ambulatory Visit (INDEPENDENT_AMBULATORY_CARE_PROVIDER_SITE_OTHER): Payer: Medicare Other | Admitting: Family Medicine

## 2021-03-27 ENCOUNTER — Encounter: Payer: Self-pay | Admitting: Family Medicine

## 2021-03-27 VITALS — BP 141/73 | HR 54 | Temp 97.0°F | Ht 73.0 in | Wt 217.8 lb

## 2021-03-27 DIAGNOSIS — M7522 Bicipital tendinitis, left shoulder: Secondary | ICD-10-CM | POA: Diagnosis not present

## 2021-03-27 DIAGNOSIS — E785 Hyperlipidemia, unspecified: Secondary | ICD-10-CM | POA: Diagnosis not present

## 2021-03-27 DIAGNOSIS — F418 Other specified anxiety disorders: Secondary | ICD-10-CM

## 2021-03-27 DIAGNOSIS — I10 Essential (primary) hypertension: Secondary | ICD-10-CM

## 2021-03-27 DIAGNOSIS — G479 Sleep disorder, unspecified: Secondary | ICD-10-CM | POA: Diagnosis not present

## 2021-03-27 MED ORDER — BUSPIRONE HCL 15 MG PO TABS: 15.0000 mg | ORAL_TABLET | Freq: Two times a day (BID) | ORAL | 3 refills | Status: DC

## 2021-03-27 NOTE — Progress Notes (Signed)
Subjective: CC: Follow-up hypertension, hyperlipidemia PCP: [name], DO WNI:OEVOJJ [name] [name] is a 82 y.o. male presenting to clinic today for:  1.  Hypertension with hyperlipidemia Patient had labs performed which showed normal renal function, liver enzymes and electrolytes.  He did have slight elevation of his sugar on that lab so we will plan for an A1c at his next visit.  He is compliant with Repatha, baby aspirin, Lopressor.  He has nitroglycerin on hand if needed.  No chest pain, shortness of breath, change in exercise tolerance but he admits that he has gained some weight is actively working at a gym in efforts to get back down to a healthier weight.  His goal is around 210 pounds but ideally 200 pounds.  He feels like he eats a balanced diet but because he has not been as physically active he has gained weight.  2.  Forearm pain Patient reports that he has been experiencing some pain in the left forearm that is specifically precipitated by curling his biceps or lifting something.  He does not recall any preceding injury.  He is right-hand dominant.  No specific treatments thus far but wanted to make mention of this.  3.  Anxiety disorder Patient reports that overall anxiety disorder is improving and he is sleeping better at least 4 hours per night.  He does not wish to go up on the buspirone to 15 mg twice daily however.   ROS: Per HPI  Allergies  Allergen Reactions   Levofloxacin     Caused C- Diff   Crestor [Rosuvastatin] Other (See Comments)    Liver problems   Vytorin [Ezetimibe-Simvastatin] Other (See Comments)    Liver problems   Past Medical History:  Diagnosis Date   AAA (abdominal aortic aneurysm)    gets abd [place] every 3 years to watch,last check 08/2018   Arthritis    BPH (benign prostatic hyperplasia)    CAD (coronary artery disease)    Colon polyps    Dyslipidemia    GERD (gastroesophageal reflux disease)    Heart murmur    Herniated disc     Hiatal hernia    History of kidney stones    HTN (hypertension)    Hyperlipidemia    OSA on CPAP    Tinnitus     Current Outpatient Medications:    aspirin EC 81 MG tablet, Take 81 mg by mouth daily. Swallow whole., Disp: , Rfl:    busPIRone (BUSPAR) 10 MG tablet, Take 1 tablet (10 mg total) by mouth 2 (two) times daily., Disp: 60 tablet, Rfl: 3   Cholecalciferol (CVS VIT D 5000 HIGH-POTENCY PO), Take 1 tablet by mouth daily., Disp: , Rfl:    Digestive Enzymes (ENZYME DIGEST PO), Take 1 capsule by mouth at bedtime., Disp: , Rfl:    Evolocumab (REPATHA SURECLICK) 009 MG/ML SOAJ, INJECT 140MG  EVERY 2 WEEKS, Disp: 6 mL, Rfl: 3   fluticasone (FLONASE) 50 MCG/ACT nasal spray, Place 2 sprays into both nostrils daily as needed for allergies or rhinitis., Disp: , Rfl:    mesalamine (LIALDA) 1.2 g EC tablet, Take 4.8 g by mouth every other day., Disp: , Rfl:    metoprolol tartrate (LOPRESSOR) 25 MG tablet, TAKE 1 TABLET TWICE A DAY, Disp: 180 tablet, Rfl: 3   nitroGLYCERIN (NITROSTAT) 0.4 MG SL tablet, Place 1 tablet (0.4 mg total) under the tongue every 5 (five) minutes as needed for chest pain., Disp: 25 tablet, Rfl: prn   omeprazole (PRILOSEC) 20  MG capsule, Take 20 mg by mouth daily., Disp: , Rfl:    Probiotic Product (PROBIOTIC PO), Take 1 capsule by mouth daily., Disp: , Rfl:    Turmeric (QC TUMERIC COMPLEX PO), Take by mouth daily., Disp: , Rfl:  Social History   Socioeconomic History   Marital status: Married    Spouse name: Constance Holster   Number of children: 2   Years of education: Not on file   Highest education level: Bachelor's degree (e.g., BA, AB, BS)  Occupational History   Occupation: Armed forces technical officer for [employer]  Tobacco Use   Smoking status: Never   Smokeless tobacco: Never   Tobacco comments:    does not smoke   Vaping Use   Vaping Use: Never used  Substance and Sexual Activity   Alcohol use: Yes    Alcohol/week: 1.0 standard drink    Types: 1 Glasses of  wine per week    Comment: with meal   Drug use: No   Sexual activity: Yes  Other Topics Concern   Not on file  Social History Narrative   Married, 2 adult children and 4 grandchildren. Retired    Investment banker, operational of Sales executive: Not on [employer] Insecurity: Not on file  Transportation Needs: Not on file  Physical Activity: Not on file  Stress: Not on file  Social Connections: Not on file  Intimate Partner Violence: Not on file   Family History  Problem Relation Age of Onset   Heart disease Father    Cancer Father        Bladder Cancer   Heart attack Father    Colon polyps Father    Hypertension Mother    Alzheimer's disease Mother    Stroke Mother        3-4    Diabetes Brother    Heart disease Brother    Arthritis Brother        bilateral knee replacement, back issues    Pneumonia Sister    GI problems Sister        esophogus stretched    Crohn's disease Son     Objective: Office vital signs reviewed. BP (!) 141/73    Pulse (!) 54    Temp (!) 97 F (36.1 C)    Ht 6\' 1"  (1.854 m)    Wt 217 lb 12.8 oz (98.8 kg)    BMI 28.74 kg/m   Physical Examination:  General: Awake, alert, well nourished, No acute distress HEENT: No carotid bruits.  Sclera white.  Moist mucous membranes.  TMs intact bilaterally with no cerumen within the external auditory canals Cardio: regular rate and rhythm, S1S2 heard, no murmurs appreciated Pulm: clear to auscultation bilaterally, no wheezes, rhonchi or rales; normal work of breathing on room air Extremities: warm, well perfused, No edema, cyanosis or clubbing; +2 pulses bilaterally MSK: normal gait and station  Left upper extremity: Tenderness palpation over the insertion point of the biceps.  There are no palpable deformities.  Pain is elicited with resisted flexion of the upper extremity.  Assessment/ Plan: 81 y.o. male   Situational anxiety - Plan: busPIRone (BUSPAR) 15 MG tablet  Sleep  difficulties  Hyperlipidemia LDL goal <70  Essential hypertension  Biceps tendinitis of left upper extremity  Increase BuSpar to 15 mg twice daily.  So far seems to be responding in a positive manner to this medication  Cholesterol is not yet due for checkup.  We reviewed his other labs which were  normal  Blood pressure is higher than his normal but does not require any medication changes at this time since it is less than 150/90.  I think that as he starts meeting his weight loss goals that we will see a general downtrend in his blood pressure.  Suspect that he is experiencing biceps tendinitis of that left upper extremity.  We discussed use of sports bands, ice and Tylenol if needed.  Could consider topical Biofreeze as a treatment as well. Home physical therapy exercises will be provided to the patient today  No orders of the defined types were placed in this encounter.  No orders of the defined types were placed in this encounter.    [name], DO [place] [phone]

## 2021-03-29 ENCOUNTER — Encounter: Payer: Self-pay | Admitting: Family Medicine

## 2021-03-30 LAB — PSA, TOTAL AND FREE
PSA, Free Pct: 14 %
PSA, Free: 0.28 ng/mL
Prostate Specific Ag, Serum: 2 ng/mL (ref 0.0–4.0)

## 2021-03-30 LAB — SPECIMEN STATUS REPORT

## 2021-04-04 NOTE — Progress Notes (Signed)
HPI male never smoker followed for OSA, complicated by CAD/CABG, HBP, hiatal hernia Unattended Home Sleep Test 05/09/14- AHI 24.9/ hr, desat to 85%, body weight 208 pounds Unattended Home Sleep Test 08/02/15-AHI 11.3/hour, desaturation to 76%, body weight 212.5 pounds -----------------------------------------------------------   04/06/20-  81 year old male never smoker followed for OSA, Insomniacomplicated by CAD/CABG, HBP, hiatal hernia, BPH, CPAP auto 5-12/[employer] Download- compliance 97%, AHI 0.9/ hr Body weight today- 212 lbs Covid vax- 3 Phizer Flu vax-had ------Patient states that machine is working good, has not been sleeping good lately but family pet has cancer so wakes them up. He does well with CPAP. Hde has gotten into a pattern of waking with busy brain life-issues around 4 AM. Plays a simple computer game for awhile then often can go back to sleep.  Trying melatonin 3 mg.  CXR 08/17/19- IMPRESSION: 1.  Prior CABG.  Heart size stable. 2. Stable calcified nodule left lower lung, most likely a granuloma. No acute pulmonary abnormality identified.  04/05/21- 81 year old male never smoker followed for OSA, Insomnia complicated by CAD/CABG, HBP, hiatal hernia, BPH, CPAP auto 5-12/[employer] Download- compliance compliance 94%, AHI 1.2/ hr Body weight today-  Covid vax- 3 Phizer, 1 Moderna Flu vax-had -----Patient would like to get a new CPAP machine. Has some trouble sleeping at night. He is taking Buspar to try and help with that.  PCP has started Buspar and melatonin.  He is not sure how he feels about these yet.  Admits some anxiety about finances.  Download reviewed and comfort measures discussed.  ROS-see HPI     + = positive Constitutional:   No-   weight loss, night sweats, fevers, chills, fatigue, lassitude. HEENT:   No-  headaches, difficulty swallowing, tooth/dental problems, sore throat,       No-  sneezing, itching, ear ache, nasal congestion, post nasal drip,  CV:  No-    chest pain, orthopnea, PND, swelling in lower extremities, anasarca,                                                    dizziness, palpitations Resp: No-   shortness of breath with exertion or at rest.              No-   productive cough,  No non-productive cough,  No- coughing up of blood.              No-   change in color of mucus.  No- wheezing.   Skin: No-   rash or lesions. GI:  No-   heartburn, indigestion, abdominal pain, nausea, vomiting, diarrhea,                 change in bowel habits, loss of appetite GU: No-   dysuria, change in color of urine, no urgency or frequency.  No- flank pain. MS:  + joint pain or swelling.  No- decreased range of motion.  No- back pain. Neuro-     nothing unusual Psych:  No- change in mood or affect. No depression or anxiety.  No memory loss.  OBJ- Physical Exam   + = positive General- Alert, Oriented, Affect-appropriate, Distress- none acute, tall, not obese Skin- rash-none, lesions- none, excoriation- none Lymphadenopathy- none Head- atraumatic            Eyes- Gross vision intact, PERRLA, conjunctivae and secretions  clear            Ears- Hearing, canals-normal            Nose- Clear, no-Septal dev, mucus, polyps, erosion, perforation             Throat- Mallampati II-III , mucosa clear , drainage- none, tonsils- atrophic Neck- flexible , trachea midline, no stridor , thyroid nl, carotid no bruit Chest - symmetrical excursion , unlabored           Heart/CV- RRR ,  Murmur+ TR , no gallop  , no rub, nl s1 s2                           - JVD- none , edema- none, stasis changes- none, varices- none           Lung- clear to P&A, wheeze- none, cough- none , dullness-none, rub- none           Chest wall-  Abd-  Br/ Gen/ Rectal- Not done, not indicated Extrem- cyanosis- none, clubbing, none, atrophy- none, strength- nl,  Neuro- grossly intact to observation.

## 2021-04-05 ENCOUNTER — Ambulatory Visit: Payer: Medicare Other | Admitting: Internal Medicine

## 2021-04-05 ENCOUNTER — Encounter: Payer: Self-pay | Admitting: Internal Medicine

## 2021-04-05 ENCOUNTER — Other Ambulatory Visit: Payer: Self-pay

## 2021-04-05 VITALS — BP 140/70 | HR 51 | Temp 98.3°F | Ht 73.0 in | Wt 218.2 lb

## 2021-04-05 DIAGNOSIS — F5101 Primary insomnia: Secondary | ICD-10-CM | POA: Diagnosis not present

## 2021-04-05 DIAGNOSIS — R3915 Urgency of urination: Secondary | ICD-10-CM | POA: Diagnosis not present

## 2021-04-05 DIAGNOSIS — G4733 Obstructive sleep apnea (adult) (pediatric): Secondary | ICD-10-CM

## 2021-04-05 DIAGNOSIS — R311 Benign essential microscopic hematuria: Secondary | ICD-10-CM | POA: Diagnosis not present

## 2021-04-05 NOTE — Patient Instructions (Signed)
Order- DME [employer]- please replace old CPAP machine auto 5-12, mask of choice, humidifier, supplies, AirView/ card  Please call if we can help

## 2021-04-26 DIAGNOSIS — G4733 Obstructive sleep apnea (adult) (pediatric): Secondary | ICD-10-CM | POA: Diagnosis not present

## 2021-04-30 ENCOUNTER — Encounter: Payer: Self-pay | Admitting: Internal Medicine

## 2021-04-30 NOTE — Assessment & Plan Note (Signed)
He will continue working with BuSpar and melatonin for now.  We talked over a few alternatives that could be tried if appropriate later on.  Reviewed basics of sleep hygiene.

## 2021-04-30 NOTE — Assessment & Plan Note (Signed)
Benefits from CPAP with good compliance and control. Plan-replace old machine AutoPap 5-12

## 2021-05-11 ENCOUNTER — Encounter: Payer: Self-pay | Admitting: Family Medicine

## 2021-05-11 ENCOUNTER — Ambulatory Visit (INDEPENDENT_AMBULATORY_CARE_PROVIDER_SITE_OTHER): Payer: Medicare Other | Admitting: Family Medicine

## 2021-05-11 ENCOUNTER — Telehealth: Payer: Self-pay

## 2021-05-11 DIAGNOSIS — S90561A Insect bite (nonvenomous), right ankle, initial encounter: Secondary | ICD-10-CM | POA: Diagnosis not present

## 2021-05-11 DIAGNOSIS — W57XXXA Bitten or stung by nonvenomous insect and other nonvenomous arthropods, initial encounter: Secondary | ICD-10-CM

## 2021-05-11 MED ORDER — DOXYCYCLINE HYCLATE 100 MG PO TABS: ORAL_TABLET | ORAL | 0 refills | Status: DC

## 2021-05-11 NOTE — Progress Notes (Signed)
Telephone visit ? ?Subjective: ?LF:YBOF bite ?PCP: [name], DO ?BPZ:WCHENI Harriet Bollen is a 81 y.o. male calls for telephone consult today. Patient provides verbal consent for consult held via phone. ? ?Due to COVID-19 pandemic this visit was conducted virtually. This visit type was conducted due to national recommendations for restrictions regarding the COVID-19 Pandemic (e.g. social distancing, sheltering in place) in an effort to limit this patient's exposure and mitigate transmission in our community. All issues noted in this document were discussed and addressed.  A physical exam was not performed with this format.  ? ?Location of patient: home ?Location of provider: [facility] ?Others present for call: none ? ?1. Tick bite ?Patient reports a tick bite that was on his ankle, right.  It was attached for an unknown amount if time.  He found it because he was itching.  He denies target lesions.  No arthralgia, headache, nausea, rashes. ? ? ?ROS: Per HPI ? ?Allergies  ?Allergen Reactions  ? Levofloxacin   ?  Caused C- Diff  ? Crestor [Rosuvastatin] Other (See Comments)  ?  Liver problems  ? Vytorin [Ezetimibe-Simvastatin] Other (See Comments)  ?  Liver problems  ? ?Past Medical History:  ?Diagnosis Date  ? AAA (abdominal aortic aneurysm)   ? gets abd [place] every 3 years to watch,last check 08/2018  ? Arthritis   ? BPH (benign prostatic hyperplasia)   ? CAD (coronary artery disease)   ? Colon polyps   ? Dyslipidemia   ? GERD (gastroesophageal reflux disease)   ? Heart murmur   ? Herniated disc   ? Hiatal hernia   ? History of kidney stones   ? HTN (hypertension)   ? Hyperlipidemia   ? OSA on CPAP   ? Tinnitus   ? ? ?Current Outpatient Medications:  ?  aspirin EC 81 MG tablet, Take 81 mg by mouth daily. Swallow whole., Disp: , Rfl:  ?  busPIRone (BUSPAR) 15 MG tablet, Take 1 tablet (15 mg total) by mouth 2 (two) times daily., Disp: 180 tablet, Rfl: 3 ?  Cholecalciferol ([employer] VIT D 5000 HIGH-POTENCY PO), Take 1  tablet by mouth daily., Disp: , Rfl:  ?  Digestive Enzymes (ENZYME DIGEST PO), Take 1 capsule by mouth at bedtime., Disp: , Rfl:  ?  Evolocumab (REPATHA SURECLICK) 778 MG/ML SOAJ, INJECT '140MG'$  EVERY 2 WEEKS, Disp: 6 mL, Rfl: 3 ?  fluticasone (FLONASE) 50 MCG/ACT nasal spray, Place 2 sprays into both nostrils daily as needed for allergies or rhinitis., Disp: , Rfl:  ?  mesalamine (LIALDA) 1.2 g EC tablet, Take 4.8 g by mouth every other day., Disp: , Rfl:  ?  metoprolol tartrate (LOPRESSOR) 25 MG tablet, TAKE 1 TABLET TWICE A DAY, Disp: 180 tablet, Rfl: 3 ?  nitroGLYCERIN (NITROSTAT) 0.4 MG SL tablet, Place 1 tablet (0.4 mg total) under the tongue every 5 (five) minutes as needed for chest pain., Disp: 25 tablet, Rfl: prn ?  omeprazole (PRILOSEC) 20 MG capsule, Take 20 mg by mouth daily., Disp: , Rfl:  ?  Probiotic Product (PROBIOTIC PO), Take 1 capsule by mouth daily., Disp: , Rfl:  ?  Turmeric (QC TUMERIC COMPLEX PO), Take by mouth daily., Disp: , Rfl:  ? ?Assessment/ Plan: ?81 y.o. male  ? ?Tick bite of left ankle, initial encounter - Plan: doxycycline (VIBRA-TABS) 100 MG tablet ? ?Will empirically treat patient with prophylactic dose of doxycycline since he is unsure as to how long that tick was on him.  I have  given him a few future doses should he need them since he will be in the same property that he sustained this tick bite.  We discussed red flag signs and symptoms warranting further evaluation and he voiced good understanding.  He will follow-up as needed ? ?Start time: 1:13pm ?End time: 1:18pm ? ?Total time spent on patient care (including telephone call/ virtual visit): 5 minutes ? ?[name], DO ?[place] ?[phone] ? ?

## 2021-05-11 NOTE — Telephone Encounter (Signed)
Patient left message regarding renewing [place].  Please call to discuss.  Thank you ?

## 2021-05-14 NOTE — Telephone Encounter (Signed)
Pt called to inquire about the healthwell grant and I instructed the pt that they are still active w/current grant and to call on 4/22. Pt voiced understanding.  ?

## 2021-05-16 DIAGNOSIS — G4733 Obstructive sleep apnea (adult) (pediatric): Secondary | ICD-10-CM | POA: Diagnosis not present

## 2021-05-21 ENCOUNTER — Other Ambulatory Visit: Payer: Self-pay | Admitting: Cardiology

## 2021-05-24 DIAGNOSIS — G4733 Obstructive sleep apnea (adult) (pediatric): Secondary | ICD-10-CM | POA: Diagnosis not present

## 2021-05-29 NOTE — Telephone Encounter (Signed)
Healthwell grant approved and given to pt: ?PATIENT ?[name] [name] ?  ?STATUS  ?Renewal ?  ?START DATE ?06/24/2021 ?  ?END DATE ?06/24/2022 ?  ?ASSISTANCE TYPE ?Co-pay ?  ?PAID ?$0.00 ?  ?PENDING ?$0.00 ?  ?BALANCE ?$2500.00 ?Pharmacy Card ?CARD NO. ?[id number] ?  ?CARD STATUS ?Active ?  ?BIN ?[id number] ?  ?PCN ?[id number] ?  ?PC GROUP ?[id number] ?  ?HELP DESK ?[phone] ?  ?PROVIDER ?PDMI ?  ?PROCESSOR ?PDMI ?

## 2021-06-18 DIAGNOSIS — G4733 Obstructive sleep apnea (adult) (pediatric): Secondary | ICD-10-CM | POA: Diagnosis not present

## 2021-06-21 ENCOUNTER — Other Ambulatory Visit: Payer: Self-pay | Admitting: Cardiology

## 2021-06-24 DIAGNOSIS — G4733 Obstructive sleep apnea (adult) (pediatric): Secondary | ICD-10-CM | POA: Diagnosis not present

## 2021-06-29 ENCOUNTER — Encounter: Payer: Self-pay | Admitting: Family

## 2021-06-29 ENCOUNTER — Encounter: Payer: Self-pay | Admitting: Family Medicine

## 2021-06-29 ENCOUNTER — Ambulatory Visit (INDEPENDENT_AMBULATORY_CARE_PROVIDER_SITE_OTHER): Payer: Medicare Other | Admitting: Family

## 2021-06-29 DIAGNOSIS — L239 Allergic contact dermatitis, unspecified cause: Secondary | ICD-10-CM

## 2021-06-29 MED ORDER — TRIAMCINOLONE ACETONIDE 0.5 % EX OINT: 1.0000 "application " | TOPICAL_OINTMENT | Freq: Two times a day (BID) | CUTANEOUS | 0 refills | Status: DC

## 2021-06-29 MED ORDER — PREDNISONE 10 MG (21) PO TBPK: ORAL_TABLET | ORAL | 0 refills | Status: DC

## 2021-06-29 NOTE — Progress Notes (Signed)
? ?Virtual Visit  Note ?Due to COVID-19 pandemic this visit was conducted virtually. This visit type was conducted due to national recommendations for restrictions regarding the COVID-19 Pandemic (e.g. social distancing, sheltering in place) in an effort to limit this patient's exposure and mitigate transmission in our community. All issues noted in this document were discussed and addressed.  A physical exam was not performed with this format. ? ?I connected with [name] [name] on 06/29/21 at 4:35 pm  by telephone and verified that I am speaking with the correct person using two identifiers. [name] is currently located at home and no one is currently with him during visit. The provider, Evelina Dun, FNP is located in their office at time of visit. ? ?I discussed the limitations, risks, security and privacy concerns of performing an evaluation and management service by telephone and the availability of in person appointments. I also discussed with the patient that there may be a patient responsible charge related to this service. The patient expressed understanding and agreed to proceed. ? ?[name],you are scheduled for a virtual visit with your provider today.   ? ?Just as we do with appointments in the office, we must obtain your consent to participate.  Your consent will be active for this visit and any virtual visit you may have with one of our providers in the next 365 days.   ? ?If you have a MyChart account, I can also send a copy of this consent to you electronically.  All virtual visits are billed to your insurance company just like a traditional visit in the office.  As this is a virtual visit, video technology does not allow for your provider to perform a traditional examination.  This may limit your provider's ability to fully assess your condition.  If your provider identifies any concerns that need to be evaluated in person or the need to arrange testing such as labs, EKG, etc,  we will make arrangements to do so.   ? ?Although advances in technology are sophisticated, we cannot ensure that it will always work on either your end or our end.  If the connection with a video visit is poor, we may have to switch to a telephone visit.  With either a video or telephone visit, we are not always able to ensure that we have a secure connection.   I need to obtain your verbal consent now.   Are you willing to proceed with your visit today?  ? ?[name] has provided verbal consent on 06/29/2021 for a virtual visit (video or telephone). ? ? ?Evelina Dun, FNP ?06/29/2021  4:32 PM ? ? ?History and Present Illness: ? ?Rash ?This is a new problem. The affected locations include the left arm, groin, left hand, right arm and right axilla. The rash is characterized by itchiness and redness. He was exposed to nothing. Past treatments include anti-itch cream. The treatment provided mild relief.  ? ? ? ?Review of Systems  ?Skin:  Positive for rash.  ?All other systems reviewed and are negative. ? ? ?Observations/Objective: ?No SOB or distress noted  ? ?Assessment and Plan: ?1. Allergic contact dermatitis, unspecified trigger ?Avoid scratching  ?Avoid allergens  ?Start prednisone  ?Kenalog as needed  ?- predniSONE (STERAPRED UNI-PAK 21 TAB) 10 MG (21) TBPK tablet; Use as directed  Dispense: 21 tablet; Refill: 0 ?- triamcinolone ointment (KENALOG) 0.5 %; Apply 1 application. topically 2 (two) times daily.  Dispense: 30 g; Refill: 0 ? ?  ?  I discussed the assessment and treatment plan with the patient. The patient was provided an opportunity to ask questions and all were answered. The patient agreed with the plan and demonstrated an understanding of the instructions. ?  ?The patient was advised to call back or seek an in-person evaluation if the symptoms worsen or if the condition fails to improve as anticipated. ? ?The above assessment and management plan was discussed with the patient. The patient  verbalized understanding of and has agreed to the management plan. Patient is aware to call the clinic if symptoms persist or worsen. Patient is aware when to return to the clinic for a follow-up visit. Patient educated on when it is appropriate to go to the emergency department.  ? ?Time call ended: 4:46 pm  ? ?I provided 11 minutes of  non face-to-face time during this encounter. ? ? ? ?Evelina Dun, FNP ? ? ?

## 2021-07-05 ENCOUNTER — Encounter: Payer: Self-pay | Admitting: Family Medicine

## 2021-07-16 ENCOUNTER — Other Ambulatory Visit: Payer: Self-pay | Admitting: Cardiology

## 2021-07-23 ENCOUNTER — Telehealth: Payer: Self-pay | Admitting: Family Medicine

## 2021-07-23 DIAGNOSIS — I10 Essential (primary) hypertension: Secondary | ICD-10-CM

## 2021-07-23 DIAGNOSIS — Z8052 Family history of malignant neoplasm of bladder: Secondary | ICD-10-CM

## 2021-07-23 DIAGNOSIS — E785 Hyperlipidemia, unspecified: Secondary | ICD-10-CM

## 2021-07-23 NOTE — Telephone Encounter (Signed)
LM to RC  

## 2021-07-23 NOTE — Telephone Encounter (Signed)
Orders placed.

## 2021-07-24 ENCOUNTER — Ambulatory Visit (INDEPENDENT_AMBULATORY_CARE_PROVIDER_SITE_OTHER): Payer: Medicare Other | Admitting: Family Medicine

## 2021-07-24 ENCOUNTER — Other Ambulatory Visit: Payer: Medicare Other

## 2021-07-24 ENCOUNTER — Telehealth: Payer: Self-pay | Admitting: Family Medicine

## 2021-07-24 ENCOUNTER — Encounter: Payer: Self-pay | Admitting: Family Medicine

## 2021-07-24 VITALS — BP 143/77 | HR 53 | Temp 97.5°F | Ht 73.0 in | Wt 217.0 lb

## 2021-07-24 DIAGNOSIS — I10 Essential (primary) hypertension: Secondary | ICD-10-CM | POA: Diagnosis not present

## 2021-07-24 DIAGNOSIS — Z8052 Family history of malignant neoplasm of bladder: Secondary | ICD-10-CM | POA: Diagnosis not present

## 2021-07-24 DIAGNOSIS — E785 Hyperlipidemia, unspecified: Secondary | ICD-10-CM

## 2021-07-24 DIAGNOSIS — F418 Other specified anxiety disorders: Secondary | ICD-10-CM | POA: Diagnosis not present

## 2021-07-24 DIAGNOSIS — M79652 Pain in left thigh: Secondary | ICD-10-CM | POA: Diagnosis not present

## 2021-07-24 DIAGNOSIS — S90562A Insect bite (nonvenomous), left ankle, initial encounter: Secondary | ICD-10-CM

## 2021-07-24 DIAGNOSIS — M79651 Pain in right thigh: Secondary | ICD-10-CM | POA: Diagnosis not present

## 2021-07-24 DIAGNOSIS — G4733 Obstructive sleep apnea (adult) (pediatric): Secondary | ICD-10-CM | POA: Diagnosis not present

## 2021-07-24 LAB — URINALYSIS, COMPLETE
Bilirubin, UA: NEGATIVE
Glucose, UA: NEGATIVE
Ketones, UA: NEGATIVE
Leukocytes,UA: NEGATIVE
Nitrite, UA: NEGATIVE
Protein,UA: NEGATIVE
RBC, UA: NEGATIVE
Specific Gravity, UA: 1.01 (ref 1.005–1.030)
Urobilinogen, Ur: 0.2 mg/dL (ref 0.2–1.0)
pH, UA: 7 (ref 5.0–7.5)

## 2021-07-24 LAB — MICROSCOPIC EXAMINATION
Bacteria, UA: NONE SEEN
Epithelial Cells (non renal): NONE SEEN /hpf (ref 0–10)
RBC, Urine: NONE SEEN /hpf (ref 0–2)
Renal Epithel, UA: NONE SEEN /hpf
WBC, UA: NONE SEEN /hpf (ref 0–5)

## 2021-07-24 NOTE — Progress Notes (Signed)
Subjective: CC: 87-monthfollow-up PCP: GJanora Norlander DO HTIW:[id number] Bjorkmanis a 81y.o. male presenting to clinic today for:  1.  Anxiety disorder Patient reports good control of mood with BuSpar 15 mg twice daily.  Needs refills.  2.  Hypertension with hyperlipidemia Patient is compliant with medications.  No reports of chest pain, shortness of breath.  Had fasting labs drawn this morning.  He meant to come yesterday but he got caught up with golf and forgot.  3.  Thigh pain Patient reports he does occasionally get some thigh pain with associated weakness.  This seems to be after he squatted a lot.  He cites recently when he was bending up and down doing some yard work that the thighs seem to get weak on him.  He has a history of bilateral knee surgery.  He notes that he is "getting older" but wanted to make sure that this was nothing to be concerned about.  He feels that he is hydrating adequately.  Does not report any discoloration or swelling of the legs   ROS: Per HPI  Allergies  Allergen Reactions   Levofloxacin     Caused C- Diff   Crestor [Rosuvastatin] Other (See Comments)    Liver problems   Vytorin [Ezetimibe-Simvastatin] Other (See Comments)    Liver problems   Past Medical History:  Diagnosis Date   [place]    [place],last check 08/2018   Arthritis    BPH (benign prostatic hyperplasia)    CAD (coronary artery disease)    Colon polyps    Dyslipidemia    GERD (gastroesophageal reflux disease)    Heart murmur    Herniated disc    Hiatal hernia    History of kidney stones    HTN (hypertension)    Hyperlipidemia    OSA on CPAP    Tinnitus     Current Outpatient Medications:    aspirin EC 81 MG tablet, Take 81 mg by mouth daily. Swallow whole., Disp: , Rfl:    busPIRone (BUSPAR) 15 MG tablet, Take 1 tablet (15 mg total) by mouth 2 (two) times daily., Disp: 180 tablet, Rfl: 3    Cholecalciferol ([employer]S VIT D 5000 HIGH-POTENCY PO), Take 1 tablet by mouth daily., Disp: , Rfl:    Digestive Enzymes (ENZYME DIGEST PO), Take 1 capsule by mouth at bedtime., Disp: , Rfl:    doxycycline (VIBRA-TABS) 100 MG tablet, Take 2 tablets as a single dose to prevent lyme disease. May repeat with each tick bite., Disp: 10 tablet, Rfl: 0   Evolocumab (REPATHA SURECLICK) 1338MG/ML SOAJ, INJECT 1 ML ('140MG'$ ) EVERY 2 WEEKS AS DIRECTED, Disp: 6 mL, Rfl: 3   fluticasone (FLONASE) 50 MCG/ACT nasal spray, Place 2 sprays into both nostrils daily as needed for allergies or rhinitis., Disp: , Rfl:    mesalamine (LIALDA) 1.2 g EC tablet, Take 4.8 g by mouth every other day., Disp: , Rfl:    metoprolol tartrate (LOPRESSOR) 25 MG tablet, TAKE 1 TABLET TWICE A DAY, Disp: 180 tablet, Rfl: 3   omeprazole (PRILOSEC) 20 MG capsule, Take 20 mg by mouth daily., Disp: , Rfl:    Probiotic Product (PROBIOTIC PO), Take 1 capsule by mouth daily., Disp: , Rfl:    triamcinolone ointment (KENALOG) 0.5 %, Apply 1 application. topically 2 (two) times daily., Disp: 30 g, Rfl: 0   Turmeric (QC TUMERIC COMPLEX PO), Take by mouth daily., Disp: ,  Rfl:    nitroGLYCERIN (NITROSTAT) 0.4 MG SL tablet, Place 1 tablet (0.4 mg total) under the tongue every 5 (five) minutes as needed for chest pain., Disp: 25 tablet, Rfl: prn Social History   Socioeconomic History   Marital status: Married    Spouse name: Constance Holster   Number of children: 2   Years of education: Not on file   Highest education level: Bachelor's degree (e.g., BA, AB, BS)  Occupational History   Occupation: Armed forces technical officer for [employer]  Tobacco Use   Smoking status: Never   Smokeless tobacco: Never   Tobacco comments:    does not smoke   Vaping Use   Vaping Use: Never used  Substance and Sexual Activity   Alcohol use: Yes    Alcohol/week: 1.0 standard drink    Types: 1 Glasses of wine per week    Comment: with meal   Drug use: No   Sexual activity:  Yes  Other Topics Concern   Not on file  Social History Narrative   Married, 2 adult children and 4 grandchildren. Retired    Investment banker, operational of Sales executive: Not on [employer] Insecurity: Not on file  Transportation Needs: Not on file  Physical Activity: Not on file  Stress: Not on file  Social Connections: Not on file  Intimate Partner Violence: Not on file   Family History  Problem Relation Age of Onset   Heart disease Father    Cancer Father        Bladder Cancer   Heart attack Father    Colon polyps Father    Hypertension Mother    Alzheimer's disease Mother    Stroke Mother        3-4    Diabetes Brother    Heart disease Brother    Arthritis Brother        bilateral knee replacement, back issues    Pneumonia Sister    GI problems Sister        esophogus stretched    Crohn's disease Son     Objective: Office vital signs reviewed. BP (!) 143/77   Pulse (!) 53   Temp (!) 97.5 F (36.4 C)   Ht '6\' 1"'$  (1.854 m)   Wt 217 lb (98.4 kg)   SpO2 98%   BMI 28.63 kg/m   Physical Examination:  General: Awake, alert, well nourished, well-appearing elderly male, No acute distress HEENT: Sclera white.  Moist mucous membranes Cardio: regular rate and rhythm, S1S2 heard, no murmurs appreciated Pulm: clear to auscultation bilaterally, no wheezes, rhonchi or rales; normal work of breathing on room air Extremities: warm, well perfused, No edema, cyanosis or clubbing; +2 pulses bilaterally MSK: Normal gait and station.  Normal tone to the thigh and calf muscles.  He is ambulating independently.  He has post surgical changes to the bilateral knees.  No discoloration or swelling of the legs appreciated.  Varicose veins are present in the lower ankles  Assessment/ Plan: 81 y.o. male   Essential hypertension  Situational anxiety  Pain in both thighs  Blood pressure controlled for age.  No changes  Anxiety seems to be well controlled with BuSpar.   Continue current regimen  Suspect that the thigh pain is likely fatigability in the setting of squatting repeatedly.  We discussed adequate hydration.  No concerning features on exam to suggest any advanced imaging or referral needed at this time  No orders of the defined types were placed in  this encounter.  No orders of the defined types were placed in this encounter.    [name], DO [place] [phone]

## 2021-07-25 DIAGNOSIS — G4733 Obstructive sleep apnea (adult) (pediatric): Secondary | ICD-10-CM | POA: Diagnosis not present

## 2021-07-25 LAB — CMP14+EGFR
ALT: 18 IU/L (ref 0–44)
AST: 25 IU/L (ref 0–40)
Albumin/Globulin Ratio: 1.9 (ref 1.2–2.2)
Albumin: 4.4 g/dL (ref 3.7–4.7)
Alkaline Phosphatase: 62 IU/L (ref 44–121)
BUN/Creatinine Ratio: 19 (ref 10–24)
BUN: 18 mg/dL (ref 8–27)
Bilirubin Total: 0.7 mg/dL (ref 0.0–1.2)
CO2: 23 mmol/L (ref 20–29)
Calcium: 9.4 mg/dL (ref 8.6–10.2)
Chloride: 106 mmol/L (ref 96–106)
Creatinine, Ser: 0.95 mg/dL (ref 0.76–1.27)
Globulin, Total: 2.3 g/dL (ref 1.5–4.5)
Glucose: 96 mg/dL (ref 70–99)
Potassium: 4.3 mmol/L (ref 3.5–5.2)
Sodium: 142 mmol/L (ref 134–144)
Total Protein: 6.7 g/dL (ref 6.0–8.5)
eGFR: 82 mL/min/{1.73_m2} (ref >59)

## 2021-07-25 LAB — LIPID PANEL
Chol/HDL Ratio: 2.4 ratio (ref 0.0–5.0)
Cholesterol, Total: 123 mg/dL (ref 100–199)
HDL: 52 mg/dL (ref >39)
LDL Chol Calc (NIH): 57 mg/dL (ref 0–99)
Triglycerides: 69 mg/dL (ref 0–149)
VLDL Cholesterol Cal: 14 mg/dL (ref 5–40)

## 2021-07-25 LAB — CBC WITH DIFFERENTIAL/PLATELET
Basophils Absolute: 0 10*3/uL (ref 0.0–0.2)
Basos: 1 %
EOS (ABSOLUTE): 0.1 10*3/uL (ref 0.0–0.4)
Eos: 2 %
Hematocrit: 42.5 % (ref 37.5–51.0)
Hemoglobin: 14.3 g/dL (ref 13.0–17.7)
Immature Grans (Abs): 0 10*3/uL (ref 0.0–0.1)
Immature Granulocytes: 0 %
Lymphocytes Absolute: 1.6 10*3/uL (ref 0.7–3.1)
Lymphs: 31 %
MCH: 31.5 pg (ref 26.6–33.0)
MCHC: 33.6 g/dL (ref 31.5–35.7)
MCV: 94 fL (ref 79–97)
Monocytes Absolute: 0.4 10*3/uL (ref 0.1–0.9)
Monocytes: 8 %
Neutrophils Absolute: 2.9 10*3/uL (ref 1.4–7.0)
Neutrophils: 58 %
Platelets: 141 10*3/uL — ABNORMAL LOW (ref 150–450)
RBC: 4.54 x10E6/uL (ref 4.14–5.80)
RDW: 13.2 % (ref 11.6–15.4)
WBC: 5 10*3/uL (ref 3.4–10.8)

## 2021-07-27 MED ORDER — DOXYCYCLINE HYCLATE 100 MG PO TABS: 100.0000 mg | ORAL_TABLET | Freq: Two times a day (BID) | ORAL | 0 refills | Status: DC

## 2021-07-27 NOTE — Addendum Note (Signed)
Addended by: Everlean Cherry on: 07/27/2021 03:16 PM   Modules accepted: Orders

## 2021-07-27 NOTE — Telephone Encounter (Signed)
Pt called stating that when he talked to Dr Lajuana Ripple the other day at his appt about the tick bite he had in March, he told her that he never picked up any medicine for it at the pharmacy and Dr Lajuana Ripple told him to go pick it up. So pt went to the pharmacy to pick up the Doxycycline but the pharmacist [facility]) told him that they put it back on the shelf since it had been a while since they received the Rx and would have to send [place] an Rx request to fill it.   Please resend Rx.

## 2021-08-14 DIAGNOSIS — H52203 Unspecified astigmatism, bilateral: Secondary | ICD-10-CM | POA: Diagnosis not present

## 2021-08-14 DIAGNOSIS — H25813 Combined forms of age-related cataract, bilateral: Secondary | ICD-10-CM | POA: Diagnosis not present

## 2021-08-14 DIAGNOSIS — H524 Presbyopia: Secondary | ICD-10-CM | POA: Diagnosis not present

## 2021-08-14 DIAGNOSIS — H04123 Dry eye syndrome of bilateral lacrimal glands: Secondary | ICD-10-CM | POA: Diagnosis not present

## 2021-08-24 DIAGNOSIS — G4733 Obstructive sleep apnea (adult) (pediatric): Secondary | ICD-10-CM | POA: Diagnosis not present

## 2021-08-28 ENCOUNTER — Ambulatory Visit: Payer: Medicare Other | Admitting: Dermatology

## 2021-08-28 DIAGNOSIS — L570 Actinic keratosis: Secondary | ICD-10-CM

## 2021-08-28 DIAGNOSIS — Z1283 Encounter for screening for malignant neoplasm of skin: Secondary | ICD-10-CM | POA: Diagnosis not present

## 2021-08-31 ENCOUNTER — Ambulatory Visit (HOSPITAL_COMMUNITY)
Admission: RE | Admit: 2021-08-31 | Discharge: 2021-08-31 | Disposition: A | Discharge status: Home / Self Care | Payer: Medicare Other | Source: Ambulatory Visit | Attending: Cardiology | Admitting: Cardiology

## 2021-08-31 DIAGNOSIS — I723 Aneurysm of iliac artery: Secondary | ICD-10-CM

## 2021-09-18 DIAGNOSIS — G4733 Obstructive sleep apnea (adult) (pediatric): Secondary | ICD-10-CM | POA: Diagnosis not present

## 2021-09-20 DIAGNOSIS — K2100 Gastro-esophageal reflux disease with esophagitis, without bleeding: Secondary | ICD-10-CM | POA: Insufficient documentation

## 2021-09-20 DIAGNOSIS — K219 Gastro-esophageal reflux disease without esophagitis: Secondary | ICD-10-CM | POA: Insufficient documentation

## 2021-09-20 DIAGNOSIS — Z8371 Family history of colonic polyps: Secondary | ICD-10-CM | POA: Insufficient documentation

## 2021-09-20 DIAGNOSIS — D131 Benign neoplasm of stomach: Secondary | ICD-10-CM | POA: Insufficient documentation

## 2021-09-21 ENCOUNTER — Ambulatory Visit (INDEPENDENT_AMBULATORY_CARE_PROVIDER_SITE_OTHER): Payer: Medicare Other

## 2021-09-21 VITALS — Wt 216.0 lb

## 2021-09-21 DIAGNOSIS — Z0000 Encounter for general adult medical examination without abnormal findings: Secondary | ICD-10-CM

## 2021-09-21 NOTE — Progress Notes (Signed)
Subjective:   [name] [name] is a 81 y.o. male who presents for Medicare Annual/Subsequent preventive examination.  Virtual Visit via Telephone Note  I connected with  [name] [name] on 09/21/21 at  3:30 PM EDT by telephone and verified that I am speaking with the correct person using two identifiers.  Location: Patient: Home Provider: [facility] Persons participating in the virtual visit: patient/Nurse Health Advisor   I discussed the limitations, risks, security and privacy concerns of performing an evaluation and management service by telephone and the availability of in person appointments. The patient expressed understanding and agreed to proceed.  Interactive audio and video telecommunications were attempted between this nurse and patient, however failed, due to patient having technical difficulties OR patient did not have access to video capability.  We continued and completed visit with audio only.  Some vital signs may be absent or patient reported.   Addilynn Mowrer E Malorie Bigford, LPN   Review of Systems     Cardiac Risk Factors include: advanced age (>44mn, >>74women);dyslipidemia;hypertension;male gender;Other (see comment), Risk factor comments: OSA, h/o CABG, atherosclerosis, AAA     Objective:    Today's Vitals   09/21/21 1529  Weight: 216 lb (98 kg)   Body mass index is 28.5 kg/m.     09/21/2021    3:36 PM 09/20/2020    2:14 PM 09/20/2019   11:10 AM 06/01/2019    3:54 PM 05/31/2019    3:09 PM 05/24/2019   11:19 AM 09/18/2018    2:19 PM  Advanced Directives  Does Patient Have a Medical Advance Directive? Yes Yes Yes Yes Yes Yes Yes  Type of AParamedicof ARussellLiving will [place]huttenLiving will [place]Living will Living will;Healthcare Power of [place]Living will [place]m [place]Living will Living will;Healthcare Power of Attorney  Does patient want to make  changes to medical advance directive?  No - Patient declined No - Patient declined  No - Patient declined No - Patient declined No - Patient declined  Copy of [place] Chart? Yes - validated most recent copy scanned in chart (See row information) Yes - validated most recent copy scanned in chart (See row information) No - copy requested No - copy requested No - copy requested No - copy requested No - copy requested    Current Medications (verified) Outpatient Encounter Medications as of 09/21/2021  Medication Sig   aspirin EC 81 MG tablet Take 81 mg by mouth daily. Swallow whole.   busPIRone (BUSPAR) 15 MG tablet Take 1 tablet (15 mg total) by mouth 2 (two) times daily.   Cholecalciferol ([employer] VIT D 5000 HIGH-POTENCY PO) Take 1 tablet by mouth daily.   Digestive Enzymes (ENZYME DIGEST PO) Take 1 capsule by mouth at bedtime.   Evolocumab (REPATHA SURECLICK) 1220MG/ML SOAJ INJECT 1 ML ('140MG'$ ) EVERY 2 WEEKS AS DIRECTED   fluticasone (FLONASE) 50 MCG/ACT nasal spray Place 2 sprays into both nostrils daily as needed for allergies or rhinitis.   mesalamine (LIALDA) 1.2 g EC tablet Take 4.8 g by mouth every other day.   metoprolol tartrate (LOPRESSOR) 25 MG tablet TAKE 1 TABLET TWICE A DAY   omeprazole (PRILOSEC) 20 MG capsule Take 20 mg by mouth daily.   Probiotic Product (PROBIOTIC PO) Take 1 capsule by mouth daily.   triamcinolone ointment (KENALOG) 0.5 % Apply 1 application. topically 2 (two) times daily.   Turmeric (QC TUMERIC COMPLEX PO) Take  by mouth daily.   nitroGLYCERIN (NITROSTAT) 0.4 MG SL tablet Place 1 tablet (0.4 mg total) under the tongue every 5 (five) minutes as needed for chest pain.   [DISCONTINUED] doxycycline (VIBRA-TABS) 100 MG tablet Take 1 tablet (100 mg total) by mouth 2 (two) times daily. 1 po bid   No facility-administered encounter medications on file as of 09/21/2021.    Allergies (verified) Levofloxacin, Crestor [rosuvastatin], and Vytorin  [ezetimibe-simvastatin]   History: Past Medical History:  Diagnosis Date   AAA (abdominal aortic aneurysm) ([place])    gets abd [place] every 3 years to watch,last check 08/2018   Arthritis    BPH (benign prostatic hyperplasia)    CAD (coronary artery disease)    Colon polyps    Dyslipidemia    GERD (gastroesophageal reflux disease)    Heart murmur    Herniated disc    Hiatal hernia    History of kidney stones    HTN (hypertension)    Hyperlipidemia    OSA on CPAP    Tinnitus    Past Surgical History:  Procedure Laterality Date   COLONOSCOPY     CORONARY ARTERY BYPASS GRAFT  2006   LIMA to LAD, SVG to RCA, SVG to circumflex, SVG to diagonal    mole removed  2003   stomach   right knee surgery  2008   TONSILLECTOMY     TOTAL KNEE ARTHROPLASTY Right 04/27/2018   Procedure: TOTAL KNEE ARTHROPLASTY RIGHT;  Surgeon: Gaynelle Arabian, MD;  Location: [facility];  Service: Orthopedics;  Laterality: Right;  100mn   TOTAL KNEE ARTHROPLASTY Left 05/31/2019   Procedure: TOTAL KNEE ARTHROPLASTY;  Surgeon: AGaynelle Arabian MD;  Location: [facility];  Service: Orthopedics;  Laterality: Left;  531m   UPPER GI ENDOSCOPY     Family History  Problem Relation Age of Onset   Heart disease Father    Cancer Father        Bladder Cancer   Heart attack Father    Colon polyps Father    Hypertension Mother    Alzheimer's disease Mother    Stroke Mother        3-4    Diabetes Brother    Heart disease Brother    Arthritis Brother        bilateral knee replacement, back issues    Pneumonia Sister    GI problems Sister        esophogus stretched    Crohn's disease Son    Social History   Socioeconomic History   Marital status: Married    Spouse name: NoConstance Holster Number of children: 2   Years of education: Not on file   Highest education level: Bachelor's degree (e.g., BA, AB, BS)  Occupational History   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 Retired    SoScientist, physiologicaltrain: Low Risk  (09/21/2021)   Overall  Financial Resource Strain (CARDIA)    Difficulty of Paying Living Expenses: Not hard at all  Food Insecurity: No Food Insecurity (09/21/2021)   Hunger Vital Sign    Worried About Running Out of Food in the Last Year: Never true    Ran Out of Food in the Last Year: Never true  Transportation Needs: No Transportation Needs (09/21/2021)   PRAPARE - Hydrologist (Medical): No    Lack of Transportation (Non-Medical): No  Physical Activity: Sufficiently Active (09/21/2021)   Exercise Vital Sign    Days of Exercise per Week: 5 days    Minutes of Exercise per Session: 50 min  Stress: No Stress Concern Present (09/21/2021)   [place]    Feeling of Stress : Not at all  Social Connections: [place] (09/21/2021)   Social Connection and Isolation Panel [NHANES]    Frequency of Communication with Friends and Family: More than three times a week    Frequency of Social Gatherings with Friends and Family: More than three times a week    Attends Religious Services: More than 4 times per year    Active Member of [employer] or Organizations: Yes    Attends Music therapist: More than 4 times per year    Marital Status: Married    Tobacco Counseling Counseling given: Not Answered Tobacco comments: does not smoke    Clinical Intake:  Pre-visit preparation completed: Yes  Pain  : No/denies pain     BMI - recorded: 28.5 Nutritional Status: BMI 25 -29 Overweight Nutritional Risks: None Diabetes: No  How often do you need to have someone help you when you read instructions, pamphlets, or other written materials from your doctor or pharmacy?: 1 - Never  Diabetic?no  Interpreter Needed?: No  Information entered by :: Josecarlos Harriott, LPN   Activities of Daily Living    09/21/2021    3:36 PM  In your present state of health, do you have any difficulty performing the following activities:  Hearing? 0  Vision? 0  Difficulty concentrating or making decisions? 0  Walking or climbing stairs? 0  Dressing or bathing? 0  Doing errands, shopping? 0  Preparing Food and eating ? N  Using the Toilet? N  In the past six months, have you accidently leaked urine? Y  Comment urge incontinence  Do you have problems with loss of bowel control? N  Managing your Medications? N  Managing your Finances? N  Housekeeping or managing your Housekeeping? N    Patient Care Team: [name], DO as PCP - General (Family Medicine) Minus Breeding, MD as PCP - Cardiology (Cardiology) Rutherford Guys, MD as Consulting Physician (Ophthalmology) Clarene Essex, MD as Consulting Physician (Gastroenterology) Irine Seal, MD as Attending Physician (Urology) Gaynelle Arabian, MD as Consulting Physician (Orthopedic Surgery) Lavonna Monarch, MD as Consulting Physician (Dermatology) Lavera Guise, [facility] (Pharmacist)  Indicate any recent Medical Services you may have received from other than [facility] providers in the past year (date may be approximate).     Assessment:   This is a routine wellness examination for [place].  Hearing/Vision screen Hearing Screening - Comments:: Denies hearing difficulties   Vision Screening - Comments:: Wears rx glasses - up to date with routine eye exams with [facility]  Dietary issues and exercise activities discussed: Current Exercise Habits: Home exercise  routine, Type of exercise: walking, Time (Minutes): 50, Frequency (Times/Week): 5, Weekly Exercise (Minutes/Week): 250, Intensity:  Mild, Exercise limited by: cardiac condition(s)   Goals Addressed             This Visit's Progress    DIET - INCREASE WATER INTAKE   On track    Try to drink 6-8 glasses of water daily     Exercise 3x per week (30 min per time)   On track    Would like to get down to 203# Wants to travel more       Depression Screen    09/21/2021    3:34 PM 07/24/2021    1:25 PM 03/27/2021    1:17 PM 03/27/2021    1:16 PM 12/26/2020    2:43 PM 09/20/2020    2:21 PM 08/30/2020    3:02 PM  PHQ 2/9 Scores  PHQ - 2 Score '1 1 1 '$ 0 1 0 0  PHQ- 9 Score '3 3 2  3      '$ Fall Risk    09/21/2021    3:31 PM 07/24/2021    1:23 PM 03/27/2021    1:16 PM 12/26/2020    2:43 PM 09/20/2020    2:15 PM  Fall Risk   Falls in the past year? 0 0 0 0 0  Number falls in past yr: 0      Injury with Fall? 0      Risk for fall due to : No Fall Risks      Follow up Falls prevention discussed        [place]:  Any stairs in or around the home? Yes  If so, are there any without handrails? No  Home free of loose throw rugs in walkways, pet beds, electrical cords, etc? Yes  Adequate lighting in your home to reduce risk of falls? Yes   ASSISTIVE DEVICES UTILIZED TO PREVENT FALLS:  Life alert? No  Use of a cane, walker or w/c? No  Grab bars in the bathroom? No  Shower chair or bench in shower? Yes  Elevated toilet seat or a handicapped toilet? Yes   TIMED UP AND GO:  Was the test performed? No . Telephonic visit  Cognitive Function:    09/02/2017   11:04 AM 07/25/2016   10:54 AM  MMSE - Mini Mental State Exam  Orientation to time 5 5  Orientation to Place 5 5  Registration 3 3  Attention/ Calculation 5 5  Recall 2 3  Language- name 2 objects 2 2  Language- repeat 1 1  Language- follow 3 step command 3 3  Language- read & follow direction 1  1  Write a sentence 1 1  Copy design 1 1  Total score 29 30        09/21/2021    3:37 PM 09/20/2020    2:22 PM 09/20/2019   11:05 AM 09/18/2018    2:22 PM  6CIT Screen  What Year? 0 points 0 points 0 points 0 points  What month? 0 points 0 points 0 points 0 points  What time? 0 points 0 points 0 points 0 points  Count back from 20 0 points 0 points 0 points 0 points  Months in reverse 0 points 0 points 0 points 0 points  Repeat phrase 0 points 0 points 0 points 0 points  Total Score 0 points 0 points 0 points 0 points    Immunizations Immunization History  Administered Date(s) Administered   Fluad Quad(high Dose 65+) 12/02/2018, 12/21/2019, 12/11/2020   Influenza, High Dose  Seasonal PF 12/21/2015, 12/05/2016, 12/23/2017   Influenza,inj,Quad PF,6+ Mos 12/03/2012, 12/30/2013, 01/03/2015   Influenza,inj,quad, With Preservative 12/02/2016   Moderna SARS-COV2 Booster Vaccination 09/12/2020   PFIZER(Purple Top)SARS-COV-2 Vaccination 03/23/2019, 04/15/2019, 01/05/2020   Pneumococcal Conjugate-13 01/12/2014   Pneumococcal Polysaccharide-23 10/01/2012   Tdap 07/03/2010, 01/17/2011   Zoster Recombinat (Shingrix) 07/25/2016, 01/09/2017    TDAP status: Due, Education has been provided regarding the importance of this vaccine. Advised may receive this vaccine at local pharmacy or Health Dept. Aware to provide a copy of the vaccination record if obtained from local pharmacy or Health Dept. Verbalized acceptance and understanding.  Flu Vaccine status: Up to date  Pneumococcal vaccine status: Up to date  Covid-19 vaccine status: Completed vaccines  Qualifies for Shingles Vaccine? Yes   Zostavax completed Yes   Shingrix Completed?: Yes  Screening Tests Health Maintenance  Topic Date Due   COVID-19 Vaccine (4 - Booster for Pfizer series) 11/07/2020   TETANUS/TDAP  03/27/2022 (Originally 01/16/2021)   Hepatitis C Screening  07/25/2022 (Originally 11/04/1958)   INFLUENZA VACCINE   10/02/2021   Pneumonia Vaccine 56+ Years old  Completed   Zoster Vaccines- Shingrix  Completed   HPV VACCINES  Aged Out   COLONOSCOPY (Pts 45-66yr Insurance coverage will need to be confirmed)  Discontinued    Health Maintenance  Health Maintenance Due  Topic Date Due   COVID-19 Vaccine (4 - Booster for PIndustryseries) 11/07/2020    Colorectal cancer screening: No longer required.   Lung Cancer Screening: (Low Dose CT Chest recommended if Age 79-80years, 30 pack-year currently smoking OR have quit w/in 15years.) does not qualify.   Additional Screening:  Hepatitis C Screening: does not qualify  Vision Screening: Recommended annual ophthalmology exams for early detection of glaucoma and other disorders of the eye. Is the patient up to date with their annual eye exam?  Yes  Who is the provider or what is the name of the office in which the patient attends annual eye exams? [facility]aneIf pt is not established with a provider, would they like to be referred to a provider to establish care? No .   Dental Screening: Recommended annual dental exams for proper oral hygiene  Community Resource Referral / Chronic Care Management: CRR required this visit?  No   CCM required this visit?  No      Plan:     I have personally reviewed and noted the following in the patient's chart:   Medical and social history Use of alcohol, tobacco or illicit drugs  Current medications and supplements including opioid prescriptions. Patient is not currently taking opioid prescriptions. Functional ability and status Nutritional status Physical activity Advanced directives List of other physicians Hospitalizations, surgeries, and ER visits in previous 12 months Vitals Screenings to include cognitive, depression, and falls Referrals and appointments  In addition, I have reviewed and discussed with patient certain preventive protocols, quality metrics, and best practice recommendations. A written  personalized care plan for preventive services as well as general preventive health recommendations were provided to patient.     ASandrea Hammond LPN   [id number]  Nurse Notes: None

## 2021-09-21 NOTE — Patient Instructions (Signed)
[name] [name] , Thank you for taking time to come for your Medicare Wellness Visit. I appreciate your ongoing commitment to your health goals. Please review the following plan we discussed and let me know if I can assist you in the future.   Screening recommendations/referrals: Colonoscopy: Done 07/12/2020 - no repeat required Recommended yearly ophthalmology/optometry visit for glaucoma screening and checkup Recommended yearly dental visit for hygiene and checkup  Vaccinations: Influenza vaccine: Done 12/11/2020 - Repeat annually  Pneumococcal vaccine: Done 10/01/2012 & 01/12/2014  Tdap vaccine: Done 01/17/2011 - Repeat in 10 years *due Shingles vaccine: Done 07/25/2016 & 01/09/2017   Covid-19: Done  03/23/2019, 04/15/2019, 01/05/2020, & 09/12/2020  Advanced directives: in chart  Conditions/risks identified: Aim for 30 minutes of exercise or brisk walking, 6-8 glasses of water, and 5 servings of fruits and vegetables each day.   Next appointment: Follow up in one year for your annual wellness visit.   Preventive Care 49 Years and Older, Male  Preventive care refers to lifestyle choices and visits with your health care provider that can promote health and wellness. What does preventive care include? A yearly physical exam. This is also called an annual well check. Dental exams once or twice a year. Routine eye exams. Ask your health care provider how often you should have your eyes checked. Personal lifestyle choices, including: Daily care of your teeth and gums. Regular physical activity. Eating a healthy diet. Avoiding tobacco and drug use. Limiting alcohol use. Practicing safe sex. Taking low doses of aspirin every day. Taking vitamin and mineral supplements as recommended by your health care provider. What happens during an annual well check? The services and screenings done by your health care provider during your annual well check will depend on your age, overall health, lifestyle  risk factors, and family history of disease. Counseling  Your health care provider may ask you questions about your: Alcohol use. Tobacco use. Drug use. Emotional well-being. Home and relationship well-being. Sexual activity. Eating habits. History of falls. Memory and ability to understand (cognition). Work and work Statistician. Screening  You may have the following tests or measurements: Height, weight, and BMI. Blood pressure. Lipid and cholesterol levels. These may be checked every 5 years, or more frequently if you are over 39 years old. Skin check. Lung cancer screening. You may have this screening every year starting at age 37 if you have a 30-pack-year history of smoking and currently smoke or have quit within the past 15 years. Fecal occult blood test (FOBT) of the stool. You may have this test every year starting at age 24. Flexible sigmoidoscopy or colonoscopy. You may have a sigmoidoscopy every 5 years or a colonoscopy every 10 years starting at age 55. Prostate cancer screening. Recommendations will vary depending on your family history and other risks. Hepatitis C blood test. Hepatitis B blood test. Sexually transmitted disease (STD) testing. Diabetes screening. This is done by checking your blood sugar (glucose) after you have not eaten for a while (fasting). You may have this done every 1-3 years. Abdominal aortic aneurysm (AAA) screening. You may need this if you are a current or former smoker. Osteoporosis. You may be screened starting at age 70 if you are at high risk. Talk with your health care provider about your test results, treatment options, and if necessary, the need for more tests. Vaccines  Your health care provider may recommend certain vaccines, such as: Influenza vaccine. This is recommended every year. Tetanus, diphtheria, and acellular pertussis (Tdap, Td)  vaccine. You may need a Td booster every 10 years. Zoster vaccine. You may need this after age  50. Pneumococcal 13-valent conjugate (PCV13) vaccine. One dose is recommended after age 73. Pneumococcal polysaccharide (PPSV23) vaccine. One dose is recommended after age 58. Talk to your health care provider about which screenings and vaccines you need and how often you need them. This information is not intended to replace advice given to you by your health care provider. Make sure you discuss any questions you have with your health care provider. Document Released: 03/17/2015 Document Revised: 11/08/2015 Document Reviewed: 12/20/2014 Elsevier Interactive Patient Education  2017 [place] Prevention in the Home Falls can cause injuries. They can happen to people of all ages. There are many things you can do to make your home safe and to help prevent falls. What can I do on the outside of my home? Regularly fix the edges of walkways and driveways and fix any cracks. Remove anything that might make you trip as you walk through a door, such as a raised step or threshold. Trim any bushes or trees on the path to your home. Use bright outdoor lighting. Clear any walking paths of anything that might make someone trip, such as rocks or tools. Regularly check to see if handrails are loose or broken. Make sure that both sides of any steps have handrails. Any raised decks and porches should have guardrails on the edges. Have any leaves, snow, or ice cleared regularly. Use sand or salt on walking paths during winter. Clean up any spills in your garage right away. This includes oil or grease spills. What can I do in the bathroom? Use night lights. Install grab bars by the toilet and in the tub and shower. Do not use towel bars as grab bars. Use non-skid mats or decals in the tub or shower. If you need to sit down in the shower, use a plastic, non-slip stool. Keep the floor dry. Clean up any water that spills on the floor as soon as it happens. Remove soap buildup in the tub or shower  regularly. Attach bath mats securely with double-sided non-slip rug tape. Do not have throw rugs and other things on the floor that can make you trip. What can I do in the bedroom? Use night lights. Make sure that you have a light by your bed that is easy to reach. Do not use any sheets or blankets that are too big for your bed. They should not hang down onto the floor. Have a firm chair that has side arms. You can use this for support while you get dressed. Do not have throw rugs and other things on the floor that can make you trip. What can I do in the kitchen? Clean up any spills right away. Avoid walking on wet floors. Keep items that you use a lot in easy-to-reach places. If you need to reach something above you, use a strong step stool that has a grab bar. Keep electrical cords out of the way. Do not use floor polish or wax that makes floors slippery. If you must use wax, use non-skid floor wax. Do not have throw rugs and other things on the floor that can make you trip. What can I do with my stairs? Do not leave any items on the stairs. Make sure that there are handrails on both sides of the stairs and use them. Fix handrails that are broken or loose. Make sure that handrails are as long  as the stairways. Check any carpeting to make sure that it is firmly attached to the stairs. Fix any carpet that is loose or worn. Avoid having throw rugs at the top or bottom of the stairs. If you do have throw rugs, attach them to the floor with carpet tape. Make sure that you have a light switch at the top of the stairs and the bottom of the stairs. If you do not have them, ask someone to add them for you. What else can I do to help prevent falls? Wear shoes that: Do not have high heels. Have rubber bottoms. Are comfortable and fit you well. Are closed at the toe. Do not wear sandals. If you use a stepladder: Make sure that it is fully opened. Do not climb a closed stepladder. Make sure that  both sides of the stepladder are locked into place. Ask someone to hold it for you, if possible. Clearly mark and make sure that you can see: Any grab bars or handrails. First and last steps. Where the edge of each step is. Use tools that help you move around (mobility aids) if they are needed. These include: Canes. Walkers. Scooters. Crutches. Turn on the lights when you go into a dark area. Replace any light bulbs as soon as they burn out. Set up your furniture so you have a clear path. Avoid moving your furniture around. If any of your floors are uneven, fix them. If there are any pets around you, be aware of where they are. Review your medicines with your doctor. Some medicines can make you feel dizzy. This can increase your chance of falling. Ask your doctor what other things that you can do to help prevent falls. This information is not intended to replace advice given to you by your health care provider. Make sure you discuss any questions you have with your health care provider. Document Released: 12/15/2008 Document Revised: 07/27/2015 Document Reviewed: 03/25/2014 Elsevier Interactive Patient Education  2017 [employer].

## 2021-09-23 DIAGNOSIS — G4733 Obstructive sleep apnea (adult) (pediatric): Secondary | ICD-10-CM | POA: Diagnosis not present

## 2021-09-24 ENCOUNTER — Encounter: Payer: Self-pay | Admitting: Dermatology

## 2021-09-24 NOTE — Progress Notes (Signed)
   Follow-Up Visit   Subjective  [name] [name] is a 81 y.o. male who presents for the following: Annual Exam (Here for annual skin exam. Concerns top of nose. Not much feel but a little scaly. ).  Discussed general skin check, several spots of concern Location:  Duration:  Quality:  Associated Signs/Symptoms: Modifying Factors:  Severity:  Timing: Context:   Objective  Well appearing patient in no apparent distress; mood and affect are within normal limits. General skin examination.  Milia and open cyst left inner eye, seb k and angioma on the chest. No atypical nevi or signs of NMSC noted at the time of the visit.   Chest (Upper Torso, Anterior) (2), Right Nasal Sidewall Gritty 4 mm crusts    A full examination was performed including scalp, head, eyes, ears, nose, lips, neck, chest, axillae, abdomen, back, buttocks, bilateral upper extremities, bilateral lower extremities, hands, feet, fingers, toes, fingernails, and toenails. All findings within normal limits unless otherwise noted below.  Areas beneath undergarments not fully examined   Assessment & Plan    Screening exam for skin cancer  AK (actinic keratosis) (3) Chest (Upper Torso, Anterior) (2); Right Nasal Sidewall  Destruction of lesion - Chest (Upper Torso, Anterior), Right Nasal Sidewall Complexity: simple   Destruction method: cryotherapy   Informed consent: discussed and consent obtained   Timeout:  patient name, date of birth, surgical site, and procedure verified Lesion destroyed using liquid nitrogen: Yes   Cryotherapy cycles:  3 Outcome: patient tolerated procedure well with no complications        I, Lavonna Monarch, MD, have reviewed all documentation for this visit.  The documentation on 09/24/21 for the exam, diagnosis, procedures, and orders are all accurate and complete.

## 2021-10-01 ENCOUNTER — Telehealth: Payer: Self-pay | Admitting: Pharmacist Clinician (PhC)/ Clinical Pharmacy Specialist

## 2021-10-01 NOTE — Telephone Encounter (Signed)
Repatha PA approved to 12.31.23  [place]

## 2021-10-24 DIAGNOSIS — G4733 Obstructive sleep apnea (adult) (pediatric): Secondary | ICD-10-CM | POA: Diagnosis not present

## 2021-11-24 DIAGNOSIS — G4733 Obstructive sleep apnea (adult) (pediatric): Secondary | ICD-10-CM | POA: Diagnosis not present

## 2021-12-03 ENCOUNTER — Ambulatory Visit (INDEPENDENT_AMBULATORY_CARE_PROVIDER_SITE_OTHER): Payer: Medicare Other | Admitting: Family Medicine

## 2021-12-03 ENCOUNTER — Encounter: Payer: Self-pay | Admitting: Family Medicine

## 2021-12-03 VITALS — BP 142/77 | HR 60 | Temp 98.0°F | Ht 73.0 in | Wt 216.6 lb

## 2021-12-03 DIAGNOSIS — Z23 Encounter for immunization: Secondary | ICD-10-CM | POA: Diagnosis not present

## 2021-12-03 DIAGNOSIS — R4189 Other symptoms and signs involving cognitive functions and awareness: Secondary | ICD-10-CM

## 2021-12-03 DIAGNOSIS — R202 Paresthesia of skin: Secondary | ICD-10-CM

## 2021-12-03 DIAGNOSIS — R300 Dysuria: Secondary | ICD-10-CM | POA: Diagnosis not present

## 2021-12-03 DIAGNOSIS — R739 Hyperglycemia, unspecified: Secondary | ICD-10-CM | POA: Diagnosis not present

## 2021-12-03 DIAGNOSIS — R200 Anesthesia of skin: Secondary | ICD-10-CM

## 2021-12-03 DIAGNOSIS — K117 Disturbances of salivary secretion: Secondary | ICD-10-CM

## 2021-12-03 DIAGNOSIS — R2689 Other abnormalities of gait and mobility: Secondary | ICD-10-CM | POA: Diagnosis not present

## 2021-12-03 DIAGNOSIS — R140 Abdominal distension (gaseous): Secondary | ICD-10-CM

## 2021-12-03 LAB — URINALYSIS, ROUTINE W REFLEX MICROSCOPIC
Bilirubin, UA: NEGATIVE
Glucose, UA: NEGATIVE
Ketones, UA: NEGATIVE
Leukocytes,UA: NEGATIVE
Nitrite, UA: NEGATIVE
Protein,UA: NEGATIVE
RBC, UA: NEGATIVE
Specific Gravity, UA: 1.01 (ref 1.005–1.030)
Urobilinogen, Ur: 0.2 mg/dL (ref 0.2–1.0)
pH, UA: 6.5 (ref 5.0–7.5)

## 2021-12-03 LAB — BAYER DCA HB A1C WAIVED: HB A1C (BAYER DCA - WAIVED): 5.8 % — ABNORMAL HIGH (ref 4.8–5.6)

## 2021-12-03 MED ORDER — FLUTICASONE PROPIONATE 50 MCG/ACT NA SUSP: 2.0000 | Freq: Every day | NASAL | 3 refills | Status: AC | PRN

## 2021-12-03 NOTE — Progress Notes (Signed)
Subjective: OE:UMPNTIRW concerns PCP: [name], DO [id number] [name] [name] is a 81 y.o. male presenting to clinic today for:  1.  Dry mouth Patient reports that he is really been having a funny feeling at nighttime in his mouth.  His tongue feels dry and this only occurs at nighttime.  He admits that he uses a CPAP.  This was changed out for a new one 6 months ago but he notes that it is humidified.  He thinks it is on an auto humidification.  He has not reached out to his supplier to see if perhaps they could adjust the settings or if it was even functioning properly but he thinks it is.  He cleans it regularly with appropriate cleaning supplies.  He drinks plenty of water.  He denies any snoring.  He does report feeling foggy headed before meals.  He is never checked his blood sugar to see if they were dropping though.  Pressures have been normal during these times  He reports some difficulty with weakness in the thighs and notes that he has A decrease sensation in the lower leg but the knees .  He is a history of bilateral knee surgeries and has always had some numbness along the lateral aspects of bilateral knees.  Denies any claudication symptoms in the lower extremities and reports exercising regularly  2.  Bloating Patient reports feeling bloated but is not passing gas.  He has not had any specific change in diet.  He denies any change in bowel habits.  No nausea, vomiting reported.  Has a history of lymphocytic colitis and is seen by Dr. Cristopher Peru for that.  Has not reached out to gastroenterology about this issue.   ROS: Per HPI  Allergies  Allergen Reactions   Levofloxacin     Caused C- Diff   Crestor [Rosuvastatin] Other (See Comments)    Liver problems   Vytorin [Ezetimibe-Simvastatin] Other (See Comments)    Liver problems   Past Medical History:  Diagnosis Date   AAA (abdominal aortic aneurysm) ([place])    gets abd [place] every 3 years to watch,last check 08/2018    Arthritis    BPH (benign prostatic hyperplasia)    CAD (coronary artery disease)    Colon polyps    Dyslipidemia    GERD (gastroesophageal reflux disease)    Heart murmur    Herniated disc    Hiatal hernia    History of kidney stones    HTN (hypertension)    Hyperlipidemia    OSA on CPAP    Tinnitus     Current Outpatient Medications:    aspirin EC 81 MG tablet, Take 81 mg by mouth daily. Swallow whole., Disp: , Rfl:    busPIRone (BUSPAR) 15 MG tablet, Take 1 tablet (15 mg total) by mouth 2 (two) times daily., Disp: 180 tablet, Rfl: 3   Cholecalciferol ([employer] VIT D 5000 HIGH-POTENCY PO), Take 1 tablet by mouth daily., Disp: , Rfl:    Digestive Enzymes (ENZYME DIGEST PO), Take 1 capsule by mouth at bedtime., Disp: , Rfl:    Evolocumab (REPATHA SURECLICK) 761 MG/ML SOAJ, INJECT 1 ML ('140MG'$ ) EVERY 2 WEEKS AS DIRECTED, Disp: 6 mL, Rfl: 3   fluticasone (FLONASE) 50 MCG/ACT nasal spray, Place 2 sprays into both nostrils daily as needed for allergies or rhinitis., Disp: , Rfl:    mesalamine (LIALDA) 1.2 g EC tablet, Take 4.8 g by mouth every other day., Disp: , Rfl:    metoprolol tartrate (  LOPRESSOR) 25 MG tablet, TAKE 1 TABLET TWICE A DAY, Disp: 180 tablet, Rfl: 3   omeprazole (PRILOSEC) 20 MG capsule, Take 20 mg by mouth daily., Disp: , Rfl:    Probiotic Product (PROBIOTIC PO), Take 1 capsule by mouth daily., Disp: , Rfl:    triamcinolone ointment (KENALOG) 0.5 %, Apply 1 application. topically 2 (two) times daily., Disp: 30 g, Rfl: 0   Turmeric (QC TUMERIC COMPLEX PO), Take by mouth daily., Disp: , Rfl:    nitroGLYCERIN (NITROSTAT) 0.4 MG SL tablet, Place 1 tablet (0.4 mg total) under the tongue every 5 (five) minutes as needed for chest pain., Disp: 25 tablet, Rfl: prn Social History   Socioeconomic History   Marital status: Married    Spouse name: Constance Holster   Number of children: 2   Years of education: Not on file   Highest education level: Bachelor's degree (e.g., BA, AB, BS)   Occupational History   Occupation: Armed forces technical officer for [employer]  Tobacco Use   Smoking status: Never   Smokeless tobacco: Never   Tobacco comments:    does not smoke   Vaping Use   Vaping Use: Never used  Substance and Sexual Activity   Alcohol use: Yes    Alcohol/week: 1.0 standard drink of alcohol    Types: 1 Glasses of wine per week    Comment: with meal   Drug use: No   Sexual activity: Yes  Other Topics Concern   Not on file  Social History Narrative   Married, 2 adult children and 4 grandchildren. Retired    Scientist, physiological Strain: [place]  (09/21/2021)   Overall Financial Resource Strain (CARDIA)    Difficulty of Paying Living Expenses: Not hard at all  Food Insecurity: No Food Insecurity (09/21/2021)   Hunger Vital Sign    Worried About Running Out of Food in the Last Year: Never true    [place] in the Last Year: Never true  Transportation Needs: No Transportation Needs (09/21/2021)   PRAPARE - Hydrologist (Medical): No    Lack of Transportation (Non-Medical): No  Physical Activity: Sufficiently Active (09/21/2021)   Exercise Vital Sign    Days of Exercise per Week: 5 days    Minutes of Exercise per Session: 50 min  Stress: No Stress Concern Present (09/21/2021)   [place]    Feeling of Stress : Not at all  Social Connections: [place] (09/21/2021)   Social Connection and Isolation Panel [NHANES]    Frequency of Communication with Friends and Family: More than three times a week    Frequency of Social Gatherings with Friends and Family: More than three times a week    Attends Religious Services: More than 4 times per year    Active Member of [employer] or Organizations: Yes    Attends Music therapist: More than 4 times per year    Marital Status: Married  Human resources officer Violence: Not At  Risk (09/21/2021)   Humiliation, Afraid, Rape, and Kick questionnaire    Fear of Current or Ex-Partner: No    Emotionally Abused: No    Physically Abused: No    Sexually Abused: No   Family History  Problem Relation Age of Onset   Heart disease Father    Cancer Father        Bladder Cancer   Heart attack Father  Colon polyps Father    Hypertension Mother    Alzheimer's disease Mother    Stroke Mother        3-4    Diabetes Brother    Heart disease Brother    Arthritis Brother        bilateral knee replacement, back issues    Pneumonia Sister    GI problems Sister        esophogus stretched    Crohn's disease Son     Objective: Office vital signs reviewed. BP (!) 142/77   Pulse 60   Temp 98 F (36.7 C)   Ht '6\' 1"'$  (1.854 m)   Wt 216 lb 9.6 oz (98.2 kg)   SpO2 99%   BMI 28.58 kg/m   Physical Examination:  General: Awake, alert, nontoxic male, No acute distress HEENT: Oropharynx unremarkable.  Moist mucous membranes present on exam. Cardio: regular rate and rhythm, S1S2 heard, no murmurs appreciated Pulm: clear to auscultation bilaterally, no wheezes, rhonchi or rales; normal work of breathing on room air GI: Nondistended. Extremities: warm, well perfused, No edema, cyanosis or clubbing; +2 pulses bilaterally.  Does have some varicose veins in the lower extremities MSK: Independently without difficulty.  Postsurgical scars noted along bilateral knees Neuro: Light touch sensation grossly intact bilaterally.  This is symmetric.  Assessment/ Plan: 81 y.o. male   Xerostomia - Plan: Bayer DCA Hb A1c Waived, ANA w/Reflex if Positive  Need for immunization against influenza - Plan: Flu Vaccine QUAD High Dose(Fluad)  Numbness and tingling - Plan: Vitamin B12, Bayer DCA Hb A1c Waived  Balance problems - Plan: Vitamin B12  Abdominal bloating  Dysuria - Plan: Urinalysis, Routine w reflex microscopic  Brain fog  Discussed use of Biotene.  I certainly recommend that  he reach out to his CPAP supplier to see if perhaps he needs some adjustment of his modification in the CPAP since this seems to be isolated only to sleeping at nighttime.  We will check for any autoimmune component, check sugar  Influenza vaccination administered  Uncertain etiology of the reports of numbness and tingling in the lower extremities.  Check vitamin B12 level, sugar level.  May need to consider referral to neurology for further assessment and nerve conduction studies  With regards to abdominal bloating, uncertain if this is related to his lymphocytic colitis but a low FODMAP diet will be recommended and handout will be provided.  He reported urine allergy symptoms after he was discharged from my care so my nurse obtained a urine.  Further recommendations pending these results  With regards to the brain fog he reported, I suspect that this may be levels in his sugar prior to meals.  We will see if we can get him a point-of-care sugar meter and showed him how to use this.  May need to have healthy snacks in between meals to maintain blood sugar  No orders of the defined types were placed in this encounter.  No orders of the defined types were placed in this encounter.    [name], DO [place] [phone]

## 2021-12-03 NOTE — Patient Instructions (Addendum)
Biotene for dry mouth Call for CPAP evaluation I will refer to neurology for further evaluation of the legs if labs are all normal.  Low-FODMAP Eating Plan  FODMAP stands for fermentable oligosaccharides, disaccharides, monosaccharides, and polyols. These are sugars that are hard for some people to digest. A low-FODMAP eating plan may help some people who have irritable bowel syndrome (IBS) and certain other bowel (intestinal) diseases to manage their symptoms. This meal plan can be complicated to follow. Work with a diet and nutrition specialist (dietitian) to make a low-FODMAP eating plan that is right for you. A dietitian can help make sure that you get enough nutrition from this diet. What are tips for following this plan? Reading food labels Check labels for hidden FODMAPs such as: High-fructose syrup. Honey. Agave. Natural fruit flavors. Onion or garlic powder. Choose low-FODMAP foods that contain 3-4 grams of fiber per serving. Check food labels for serving sizes. Eat only one serving at a time to make sure FODMAP levels stay low. Shopping Shop with a list of foods that are recommended on this diet and make a meal plan. Meal planning Follow a low-FODMAP eating plan for up to 6 weeks, or as told by your health care provider or dietitian. To follow the eating plan: Eliminate high-FODMAP foods from your diet completely. Choose only low-FODMAP foods to eat. You will do this for 2-6 weeks. Gradually reintroduce high-FODMAP foods into your diet one at a time. Most people should wait a few days before introducing the next new high-FODMAP food into their meal plan. Your dietitian can recommend how quickly you may reintroduce foods. Keep a daily record of what and how much you eat and drink. Make note of any symptoms that you have after eating. Review your daily record with a dietitian regularly to identify which foods you can eat and which foods you should avoid. General tips Drink enough  fluid each day to keep your urine pale yellow. Avoid processed foods. These often have added sugar and may be high in FODMAPs. Avoid most dairy products, whole grains, and sweeteners. Work with a dietitian to make sure you get enough fiber in your diet. Avoid high FODMAP foods at meals to manage symptoms. Recommended foods Fruits Bananas, oranges, tangerines, lemons, limes, blueberries, raspberries, strawberries, grapes, cantaloupe, honeydew melon, kiwi, papaya, passion fruit, and pineapple. Limited amounts of dried cranberries, banana chips, and shredded coconut. Vegetables Eggplant, zucchini, cucumber, peppers, green beans, bean sprouts, lettuce, arugula, kale, Swiss chard, spinach, collard greens, bok choy, summer squash, potato, and tomato. Limited amounts of corn, carrot, and sweet potato. Green parts of scallions. Grains Gluten-free grains, such as rice, oats, buckwheat, quinoa, corn, polenta, and millet. Gluten-free pasta, bread, or cereal. Rice noodles. Corn tortillas. Meats and other proteins Unseasoned beef, pork, poultry, or fish. Eggs. Berniece Salines. Tofu (firm) and tempeh. Limited amounts of nuts and seeds, such as almonds, walnuts, [place] nuts, pecans, peanuts, nut butters, pumpkin seeds, chia seeds, and sunflower seeds. Dairy Lactose-free milk, yogurt, and kefir. Lactose-free cottage cheese and ice cream. Non-dairy milks, such as almond, coconut, hemp, and rice milk. Non-dairy yogurt. Limited amounts of goat cheese, brie, mozzarella, parmesan, swiss, and other hard cheeses. Fats and oils Butter-free spreads. Vegetable oils, such as olive, canola, and sunflower oil. Seasoning and other foods Artificial sweeteners with names that do not end in "ol," such as aspartame, saccharine, and stevia. Maple syrup, white table sugar, raw sugar, brown sugar, and molasses. Mayonnaise, soy sauce, and tamari. Fresh basil, coriander, parsley, rosemary,  and thyme. Beverages Water and mineral water.  Sugar-sweetened soft drinks. Small amounts of orange juice or cranberry juice. Black and green tea. Most dry wines. Coffee. The items listed above may not be a complete list of foods and beverages you can eat. Contact a dietitian for more information. Foods to avoid Fruits Fresh, dried, and juiced forms of apple, pear, watermelon, peach, plum, cherries, apricots, blackberries, boysenberries, figs, nectarines, and mango. Avocado. Vegetables Chicory root, artichoke, asparagus, cabbage, snow peas, Brussels sprouts, broccoli, sugar snap peas, mushrooms, celery, and cauliflower. Onions, garlic, leeks, and the white part of scallions. Grains Wheat, including kamut, durum, and semolina. Barley and bulgur. Couscous. Wheat-based cereals. Wheat noodles, bread, crackers, and pastries. Meats and other proteins Fried or fatty meat. Sausage. Cashews and pistachios. Soybeans, baked beans, black beans, chickpeas, kidney beans, fava beans, navy beans, lentils, black-eyed peas, and split peas. Dairy Milk, yogurt, ice cream, and soft cheese. Cream and sour cream. Milk-based sauces. Custard. Buttermilk. Soy milk. Seasoning and other foods Any sugar-free gum or candy. Foods that contain artificial sweeteners such as sorbitol, mannitol, isomalt, or xylitol. Foods that contain honey, high-fructose corn syrup, or agave. Bouillon, vegetable stock, beef stock, and chicken stock. Garlic and onion powder. Condiments made with onion, such as hummus, chutney, pickles, relish, salad dressing, and salsa. Tomato paste. Beverages Chicory-based drinks. Coffee substitutes. Chamomile tea. Fennel tea. Sweet or fortified wines such as port or sherry. Diet soft drinks made with isomalt, mannitol, maltitol, sorbitol, or xylitol. Apple, pear, and mango juice. Juices with high-fructose corn syrup. The items listed above may not be a complete list of foods and beverages you should avoid. Contact a dietitian for more  information. Summary FODMAP stands for fermentable oligosaccharides, disaccharides, monosaccharides, and polyols. These are sugars that are hard for some people to digest. A low-FODMAP eating plan is a short-term diet that helps to ease symptoms of certain bowel diseases. The eating plan usually lasts up to 6 weeks. After that, high-FODMAP foods are reintroduced gradually and one at a time. This can help you find out which foods may be causing symptoms. A low-FODMAP eating plan can be complicated. It is best to work with a dietitian who has experience with this type of plan. This information is not intended to replace advice given to you by your health care provider. Make sure you discuss any questions you have with your health care provider. Document Revised: 07/08/2019 Document Reviewed: 07/08/2019 Elsevier Patient Education  [place].

## 2021-12-04 ENCOUNTER — Ambulatory Visit: Payer: Medicare Other

## 2021-12-04 ENCOUNTER — Encounter: Payer: Self-pay | Admitting: Family Medicine

## 2021-12-04 DIAGNOSIS — E785 Hyperlipidemia, unspecified: Secondary | ICD-10-CM

## 2021-12-04 LAB — ANA W/REFLEX IF POSITIVE
Anti JO-1: <0.2 AI (ref 0.0–0.9)
Anti Nuclear Antibody (ANA): POSITIVE — AB
Centromere Ab Screen: <0.2 AI (ref 0.0–0.9)
Chromatin Ab SerPl-aCnc: <0.2 AI (ref 0.0–0.9)
ENA RNP Ab: 7.9 AI — ABNORMAL HIGH (ref 0.0–0.9)
ENA SM Ab Ser-aCnc: <0.2 AI (ref 0.0–0.9)
ENA SSA (RO) Ab: 0.2 AI (ref 0.0–0.9)
ENA SSB (LA) Ab: <0.2 AI (ref 0.0–0.9)
Scleroderma (Scl-70) (ENA) Antibody, IgG: <0.2 AI (ref 0.0–0.9)
dsDNA Ab: <1 IU/mL (ref 0–9)

## 2021-12-04 LAB — VITAMIN B12: Vitamin B-12: 988 pg/mL (ref 232–1245)

## 2021-12-04 NOTE — Progress Notes (Signed)
Showed patient how to use [employer]u- Check.

## 2021-12-17 DIAGNOSIS — G4733 Obstructive sleep apnea (adult) (pediatric): Secondary | ICD-10-CM | POA: Diagnosis not present

## 2021-12-19 ENCOUNTER — Encounter: Payer: Self-pay | Admitting: Family Medicine

## 2021-12-20 DIAGNOSIS — G4733 Obstructive sleep apnea (adult) (pediatric): Secondary | ICD-10-CM | POA: Diagnosis not present

## 2021-12-24 DIAGNOSIS — G4733 Obstructive sleep apnea (adult) (pediatric): Secondary | ICD-10-CM | POA: Diagnosis not present

## 2022-01-02 ENCOUNTER — Ambulatory Visit: Payer: Medicare Other

## 2022-01-08 ENCOUNTER — Telehealth: Payer: Self-pay | Admitting: Family Medicine

## 2022-01-08 ENCOUNTER — Ambulatory Visit (INDEPENDENT_AMBULATORY_CARE_PROVIDER_SITE_OTHER): Payer: Medicare Other | Admitting: Family Medicine

## 2022-01-08 ENCOUNTER — Ambulatory Visit: Payer: Medicare Other | Admitting: Family Medicine

## 2022-01-08 ENCOUNTER — Other Ambulatory Visit: Payer: Self-pay | Admitting: Family Medicine

## 2022-01-08 ENCOUNTER — Encounter: Payer: Self-pay | Admitting: Family Medicine

## 2022-01-08 VITALS — BP 150/77 | HR 60 | Temp 98.2°F | Ht 73.0 in | Wt 221.4 lb

## 2022-01-08 DIAGNOSIS — E785 Hyperlipidemia, unspecified: Secondary | ICD-10-CM

## 2022-01-08 DIAGNOSIS — D696 Thrombocytopenia, unspecified: Secondary | ICD-10-CM

## 2022-01-08 DIAGNOSIS — J40 Bronchitis, not specified as acute or chronic: Secondary | ICD-10-CM | POA: Diagnosis not present

## 2022-01-08 DIAGNOSIS — R509 Fever, unspecified: Secondary | ICD-10-CM | POA: Diagnosis not present

## 2022-01-08 DIAGNOSIS — J329 Chronic sinusitis, unspecified: Secondary | ICD-10-CM | POA: Diagnosis not present

## 2022-01-08 DIAGNOSIS — J029 Acute pharyngitis, unspecified: Secondary | ICD-10-CM | POA: Diagnosis not present

## 2022-01-08 DIAGNOSIS — N400 Enlarged prostate without lower urinary tract symptoms: Secondary | ICD-10-CM

## 2022-01-08 MED ORDER — AMOXICILLIN-POT CLAVULANATE 875-125 MG PO TABS: 1.0000 | ORAL_TABLET | Freq: Two times a day (BID) | ORAL | 0 refills | Status: DC

## 2022-01-08 NOTE — Progress Notes (Signed)
Orders Placed This Encounter  Procedures   CMP14+EGFR    Standing Status:   Future    Standing Expiration Date:   01/09/2023   Lipid panel    Standing Status:   Future    Standing Expiration Date:   01/09/2023   PSA    Standing Status:   Future    Standing Expiration Date:   01/09/2023   CBC    Standing Status:   Future    Standing Expiration Date:   01/09/2023

## 2022-01-08 NOTE — Progress Notes (Signed)
Chief Complaint  Patient presents with   Sore Throat   Generalized Body Aches   Low Grade Fever    HPI  Patient presents today for Patient presents with upper respiratory congestion. Rhinorrhea that is mostly clear. There is moderate sore throat. Patient reports coughing frequently as well.  No sputum noted. There is LG 99 fever, no chills or sweats. The patient denies being short of breath. Onset was yesterday evening. Gradually worsening. Tried OTCs without improvement. Right ear pain with ringing.   Left 5th finger pain from injury that pulled the nail up off of the naibed . It has reattached, but is still sore and swollen.   PMH: Smoking status noted ROS: Per HPI  Objective: BP (!) 150/77   Pulse 60   Temp 98.2 F (36.8 C)   Ht '6\' 1"'$  (1.854 m)   Wt 221 lb 6.4 oz (100.4 kg)   SpO2 97%   BMI 29.21 kg/m  Gen: NAD, alert, cooperative with exam HEENT: NCAT, Nasal passages swollen, red TMS RED CV: RRR, good S1/S2, no murmur Resp: Bronchitis changes with scattered wheezes, non-labored Ext: No edema, warm. Minimal dema and tenderness at left hand 5th DIP Neuro: Alert and oriented, No gross deficits  Assessment and plan:  1. Sinobronchitis     Meds ordered this encounter  Medications   amoxicillin-clavulanate (AUGMENTIN) 875-125 MG tablet    Sig: Take 1 tablet by mouth 2 (two) times daily. Take all of this medication    Dispense:  20 tablet    Refill:  0    Orders Placed This Encounter  Procedures   COVID-19, Flu A+B and RSV    Order Specific Question:   Previously tested for COVID-19    Answer:   Unknown    Order Specific Question:   Resident in a congregate (group) care setting    Answer:   Unknown    Order Specific Question:   Is the patient student?    Answer:   No    Order Specific Question:   Employed in healthcare setting    Answer:   No    Order Specific Question:   Has patient completed COVID vaccination(s) (2 doses of Pfizer/Moderna 1 dose of [employer])    Answer:   Unknown    Follow up as needed.  Claretta Fraise, MD

## 2022-01-08 NOTE — Telephone Encounter (Signed)
done

## 2022-01-09 ENCOUNTER — Other Ambulatory Visit: Payer: Self-pay | Admitting: Family Medicine

## 2022-01-09 ENCOUNTER — Telehealth: Payer: Self-pay | Admitting: Family Medicine

## 2022-01-09 ENCOUNTER — Encounter: Payer: Self-pay | Admitting: Family Medicine

## 2022-01-09 LAB — COVID-19, FLU A+B AND RSV
Influenza A, NAA: NOT DETECTED
Influenza B, NAA: NOT DETECTED
RSV, NAA: NOT DETECTED
SARS-CoV-2, NAA: DETECTED — AB

## 2022-01-09 MED ORDER — NIRMATRELVIR/RITONAVIR (PAXLOVID)TABLET: 3.0000 | ORAL_TABLET | Freq: Two times a day (BID) | ORAL | 0 refills | Status: AC

## 2022-01-09 NOTE — Telephone Encounter (Signed)
Pt aware.

## 2022-01-09 NOTE — Telephone Encounter (Signed)
I sent in paxlovid for him

## 2022-01-14 ENCOUNTER — Other Ambulatory Visit: Payer: Medicare Other

## 2022-01-14 DIAGNOSIS — E785 Hyperlipidemia, unspecified: Secondary | ICD-10-CM | POA: Diagnosis not present

## 2022-01-14 DIAGNOSIS — D696 Thrombocytopenia, unspecified: Secondary | ICD-10-CM | POA: Diagnosis not present

## 2022-01-14 DIAGNOSIS — N400 Enlarged prostate without lower urinary tract symptoms: Secondary | ICD-10-CM

## 2022-01-15 ENCOUNTER — Encounter: Payer: Self-pay | Admitting: Family Medicine

## 2022-01-15 ENCOUNTER — Ambulatory Visit (INDEPENDENT_AMBULATORY_CARE_PROVIDER_SITE_OTHER): Payer: Medicare Other | Admitting: Family Medicine

## 2022-01-15 VITALS — BP 139/77 | HR 69 | Temp 98.3°F | Ht 73.0 in | Wt 215.8 lb

## 2022-01-15 DIAGNOSIS — E785 Hyperlipidemia, unspecified: Secondary | ICD-10-CM

## 2022-01-15 DIAGNOSIS — Z8052 Family history of malignant neoplasm of bladder: Secondary | ICD-10-CM

## 2022-01-15 DIAGNOSIS — I10 Essential (primary) hypertension: Secondary | ICD-10-CM | POA: Diagnosis not present

## 2022-01-15 DIAGNOSIS — N400 Enlarged prostate without lower urinary tract symptoms: Secondary | ICD-10-CM | POA: Diagnosis not present

## 2022-01-15 LAB — CMP14+EGFR
ALT: 21 IU/L (ref 0–44)
AST: 26 IU/L (ref 0–40)
Albumin/Globulin Ratio: 1.8 (ref 1.2–2.2)
Albumin: 4.4 g/dL (ref 3.8–4.8)
Alkaline Phosphatase: 55 IU/L (ref 44–121)
BUN/Creatinine Ratio: 18 (ref 10–24)
BUN: 18 mg/dL (ref 8–27)
Bilirubin Total: 0.2 mg/dL (ref 0.0–1.2)
CO2: 23 mmol/L (ref 20–29)
Calcium: 9.4 mg/dL (ref 8.6–10.2)
Chloride: 108 mmol/L — ABNORMAL HIGH (ref 96–106)
Creatinine, Ser: 0.99 mg/dL (ref 0.76–1.27)
Globulin, Total: 2.5 g/dL (ref 1.5–4.5)
Glucose: 100 mg/dL — ABNORMAL HIGH (ref 70–99)
Potassium: 4.5 mmol/L (ref 3.5–5.2)
Sodium: 148 mmol/L — ABNORMAL HIGH (ref 134–144)
Total Protein: 6.9 g/dL (ref 6.0–8.5)
eGFR: 77 mL/min/{1.73_m2} (ref >59)

## 2022-01-15 LAB — CBC
Hematocrit: 45.2 % (ref 37.5–51.0)
Hemoglobin: 15.1 g/dL (ref 13.0–17.7)
MCH: 31.2 pg (ref 26.6–33.0)
MCHC: 33.4 g/dL (ref 31.5–35.7)
MCV: 93 fL (ref 79–97)
Platelets: 150 10*3/uL (ref 150–450)
RBC: 4.84 x10E6/uL (ref 4.14–5.80)
RDW: 12.5 % (ref 11.6–15.4)
WBC: 6.1 10*3/uL (ref 3.4–10.8)

## 2022-01-15 LAB — URINALYSIS, ROUTINE W REFLEX MICROSCOPIC
Bilirubin, UA: NEGATIVE
Glucose, UA: NEGATIVE
Ketones, UA: NEGATIVE
Leukocytes,UA: NEGATIVE
Nitrite, UA: NEGATIVE
Protein,UA: NEGATIVE
RBC, UA: NEGATIVE
Specific Gravity, UA: 1.02 (ref 1.005–1.030)
Urobilinogen, Ur: 0.2 mg/dL (ref 0.2–1.0)
pH, UA: 5.5 (ref 5.0–7.5)

## 2022-01-15 LAB — PSA: Prostate Specific Ag, Serum: 2.7 ng/mL (ref 0.0–4.0)

## 2022-01-15 LAB — LIPID PANEL
Chol/HDL Ratio: 2.6 ratio (ref 0.0–5.0)
Cholesterol, Total: 108 mg/dL (ref 100–199)
HDL: 42 mg/dL (ref >39)
LDL Chol Calc (NIH): 46 mg/dL (ref 0–99)
Triglycerides: 105 mg/dL (ref 0–149)
VLDL Cholesterol Cal: 20 mg/dL (ref 5–40)

## 2022-01-15 NOTE — Progress Notes (Signed)
Subjective: CC:HTN, HLD PCP: [name], DO TDD:UKGURK [name] [name] is a 81 y.o. male presenting to clinic today for:  1.  Hypertension associate with hyperlipidemia, AAA, CAD Patient is compliant with medications.  He denies any chest pain, shortness of breath.  He tolerates the Repatha without difficulty and has follow-up with his cardiologist in December.  He notes that anxiety waxes and wanes but it is typically situational because he worries about the future and his wife being well taken care of when he is gone.  BuSpar otherwise is working well to control anxiety symptoms  2.  Family history of bladder cancer Father with personal history of bladder cancer.  Patient would like to have screening urinalysis but does not report any urinary symptoms today.   ROS: Per HPI  Allergies  Allergen Reactions   Levofloxacin     Caused C- Diff   Crestor [Rosuvastatin] Other (See Comments)    Liver problems   Vytorin [Ezetimibe-Simvastatin] Other (See Comments)    Liver problems   Past Medical History:  Diagnosis Date   AAA (abdominal aortic aneurysm) ([place])    gets abd [place] every 3 years to watch,last check 08/2018   Arthritis    BPH (benign prostatic hyperplasia)    CAD (coronary artery disease)    Colon polyps    Dyslipidemia    GERD (gastroesophageal reflux disease)    Heart murmur    Herniated disc    Hiatal hernia    History of kidney stones    HTN (hypertension)    Hyperlipidemia    OSA on CPAP    Tinnitus     Current Outpatient Medications:    aspirin EC 81 MG tablet, Take 81 mg by mouth daily. Swallow whole., Disp: , Rfl:    busPIRone (BUSPAR) 15 MG tablet, Take 1 tablet (15 mg total) by mouth 2 (two) times daily., Disp: 180 tablet, Rfl: 3   Cholecalciferol ([employer] VIT D 5000 HIGH-POTENCY PO), Take 1 tablet by mouth daily., Disp: , Rfl:    Digestive Enzymes (ENZYME DIGEST PO), Take 1 capsule by mouth at bedtime., Disp: , Rfl:    Evolocumab (REPATHA SURECLICK) 270  MG/ML SOAJ, INJECT 1 ML ('140MG'$ ) EVERY 2 WEEKS AS DIRECTED, Disp: 6 mL, Rfl: 3   fluticasone (FLONASE) 50 MCG/ACT nasal spray, Place 2 sprays into both nostrils daily as needed for allergies or rhinitis., Disp: 9.9 mL, Rfl: 3   mesalamine (LIALDA) 1.2 g EC tablet, Take 4.8 g by mouth every other day., Disp: , Rfl:    metoprolol tartrate (LOPRESSOR) 25 MG tablet, TAKE 1 TABLET TWICE A DAY, Disp: 180 tablet, Rfl: 3   omeprazole (PRILOSEC) 20 MG capsule, Take 20 mg by mouth daily., Disp: , Rfl:    Probiotic Product (PROBIOTIC PO), Take 1 capsule by mouth daily., Disp: , Rfl:    triamcinolone ointment (KENALOG) 0.5 %, Apply 1 application. topically 2 (two) times daily., Disp: 30 g, Rfl: 0   Turmeric (QC TUMERIC COMPLEX PO), Take by mouth daily., Disp: , Rfl:    nitroGLYCERIN (NITROSTAT) 0.4 MG SL tablet, Place 1 tablet (0.4 mg total) under the tongue every 5 (five) minutes as needed for chest pain., Disp: 25 tablet, Rfl: prn Social History   Socioeconomic History   Marital status: Married    Spouse name: Constance Holster   Number of children: 2   Years of education: Not on file   Highest education level: Bachelor's degree (e.g., BA, AB, BS)  Occupational History  Occupation: Armed forces technical officer for [employer]  Tobacco Use   Smoking status: Never   Smokeless tobacco: Never   Tobacco comments:    does not smoke   Vaping Use   Vaping Use: Never used  Substance and Sexual Activity   Alcohol use: Yes    Alcohol/week: 1.0 standard drink of alcohol    Types: 1 Glasses of wine per week    Comment: with meal   Drug use: No   Sexual activity: Yes  Other Topics Concern   Not on file  Social History Narrative   Married, 2 adult children and 4 grandchildren. Retired    Scientist, physiological Strain: [place]  (09/21/2021)   Overall Financial Resource Strain (CARDIA)    Difficulty of Paying Living Expenses: Not hard at all  Food Insecurity: No Food Insecurity  (09/21/2021)   Hunger Vital Sign    Worried About Running Out of Food in the Last Year: Never true    [place] in the Last Year: Never true  Transportation Needs: No Transportation Needs (09/21/2021)   PRAPARE - Hydrologist (Medical): No    Lack of Transportation (Non-Medical): No  Physical Activity: Sufficiently Active (09/21/2021)   Exercise Vital Sign    Days of Exercise per Week: 5 days    Minutes of Exercise per Session: 50 min  Stress: No Stress Concern Present (09/21/2021)   [place]    Feeling of Stress : Not at all  Social Connections: [place] (09/21/2021)   Social Connection and Isolation Panel [NHANES]    Frequency of Communication with Friends and Family: More than three times a week    Frequency of Social Gatherings with Friends and Family: More than three times a week    Attends Religious Services: More than 4 times per year    Active Member of [employer] or Organizations: Yes    Attends Music therapist: More than 4 times per year    Marital Status: Married  Human resources officer Violence: Not At Risk (09/21/2021)   Humiliation, Afraid, Rape, and Kick questionnaire    Fear of Current or Ex-Partner: No    Emotionally Abused: No    Physically Abused: No    Sexually Abused: No   Family History  Problem Relation Age of Onset   Heart disease Father    Cancer Father        Bladder Cancer   Heart attack Father    Colon polyps Father    Hypertension Mother    Alzheimer's disease Mother    Stroke Mother        3-4    Diabetes Brother    Heart disease Brother    Arthritis Brother        bilateral knee replacement, back issues    Pneumonia Sister    GI problems Sister        esophogus stretched    Crohn's disease Son     Objective: Office vital signs reviewed. BP 139/77   Pulse 69   Temp 98.3 F (36.8 C)   Ht '6\' 1"'$  (1.854 m)   Wt 215 lb 12.8 oz  (97.9 kg)   SpO2 96%   BMI 28.47 kg/m   Physical Examination:  General: Awake, alert, well nourished, No acute distress HEENT: sclera white, MMM Cardio: regular rate and rhythm, S1S2 heard, no murmurs appreciated Pulm: clear to auscultation bilaterally,  no wheezes, rhonchi or rales; normal work of breathing on room air    Assessment/ Plan: 81 y.o. male   Essential hypertension  Hyperlipidemia LDL goal <70  Benign prostatic hyperplasia, unspecified whether lower urinary tract symptoms present - Plan: Urinalysis, Routine w reflex microscopic  Family history of bladder cancer - Plan: Urinalysis, Routine w reflex microscopic  Blood pressure is controlled.  No changes.  We reviewed his labs during today's visit and I have highly recommended that he reduce salt intake.  Continue cholesterol regimen as outlined by his specialist.  We will CC lipid results to him.  PSA normal.   Screening urinalysis given family history of bladder cancer collected  No orders of the defined types were placed in this encounter.  No orders of the defined types were placed in this encounter.    [name], DO [place] [phone]

## 2022-01-24 DIAGNOSIS — G4733 Obstructive sleep apnea (adult) (pediatric): Secondary | ICD-10-CM | POA: Diagnosis not present

## 2022-02-05 DIAGNOSIS — G4733 Obstructive sleep apnea (adult) (pediatric): Secondary | ICD-10-CM | POA: Diagnosis not present

## 2022-02-05 DIAGNOSIS — R0989 Other specified symptoms and signs involving the circulatory and respiratory systems: Secondary | ICD-10-CM | POA: Insufficient documentation

## 2022-02-05 NOTE — Progress Notes (Unsigned)
Cardiology Office Note   Date:  02/06/2022   ID:  Nic, Lampe 26-Feb-1941, MRN [id number]  PCP:  [name], DO  Cardiologist:   Minus Breeding, MD   Chief Complaint  Patient presents with   Coronary Artery Disease      History of Present Illness: [name] [name] is a 81 y.o. male who presents for evaluation of CAD/CABG.   He had an echo in 2022 for murmur.  There was mild aortic sclerosis.  Since I last saw him he has had no new cardiovascular complaints.  He feels a little bit foggy in the morning but then he gets over that.  He golfs 3 times a week.  He exercises.  He is not having any other new cardiovascular symptoms.  He does not notice any palpitations, presyncope or syncope.  Is not having any chest pressure, neck or arm discomfort.  Has had no weight gain or edema.  He still getting the cough quite a bit.  Past Medical History:  Diagnosis Date   AAA (abdominal aortic aneurysm) ([place])    gets abd [place] every 3 years to watch,last check 08/2018   Arthritis    BPH (benign prostatic hyperplasia)    CAD (coronary artery disease)    Colon polyps    Dyslipidemia    GERD (gastroesophageal reflux disease)    Heart murmur    Herniated disc    Hiatal hernia    History of kidney stones    HTN (hypertension)    Hyperlipidemia    OSA on CPAP    Tinnitus     Past Surgical History:  Procedure Laterality Date   COLONOSCOPY     CORONARY ARTERY BYPASS GRAFT  2006   LIMA to LAD, SVG to RCA, SVG to circumflex, SVG to diagonal    mole removed  2003   stomach   right knee surgery  2008   TONSILLECTOMY     TOTAL KNEE ARTHROPLASTY Right 04/27/2018   Procedure: TOTAL KNEE ARTHROPLASTY RIGHT;  Surgeon: Gaynelle Arabian, MD;  Location: [facility];  Service: Orthopedics;  Laterality: Right;  21mn   TOTAL KNEE ARTHROPLASTY Left 05/31/2019   Procedure: TOTAL KNEE ARTHROPLASTY;  Surgeon: AGaynelle Arabian MD;  Location: [facility];  Service: Orthopedics;  Laterality: Left;   52m   UPPER GI ENDOSCOPY       Current Outpatient Medications  Medication Sig Dispense Refill   aspirin EC 81 MG tablet Take 81 mg by mouth daily. Swallow whole.     busPIRone (BUSPAR) 15 MG tablet Take 1 tablet (15 mg total) by mouth 2 (two) times daily. 180 tablet 3   Digestive Enzymes (ENZYME DIGEST PO) Take 1 capsule by mouth at bedtime.     Evolocumab (REPATHA SURECLICK) 14485G/ML SOAJ INJECT 1 ML ('140MG'$ ) EVERY 2 WEEKS AS DIRECTED 6 mL 3   fluticasone (FLONASE) 50 MCG/ACT nasal spray Place 2 sprays into both nostrils daily as needed for allergies or rhinitis. 9.9 mL 3   mesalamine (LIALDA) 1.2 g EC tablet Take 4.8 g by mouth every other day.     metoprolol tartrate (LOPRESSOR) 25 MG tablet TAKE 1 TABLET TWICE A DAY 180 tablet 3   omeprazole (PRILOSEC) 20 MG capsule Take 20 mg by mouth daily.     Probiotic Product (PROBIOTIC PO) Take 1 capsule by mouth daily.     triamcinolone ointment (KENALOG) 0.5 % Apply 1 application. topically 2 (two) times daily. 30 g 0   Turmeric (  QC TUMERIC COMPLEX PO) Take by mouth daily.     Cholecalciferol (CVS VIT D 5000 HIGH-POTENCY PO) Take 1 tablet by mouth daily.     nitroGLYCERIN (NITROSTAT) 0.4 MG SL tablet Place 1 tablet (0.4 mg total) under the tongue every 5 (five) minutes as needed for chest pain. 25 tablet prn   No current facility-administered medications for this visit.    Allergies:   Levofloxacin, Crestor [rosuvastatin], and Vytorin [ezetimibe-simvastatin]    ROS:  Please see the history of present illness.   Otherwise, review of systems are positive for easy bruising.   All other systems are reviewed and negative.    PHYSICAL EXAM: VS:  BP 136/74   Pulse (!) 56   Ht '6\' 1"'$  (1.854 m)   Wt 219 lb (99.3 kg)   SpO2 98%   BMI 28.89 kg/m  , BMI Body mass index is 28.89 kg/m. GENERAL:  Well appearing NECK:  No jugular venous distention, waveform within normal limits, carotid upstroke brisk and symmetric, no bruits, no  thyromegaly LUNGS:  Clear to auscultation bilaterally CHEST:  Well healed sternotomy scar. HEART:  PMI not displaced or sustained,S1 and S2 within normal limits, no S3, no S4, no clicks, no rubs, 2 out of 6 apical systolic murmur early peaking and radiating up the aortic outflow tract, no diastolic murmurs ABD:  Flat, positive bowel sounds normal in frequency in pitch, no bruits, no rebound, no guarding, no midline pulsatile mass, no hepatomegaly, no splenomegaly EXT:  2 plus pulses throughout, no edema, no cyanosis no clubbing   EKG:  EKG is  ordered today. The ekg ordered today demonstrates sinus rhythm, rate 56, axis within normal limits, intervals within normal limits, no acute ST-T wave changes.  Nonspecific inferior T wave inversion   Recent Labs: 03/22/2021: TSH 2.460 01/14/2022: ALT 21; BUN 18; Creatinine, Ser 0.99; Hemoglobin 15.1; Platelets 150; Potassium 4.5; Sodium 148    Lipid Panel    Component Value Date/Time   CHOL 108 01/14/2022 0833   CHOL 160 05/04/2013 0822   TRIG 105 01/14/2022 0833   TRIG 59 11/28/2016 0836   TRIG 92 05/04/2013 0822   HDL 42 01/14/2022 0833   HDL 54 11/28/2016 0836   HDL 55 05/04/2013 0822   CHOLHDL 2.6 01/14/2022 0833   LDLCALC 46 01/14/2022 0833   LDLCALC 149 (H) 08/05/2013 0851   LDLCALC 87 05/04/2013 0822      Wt Readings from Last 3 Encounters:  02/06/22 219 lb (99.3 kg)  01/15/22 215 lb 12.8 oz (97.9 kg)  01/08/22 221 lb 6.4 oz (100.4 kg)      Other studies Reviewed: Additional studies/ records that were reviewed today include:  Labs. Review of the above records demonstrates:  Please see elsewhere in the note.     ASSESSMENT AND PLAN:  AAA:  There was no evidence in this in June 2022.    BRUIT:   He had mild carotid stenosis and he did not need further testing.    CAD:    The patient has no new sypmtoms.  No further cardiovascular testing is indicated.  We will continue with aggressive risk reduction and meds as  listed.  HTN:  The blood pressure is at target.  No change in therapy.   DYSLIPIDEMIA:   His LDL was 46 with an HDL of 42.  No change in therapy.   AS: He has some very mild aortic stenosis.  His murmur is maybe a little more prominent.  I will  follow-up with an echocardiogram.   Current medicines are reviewed at length with the patient today.  The patient does not have concerns regarding medicines.  The following changes have been made: None  Labs/ tests ordered today include:  None  Orders Placed This Encounter  Procedures   EKG 12-Lead   ECHOCARDIOGRAM COMPLETE      Disposition:   FU with me in 12 months.     Signed, Minus Breeding, MD  02/06/2022 4:38 PM    [facility]

## 2022-02-06 ENCOUNTER — Ambulatory Visit: Payer: Medicare Other | Admitting: Cardiology

## 2022-02-06 ENCOUNTER — Encounter: Payer: Self-pay | Admitting: Cardiology

## 2022-02-06 VITALS — BP 136/74 | HR 56 | Ht 73.0 in | Wt 219.0 lb

## 2022-02-06 DIAGNOSIS — I10 Essential (primary) hypertension: Secondary | ICD-10-CM | POA: Diagnosis not present

## 2022-02-06 DIAGNOSIS — I350 Nonrheumatic aortic (valve) stenosis: Secondary | ICD-10-CM

## 2022-02-06 DIAGNOSIS — E785 Hyperlipidemia, unspecified: Secondary | ICD-10-CM | POA: Diagnosis not present

## 2022-02-06 DIAGNOSIS — R011 Cardiac murmur, unspecified: Secondary | ICD-10-CM | POA: Diagnosis not present

## 2022-02-06 DIAGNOSIS — I2510 Atherosclerotic heart disease of native coronary artery without angina pectoris: Secondary | ICD-10-CM

## 2022-02-06 DIAGNOSIS — R0989 Other specified symptoms and signs involving the circulatory and respiratory systems: Secondary | ICD-10-CM | POA: Diagnosis not present

## 2022-02-06 MED ORDER — NITROGLYCERIN 0.4 MG SL SUBL: 0.4000 mg | SUBLINGUAL_TABLET | SUBLINGUAL | 99 refills | Status: DC | PRN

## 2022-02-06 NOTE — Patient Instructions (Addendum)
Medication Instructions:  The current medical regimen is effective;  continue present plan and medications.  *If you need a refill on your cardiac medications before your next appointment, please call your pharmacy*   Testing/Procedures: Your physician has requested that you have an echocardiogram. Echocardiography is a painless test that uses sound waves to create images of your heart. It provides your doctor with information about the size and shape of your heart and how well your heart's chambers and valves are working. This procedure takes approximately one hour. There are no restrictions for this procedure. Please do NOT wear cologne, perfume, aftershave, or lotions (deodorant is allowed). Please arrive 15 minutes prior to your appointment time. You will be contacted to be scheduled for this test.   Follow-Up: At [facility], you and your health needs are our priority.  As part of our continuing mission to provide you with exceptional heart care, we have created designated Provider Care Teams.  These Care Teams include your primary Cardiologist (physician) and Advanced Practice Providers (APPs -  Physician Assistants and Nurse Practitioners) who all work together to provide you with the care you need, when you need it.  We recommend signing up for the patient portal called "MyChart".  Sign up information is provided on this After Visit Summary.  MyChart is used to connect with patients for Virtual Visits (Telemedicine).  Patients are able to view lab/test results, encounter notes, upcoming appointments, etc.  Non-urgent messages can be sent to your provider as well.   To learn more about what you can do with MyChart, go to [email or web address].    Your next appointment:   1 year(s)  The format for your next appointment:   In Person  Provider:   Minus Breeding, MD    Important Information About Sugar

## 2022-03-07 ENCOUNTER — Ambulatory Visit (HOSPITAL_COMMUNITY): Payer: Medicare Other | Attending: Cardiology

## 2022-03-07 DIAGNOSIS — I2510 Atherosclerotic heart disease of native coronary artery without angina pectoris: Secondary | ICD-10-CM | POA: Diagnosis not present

## 2022-03-07 DIAGNOSIS — R011 Cardiac murmur, unspecified: Secondary | ICD-10-CM

## 2022-03-07 DIAGNOSIS — I350 Nonrheumatic aortic (valve) stenosis: Secondary | ICD-10-CM | POA: Diagnosis not present

## 2022-03-07 LAB — ECHOCARDIOGRAM COMPLETE
Area-P 1/2: 3.85 cm2
P 1/2 time: 592 msec
S' Lateral: 3 cm

## 2022-03-19 ENCOUNTER — Other Ambulatory Visit (HOSPITAL_COMMUNITY): Payer: Self-pay

## 2022-03-22 ENCOUNTER — Ambulatory Visit (INDEPENDENT_AMBULATORY_CARE_PROVIDER_SITE_OTHER): Payer: Medicare Other

## 2022-03-22 DIAGNOSIS — Z23 Encounter for immunization: Secondary | ICD-10-CM

## 2022-03-22 NOTE — Progress Notes (Signed)
Patient here today to have Tenivac vaccine after picking up his chain saw and cutting his finger.

## 2022-04-07 NOTE — Progress Notes (Unsigned)
HPI male never smoker followed for OSA, complicated by CAD/CABG, HBP, hiatal hernia Unattended Home Sleep Test 05/09/14- AHI 24.9/ hr, desat to 85%, body weight 208 pounds Unattended Home Sleep Test 08/02/15-AHI 11.3/hour, desaturation to 76%, body weight 212.5 pounds -----------------------------------------------------------  04/05/21- 82 year old male never smoker followed for OSA, Insomnia complicated by CAD/CABG, HBP, hiatal hernia, BPH, CPAP auto 5-12/Adapt Download- compliance compliance 94%, AHI 1.2/ hr Body weight today-  Covid vax- 3 Phizer, 1 Moderna Flu vax-had -----Patient would like to get a new CPAP machine. Has some trouble sleeping at night. He is taking Buspar to try and help with that.  PCP has started Buspar and melatonin.  He is not sure how he feels about these yet.  Admits some anxiety about finances.  Download reviewed and comfort measures discussed.  04/09/22- 82 year old male never smoker followed for OSA, Insomnia complicated by CAD/CABG, HBP, hiatal hernia, BPH, CPAP auto 5-12/Adapt Download- compliance compliance  Body weight today-  Covid vax- 3 Phizer, 1 Moderna Flu vax-    ROS-see HPI     + = positive Constitutional:   No-   weight loss, night sweats, fevers, chills, fatigue, lassitude. HEENT:   No-  headaches, difficulty swallowing, tooth/dental problems, sore throat,       No-  sneezing, itching, ear ache, nasal congestion, post nasal drip,  CV:  No-   chest pain, orthopnea, PND, swelling in lower extremities, anasarca,                                                    dizziness, palpitations Resp: No-   shortness of breath with exertion or at rest.              No-   productive cough,  No non-productive cough,  No- coughing up of blood.              No-   change in color of mucus.  No- wheezing.   Skin: No-   rash or lesions. GI:  No-   heartburn, indigestion, abdominal pain, nausea, vomiting, diarrhea,                 change in bowel habits, loss of  appetite GU: No-   dysuria, change in color of urine, no urgency or frequency.  No- flank pain. MS:  + joint pain or swelling.  No- decreased range of motion.  No- back pain. Neuro-     nothing unusual Psych:  No- change in mood or affect. No depression or anxiety.  No memory loss.  OBJ- Physical Exam   + = positive General- Alert, Oriented, Affect-appropriate, Distress- none acute, tall, not obese Skin- rash-none, lesions- none, excoriation- none Lymphadenopathy- none Head- atraumatic            Eyes- Gross vision intact, PERRLA, conjunctivae and secretions clear            Ears- Hearing, canals-normal            Nose- Clear, no-Septal dev, mucus, polyps, erosion, perforation             Throat- Mallampati II-III , mucosa clear , drainage- none, tonsils- atrophic Neck- flexible , trachea midline, no stridor , thyroid nl, carotid no bruit Chest - symmetrical excursion , unlabored           Heart/CV- RRR ,  Murmur+ TR , no gallop  , no rub, nl s1 s2                           - JVD- none , edema- none, stasis changes- none, varices- none           Lung- clear to P&A, wheeze- none, cough- none , dullness-none, rub- none           Chest wall-  Abd-  Br/ Gen/ Rectal- Not done, not indicated Extrem- cyanosis- none, clubbing, none, atrophy- none, strength- nl,  Neuro- grossly intact to observation.

## 2022-04-09 ENCOUNTER — Encounter: Payer: Self-pay | Admitting: Internal Medicine

## 2022-04-09 ENCOUNTER — Ambulatory Visit: Payer: Medicare Other | Admitting: Internal Medicine

## 2022-04-09 VITALS — BP 122/70 | HR 54 | Ht 73.0 in | Wt 223.4 lb

## 2022-04-09 DIAGNOSIS — G4733 Obstructive sleep apnea (adult) (pediatric): Secondary | ICD-10-CM

## 2022-04-09 DIAGNOSIS — I2510 Atherosclerotic heart disease of native coronary artery without angina pectoris: Secondary | ICD-10-CM | POA: Diagnosis not present

## 2022-04-09 DIAGNOSIS — I2583 Coronary atherosclerosis due to lipid rich plaque: Secondary | ICD-10-CM | POA: Diagnosis not present

## 2022-04-09 NOTE — Assessment & Plan Note (Signed)
Benefits from CPAP with good compliance and control. Plan-continue CPAP auto 5-12

## 2022-04-09 NOTE — Assessment & Plan Note (Signed)
Continues to follow with cardiology.  No changes to report.

## 2022-04-09 NOTE — Patient Instructions (Signed)
We can continue CPAP auto 5-12. You are doing great with this.  Please call if we can help.

## 2022-04-10 DIAGNOSIS — Z87442 Personal history of urinary calculi: Secondary | ICD-10-CM | POA: Diagnosis not present

## 2022-04-10 DIAGNOSIS — R3915 Urgency of urination: Secondary | ICD-10-CM | POA: Diagnosis not present

## 2022-04-10 DIAGNOSIS — R311 Benign essential microscopic hematuria: Secondary | ICD-10-CM | POA: Diagnosis not present

## 2022-04-22 ENCOUNTER — Other Ambulatory Visit: Payer: Self-pay | Admitting: Family Medicine

## 2022-04-22 DIAGNOSIS — F418 Other specified anxiety disorders: Secondary | ICD-10-CM

## 2022-05-07 DIAGNOSIS — G4733 Obstructive sleep apnea (adult) (pediatric): Secondary | ICD-10-CM | POA: Diagnosis not present

## 2022-06-13 ENCOUNTER — Telehealth: Payer: Self-pay | Admitting: Pharmacist Clinician (PhC)/ Clinical Pharmacy Specialist

## 2022-06-13 NOTE — Telephone Encounter (Signed)
[employer] Grant renewed to 06/24/23 New ID # [id number]

## 2022-06-13 NOTE — Telephone Encounter (Signed)
Could you please do an updated PA for his Repatha

## 2022-06-14 ENCOUNTER — Other Ambulatory Visit (HOSPITAL_COMMUNITY): Payer: Self-pay

## 2022-06-14 NOTE — Telephone Encounter (Signed)
Per test claim PA is good till 03/04/23

## 2022-06-17 ENCOUNTER — Other Ambulatory Visit: Payer: Self-pay | Admitting: Cardiology

## 2022-07-08 ENCOUNTER — Other Ambulatory Visit: Payer: Self-pay | Admitting: Family Medicine

## 2022-07-08 DIAGNOSIS — F418 Other specified anxiety disorders: Secondary | ICD-10-CM

## 2022-07-09 DIAGNOSIS — R3915 Urgency of urination: Secondary | ICD-10-CM | POA: Diagnosis not present

## 2022-07-16 ENCOUNTER — Ambulatory Visit (INDEPENDENT_AMBULATORY_CARE_PROVIDER_SITE_OTHER): Payer: Medicare Other | Admitting: Family Medicine

## 2022-07-16 ENCOUNTER — Encounter: Payer: Self-pay | Admitting: Family Medicine

## 2022-07-16 VITALS — BP 142/78 | HR 64 | Temp 98.7°F | Ht 73.0 in | Wt 221.0 lb

## 2022-07-16 DIAGNOSIS — I10 Essential (primary) hypertension: Secondary | ICD-10-CM

## 2022-07-16 DIAGNOSIS — Z8052 Family history of malignant neoplasm of bladder: Secondary | ICD-10-CM | POA: Diagnosis not present

## 2022-07-16 DIAGNOSIS — D692 Other nonthrombocytopenic purpura: Secondary | ICD-10-CM

## 2022-07-16 DIAGNOSIS — E785 Hyperlipidemia, unspecified: Secondary | ICD-10-CM

## 2022-07-16 DIAGNOSIS — S80861A Insect bite (nonvenomous), right lower leg, initial encounter: Secondary | ICD-10-CM

## 2022-07-16 DIAGNOSIS — W57XXXA Bitten or stung by nonvenomous insect and other nonvenomous arthropods, initial encounter: Secondary | ICD-10-CM

## 2022-07-16 DIAGNOSIS — M545 Low back pain, unspecified: Secondary | ICD-10-CM

## 2022-07-16 DIAGNOSIS — H6121 Impacted cerumen, right ear: Secondary | ICD-10-CM

## 2022-07-16 MED ORDER — DOXYCYCLINE HYCLATE 100 MG PO TABS: ORAL_TABLET | ORAL | 0 refills | Status: DC

## 2022-07-16 MED ORDER — PREDNISONE 10 MG (21) PO TBPK: ORAL_TABLET | ORAL | 0 refills | Status: DC

## 2022-07-16 NOTE — Progress Notes (Unsigned)
Subjective: CC:*** PCP: Raliegh Ip, DO AVW:[id number] [name] [name] is a 82 y.o. male presenting to clinic today for:  1. ***   ROS: Per HPI  Allergies  Allergen Reactions   Levofloxacin     Caused C- Diff   Crestor [Rosuvastatin] Other (See Comments)    Liver problems   Vytorin [Ezetimibe-Simvastatin] Other (See Comments)    Liver problems   Past Medical History:  Diagnosis Date   AAA (abdominal aortic aneurysm) (HCC)    gets abd [place] every 3 years to watch,last check 08/2018   Arthritis    BPH (benign prostatic hyperplasia)    CAD (coronary artery disease)    Colon polyps    Dyslipidemia    GERD (gastroesophageal reflux disease)    Heart murmur    Herniated disc    Hiatal hernia    History of kidney stones    HTN (hypertension)    Hyperlipidemia    OSA on CPAP    Tinnitus     Current Outpatient Medications:    aspirin EC 81 MG tablet, Take 81 mg by mouth daily. Swallow whole., Disp: , Rfl:    busPIRone (BUSPAR) 15 MG tablet, TAKE ONE TABLET BY MOUTH TWICE DAILY, Disp: 180 tablet, Rfl: 0   Cholecalciferol ([employer]S VIT D 5000 HIGH-POTENCY PO), Take 1 tablet by mouth daily., Disp: , Rfl:    Digestive Enzymes (ENZYME DIGEST PO), Take 1 capsule by mouth at bedtime., Disp: , Rfl:    fluticasone (FLONASE) 50 MCG/ACT nasal spray, Place 2 sprays into both nostrils daily as needed for allergies or rhinitis., Disp: 9.9 mL, Rfl: 3   mesalamine (LIALDA) 1.2 g EC tablet, Take 4.8 g by mouth every other day., Disp: , Rfl:    metoprolol tartrate (LOPRESSOR) 25 MG tablet, TAKE 1 TABLET TWICE A DAY, Disp: 180 tablet, Rfl: 3   nitroGLYCERIN (NITROSTAT) 0.4 MG SL tablet, Place 1 tablet (0.4 mg total) under the tongue every 5 (five) minutes as needed for chest pain., Disp: 25 tablet, Rfl: prn   omeprazole (PRILOSEC) 20 MG capsule, Take 20 mg by mouth daily., Disp: , Rfl:    Probiotic Product (PROBIOTIC PO), Take 1 capsule by mouth daily., Disp: , Rfl:    REPATHA SURECLICK 140 MG/ML  SOAJ, INJECT 1 ML (140MG ) EVERY 2 WEEKS AS DIRECTED, Disp: 6 mL, Rfl: 3   triamcinolone ointment (KENALOG) 0.5 %, Apply 1 application. topically 2 (two) times daily., Disp: 30 g, Rfl: 0   Turmeric (QC TUMERIC COMPLEX PO), Take by mouth daily., Disp: , Rfl:  Social History   Socioeconomic History   Marital status: Married    Spouse name: Nelva Bush   Number of children: 2   Years of education: Not on file   Highest education level: Bachelor's degree (e.g., BA, AB, BS)  Occupational History   Occupation: Government social research officer for [employer]  Tobacco Use   Smoking status: Never   Smokeless tobacco: Never   Tobacco comments:    does not smoke   Vaping Use   Vaping Use: Never used  Substance and Sexual Activity   Alcohol use: Yes    Alcohol/week: 1.0 standard drink of alcohol    Types: 1 Glasses of wine per week    Comment: with meal   Drug use: No   Sexual activity: Yes  Other Topics Concern   Not on file  Social History Narrative   Married, 2 adult children and 4 grandchildren. Retired    International aid/development worker of Health  Financial Resource Strain: Low Risk  (09/21/2021)   Overall Financial Resource Strain (CARDIA)    Difficulty of Paying Living Expenses: Not hard at all  Food Insecurity: No Food Insecurity (09/21/2021)   Hunger Vital Sign    Worried About Running Out of Food in the Last Year: Never true    Ran Out of Food in the Last Year: Never true  Transportation Needs: No Transportation Needs (09/21/2021)   PRAPARE - Administrator, Civil Service (Medical): No    Lack of Transportation (Non-Medical): No  Physical Activity: Sufficiently Active (09/21/2021)   Exercise Vital Sign    Days of Exercise per Week: 5 days    Minutes of Exercise per Session: 50 min  Stress: No Stress Concern Present (09/21/2021)   [employer] of Occupational Health - Occupational Stress Questionnaire    Feeling of Stress : Not at all  Social Connections: Socially Integrated  (09/21/2021)   Social Connection and Isolation Panel [NHANES]    Frequency of Communication with Friends and Family: More than three times a week    Frequency of Social Gatherings with Friends and Family: More than three times a week    Attends Religious Services: More than 4 times per year    Active Member of [employer] or Organizations: Yes    Attends Engineer, structural: More than 4 times per year    Marital Status: Married  Catering manager Violence: Not At Risk (09/21/2021)   Humiliation, Afraid, Rape, and Kick questionnaire    Fear of Current or Ex-Partner: No    Emotionally Abused: No    Physically Abused: No    Sexually Abused: No   Family History  Problem Relation Age of Onset   Heart disease Father    Cancer Father        Bladder Cancer   Heart attack Father    Colon polyps Father    Hypertension Mother    Alzheimer's disease Mother    Stroke Mother        3-4    Diabetes Brother    Heart disease Brother    Arthritis Brother        bilateral knee replacement, back issues    Pneumonia Sister    GI problems Sister        esophogus stretched    Crohn's disease Son     Objective: Office vital signs reviewed. There were no vitals taken for this visit.  Physical Examination:  General: Awake, alert, *** nourished, No acute distress HEENT: Normal    Neck: No masses palpated. No lymphadenopathy    Ears: Tympanic membranes intact, normal light reflex, no erythema, no bulging    Eyes: PERRLA, extraocular membranes intact, sclera ***    Nose: nasal turbinates moist, *** nasal discharge    Throat: moist mucus membranes, no erythema, *** tonsillar exudate.  Airway is patent Cardio: regular rate and rhythm, S1S2 heard, no murmurs appreciated Pulm: clear to auscultation bilaterally, no wheezes, rhonchi or rales; normal work of breathing on room air GI: soft, non-tender, non-distended, bowel sounds present x4, no hepatomegaly, no splenomegaly, no masses GU: external  vaginal tissue ***, cervix ***, *** punctate lesions on cervix appreciated, *** discharge from cervical os, *** bleeding, *** cervical motion tenderness, *** abdominal/ adnexal masses Extremities: warm, well perfused, No edema, cyanosis or clubbing; +*** pulses bilaterally MSK: *** gait and *** station Skin: dry; intact; no rashes or lesions Neuro: *** Strength and light touch sensation grossly intact, *** DTRs ***/  4  Assessment/ Plan: 82 y.o. male   ***  No orders of the defined types were placed in this encounter.  No orders of the defined types were placed in this encounter.    Raliegh Ip, DO [facility] [phone]

## 2022-07-18 ENCOUNTER — Other Ambulatory Visit: Payer: Medicare Other

## 2022-07-18 DIAGNOSIS — D692 Other nonthrombocytopenic purpura: Secondary | ICD-10-CM | POA: Diagnosis not present

## 2022-07-18 DIAGNOSIS — E785 Hyperlipidemia, unspecified: Secondary | ICD-10-CM

## 2022-07-18 DIAGNOSIS — Z8052 Family history of malignant neoplasm of bladder: Secondary | ICD-10-CM | POA: Diagnosis not present

## 2022-07-18 DIAGNOSIS — I10 Essential (primary) hypertension: Secondary | ICD-10-CM | POA: Diagnosis not present

## 2022-07-18 LAB — CMP14+EGFR
ALT: 25 IU/L (ref 0–44)
AST: 28 IU/L (ref 0–40)
Albumin/Globulin Ratio: 2.1 (ref 1.2–2.2)
Albumin: 4.4 g/dL (ref 3.8–4.8)
Alkaline Phosphatase: 61 IU/L (ref 44–121)
BUN/Creatinine Ratio: 19 (ref 10–24)
BUN: 16 mg/dL (ref 8–27)
Bilirubin Total: 0.6 mg/dL (ref 0.0–1.2)
CO2: 23 mmol/L (ref 20–29)
Calcium: 9.3 mg/dL (ref 8.6–10.2)
Chloride: 105 mmol/L (ref 96–106)
Creatinine, Ser: 0.83 mg/dL (ref 0.76–1.27)
Globulin, Total: 2.1 g/dL (ref 1.5–4.5)
Glucose: 91 mg/dL (ref 70–99)
Potassium: 4.4 mmol/L (ref 3.5–5.2)
Sodium: 144 mmol/L (ref 134–144)
Total Protein: 6.5 g/dL (ref 6.0–8.5)
eGFR: 89 mL/min/{1.73_m2} (ref >59)

## 2022-07-18 LAB — LIPID PANEL
Chol/HDL Ratio: 2.2 ratio (ref 0.0–5.0)
Cholesterol, Total: 119 mg/dL (ref 100–199)
HDL: 55 mg/dL (ref >39)
LDL Chol Calc (NIH): 47 mg/dL (ref 0–99)
Triglycerides: 85 mg/dL (ref 0–149)
VLDL Cholesterol Cal: 17 mg/dL (ref 5–40)

## 2022-07-18 LAB — URINALYSIS
Bilirubin, UA: NEGATIVE
Glucose, UA: NEGATIVE
Ketones, UA: NEGATIVE
Leukocytes,UA: NEGATIVE
Nitrite, UA: NEGATIVE
Protein,UA: NEGATIVE
Specific Gravity, UA: 1.015 (ref 1.005–1.030)
Urobilinogen, Ur: 0.2 mg/dL (ref 0.2–1.0)
pH, UA: 7.5 (ref 5.0–7.5)

## 2022-07-18 LAB — CBC
Hematocrit: 41.8 % (ref 37.5–51.0)
Hemoglobin: 14 g/dL (ref 13.0–17.7)
MCH: 31.9 pg (ref 26.6–33.0)
MCHC: 33.5 g/dL (ref 31.5–35.7)
MCV: 95 fL (ref 79–97)
Platelets: 153 10*3/uL (ref 150–450)
RBC: 4.39 x10E6/uL (ref 4.14–5.80)
RDW: 12.2 % (ref 11.6–15.4)
WBC: 5.1 10*3/uL (ref 3.4–10.8)

## 2022-07-19 ENCOUNTER — Other Ambulatory Visit: Payer: Medicare Other

## 2022-07-19 ENCOUNTER — Other Ambulatory Visit: Payer: Self-pay

## 2022-07-19 DIAGNOSIS — R300 Dysuria: Secondary | ICD-10-CM | POA: Diagnosis not present

## 2022-07-19 LAB — MICROSCOPIC EXAMINATION
Bacteria, UA: NONE SEEN
Epithelial Cells (non renal): NONE SEEN /hpf (ref 0–10)
Renal Epithel, UA: NONE SEEN /hpf

## 2022-07-19 LAB — URINALYSIS, COMPLETE
Bilirubin, UA: NEGATIVE
Glucose, UA: NEGATIVE
Ketones, UA: NEGATIVE
Leukocytes,UA: NEGATIVE
Nitrite, UA: NEGATIVE
Protein,UA: NEGATIVE
Specific Gravity, UA: 1.02 (ref 1.005–1.030)
Urobilinogen, Ur: 0.2 mg/dL (ref 0.2–1.0)
pH, UA: 7 (ref 5.0–7.5)

## 2022-07-20 ENCOUNTER — Other Ambulatory Visit: Payer: Self-pay | Admitting: Cardiology

## 2022-07-22 LAB — URINE CULTURE: Organism ID, Bacteria: NO GROWTH

## 2022-08-05 ENCOUNTER — Telehealth: Payer: Self-pay | Admitting: Family Medicine

## 2022-08-05 DIAGNOSIS — G4733 Obstructive sleep apnea (adult) (pediatric): Secondary | ICD-10-CM | POA: Diagnosis not present

## 2022-08-05 NOTE — Telephone Encounter (Signed)
Patient has blister in between one of his toes and is concerned, he had a tick bite in the same place.  The blister has popped twice today and came back up.  Appointment scheduled in the morning at 8:05 am with Jerrel Ivory.

## 2022-08-06 ENCOUNTER — Encounter: Payer: Self-pay | Admitting: Family Medicine

## 2022-08-06 ENCOUNTER — Ambulatory Visit (INDEPENDENT_AMBULATORY_CARE_PROVIDER_SITE_OTHER): Payer: Medicare Other | Admitting: Family Medicine

## 2022-08-06 VITALS — BP 144/69 | HR 62 | Temp 98.2°F | Ht 73.0 in | Wt 219.0 lb

## 2022-08-06 DIAGNOSIS — W57XXXA Bitten or stung by nonvenomous insect and other nonvenomous arthropods, initial encounter: Secondary | ICD-10-CM

## 2022-08-06 DIAGNOSIS — S90862A Insect bite (nonvenomous), left foot, initial encounter: Secondary | ICD-10-CM

## 2022-08-06 DIAGNOSIS — S90922A Unspecified superficial injury of left foot, initial encounter: Secondary | ICD-10-CM | POA: Diagnosis not present

## 2022-08-06 DIAGNOSIS — T148XXA Other injury of unspecified body region, initial encounter: Secondary | ICD-10-CM

## 2022-08-06 MED ORDER — DOXYCYCLINE HYCLATE 100 MG PO TABS
100.0000 mg | ORAL_TABLET | Freq: Two times a day (BID) | ORAL | 0 refills | Status: AC
Start: 2022-08-06 — End: 2022-08-16

## 2022-08-06 NOTE — Patient Instructions (Addendum)
Complete the course of antibiotics   Gently wash the area with soap and water.  ?Do not pop or poke the blister with a sharp object unless your doctor or nurse tells you to. Opening the blister might make it more likely to get infected.  ?If the blister pops, keep the area clean and cover it with a bandage to protect it.  ?Do not scratch blisters. Scratching blisters makes them more likely to get infected. If you have itchy blisters, recommend OTC benadryl cream   Most blisters heal in about a week.  Doxycycline Prophylaxis: Oral: 200 mg as a single dose. Note: Prophylaxis is used only in patients who meet all of the following criteria: tick attached for ?36 hours, prophylaxis can be given within 72 hours of tick removal  Monitoring your BP at home   Your BP was elevated today in office  Please keep a log of your BP at home.  The best time to take BP is 1st thing in the morning after waking.  Sit for 5 minutes with feet flat on the floor, arm at heart level.  Options for BP cuffs are at [employer], [employer], [employer], [facility] & [facility].  We will review measurements at follow up and determine plan for BP management.  If you have access, you can send a message in MyChart with your measurements prior to your follow up appointment.  The brand I recommend to get is Omron (Bronze).

## 2022-08-06 NOTE — Progress Notes (Signed)
Acute Office Visit  Subjective:  Patient ID: [name] [name], male    DOB: Jul 18, 1940, 82 y.o.   MRN: [id number]  Chief Complaint  Patient presents with   Acute Visit    Bump left foot   HPI Patient is in today for bump on his left foot. He reports that he had a tick bite at the site approximately 5 days ago. States that the bump erupted 3-5 days ago. Thought at first that it was poison ivy erupting and put poison ivy scrub on it. He is worried about Lyme Disease because his right knee started aching starting yesterday. He reports that he did play golf. He had leftover doxycycline that he started for 2-3 days. He was taking one in the morning and one at night. Believes that he took 6 doxycycline total.   ROS As per HPI   Objective:  BP (!) 144/69   Pulse 62   Temp 98.2 F (36.8 C)   Wt 219 lb (99.3 kg)   SpO2 97%   BMI 28.89 kg/m   Physical Exam Constitutional:      General: He is awake. He is not in acute distress.    Appearance: Normal appearance. He is well-developed and well-groomed. He is not ill-appearing, toxic-appearing or diaphoretic.  Cardiovascular:     Rate and Rhythm: Normal rate.     Pulses: Normal pulses.          Radial pulses are 2+ on the right side and 2+ on the left side.       Posterior tibial pulses are 2+ on the right side and 2+ on the left side.     Heart sounds: Normal heart sounds. No murmur heard.    No gallop.  Pulmonary:     Effort: Pulmonary effort is normal. No respiratory distress.     Breath sounds: Normal breath sounds. No stridor. No wheezing, rhonchi or rales.  Musculoskeletal:     Cervical back: Full passive range of motion without pain and neck supple.     Right lower leg: No edema.     Left lower leg: No edema.  Skin:    General: Skin is warm.     Capillary Refill: Capillary refill takes less than 2 seconds.     Comments: ~83mm round vesicular wound on left foot at fourth toe. Draining clear fluid. Erythematous border.    Neurological:     General: No focal deficit present.     Mental Status: He is alert, oriented to person, place, and time and easily aroused. Mental status is at baseline.     GCS: GCS eye subscore is 4. GCS verbal subscore is 5. GCS motor subscore is 6.     Motor: No weakness.  Psychiatric:        Attention and Perception: Attention and perception normal.        Mood and Affect: Mood and affect normal.        Speech: Speech normal.        Behavior: Behavior normal. Behavior is cooperative.        Thought Content: Thought content normal. Thought content does not include homicidal or suicidal ideation. Thought content does not include homicidal or suicidal plan.        Cognition and Memory: Cognition and memory normal.        Judgment: Judgment normal.       Assessment & Plan:  1. Tick bite of left foot, initial encounter Will fill prescription for full  treatment to cover for erythema migrans for Lyme Disease. Suspect that patient is having a local reaction to tick bite. Discussed with patient that he can reserve his supply for prophylaxis use only. Instructed patient that it would be too early to test for lyme disease. Instructed  patient to follow up closely if symptoms do not improve.  - doxycycline (VIBRA-TABS) 100 MG tablet; Take 1 tablet (100 mg total) by mouth 2 (two) times daily for 10 days.  Dispense: 20 tablet; Refill: 0  2. Open wound of skin Provided wound care instructions for patient.   The above assessment and management plan was discussed with the patient. The patient verbalized understanding of and has agreed to the management plan using shared-decision making. Patient is aware to call the clinic if they develop any new symptoms or if symptoms fail to improve or worsen. Patient is aware when to return to the clinic for a follow-up visit. Patient educated on when it is appropriate to go to the emergency department.   Return if symptoms worsen or fail to  improve.  Neale Burly, DNP-FNP [facility] [address] [place], [place] [address] [phone]

## 2022-08-08 ENCOUNTER — Encounter: Payer: Self-pay | Admitting: Family Medicine

## 2022-08-13 ENCOUNTER — Encounter: Payer: Self-pay | Admitting: Family

## 2022-08-13 ENCOUNTER — Ambulatory Visit (INDEPENDENT_AMBULATORY_CARE_PROVIDER_SITE_OTHER): Payer: Medicare Other | Admitting: Family

## 2022-08-13 ENCOUNTER — Ambulatory Visit: Payer: Medicare Other

## 2022-08-13 VITALS — BP 153/74 | HR 56 | Temp 98.0°F | Ht 73.0 in | Wt 224.2 lb

## 2022-08-13 DIAGNOSIS — L989 Disorder of the skin and subcutaneous tissue, unspecified: Secondary | ICD-10-CM

## 2022-08-13 NOTE — Patient Instructions (Signed)
Excision of Skin Lesions Excision of a skin lesion is the removal of a section of skin by making small incisions in the skin. Through this process, the lesion is completely removed. This procedure is often done to treat or prevent cancer or infection. It may also be done to improve cosmetic appearance. You may have this procedure to remove: Cancerous (malignant) growths, such as basal cell carcinoma, squamous cell carcinoma, or melanoma. Noncancerous (benign) growths, such as a cyst or lipoma. Growths, such as moles or skin tags, which may be removed for cosmetic reasons. Various excision or surgical techniques may be used depending on your condition, the location of the lesion, and your overall health. Tell your health care provider about: Any allergies you have. All medicines you are taking, including vitamins, herbs, eye drops, creams, and over-the-counter medicines. Any problems you or family members have had with anesthetic medicines. Any bleeding problems you have. Any surgeries you have had. Any medical conditions you have. Whether you are pregnant or may be pregnant. What are the risks? Generally, this is a safe procedure. However, problems may occur, including: Bleeding. Infection. Scarring. Recurrence of the cyst, lipoma, or cancer. Allergic reaction to anesthetics, surgical materials, or ointments. Damage to nerves, blood vessels, muscles, or other structures. What happens before the procedure? Medicines Ask your health care provider about: Changing or stopping your regular medicines. This is especially important if you are taking diabetes medicines or blood thinners. Taking medicines such as aspirin and ibuprofen. These medicines can thin your blood. Do not take these medicines unless your health care provider tells you to take them. Taking over-the-counter medicines, vitamins, herbs, and supplements. General instructions Do not use any products that contain nicotine or  tobacco. These products include cigarettes, chewing tobacco, and vaping devices, such as e-cigarettes. If you need help quitting, ask your health care provider. Follow instructions from your health care provider about eating or drinking restrictions. Ask your health care provider: How your surgery site will be marked. What steps will be taken to help prevent infection. These steps may include: Removing hair at the surgery site. Washing skin with a germ-killing soap. Taking antibiotic medicine. Ask your health care provider if you will need someone to take you home from the hospital or clinic after the procedure. What happens during the procedure?  You will be given a medicine to numb the area (local anesthetic). Your health care provider will remove the lesions using one of the following excision techniques. Complete surgical excision. This procedure may be done to treat a cancerous growth or a noncancerous cyst or lesion. A small scalpel or scissors will be used to gently cut around and under the lesion until it is completely removed. If bleeding occurs, it will be stopped with a device that delivers heat (electrocautery). The edges of the wound may be stitched (sutured) together. A bandage (dressing) will be applied. Samples will be sent to a lab for testing. Excision of a cyst. An incision will be made on the cyst. The entire cyst will be removed through the incision. The incision may be closed with sutures. Shave excision. This may be done to remove a mole or other small growths. A small blade or scalpel will be used to shave off the lesion. The wound is usually left to heal on its own without sutures. The sample may be sent to a lab for testing. Punch excision. This may be done to completely remove a mole or other small growths. A small tool that  is like a cookie cutter or a hole punch is used to cut a circle shape out of the skin. The outer edges of the skin will be sutured  together. The sample may be sent to a lab for testing. Mohs micrographic surgery. This is usually done to treat skin cancer. This type of excision is mostly used on the face and ears. This procedure is minimally invasive, and it ensures the best cosmetic outcome. A scalpel or a loop instrument will be used to remove layers of the lesion until all the abnormal or cancerous tissue has been removed. The wound may be sutured, depending on its size. The tissue will be checked under a microscope right away. The procedure may vary among health care providers and hospitals. At the end of any of these procedures, antibiotic ointment will be applied as needed. What happens after the procedure? Talk with your health care provider to discuss any test results, treatment options, and if necessary, the need for more tests. Keep all follow-up visits. This is important. Summary Excision of a skin lesion is the removal of a section of skin by making small incisions in the skin. This procedure is often done to treat or prevent skin cancer, remove benign growths, or it may be done to improve cosmetic appearance. Various excision or surgical techniques may be used depending on your condition, the location of the lesion, and your overall health. After the procedure, talk with your health care provider to discuss any test results, treatment options, and if necessary, the need for more tests. Keep all follow-up visits. This is important. This information is not intended to replace advice given to you by your health care provider. Make sure you discuss any questions you have with your health care provider. Document Revised: 09/19/2020 Document Reviewed: 09/19/2020 Elsevier Patient Education  2024 [employer].

## 2022-08-13 NOTE — Progress Notes (Signed)
   Subjective:    Patient ID: [name] [name], male    DOB: [birth year], 82 y.o.   MRN: [id number]  Chief Complaint  Patient presents with   Blister    On left foot     HPI  Pt presents to the office today with a blister on left foot between fifth toe. Reports he removed the tick 08/03/22 and was seen in the office on 08/06/22 and given doxycyline 100 mg BID for 10 days.   Denies any fever, rash, new joint pain, rash, or headaches.   Review of Systems     Objective:   Physical Exam Vitals reviewed.  Constitutional:      General: He is not in acute distress.    Appearance: He is well-developed.  HENT:     Head: Normocephalic.  Eyes:     General:        Right eye: No discharge.        Left eye: No discharge.     Pupils: Pupils are equal, round, and reactive to light.  Neck:     Thyroid: No thyromegaly.  Cardiovascular:     Rate and Rhythm: Normal rate and regular rhythm.     Heart sounds: Normal heart sounds. No murmur heard. Pulmonary:     Effort: Pulmonary effort is normal. No respiratory distress.     Breath sounds: Normal breath sounds. No wheezing.  Abdominal:     General: Bowel sounds are normal. There is no distension.     Palpations: Abdomen is soft.     Tenderness: There is no abdominal tenderness.  Musculoskeletal:        General: No tenderness. Normal range of motion.     Cervical back: Normal range of motion and neck supple.  Skin:    General: Skin is warm and dry.     Findings: Wound present. No erythema or rash.          Comments: Skin lesion on left foot that is approx 1.X1 cm, no redness, discharge, or pain   Neurological:     Mental Status: He is alert and oriented to person, place, and time.     Cranial Nerves: No cranial nerve deficit.     Deep Tendon Reflexes: Reflexes are normal and symmetric.  Psychiatric:        Behavior: Behavior normal.        Thought Content: Thought content normal.        Judgment: Judgment normal.           BP (!) 163/74   Pulse (!) 56   Temp 98 F (36.7 C) (Temporal)   Ht 6\' 1"  (1.854 m)   Wt 224 lb 3.2 oz (101.7 kg)   SpO2 97%   BMI 29.58 kg/m   Assessment & Plan:   [name] comes in today with chief complaint of Blister (On left foot )   Diagnosis and orders addressed:  1. Skin lesion Lesion looks abnormal, no pain or infection present Referral to Derm placed to have removed and sent off Keep clean and dry  Report any s/s of infection  - Ambulatory referral to Dermatology   Jannifer Rodney, FNP

## 2022-08-15 DIAGNOSIS — H52203 Unspecified astigmatism, bilateral: Secondary | ICD-10-CM | POA: Diagnosis not present

## 2022-08-15 DIAGNOSIS — H25013 Cortical age-related cataract, bilateral: Secondary | ICD-10-CM | POA: Diagnosis not present

## 2022-08-15 DIAGNOSIS — H524 Presbyopia: Secondary | ICD-10-CM | POA: Diagnosis not present

## 2022-08-15 DIAGNOSIS — H2513 Age-related nuclear cataract, bilateral: Secondary | ICD-10-CM | POA: Diagnosis not present

## 2022-09-12 DIAGNOSIS — R311 Benign essential microscopic hematuria: Secondary | ICD-10-CM | POA: Diagnosis not present

## 2022-10-01 DIAGNOSIS — L570 Actinic keratosis: Secondary | ICD-10-CM | POA: Diagnosis not present

## 2022-10-01 DIAGNOSIS — X32XXXA Exposure to sunlight, initial encounter: Secondary | ICD-10-CM | POA: Diagnosis not present

## 2022-10-01 DIAGNOSIS — D225 Melanocytic nevi of trunk: Secondary | ICD-10-CM | POA: Diagnosis not present

## 2022-10-01 DIAGNOSIS — Z1283 Encounter for screening for malignant neoplasm of skin: Secondary | ICD-10-CM | POA: Diagnosis not present

## 2022-10-03 ENCOUNTER — Ambulatory Visit (INDEPENDENT_AMBULATORY_CARE_PROVIDER_SITE_OTHER): Payer: Medicare Other

## 2022-10-03 VITALS — Ht 73.0 in | Wt 218.0 lb

## 2022-10-03 DIAGNOSIS — Z0000 Encounter for general adult medical examination without abnormal findings: Secondary | ICD-10-CM | POA: Diagnosis not present

## 2022-10-03 NOTE — Patient Instructions (Signed)
[name] [name] , Thank you for taking time to come for your Medicare Wellness Visit. I appreciate your ongoing commitment to your health goals. Please review the following plan we discussed and let me know if I can assist you in the future.   Referrals/Orders/Follow-Ups/Clinician Recommendations: Aim for 30 minutes of exercise or brisk walking, 6-8 glasses of water, and 5 servings of fruits and vegetables each day.   This is a list of the screening recommended for you and due dates:  Health Maintenance  Topic Date Due   Hepatitis C Screening  Never done   COVID-19 Vaccine (5 - 2023-24 season) 07/03/2022   Flu Shot  10/03/2022   Medicare Annual Wellness Visit  10/03/2023   DTaP/Tdap/Td vaccine (4 - Td or Tdap) 03/22/2032   Pneumonia Vaccine  Completed   Zoster (Shingles) Vaccine  Completed   HPV Vaccine  Aged Out   Colon Cancer Screening  Discontinued    Advanced directives: (Copy Requested) Please bring a copy of your health care power of attorney and living will to the office to be added to your chart at your convenience.  Next Medicare Annual Wellness Visit scheduled for next year: Yes  Preventive Care 80 Years and Older, Male  Preventive care refers to lifestyle choices and visits with your health care provider that can promote health and wellness. What does preventive care include? A yearly physical exam. This is also called an annual well check. Dental exams once or twice a year. Routine eye exams. Ask your health care provider how often you should have your eyes checked. Personal lifestyle choices, including: Daily care of your teeth and gums. Regular physical activity. Eating a healthy diet. Avoiding tobacco and drug use. Limiting alcohol use. Practicing safe sex. Taking low doses of aspirin every day. Taking vitamin and mineral supplements as recommended by your health care provider. What happens during an annual well check? The services and screenings done by your health  care provider during your annual well check will depend on your age, overall health, lifestyle risk factors, and family history of disease. Counseling  Your health care provider may ask you questions about your: Alcohol use. Tobacco use. Drug use. Emotional well-being. Home and relationship well-being. Sexual activity. Eating habits. History of falls. Memory and ability to understand (cognition). Work and work Astronomer. Screening  You may have the following tests or measurements: Height, weight, and BMI. Blood pressure. Lipid and cholesterol levels. These may be checked every 5 years, or more frequently if you are over 54 years old. Skin check. Lung cancer screening. You may have this screening every year starting at age 6 if you have a 30-pack-year history of smoking and currently smoke or have quit within the past 15 years. Fecal occult blood test (FOBT) of the stool. You may have this test every year starting at age 31. Flexible sigmoidoscopy or colonoscopy. You may have a sigmoidoscopy every 5 years or a colonoscopy every 10 years starting at age 51. Prostate cancer screening. Recommendations will vary depending on your family history and other risks. Hepatitis C blood test. Hepatitis B blood test. Sexually transmitted disease (STD) testing. Diabetes screening. This is done by checking your blood sugar (glucose) after you have not eaten for a while (fasting). You may have this done every 1-3 years. Abdominal aortic aneurysm (AAA) screening. You may need this if you are a current or former smoker. Osteoporosis. You may be screened starting at age 78 if you are at high risk. Talk with  your health care provider about your test results, treatment options, and if necessary, the need for more tests. Vaccines  Your health care provider may recommend certain vaccines, such as: Influenza vaccine. This is recommended every year. Tetanus, diphtheria, and acellular pertussis (Tdap, Td)  vaccine. You may need a Td booster every 10 years. Zoster vaccine. You may need this after age 48. Pneumococcal 13-valent conjugate (PCV13) vaccine. One dose is recommended after age 37. Pneumococcal polysaccharide (PPSV23) vaccine. One dose is recommended after age 71. Talk to your health care provider about which screenings and vaccines you need and how often you need them. This information is not intended to replace advice given to you by your health care provider. Make sure you discuss any questions you have with your health care provider. Document Released: 03/17/2015 Document Revised: 11/08/2015 Document Reviewed: 12/20/2014 Elsevier Interactive Patient Education  2017 [employer].  Fall Prevention in the Home Falls can cause injuries. They can happen to people of all ages. There are many things you can do to make your home safe and to help prevent falls. What can I do on the outside of my home? Regularly fix the edges of walkways and driveways and fix any cracks. Remove anything that might make you trip as you walk through a door, such as a raised step or threshold. Trim any bushes or trees on the path to your home. Use bright outdoor lighting. Clear any walking paths of anything that might make someone trip, such as rocks or tools. Regularly check to see if handrails are loose or broken. Make sure that both sides of any steps have handrails. Any raised decks and porches should have guardrails on the edges. Have any leaves, snow, or ice cleared regularly. Use sand or salt on walking paths during winter. Clean up any spills in your garage right away. This includes oil or grease spills. What can I do in the bathroom? Use night lights. Install grab bars by the toilet and in the tub and shower. Do not use towel bars as grab bars. Use non-skid mats or decals in the tub or shower. If you need to sit down in the shower, use a plastic, non-slip stool. Keep the floor dry. Clean up any  water that spills on the floor as soon as it happens. Remove soap buildup in the tub or shower regularly. Attach bath mats securely with double-sided non-slip rug tape. Do not have throw rugs and other things on the floor that can make you trip. What can I do in the bedroom? Use night lights. Make sure that you have a light by your bed that is easy to reach. Do not use any sheets or blankets that are too big for your bed. They should not hang down onto the floor. Have a firm chair that has side arms. You can use this for support while you get dressed. Do not have throw rugs and other things on the floor that can make you trip. What can I do in the kitchen? Clean up any spills right away. Avoid walking on wet floors. Keep items that you use a lot in easy-to-reach places. If you need to reach something above you, use a strong step stool that has a grab bar. Keep electrical cords out of the way. Do not use floor polish or wax that makes floors slippery. If you must use wax, use non-skid floor wax. Do not have throw rugs and other things on the floor that can make you trip.  What can I do with my stairs? Do not leave any items on the stairs. Make sure that there are handrails on both sides of the stairs and use them. Fix handrails that are broken or loose. Make sure that handrails are as long as the stairways. Check any carpeting to make sure that it is firmly attached to the stairs. Fix any carpet that is loose or worn. Avoid having throw rugs at the top or bottom of the stairs. If you do have throw rugs, attach them to the floor with carpet tape. Make sure that you have a light switch at the top of the stairs and the bottom of the stairs. If you do not have them, ask someone to add them for you. What else can I do to help prevent falls? Wear shoes that: Do not have high heels. Have rubber bottoms. Are comfortable and fit you well. Are closed at the toe. Do not wear sandals. If you use a  stepladder: Make sure that it is fully opened. Do not climb a closed stepladder. Make sure that both sides of the stepladder are locked into place. Ask someone to hold it for you, if possible. Clearly mark and make sure that you can see: Any grab bars or handrails. First and last steps. Where the edge of each step is. Use tools that help you move around (mobility aids) if they are needed. These include: Canes. Walkers. Scooters. Crutches. Turn on the lights when you go into a dark area. Replace any light bulbs as soon as they burn out. Set up your furniture so you have a clear path. Avoid moving your furniture around. If any of your floors are uneven, fix them. If there are any pets around you, be aware of where they are. Review your medicines with your doctor. Some medicines can make you feel dizzy. This can increase your chance of falling. Ask your doctor what other things that you can do to help prevent falls. This information is not intended to replace advice given to you by your health care provider. Make sure you discuss any questions you have with your health care provider. Document Released: 12/15/2008 Document Revised: 07/27/2015 Document Reviewed: 03/25/2014 Elsevier Interactive Patient Education  2017 [employer].

## 2022-10-03 NOTE — Progress Notes (Signed)
Subjective:   [name] [name] is a 82 y.o. male who presents for Medicare Annual/Subsequent preventive examination.  Visit Complete: Virtual  I connected with  [name] on 10/03/22 by a audio enabled telemedicine application and verified that I am speaking with the correct person using two identifiers.  Patient Location: Home  Provider Location: Home Office  I discussed the limitations of evaluation and management by telemedicine. The patient expressed understanding and agreed to proceed.  Patient Medicare AWV questionnaire was completed by the patient on 10/03/2022; I have confirmed that all information answered by patient is correct and no changes since this date.  Review of Systems    Vital Signs: Unable to obtain new vitals due to this being a telehealth visit.  Cardiac Risk Factors include: advanced age (>31men, >52 women);dyslipidemia;male gender;hypertension     Objective:    Today's Vitals   10/03/22 1034  Weight: 218 lb (98.9 kg)  Height: 6\' 1"  (1.854 m)   Body mass index is 28.76 kg/m.     10/03/2022   10:39 AM 09/21/2021    3:36 PM 09/20/2020    2:14 PM 09/20/2019   11:10 AM 06/01/2019    3:54 PM 05/31/2019    3:09 PM 05/24/2019   11:19 AM  Advanced Directives  Does Patient Have a Medical Advance Directive? Yes Yes Yes Yes Yes Yes Yes  Type of Estate agent of [place];Living will Healthcare Power of [place];Living will Healthcare Power of [place];Living will Healthcare Power of [place];Living will Living will;Healthcare Power of [employer] Power of [place];Living will Healthcare Power of [place];Living will  Does patient want to make changes to medical advance directive? No - Patient declined  No - Patient declined No - Patient declined  No - Patient declined No - Patient declined  Copy of Healthcare Power of Attorney in Chart? Yes - validated most recent copy scanned in chart (See row information) Yes - validated  most recent copy scanned in chart (See row information) Yes - validated most recent copy scanned in chart (See row information) No - copy requested No - copy requested No - copy requested No - copy requested    Current Medications (verified) Outpatient Encounter Medications as of 10/03/2022  Medication Sig   aspirin EC 81 MG tablet Take 81 mg by mouth daily. Swallow whole.   busPIRone (BUSPAR) 15 MG tablet TAKE ONE TABLET BY MOUTH TWICE DAILY   Cholecalciferol ([employer] VIT D 5000 HIGH-POTENCY PO) Take 1 tablet by mouth daily.   Coenzyme Q10 50 MG CAPS 1 tab Orally twice a day   Digestive Enzymes (ENZYME DIGEST PO) Take 1 capsule by mouth at bedtime.   fluticasone (FLONASE) 50 MCG/ACT nasal spray Place 2 sprays into both nostrils daily as needed for allergies or rhinitis.   mesalamine (LIALDA) 1.2 g EC tablet Take 4.8 g by mouth every other day.   metoprolol tartrate (LOPRESSOR) 25 MG tablet TAKE ONE TABLET TWICE DAILY   Multiple Vitamin (MULTIVITAMINS PO) 1 tab Orally twice a day   omeprazole (PRILOSEC) 20 MG capsule Take 20 mg by mouth daily.   Probiotic Product (PROBIOTIC PO) Take 1 capsule by mouth daily.   REPATHA SURECLICK 140 MG/ML SOAJ INJECT 1 ML (140MG ) EVERY 2 WEEKS AS DIRECTED   tamsulosin (FLOMAX) 0.4 MG CAPS capsule Take 0.4 mg by mouth daily.   Turmeric (QC TUMERIC COMPLEX PO) Take by mouth daily.   nitroGLYCERIN (NITROSTAT) 0.4 MG SL tablet Place 1 tablet (0.4 mg total) under  the tongue every 5 (five) minutes as needed for chest pain.   No facility-administered encounter medications on file as of 10/03/2022.    Allergies (verified) Levofloxacin, Crestor [rosuvastatin], and Vytorin [ezetimibe-simvastatin]   History: Past Medical History:  Diagnosis Date   AAA (abdominal aortic aneurysm) (HCC)    gets abd [place] every 3 years to watch,last check 08/2018   Arthritis    BPH (benign prostatic hyperplasia)    CAD (coronary artery disease)    Colon polyps    Dyslipidemia    GERD  (gastroesophageal reflux disease)    Heart murmur    Herniated disc    Hiatal hernia    History of kidney stones    HTN (hypertension)    Hyperlipidemia    OSA on CPAP    Tinnitus    Past Surgical History:  Procedure Laterality Date   COLONOSCOPY     CORONARY ARTERY BYPASS GRAFT  2006   LIMA to LAD, SVG to RCA, SVG to circumflex, SVG to diagonal    mole removed  2003   stomach   right knee surgery  2008   TONSILLECTOMY     TOTAL KNEE ARTHROPLASTY Right 04/27/2018   Procedure: TOTAL KNEE ARTHROPLASTY RIGHT;  Surgeon: Ollen Gross, MD;  Location: [facility];  Service: Orthopedics;  Laterality: Right;    TOTAL KNEE ARTHROPLASTY Left 05/31/2019   Procedure: TOTAL KNEE ARTHROPLASTY;  Surgeon: Ollen Gross, MD;  Location: [facility];  Service: Orthopedics;  Laterality: Left;    UPPER GI ENDOSCOPY     Family History  Problem Relation Age of Onset   Heart disease Father    Cancer Father        Bladder Cancer   Heart attack Father    Colon polyps Father    Hypertension Mother    Alzheimer's disease Mother    Stroke Mother        3-4    Diabetes Brother    Heart disease Brother    Arthritis Brother        bilateral knee replacement, back issues    Pneumonia Sister    GI problems Sister        esophogus stretched    Crohn's disease Son    Social History   Socioeconomic History   Marital status: Married    Spouse name: Nelva Bush   Number of children: 2   Years of education: Not on file   Highest education level: Bachelor's degree (e.g., BA, AB, BS)  Occupational History   Occupation: Government social research officer for [employer]  Tobacco Use   Smoking status: Never   Smokeless tobacco: Never   Tobacco comments:    does not smoke   Vaping Use   Vaping status: Never Used  Substance and Sexual Activity   Alcohol use: Yes    Alcohol/week: 1.0 standard drink of alcohol    Types: 1 Glasses of wine per week    Comment: with meal   Drug use: No   Sexual activity: Yes   Other Topics Concern   Not on file  Social History Narrative   Married, 2 adult children and 4 grandchildren. Retired    Chemical engineer Strain: Patient Unable To Answer (10/03/2022)   Overall Financial Resource Strain (CARDIA)    Difficulty of Paying Living Expenses: Patient unable to answer  Food Insecurity: No Food Insecurity (10/03/2022)   Hunger Vital Sign    Worried About Running Out of Food in the Last  Year: Never true    Ran Out of Food in the Last Year: Never true  Transportation Needs: No Transportation Needs (10/03/2022)   PRAPARE - Administrator, Civil Service (Medical): No    Lack of Transportation (Non-Medical): No  Physical Activity: Insufficiently Active (10/03/2022)   Exercise Vital Sign    Days of Exercise per Week: 3 days    Minutes of Exercise per Session: 30 min  Stress: No Stress Concern Present (10/03/2022)   [employer] of Occupational Health - Occupational Stress Questionnaire    Feeling of Stress : Not at all  Social Connections: Socially Integrated (10/03/2022)   Social Connection and Isolation Panel [NHANES]    Frequency of Communication with Friends and Family: More than three times a week    Frequency of Social Gatherings with Friends and Family: Twice a week    Attends Religious Services: More than 4 times per year    Active Member of [employer] or Organizations: Yes    Attends Engineer, structural: More than 4 times per year    Marital Status: Married    Tobacco Counseling Counseling given: Not Answered Tobacco comments: does not smoke    Clinical Intake:  Pre-visit preparation completed: Yes  Pain : No/denies pain     Nutritional Risks: None Diabetes: No  How often do you need to have someone help you when you read instructions, pamphlets, or other written materials from your doctor or pharmacy?: 1 - Never  Interpreter Needed?: No  Information entered by :: Renie Ora,  LPN   Activities of Daily Living    10/03/2022   10:39 AM  In your present state of health, do you have any difficulty performing the following activities:  Hearing? 0  Vision? 0  Difficulty concentrating or making decisions? 0  Walking or climbing stairs? 0  Dressing or bathing? 0  Doing errands, shopping? 0  Preparing Food and eating ? N  Using the Toilet? N  In the past six months, have you accidently leaked urine? N  Do you have problems with loss of bowel control? N  Managing your Medications? N  Managing your Finances? N  Housekeeping or managing your Housekeeping? N    Patient Care Team: Raliegh Ip, DO as PCP - General (Family Medicine) Rollene Rotunda, MD as PCP - Cardiology (Cardiology) Jethro Bolus, MD as Consulting Physician (Ophthalmology) Vida Rigger, MD as Consulting Physician (Gastroenterology) Bjorn Pippin, MD as Attending Physician (Urology) Ollen Gross, MD as Consulting Physician (Orthopedic Surgery) Janalyn Harder, MD (Inactive) as Consulting Physician (Dermatology) Danella Maiers, [facility] (Pharmacist) Sallye Lat, MD as Consulting Physician (Ophthalmology)  Indicate any recent Medical Services you may have received from other than [facility] providers in the past year (date may be approximate).     Assessment:   This is a routine wellness examination for Tino.  Hearing/Vision screen Vision Screening - Comments:: Wears rx glasses - up to date with routine eye exams with  Dr.Groat   Dietary issues and exercise activities discussed:     Goals Addressed             This Visit's Progress    DIET - INCREASE WATER INTAKE   On track    Try to drink 6-8 glasses of water daily       Depression Screen    10/03/2022   10:37 AM 08/06/2022    8:20 AM 07/16/2022   10:06 AM 01/15/2022   11:45 AM 01/08/2022    2:11  PM 12/03/2021    1:05 PM 09/21/2021    3:34 PM  PHQ 2/9 Scores  PHQ - 2 Score 0 2 0 1 1 0 1  PHQ- 9 Score 0 4 0 4 6  3     Fall  Risk    10/03/2022   10:35 AM 08/13/2022    3:45 PM 08/06/2022    8:21 AM 07/16/2022    9:57 AM 01/15/2022   11:45 AM  Fall Risk   Falls in the past year? 0 0 0 0 0  Number falls in past yr: 0  0 0   Injury with Fall? 0  0 0   Risk for fall due to : No Fall Risks  No Fall Risks No Fall Risks   Follow up Falls prevention discussed  Falls evaluation completed Education provided     MEDICARE RISK AT HOME:  Medicare Risk at Home - 10/03/22 1035     Any stairs in or around the home? Yes    If so, are there any without handrails? No    Home free of loose throw rugs in walkways, pet beds, electrical cords, etc? Yes    Adequate lighting in your home to reduce risk of falls? Yes    Life alert? No    Use of a cane, walker or w/c? No    Grab bars in the bathroom? Yes    Shower chair or bench in shower? Yes    Elevated toilet seat or a handicapped toilet? Yes             TIMED UP AND GO:  Was the test performed?  No    Cognitive Function:    09/02/2017   11:04 AM 07/25/2016   10:54 AM  MMSE - Mini Mental State Exam  Orientation to time 5 5  Orientation to Place 5 5  Registration 3 3  Attention/ Calculation 5 5  Recall 2 3  Language- name 2 objects 2 2  Language- repeat 1 1  Language- follow 3 step command 3 3  Language- read & follow direction 1 1  Write a sentence 1 1  Copy design 1 1  Total score 29 30        10/03/2022   10:39 AM 09/21/2021    3:37 PM 09/20/2020    2:22 PM 09/20/2019   11:05 AM 09/18/2018    2:22 PM  6CIT Screen  What Year? 0 points 0 points 0 points 0 points 0 points  What month? 0 points 0 points 0 points 0 points 0 points  What time? 0 points 0 points 0 points 0 points 0 points  Count back from 20 0 points 0 points 0 points 0 points 0 points  Months in reverse 0 points 0 points 0 points 0 points 0 points  Repeat phrase 0 points 0 points 0 points 0 points 0 points  Total Score 0 points 0 points 0 points 0 points 0 points     Immunizations Immunization History  Administered Date(s) Administered   Covid-19, Mrna,Vaccine(Spikevax)78yrs and older 05/08/2022   Fluad Quad(high Dose 65+) 12/02/2018, 12/21/2019, 12/11/2020, 12/03/2021   Influenza, High Dose Seasonal PF 12/21/2015, 12/05/2016, 12/23/2017   Influenza,inj,Quad PF,6+ Mos 12/03/2012, 12/30/2013, 01/03/2015   Influenza,inj,quad, With Preservative 12/02/2016   Moderna SARS-COV2 Booster Vaccination 09/12/2020   PFIZER(Purple Top)SARS-COV-2 Vaccination 03/23/2019, 04/15/2019, 01/05/2020   Pneumococcal Conjugate-13 01/12/2014   Pneumococcal Polysaccharide-23 10/01/2012   Td 03/22/2022   Tdap 07/03/2010, 01/17/2011   Zoster Recombinant(Shingrix) 07/25/2016, 01/09/2017  TDAP status: Up to date  Flu Vaccine status: Up to date  Pneumococcal vaccine status: Up to date  Covid-19 vaccine status: Completed vaccines  Qualifies for Shingles Vaccine? Yes   Zostavax completed Yes   Shingrix Completed?: Yes  Screening Tests Health Maintenance  Topic Date Due   Hepatitis C Screening  Never done   COVID-19 Vaccine (5 - 2023-24 season) 07/03/2022   INFLUENZA VACCINE  10/03/2022   Medicare Annual Wellness (AWV)  10/03/2023   DTaP/Tdap/Td (4 - Td or Tdap) 03/22/2032   Pneumonia Vaccine 56+ Years old  Completed   Zoster Vaccines- Shingrix  Completed   HPV VACCINES  Aged Out   Colonoscopy  Discontinued    Health Maintenance  Health Maintenance Due  Topic Date Due   Hepatitis C Screening  Never done   COVID-19 Vaccine (5 - 2023-24 season) 07/03/2022   INFLUENZA VACCINE  10/03/2022    Colorectal cancer screening: No longer required.   Lung Cancer Screening: (Low Dose CT Chest recommended if Age 1-80 years, 20 pack-year currently smoking OR have quit w/in 15years.) does not qualify.   Lung Cancer Screening Referral: n/a  Additional Screening:  Hepatitis C Screening: does not qualify;   Vision Screening: Recommended annual ophthalmology  exams for early detection of glaucoma and other disorders of the eye. Is the patient up to date with their annual eye exam?  Yes  Who is the provider or what is the name of the office in which the patient attends annual eye exams? Dr.Groat  If pt is not established with a provider, would they like to be referred to a provider to establish care? No .   Dental Screening: Recommended annual dental exams for proper oral hygiene    Community Resource Referral / Chronic Care Management: CRR required this visit?  No   CCM required this visit?  No     Plan:     I have personally reviewed and noted the following in the patient's chart:   Medical and social history Use of alcohol, tobacco or illicit drugs  Current medications and supplements including opioid prescriptions. Patient is not currently taking opioid prescriptions. Functional ability and status Nutritional status Physical activity Advanced directives List of other physicians Hospitalizations, surgeries, and ER visits in previous 12 months Vitals Screenings to include cognitive, depression, and falls Referrals and appointments  In addition, I have reviewed and discussed with patient certain preventive protocols, quality metrics, and best practice recommendations. A written personalized care plan for preventive services as well as general preventive health recommendations were provided to patient.     Lorrene Reid, LPN   04/04/3084   After Visit Summary: (MyChart) Due to this being a telephonic visit, the after visit summary with patients personalized plan was offered to patient via MyChart   Nurse Notes: none

## 2022-10-14 ENCOUNTER — Other Ambulatory Visit: Payer: Self-pay | Admitting: Family Medicine

## 2022-10-14 DIAGNOSIS — F418 Other specified anxiety disorders: Secondary | ICD-10-CM

## 2022-11-05 DIAGNOSIS — K52832 Lymphocytic colitis: Secondary | ICD-10-CM | POA: Diagnosis not present

## 2022-11-05 DIAGNOSIS — R194 Change in bowel habit: Secondary | ICD-10-CM | POA: Diagnosis not present

## 2022-11-05 DIAGNOSIS — K219 Gastro-esophageal reflux disease without esophagitis: Secondary | ICD-10-CM | POA: Diagnosis not present

## 2023-02-07 ENCOUNTER — Other Ambulatory Visit: Payer: Medicare Other

## 2023-02-07 DIAGNOSIS — I10 Essential (primary) hypertension: Secondary | ICD-10-CM | POA: Diagnosis not present

## 2023-02-07 DIAGNOSIS — E785 Hyperlipidemia, unspecified: Secondary | ICD-10-CM

## 2023-02-07 DIAGNOSIS — R7303 Prediabetes: Secondary | ICD-10-CM | POA: Diagnosis not present

## 2023-02-07 DIAGNOSIS — N400 Enlarged prostate without lower urinary tract symptoms: Secondary | ICD-10-CM

## 2023-02-07 LAB — BAYER DCA HB A1C WAIVED: HB A1C (BAYER DCA - WAIVED): 5.5 % (ref 4.8–5.6)

## 2023-02-08 LAB — CMP14+EGFR
ALT: 18 [IU]/L (ref 0–44)
AST: 20 [IU]/L (ref 0–40)
Albumin: 4.3 g/dL (ref 3.8–4.8)
Alkaline Phosphatase: 61 [IU]/L (ref 44–121)
BUN/Creatinine Ratio: 23 (ref 10–24)
BUN: 21 mg/dL (ref 8–27)
Bilirubin Total: 0.5 mg/dL (ref 0.0–1.2)
CO2: 26 mmol/L (ref 20–29)
Calcium: 9.2 mg/dL (ref 8.6–10.2)
Chloride: 105 mmol/L (ref 96–106)
Creatinine, Ser: 0.9 mg/dL (ref 0.76–1.27)
Globulin, Total: 2.4 g/dL (ref 1.5–4.5)
Glucose: 97 mg/dL (ref 70–99)
Potassium: 4.3 mmol/L (ref 3.5–5.2)
Sodium: 142 mmol/L (ref 134–144)
Total Protein: 6.7 g/dL (ref 6.0–8.5)
eGFR: 86 mL/min/{1.73_m2} (ref >59)

## 2023-02-08 LAB — LIPID PANEL
Chol/HDL Ratio: 2.6 {ratio} (ref 0.0–5.0)
Cholesterol, Total: 130 mg/dL (ref 100–199)
HDL: 50 mg/dL (ref >39)
LDL Chol Calc (NIH): 65 mg/dL (ref 0–99)
Triglycerides: 76 mg/dL (ref 0–149)
VLDL Cholesterol Cal: 15 mg/dL (ref 5–40)

## 2023-02-08 LAB — CBC WITH DIFFERENTIAL/PLATELET
Basophils Absolute: 0 10*3/uL (ref 0.0–0.2)
Basos: 1 %
EOS (ABSOLUTE): 0.1 10*3/uL (ref 0.0–0.4)
Eos: 1 %
Hematocrit: 43.6 % (ref 37.5–51.0)
Hemoglobin: 14.5 g/dL (ref 13.0–17.7)
Immature Grans (Abs): 0 10*3/uL (ref 0.0–0.1)
Immature Granulocytes: 0 %
Lymphocytes Absolute: 1.5 10*3/uL (ref 0.7–3.1)
Lymphs: 26 %
MCH: 32.3 pg (ref 26.6–33.0)
MCHC: 33.3 g/dL (ref 31.5–35.7)
MCV: 97 fL (ref 79–97)
Monocytes Absolute: 0.5 10*3/uL (ref 0.1–0.9)
Monocytes: 9 %
Neutrophils Absolute: 3.6 10*3/uL (ref 1.4–7.0)
Neutrophils: 63 %
Platelets: 139 10*3/uL — ABNORMAL LOW (ref 150–450)
RBC: 4.49 x10E6/uL (ref 4.14–5.80)
RDW: 12.4 % (ref 11.6–15.4)
WBC: 5.6 10*3/uL (ref 3.4–10.8)

## 2023-02-08 LAB — PSA: Prostate Specific Ag, Serum: 3.6 ng/mL (ref 0.0–4.0)

## 2023-02-11 ENCOUNTER — Ambulatory Visit: Payer: Medicare Other | Admitting: Family Medicine

## 2023-02-11 ENCOUNTER — Encounter: Payer: Self-pay | Admitting: Family Medicine

## 2023-02-11 ENCOUNTER — Ambulatory Visit (INDEPENDENT_AMBULATORY_CARE_PROVIDER_SITE_OTHER): Payer: Medicare Other

## 2023-02-11 VITALS — BP 132/76 | HR 60 | Temp 98.4°F | Ht 73.0 in | Wt 223.0 lb

## 2023-02-11 DIAGNOSIS — N400 Enlarged prostate without lower urinary tract symptoms: Secondary | ICD-10-CM

## 2023-02-11 DIAGNOSIS — K117 Disturbances of salivary secretion: Secondary | ICD-10-CM | POA: Diagnosis not present

## 2023-02-11 DIAGNOSIS — Z8052 Family history of malignant neoplasm of bladder: Secondary | ICD-10-CM | POA: Diagnosis not present

## 2023-02-11 DIAGNOSIS — Z0001 Encounter for general adult medical examination with abnormal findings: Secondary | ICD-10-CM

## 2023-02-11 DIAGNOSIS — Z0000 Encounter for general adult medical examination without abnormal findings: Secondary | ICD-10-CM

## 2023-02-11 DIAGNOSIS — E785 Hyperlipidemia, unspecified: Secondary | ICD-10-CM

## 2023-02-11 DIAGNOSIS — I10 Essential (primary) hypertension: Secondary | ICD-10-CM | POA: Diagnosis not present

## 2023-02-11 DIAGNOSIS — Z77090 Contact with and (suspected) exposure to asbestos: Secondary | ICD-10-CM | POA: Diagnosis not present

## 2023-02-11 DIAGNOSIS — D696 Thrombocytopenia, unspecified: Secondary | ICD-10-CM | POA: Diagnosis not present

## 2023-02-11 LAB — URINALYSIS, ROUTINE W REFLEX MICROSCOPIC
Bilirubin, UA: NEGATIVE
Glucose, UA: NEGATIVE
Ketones, UA: NEGATIVE
Leukocytes,UA: NEGATIVE
Nitrite, UA: NEGATIVE
Protein,UA: NEGATIVE
RBC, UA: NEGATIVE
Specific Gravity, UA: 1.02 (ref 1.005–1.030)
Urobilinogen, Ur: 0.2 mg/dL (ref 0.2–1.0)
pH, UA: 7 (ref 5.0–7.5)

## 2023-02-11 NOTE — Progress Notes (Signed)
[name] [name] is a 82 y.o. male presents to office today for annual physical exam examination.    Concerns today include: 1.  Dry mouth Reports that he has been having dry mouth over the last couple of months.  He started using some type of over-the-counter sialagogue that he tucks under his lip.  This does seem to help but he just wants to make sure this does not need further evaluation.  He reports no changes in medications.  He reports no difficulty swallowing.  He is tolerating p.o. intake without difficulty.  Wears a CPAP machine that is a nasal pillow only.  Will be seeing his pulmonologist soon for follow-up on this.  To his knowledge he is not mouth breathing at nighttime but they have installed a humidifier for the room to add moisture.  His CPAP is also humidified  Occupation: Retired, Marital status: Married to [place], Substance use: 1-2 glasses of wine per day There are no preventive care reminders to display for this patient. Refills needed today: None  Immunization History  Administered Date(s) Administered   Fluad Quad(high Dose 65+) 12/02/2018, 12/21/2019, 12/11/2020, 12/03/2021   Fluad Trivalent(High Dose 65+) 12/09/2022   Influenza, High Dose Seasonal PF 12/21/2015, 12/05/2016, 12/23/2017   Influenza,inj,Quad PF,6+ Mos 12/03/2012, 12/30/2013, 01/03/2015   Influenza,inj,quad, With Preservative 12/02/2016   Moderna Covid-19 Fall Seasonal Vaccine 37yrs & older 05/08/2022, 12/31/2022   Moderna SARS-COV2 Booster Vaccination 09/12/2020   PFIZER(Purple Top)SARS-COV-2 Vaccination 03/23/2019, 04/15/2019, 01/05/2020   Pneumococcal Conjugate-13 01/12/2014   Pneumococcal Polysaccharide-23 10/01/2012   Respiratory Syncytial Virus Vaccine,Recomb Aduvanted(Arexvy) 12/09/2022   Td 03/22/2022   Tdap 07/03/2010, 01/17/2011   Zoster Recombinant(Shingrix) 07/25/2016, 01/09/2017   Past Medical History:  Diagnosis Date   AAA (abdominal aortic aneurysm) (HCC)    gets abd [place] every 3  years to watch,last check 08/2018   Arthritis    BPH (benign prostatic hyperplasia)    CAD (coronary artery disease)    Colon polyps    Dyslipidemia    GERD (gastroesophageal reflux disease)    Heart murmur    Herniated disc    Hiatal hernia    History of kidney stones    HTN (hypertension)    Hyperlipidemia    OSA on CPAP    Tinnitus    Social History   Socioeconomic History   Marital status: Married    Spouse name: Nelva Bush   Number of children: 2   Years of education: Not on file   Highest education level: Bachelor's degree (e.g., BA, AB, BS)  Occupational History   Occupation: Government social research officer for [employer]  Tobacco Use   Smoking status: Never   Smokeless tobacco: Never   Tobacco comments:    does not smoke   Vaping Use   Vaping status: Never Used  Substance and Sexual Activity   Alcohol use: Yes    Alcohol/week: 1.0 standard drink of alcohol    Types: 1 Glasses of wine per week    Comment: with meal   Drug use: No   Sexual activity: Yes  Other Topics Concern   Not on file  Social History Narrative   Married, 2 adult children and 4 grandchildren. Retired    Chemical engineer Strain: Patient Unable To Answer (10/03/2022)   Overall Financial Resource Strain (CARDIA)    Difficulty of Paying Living Expenses: Patient unable to answer  Food Insecurity: No Food Insecurity (10/03/2022)   Hunger Vital Sign    Worried About  Running Out of Food in the Last Year: Never true    Ran Out of Food in the Last Year: Never true  Transportation Needs: No Transportation Needs (10/03/2022)   PRAPARE - Administrator, Civil Service (Medical): No    Lack of Transportation (Non-Medical): No  Physical Activity: Insufficiently Active (10/03/2022)   Exercise Vital Sign    Days of Exercise per Week: 3 days    Minutes of Exercise per Session: 30 min  Stress: No Stress Concern Present (10/03/2022)   [employer] of Occupational  Health - Occupational Stress Questionnaire    Feeling of Stress : Not at all  Social Connections: Socially Integrated (10/03/2022)   Social Connection and Isolation Panel [NHANES]    Frequency of Communication with Friends and Family: More than three times a week    Frequency of Social Gatherings with Friends and Family: Twice a week    Attends Religious Services: More than 4 times per year    Active Member of [employer] or Organizations: Yes    Attends Engineer, structural: More than 4 times per year    Marital Status: Married  Catering manager Violence: Not At Risk (10/03/2022)   Humiliation, Afraid, Rape, and Kick questionnaire    Fear of Current or Ex-Partner: No    Emotionally Abused: No    Physically Abused: No    Sexually Abused: No   Past Surgical History:  Procedure Laterality Date   COLONOSCOPY     CORONARY ARTERY BYPASS GRAFT  2006   LIMA to LAD, SVG to RCA, SVG to circumflex, SVG to diagonal    mole removed  2003   stomach   right knee surgery  2008   TONSILLECTOMY     TOTAL KNEE ARTHROPLASTY Right 04/27/2018   Procedure: TOTAL KNEE ARTHROPLASTY RIGHT;  Surgeon: Ollen Gross, MD;  Location: [facility];  Service: Orthopedics;  Laterality: Right;    TOTAL KNEE ARTHROPLASTY Left 05/31/2019   Procedure: TOTAL KNEE ARTHROPLASTY;  Surgeon: Ollen Gross, MD;  Location: [facility];  Service: Orthopedics;  Laterality: Left;    UPPER GI ENDOSCOPY     Family History  Problem Relation Age of Onset   Heart disease Father    Cancer Father        Bladder Cancer   Heart attack Father    Colon polyps Father    Hypertension Mother    Alzheimer's disease Mother    Stroke Mother        3-4    Diabetes Brother    Heart disease Brother    Arthritis Brother        bilateral knee replacement, back issues    Pneumonia Sister    GI problems Sister        esophogus stretched    Crohn's disease Son     Current Outpatient Medications:    aspirin EC 81 MG tablet, Take 81 mg  by mouth daily. Swallow whole., Disp: , Rfl:    busPIRone (BUSPAR) 15 MG tablet, TAKE ONE TABLET BY MOUTH TWICE DAILY, Disp: 180 tablet, Rfl: 1   Cholecalciferol ([employer] VIT D 5000 HIGH-POTENCY PO), Take 1 tablet by mouth daily., Disp: , Rfl:    Coenzyme Q10 50 MG CAPS, 1 tab Orally twice a day, Disp: , Rfl:    Digestive Enzymes (ENZYME DIGEST PO), Take 1 capsule by mouth at bedtime., Disp: , Rfl:    fluticasone (FLONASE) 50 MCG/ACT nasal spray, Place 2 sprays into both nostrils daily  as needed for allergies or rhinitis., Disp: 9.9 mL, Rfl: 3   mesalamine (LIALDA) 1.2 g EC tablet, Take 4.8 g by mouth every other day., Disp: , Rfl:    metoprolol tartrate (LOPRESSOR) 25 MG tablet, TAKE ONE TABLET TWICE DAILY, Disp: 180 tablet, Rfl: 3   Multiple Vitamin (MULTIVITAMINS PO), 1 tab Orally twice a day, Disp: , Rfl:    nitroGLYCERIN (NITROSTAT) 0.4 MG SL tablet, Place 1 tablet (0.4 mg total) under the tongue every 5 (five) minutes as needed for chest pain., Disp: 25 tablet, Rfl: prn   omeprazole (PRILOSEC) 20 MG capsule, Take 20 mg by mouth daily., Disp: , Rfl:    Probiotic Product (PROBIOTIC PO), Take 1 capsule by mouth daily., Disp: , Rfl:    REPATHA SURECLICK 140 MG/ML SOAJ, INJECT 1 ML (140MG ) EVERY 2 WEEKS AS DIRECTED, Disp: 6 mL, Rfl: 3   tamsulosin (FLOMAX) 0.4 MG CAPS capsule, Take 0.4 mg by mouth daily., Disp: , Rfl:    Turmeric (QC TUMERIC COMPLEX PO), Take by mouth daily., Disp: , Rfl:   Allergies  Allergen Reactions   Levofloxacin     Caused C- Diff   Crestor [Rosuvastatin] Other (See Comments)    Liver problems   Vytorin [Ezetimibe-Simvastatin] Other (See Comments)    Liver problems     ROS: Review of Systems Pertinent items noted in HPI and remainder of comprehensive ROS otherwise negative.    Physical exam BP 132/76   Pulse 60   Temp 98.4 F (36.9 C)   Ht 6\' 1"  (1.854 m)   Wt 223 lb (101.2 kg)   SpO2 97%   BMI 29.42 kg/m  General appearance: alert, cooperative, appears  stated age, and no distress Head: Normocephalic, without obvious abnormality, atraumatic Eyes: negative findings: lids and lashes normal, conjunctivae and sclerae normal, corneas clear, and pupils equal, round, reactive to light and accomodation Ears: normal TM's and external ear canals both ears Nose: Nares normal. Septum midline. Mucosa normal. No drainage or sinus tenderness. Throat: lips, mucosa, and tongue normal; teeth and gums normal Neck: no adenopathy, supple, symmetrical, trachea midline, and thyroid not enlarged, symmetric, no tenderness/mass/nodules Back: symmetric, no curvature. ROM normal. No CVA tenderness. Lungs: clear to auscultation bilaterally Chest wall: no tenderness Heart: regular rate and rhythm, S1, S2 normal, no murmur, click, rub or gallop Abdomen: soft, non-tender; bowel sounds normal; no masses,  no organomegaly Extremities: extremities normal, atraumatic, no cyanosis or edema Pulses: 2+ and symmetric Skin: Skin color, texture, turgor normal. No rashes or lesions Lymph nodes: Cervical, supraclavicular, and axillary nodes normal. Neurologic: Grossly normal      10/03/2022   10:37 AM 08/06/2022    8:20 AM 07/16/2022   10:06 AM  Depression screen PHQ 2/9  Decreased Interest 0 1 0  Down, Depressed, Hopeless 0 1 0  PHQ - 2 Score 0 2 0  Altered sleeping 0 0 0  Tired, decreased energy 0 1 0  Change in appetite 0 0 0  Feeling bad or failure about yourself  0 1 0  Trouble concentrating 0 0 0  Moving slowly or fidgety/restless 0 0 0  Suicidal thoughts 0 0 0  PHQ-9 Score 0 4 0  Difficult doing work/chores Not difficult at all Not difficult at all Not difficult at all      08/06/2022    8:20 AM 07/16/2022   10:06 AM 01/15/2022   11:45 AM 01/08/2022    2:12 PM  GAD 7 : Generalized Anxiety Score  Nervous,  Anxious, on Edge 0 0 1 1  Control/stop worrying 1 0 1 1  Worry too much - different things 0 0 1 1  Trouble relaxing 1 0 0 1  Restless 0 0 0 0  Easily annoyed  or irritable 1 0 1 1  Afraid - awful might happen 0 0 1 1  Total GAD 7 Score 3 0 5 6  Anxiety Difficulty Not difficult at all  Somewhat difficult Somewhat difficult     Assessment/ Plan: [name] here for annual physical exam.   Annual physical exam  Hyperlipidemia LDL goal <70  Essential hypertension  Benign prostatic hyperplasia, unspecified whether lower urinary tract symptoms present - Plan: PSA, Urinalysis, Routine w reflex microscopic  Thrombocytopenia (HCC)  Family history of bladder cancer - Plan: Urinalysis, Routine w reflex microscopic  Asbestos exposure - Plan: DG Chest 2 View  Xerostomia  Reviewed his labs.  Will gladly reorder PSA since he had some concern about the slight rise in this level.  Plan to collect in 6 months.  Blood pressure is common to rolled.  Cholesterol stable.  Continue Repatha  Continues to have transient thrombocytopenia but not at dangerous levels at this time  Urinalysis without any evidence of blood or any other findings to suggest pathology in the bladder  Plain films obtained given reports of possible asbestos exposure they are not having any symptomology  Discussed sialagogues.  Would strongly consider discussing with pulmonology perhaps an over the mouth mask for CPAP machine.  I question if perhaps he is mouth breathing at night  Counseled on healthy lifestyle choices, including diet (rich in fruits, vegetables and lean meats and low in salt and simple carbohydrates) and exercise (at least 30 minutes of moderate physical activity daily).  Patient to follow up 14m for PSA check  Careli Luzader M. Nadine Counts, DO

## 2023-02-19 ENCOUNTER — Ambulatory Visit: Payer: Medicare Other | Admitting: Cardiology

## 2023-03-08 NOTE — Progress Notes (Signed)
  Cardiology Office Note:   Date:  03/11/2023  ID:  [name], [name] [birth year], MRN [id number] PCP: [name] Norene HERO, DO   [facility] Providers Cardiologist:  Lynwood Schilling, MD {  History of Present Illness:   [name] [name] is a 83 y.o. male who presents for evaluation of CAD/CABG. He had an echo in 2022 for murmur. There was mild aortic sclerosis. Since I last saw him he has done well.  He still golfing when the weather permits but he is not exercising as much as I would like.  He is not going to the gym as much. The patient denies any new symptoms such as chest discomfort, neck or arm discomfort. There has been no new shortness of breath, PND or orthopnea. There have been no reported palpitations, presyncope or syncope.   ROS: As stated in the HPI and negative for all other systems.  Studies Reviewed:    EKG:   EKG Interpretation Date/Time:  Tuesday March 11 2023 10:51:00 EST Ventricular Rate:  70 PR Interval:  284 QRS Duration:  96 QT Interval:  408 QTC Calculation: 440 R Axis:   -15  Text Interpretation: Sinus rhythm with 1st degree A-V block When compared with ECG of 01-Jun-2019 15:49, No significant change since last tracing Confirmed by Schilling Lynwood ([id number]) on 03/11/2023 11:14:53 AM    Risk Assessment/Calculations:          Physical Exam:   VS:  BP 128/80   Pulse 70   Ht 6' 1 (1.854 m)   Wt 223 lb (101.2 kg)   BMI 29.42 kg/m    Wt Readings from Last 3 Encounters:  03/11/23 223 lb (101.2 kg)  02/11/23 223 lb (101.2 kg)  10/03/22 218 lb (98.9 kg)     GEN: Well nourished, well developed in no acute distress NECK: No JVD; No carotid bruits CARDIAC: RRR, 3 out of 6 apical early peaking systolic murmur radiating at aortic outflow tract, no diastolic murmurs, rubs, gallops RESPIRATORY:  Clear to auscultation without rales, wheezing or rhonchi  ABDOMEN: Soft, non-tender, non-distended EXTREMITIES:  No edema; No deformity   ASSESSMENT AND  PLAN:    CAD:   The patient has no new sypmtoms.  No further cardiovascular testing is indicated.  We will continue with aggressive risk reduction and meds as listed.   HTN:  The blood pressure is at target.  No change in therapy.    DYSLIPIDEMIA:   His LDL was 65.  No change in therapy.     AS: He has some very mild aortic stenosis.  I will follow this clinically.  No need for repeat echo at this point.  Her last echo was in 2024.   Follow up with me in 1 year  Signed, Lynwood Schilling, MD

## 2023-03-11 ENCOUNTER — Encounter: Payer: Self-pay | Admitting: Cardiology

## 2023-03-11 ENCOUNTER — Ambulatory Visit: Payer: Medicare Other | Admitting: Cardiology

## 2023-03-11 VITALS — BP 128/80 | HR 70 | Ht 73.0 in | Wt 223.0 lb

## 2023-03-11 DIAGNOSIS — I350 Nonrheumatic aortic (valve) stenosis: Secondary | ICD-10-CM | POA: Diagnosis not present

## 2023-03-11 DIAGNOSIS — I10 Essential (primary) hypertension: Secondary | ICD-10-CM | POA: Diagnosis not present

## 2023-03-11 DIAGNOSIS — I2510 Atherosclerotic heart disease of native coronary artery without angina pectoris: Secondary | ICD-10-CM | POA: Diagnosis not present

## 2023-03-11 DIAGNOSIS — E785 Hyperlipidemia, unspecified: Secondary | ICD-10-CM | POA: Diagnosis not present

## 2023-03-11 MED ORDER — NITROGLYCERIN 0.4 MG SL SUBL: 0.4000 mg | SUBLINGUAL_TABLET | SUBLINGUAL | 99 refills | Status: AC | PRN

## 2023-03-11 NOTE — Patient Instructions (Signed)

## 2023-04-09 NOTE — Progress Notes (Signed)
 HPI male never smoker followed for OSA, complicated by CAD/CABG, HBP, hiatal hernia Unattended Home Sleep Test 05/09/14- AHI 24.9/ hr, desat to 85%, body weight 208 pounds Unattended Home Sleep Test 08/02/15-AHI 11.3/hour, desaturation to 76%, body weight 212.5 pounds -----------------------------------------------------------   04/09/22- 83 year old male never smoker followed for OSA, Insomnia complicated by CAD/CABG, HBP, hiatal hernia, BPH, CPAP auto 5-12/Adapt Download- compliance   97%, AHI 1.6/hr Body weight today- 223 lbs Covid vax- 3 Phizer, 1 Moderna Flu vax-had Download reviewed.  He is doing very well with his CPAP using nasal pillows mask.  Takes occasional melatonin to help sleep.  No concerns. Follows with cardiology for history of CAD-stable.  Continues to play golf.  04/10/23- 83 year old male never smoker followed for OSA, Insomnia complicated by CAD/CABG, HBP, hiatal hernia, BPH, Lymphocytic Colitis, GERD,  CPAP auto 5-12/Adapt Download- compliance   63%, AHI 1.8/hr                 Every night but many short nights Body weight today- 227 lbs -------Wearing CPAP-c/o dry mouth Discussed the use of AI scribe software for clinical note transcription with the patient, who gave verbal consent to proceed.  History of Present Illness   The patient, with a history of sleep apnea managed with CPAP, presents with a new complaint of dry mouth in the mornings. This symptom started a couple of months ago and is not associated with any medication changes. The patient has tried using a humidifier in the bedroom and adjusting the humidifier on the CPAP machine without significant improvement. He has also tried using a Zymelt, a small tab placed in the mouth, which provides some relief but is uncomfortable. The patient has not noticed any changes in the functioning of the CPAP machine.    CXR 02/11/23 IMPRESSION: Negative for acute cardiopulmonary disease  ROS-see HPI     + =  positive Constitutional:   No-   weight loss, night sweats, fevers, chills, fatigue, lassitude. HEENT:   No-  headaches, difficulty swallowing, tooth/dental problems, sore throat,       No-  sneezing, itching, ear ache, nasal congestion, post nasal drip,  CV:  No-   chest pain, orthopnea, PND, swelling in lower extremities, anasarca,                                                     dizziness, palpitations Resp: No-   shortness of breath with exertion or at rest.              No-   productive cough,  No non-productive cough,  No- coughing up of blood.              No-   change in color of mucus.  No- wheezing.   Skin: No-   rash or lesions. GI:  No-   heartburn, indigestion, abdominal pain, nausea, vomiting, diarrhea,                 change in bowel habits, loss of appetite GU: No-   dysuria, change in color of urine, no urgency or frequency.  No- flank pain. MS:  + joint pain or swelling.  No- decreased range of motion.  No- back pain. Neuro-     nothing unusual Psych:  No- change in mood or affect. No depression or anxiety.  No memory loss.  OBJ- Physical Exam   + = positive General- Alert, Oriented, Affect-appropriate, Distress- none acute, tall, not obese Skin- rash-none, lesions- none, excoriation- none Lymphadenopathy- none Head- atraumatic            Eyes- Gross vision intact, PERRLA, conjunctivae and secretions clear            Ears- Hearing, canals-normal            Nose- Clear, no-Septal dev, mucus, polyps, erosion, perforation             Throat- Mallampati II-III , mucosa clear , drainage- none, tonsils- atrophic Neck- flexible , trachea midline, no stridor , thyroid  nl, carotid no bruit Chest - symmetrical excursion , unlabored           Heart/CV- RRR ,  Murmur+ TR , no gallop  , no rub, nl s1 s2                           - JVD- none , edema- none, stasis changes- none, varices- none           Lung- clear to P&A, wheeze- none, cough- none , dullness-none, rub- none            Chest wall-  Abd-  Br/ Gen/ Rectal- Not done, not indicated Extrem- cyanosis- none, clubbing, none, atrophy- none, strength- nl,  Neuro- grossly intact to observation.  Assessment and Plan    Dry Mouth with CPAP use New onset of dry mouth in the morning for the past couple of months. No recent changes in medications or mask style. Humidifier on CPAP machine is set at 81%. Patient has tried using a Zymelt to moisten mouth with some relief, but finds it uncomfortable. -Adjust humidifier settings on CPAP machine to increase humidity. -Consider using Biotene mouthwash before bedtime. -Continue use of Zymelt, possibly placing it in a different location in the mouth for comfort.  CPAP Compliance CPAP machine settings are appropriate and breakthrough apneas are less than two per hour. However, there are several short nights where the machine is not used for the recommended minimum of four hours. -No changes to CPAP settings. -Encourage consistent use of CPAP machine for at least four hours per night.

## 2023-04-10 ENCOUNTER — Encounter: Payer: Self-pay | Admitting: Internal Medicine

## 2023-04-10 ENCOUNTER — Ambulatory Visit: Payer: Medicare Other | Admitting: Internal Medicine

## 2023-04-10 VITALS — BP 132/62 | HR 58 | Temp 98.2°F | Ht 73.0 in | Wt 227.0 lb

## 2023-04-10 DIAGNOSIS — G4733 Obstructive sleep apnea (adult) (pediatric): Secondary | ICD-10-CM | POA: Diagnosis not present

## 2023-04-10 DIAGNOSIS — R682 Dry mouth, unspecified: Secondary | ICD-10-CM | POA: Diagnosis not present

## 2023-04-10 NOTE — Patient Instructions (Signed)
 We can continue CPAP auto 5-12  Fro dry mouth- try Biotene mouth wash at bedtime. Try repositioning the xylitol tab in your mouth. YOu can also try turning up the CPAP humidifier a little if the reservoir isn't already running dry over night.

## 2023-04-17 ENCOUNTER — Encounter: Payer: Self-pay | Admitting: Internal Medicine

## 2023-05-12 ENCOUNTER — Other Ambulatory Visit: Payer: Self-pay | Admitting: Pharmacist Clinician (PhC)/ Clinical Pharmacy Specialist

## 2023-05-12 MED ORDER — REPATHA SURECLICK 140 MG/ML ~~LOC~~ SOAJ: SUBCUTANEOUS | 3 refills | Status: AC

## 2023-05-12 NOTE — Telephone Encounter (Signed)
 Patent needed refill on Repatha.  Asked about grant, which will expire in April.  He will call back then to renew

## 2023-05-13 ENCOUNTER — Other Ambulatory Visit: Payer: Self-pay | Admitting: Family Medicine

## 2023-05-13 DIAGNOSIS — F418 Other specified anxiety disorders: Secondary | ICD-10-CM

## 2023-06-10 DIAGNOSIS — H2513 Age-related nuclear cataract, bilateral: Secondary | ICD-10-CM | POA: Diagnosis not present

## 2023-06-10 DIAGNOSIS — H43811 Vitreous degeneration, right eye: Secondary | ICD-10-CM | POA: Diagnosis not present

## 2023-06-18 ENCOUNTER — Telehealth: Payer: Self-pay | Admitting: Pharmacist Clinician (PhC)/ Clinical Pharmacy Specialist

## 2023-06-18 NOTE — Telephone Encounter (Signed)
 Pt called in asking to speak with Kristin, RPH-CPP about his Repatha patient assistance.

## 2023-06-18 NOTE — Telephone Encounter (Signed)
 Renewed [employer] grant, good to 06/24/23  Will mail updated information to patient.

## 2023-07-14 DIAGNOSIS — R3915 Urgency of urination: Secondary | ICD-10-CM | POA: Diagnosis not present

## 2023-07-21 ENCOUNTER — Other Ambulatory Visit: Payer: Self-pay | Admitting: Cardiology

## 2023-08-15 ENCOUNTER — Other Ambulatory Visit: Payer: Medicare Other

## 2023-08-15 DIAGNOSIS — N400 Enlarged prostate without lower urinary tract symptoms: Secondary | ICD-10-CM

## 2023-08-16 LAB — PSA: Prostate Specific Ag, Serum: 3.4 ng/mL (ref 0.0–4.0)

## 2023-08-18 ENCOUNTER — Ambulatory Visit: Payer: Self-pay | Admitting: Family Medicine

## 2023-08-19 ENCOUNTER — Ambulatory Visit: Payer: Medicare Other | Admitting: Family Medicine

## 2023-08-19 ENCOUNTER — Other Ambulatory Visit (HOSPITAL_BASED_OUTPATIENT_CLINIC_OR_DEPARTMENT_OTHER): Payer: Self-pay

## 2023-08-19 ENCOUNTER — Encounter: Payer: Self-pay | Admitting: Family Medicine

## 2023-08-19 VITALS — BP 151/78 | HR 60 | Temp 98.6°F | Ht 73.0 in | Wt 224.0 lb

## 2023-08-19 DIAGNOSIS — I2510 Atherosclerotic heart disease of native coronary artery without angina pectoris: Secondary | ICD-10-CM | POA: Diagnosis not present

## 2023-08-19 DIAGNOSIS — I10 Essential (primary) hypertension: Secondary | ICD-10-CM | POA: Diagnosis not present

## 2023-08-19 DIAGNOSIS — N400 Enlarged prostate without lower urinary tract symptoms: Secondary | ICD-10-CM | POA: Diagnosis not present

## 2023-08-19 DIAGNOSIS — G4733 Obstructive sleep apnea (adult) (pediatric): Secondary | ICD-10-CM

## 2023-08-19 DIAGNOSIS — R109 Unspecified abdominal pain: Secondary | ICD-10-CM

## 2023-08-19 LAB — URINALYSIS, ROUTINE W REFLEX MICROSCOPIC
Bilirubin, UA: NEGATIVE
Glucose, UA: NEGATIVE
Ketones, UA: NEGATIVE
Leukocytes,UA: NEGATIVE
Nitrite, UA: NEGATIVE
Protein,UA: NEGATIVE
RBC, UA: NEGATIVE
Specific Gravity, UA: 1.02 (ref 1.005–1.030)
Urobilinogen, Ur: 0.2 mg/dL (ref 0.2–1.0)
pH, UA: 6 (ref 5.0–7.5)

## 2023-08-19 NOTE — Progress Notes (Signed)
 Subjective: CC: Follow-up BPH PCP: Eliodoro Guerin, DO WUJ:[id number] [name] [name] is a 83 y.o. male presenting to clinic today for:  1.  BPH Patient had PSA collected and it had risen from 2.7-3.6 so we elected for close follow-up on this.  He had this drawn it was down to 3.4.  He reports no concerning features and is compliant with his Flomax .  2.  Flank pain Patient reports of intermittent right sided flank pain.  He notes that certain movements do seem to aggravate it.  He reports no blood in urine but notes that his father had bladder cancer so he would like to have his urine checked.  3.  Hypertension associate with hyperlipidemia Patient is compliant with all medications.  He reports no chest pain, shortness of breath or change in exercise tolerance.  He did not take his medications this morning however and is fasting just in case he needs labs done   ROS: Per HPI  Allergies  Allergen Reactions   Levofloxacin     Caused C- Diff   Crestor [Rosuvastatin] Other (See Comments)    Liver problems   Vytorin [Ezetimibe-Simvastatin] Other (See Comments)    Liver problems   Past Medical History:  Diagnosis Date   AAA (abdominal aortic aneurysm) (HCC)    gets abd US  every 3 years to watch,last check 08/2018   Arthritis    BPH (benign prostatic hyperplasia)    CAD (coronary artery disease)    Colon polyps    Dyslipidemia    GERD (gastroesophageal reflux disease)    Heart murmur    Herniated disc    Hiatal hernia    History of kidney stones    HTN (hypertension)    Hyperlipidemia    OSA on CPAP    Tinnitus     Current Outpatient Medications:    aspirin EC 81 MG tablet, Take 81 mg by mouth daily. Swallow whole., Disp: , Rfl:    busPIRone  (BUSPAR ) 15 MG tablet, TAKE ONE TABLET BY MOUTH TWICE DAILY, Disp: 180 tablet, Rfl: 1   Cholecalciferol ([employer] VIT D 5000 HIGH-POTENCY PO), Take 1 tablet by mouth daily., Disp: , Rfl:    Coenzyme Q10 50 MG CAPS, 1 tab Orally twice a day,  Disp: , Rfl:    Digestive Enzymes (ENZYME DIGEST PO), Take 1 capsule by mouth at bedtime., Disp: , Rfl:    Evolocumab  (REPATHA  SURECLICK) 140 MG/ML SOAJ, INJECT 1 ML (140MG ) EVERY 2 WEEKS AS DIRECTED, Disp: 6 mL, Rfl: 3   fluticasone  (FLONASE ) 50 MCG/ACT nasal spray, Place 2 sprays into both nostrils daily as needed for allergies or rhinitis., Disp: 9.9 mL, Rfl: 3   mesalamine (LIALDA) 1.2 g EC tablet, Take 4.8 g by mouth every other day., Disp: , Rfl:    metoprolol  tartrate (LOPRESSOR ) 25 MG tablet, TAKE ONE TABLET TWICE DAILY, Disp: 180 tablet, Rfl: 3   Multiple Vitamin (MULTIVITAMINS PO), 1 tab Orally twice a day, Disp: , Rfl:    nitroGLYCERIN  (NITROSTAT ) 0.4 MG SL tablet, Place 1 tablet (0.4 mg total) under the tongue every 5 (five) minutes as needed for chest pain., Disp: 25 tablet, Rfl: prn   omeprazole (PRILOSEC) 20 MG capsule, Take 20 mg by mouth daily., Disp: , Rfl:    Probiotic Product (PROBIOTIC PO), Take 1 capsule by mouth daily., Disp: , Rfl:    tamsulosin  (FLOMAX ) 0.4 MG CAPS capsule, Take 0.4 mg by mouth daily., Disp: , Rfl:    Turmeric (QC TUMERIC COMPLEX PO),  Take by mouth daily., Disp: , Rfl:  Social History   Socioeconomic History   Marital status: Married    Spouse name: Kristeen Peto   Number of children: 2   Years of education: Not on file   Highest education level: Bachelor's degree (e.g., BA, AB, BS)  Occupational History   Occupation: Government social research officer for [employer]  Tobacco Use   Smoking status: Never   Smokeless tobacco: Never   Tobacco comments:    does not smoke   Vaping Use   Vaping status: Never Used  Substance and Sexual Activity   Alcohol use: Yes    Alcohol/week: 1.0 standard drink of alcohol    Types: 1 Glasses of wine per week    Comment: with meal   Drug use: No   Sexual activity: Yes  Other Topics Concern   Not on file  Social History Narrative   Married, 2 adult children and 4 grandchildren. Retired    Writer Strain: Patient Unable To Answer (10/03/2022)   Overall Financial Resource Strain (CARDIA)    Difficulty of Paying Living Expenses: Patient unable to answer  Food Insecurity: No Food Insecurity (10/03/2022)   Hunger Vital Sign    Worried About Running Out of Food in the Last Year: Never true    Ran Out of Food in the Last Year: Never true  Transportation Needs: No Transportation Needs (10/03/2022)   PRAPARE - Administrator, Civil Service (Medical): No    Lack of Transportation (Non-Medical): No  Physical Activity: Insufficiently Active (10/03/2022)   Exercise Vital Sign    Days of Exercise per Week: 3 days    Minutes of Exercise per Session: 30 min  Stress: No Stress Concern Present (10/03/2022)   [employer] of Occupational Health - Occupational Stress Questionnaire    Feeling of Stress : Not at all  Social Connections: Socially Integrated (10/03/2022)   Social Connection and Isolation Panel    Frequency of Communication with Friends and Family: More than three times a week    Frequency of Social Gatherings with Friends and Family: Twice a week    Attends Religious Services: More than 4 times per year    Active Member of [employer] or Organizations: Yes    Attends Engineer, structural: More than 4 times per year    Marital Status: Married  Catering manager Violence: Not At Risk (10/03/2022)   Humiliation, Afraid, Rape, and Kick questionnaire    Fear of Current or Ex-Partner: No    Emotionally Abused: No    Physically Abused: No    Sexually Abused: No   Family History  Problem Relation Age of Onset   Heart disease Father    Cancer Father        Bladder Cancer   Heart attack Father    Colon polyps Father    Hypertension Mother    Alzheimer's disease Mother    Stroke Mother        3-4    Diabetes Brother    Heart disease Brother    Arthritis Brother        bilateral knee replacement, back issues    Pneumonia Sister    GI problems Sister         esophogus stretched    Crohn's disease Son     Objective: Office vital signs reviewed. BP (!) 151/78   Pulse 60   Temp 98.6 F (37 C)   Ht 6'  1 (1.854 m)   Wt 224 lb (101.6 kg)   SpO2 98%   BMI 29.55 kg/m   Physical Examination:  General: Awake, alert, well-appearing elderly male, No acute distress HEENT: Sclera white.  Moist mucous membranes Cardio: regular rate and rhythm, S1S2 heard, no murmurs appreciated Pulm: clear to auscultation bilaterally, no wheezes, rhonchi or rales; normal work of breathing on room air MSK: No palpable deformity in the area of discomfort.  He does have mild tenderness palpation to the CVA  Recent Results (from the past 2160 hours)  PSA     Status: None   Collection Time: 08/15/23  8:38 AM  Result Value Ref Range   Prostate Specific Ag, Serum 3.4 0.0 - 4.0 ng/mL    Comment: Roche ECLIA methodology. According to the American Urological Association, Serum PSA should decrease and remain at undetectable levels after radical prostatectomy. The AUA defines biochemical recurrence as an initial PSA value 0.2 ng/mL or greater followed by a subsequent confirmatory PSA value 0.2 ng/mL or greater. Values obtained with different assay methods or kits cannot be used interchangeably. Results cannot be interpreted as absolute evidence of the presence or absence of malignant disease.    Assessment/ Plan: 83 y.o. male   Benign prostatic hyperplasia, unspecified whether lower urinary tract symptoms present - Plan: PSA  Right flank discomfort - Plan: Urinalysis, Routine w reflex microscopic, CBC  Essential hypertension - Plan: CMP14+EGFR, Lipid panel  Atherosclerosis of native coronary artery of native heart without angina pectoris - Plan: CMP14+EGFR, Lipid panel, TSH  PSA acceptable and stable.  Urinalysis collected which demonstrated no evidence of blood or sediment or infection.  Suspect right flank is MSK in nature.  Stretching recommended.   Use of topical lidocaine  patches as needed.  If persistent issue, we can consider imaging of the spine and/or rib  Blood pressure NOT improved upon recheck, he has not taken meds this am.  I have ordered future labs that he can have done prior to his annual physical in December.  Continue all medication as prescribed by cardiology and myself to prevent progression of CAD  Lakoda Raske Bambi Bonine, DO [facility] [phone]

## 2023-08-20 ENCOUNTER — Other Ambulatory Visit: Payer: Self-pay | Admitting: Family Medicine

## 2023-08-20 ENCOUNTER — Ambulatory Visit: Payer: Self-pay | Admitting: Family Medicine

## 2023-08-20 DIAGNOSIS — R1011 Right upper quadrant pain: Secondary | ICD-10-CM

## 2023-09-02 ENCOUNTER — Ambulatory Visit (HOSPITAL_COMMUNITY)
Admission: RE | Admit: 2023-09-02 | Discharge: 2023-09-02 | Disposition: A | Discharge status: Home / Self Care | Source: Ambulatory Visit | Attending: Family Medicine | Admitting: Family Medicine

## 2023-09-02 DIAGNOSIS — R1011 Right upper quadrant pain: Secondary | ICD-10-CM | POA: Insufficient documentation

## 2023-09-02 DIAGNOSIS — K760 Fatty (change of) liver, not elsewhere classified: Secondary | ICD-10-CM | POA: Diagnosis not present

## 2023-09-03 ENCOUNTER — Telehealth: Payer: Self-pay | Admitting: Family Medicine

## 2023-09-03 ENCOUNTER — Telehealth: Payer: Self-pay

## 2023-09-03 ENCOUNTER — Ambulatory Visit: Payer: Self-pay | Admitting: Family Medicine

## 2023-09-03 DIAGNOSIS — K760 Fatty (change of) liver, not elsewhere classified: Secondary | ICD-10-CM | POA: Insufficient documentation

## 2023-09-03 DIAGNOSIS — N2889 Other specified disorders of kidney and ureter: Secondary | ICD-10-CM | POA: Insufficient documentation

## 2023-09-03 NOTE — Telephone Encounter (Signed)
 Test was ordered.

## 2023-09-03 NOTE — Telephone Encounter (Signed)
 Copied from CRM [id number]. Topic: Clinical - Lab/Test Results >> Sep 03, 2023  2:42 PM Selinda RAMAN wrote: Reason for CRM: The patients called in to get his results from his recent scan. I relayed what the provider stated and he would like to be notified about the test to further evaluate the renal cyst as soon as possible.

## 2023-09-03 NOTE — Telephone Encounter (Unsigned)
 Copied from CRM [id number]. Topic: Referral - Request for Referral >> Sep 03, 2023  2:44 PM Selinda RAMAN wrote: Did the patient discuss referral with their provider in the last year? Yes  The provider mentioned it while commenting on the results of the recent scan stating he has fatty liver disease  Appointment offered? No  Type of order/referral and detailed reason for visit: Referral to GI for Fatty Liver disease management  Preference of office, provider, location: Whatever GI Specialist his provider recommends that is not too far away  If referral order, have you been seen by this specialty before? No (If Yes, this issue or another issue? When? Where?  Can we respond through MyChart? Yes  Please assist patient further

## 2023-09-04 ENCOUNTER — Other Ambulatory Visit: Payer: Self-pay

## 2023-09-04 DIAGNOSIS — K760 Fatty (change of) liver, not elsewhere classified: Secondary | ICD-10-CM

## 2023-09-04 NOTE — Telephone Encounter (Signed)
 Please Advise

## 2023-09-04 NOTE — Telephone Encounter (Signed)
 Referral placed and pt notified. LS

## 2023-09-11 ENCOUNTER — Ambulatory Visit (HOSPITAL_COMMUNITY)
Admission: RE | Admit: 2023-09-11 | Discharge: 2023-09-11 | Disposition: A | Discharge status: Home / Self Care | Source: Ambulatory Visit | Attending: Family Medicine | Admitting: Family Medicine

## 2023-09-11 DIAGNOSIS — N2889 Other specified disorders of kidney and ureter: Secondary | ICD-10-CM | POA: Diagnosis not present

## 2023-09-11 DIAGNOSIS — N281 Cyst of kidney, acquired: Secondary | ICD-10-CM | POA: Diagnosis not present

## 2023-09-11 DIAGNOSIS — N289 Disorder of kidney and ureter, unspecified: Secondary | ICD-10-CM | POA: Diagnosis not present

## 2023-09-11 DIAGNOSIS — I700 Atherosclerosis of aorta: Secondary | ICD-10-CM | POA: Diagnosis not present

## 2023-09-11 MED ORDER — GADOBUTROL 1 MMOL/ML IV SOLN
10.0000 mL | Freq: Once | INTRAVENOUS | Status: AC | PRN
  Administered 2023-09-11: 10 mL via INTRAVENOUS

## 2023-09-15 ENCOUNTER — Ambulatory Visit: Payer: Self-pay | Admitting: Family Medicine

## 2023-09-19 ENCOUNTER — Telehealth: Payer: Self-pay

## 2023-09-19 NOTE — Telephone Encounter (Signed)
 Copied from CRM [id number]. Topic: General - Other >> Sep 19, 2023 12:36 PM Nathanel BROCKS wrote: Reason for CRM: pt called and said that he wanted to tellk Dr Jolinda he was sorry, he's a big joker for one and secondly he has a question about his medication. Please call

## 2023-09-19 NOTE — Telephone Encounter (Signed)
 Was in regards to his MRI and US  results, and hopes that he is still in good standings with you.

## 2023-09-30 DIAGNOSIS — I872 Venous insufficiency (chronic) (peripheral): Secondary | ICD-10-CM | POA: Diagnosis not present

## 2023-09-30 DIAGNOSIS — D225 Melanocytic nevi of trunk: Secondary | ICD-10-CM | POA: Diagnosis not present

## 2023-09-30 DIAGNOSIS — Z1283 Encounter for screening for malignant neoplasm of skin: Secondary | ICD-10-CM | POA: Diagnosis not present

## 2023-09-30 DIAGNOSIS — L570 Actinic keratosis: Secondary | ICD-10-CM | POA: Diagnosis not present

## 2023-09-30 DIAGNOSIS — L821 Other seborrheic keratosis: Secondary | ICD-10-CM | POA: Diagnosis not present

## 2023-09-30 DIAGNOSIS — X32XXXD Exposure to sunlight, subsequent encounter: Secondary | ICD-10-CM | POA: Diagnosis not present

## 2023-09-30 NOTE — Procedures (Signed)
CPAP compliance report

## 2023-10-07 ENCOUNTER — Ambulatory Visit (INDEPENDENT_AMBULATORY_CARE_PROVIDER_SITE_OTHER)

## 2023-10-07 VITALS — BP 151/78 | HR 60 | Ht 73.0 in | Wt 224.0 lb

## 2023-10-07 DIAGNOSIS — Z0000 Encounter for general adult medical examination without abnormal findings: Secondary | ICD-10-CM

## 2023-10-07 NOTE — Patient Instructions (Signed)
 Mr. [name] [name] , Thank you for taking time out of your busy schedule to complete your Annual Wellness Visit with me. I enjoyed our conversation and look forward to speaking with you again next year. I, as well as your care team,  appreciate your ongoing commitment to your health goals. Please review the following plan we discussed and let me know if I can assist you in the future. Your Game plan/ To Do List    Referrals: If you haven't heard from the office you've been referred to, please reach out to them at the phone provided.   Follow up Visits: We will see or speak with you next year for your Next Medicare AWV with our clinical staff on 10/07/24 at 1:20a.m. Have you seen your provider in the last 6 months (3 months if uncontrolled diabetes)? Yes  Clinician Recommendations:  Aim for 30 minutes of exercise or brisk walking, 6-8 glasses of water , and 5 servings of fruits and vegetables each day.       This is a list of the screenings recommended for you:  Health Maintenance  Topic Date Due   COVID-19 Vaccine (6 - Pfizer risk 2024-25 season) 07/01/2023   Medicare Annual Wellness Visit  10/03/2023   Flu Shot  10/03/2023   Colon Cancer Screening  07/13/2030   DTaP/Tdap/Td vaccine (4 - Td or Tdap) 03/22/2032   Pneumococcal Vaccine for age over 43  Completed   Zoster (Shingles) Vaccine  Completed   Hepatitis B Vaccine  Aged Out   HPV Vaccine  Aged Out   Meningitis B Vaccine  Aged Out    Advanced directives: (Copy Requested) Please bring a copy of your health care power of attorney and living will to the office to be added to your chart at your convenience. You can mail to [facility] [address] [place], [place] [address] or email to [email or web address] Advance Care Planning is important because it:  [x]  Makes sure you receive the medical care that is consistent with your values, goals, and preferences  [x]  It provides guidance to your family and loved ones and  reduces their decisional burden about whether or not they are making the right decisions based on your wishes.  Follow the link provided in your after visit summary or read over the paperwork we have mailed to you to help you started getting your Advance Directives in place. If you need assistance in completing these, please reach out to us  so that we can help you!  See attachments for Preventive Care and Fall Prevention Tips.

## 2023-10-07 NOTE — Progress Notes (Signed)
 Subjective:   Rishi Vicario is a 83 y.o. who presents for a Medicare Wellness preventive visit.  As a reminder, Annual Wellness Visits don't include a physical exam, and some assessments may be limited, especially if this visit is performed virtually. We may recommend an in-person follow-up visit with your provider if needed.  Visit Complete: Virtual I connected with  Dallas Caron Lush on 10/07/23 by a audio enabled telemedicine application and verified that I am speaking with the correct person using two identifiers.  Patient Location: Home  Provider Location: Home Office  I discussed the limitations of evaluation and management by telemedicine. The patient expressed understanding and agreed to proceed.  Vital Signs: Because this visit was a virtual/telehealth visit, some criteria may be missing or patient reported. Any vitals not documented were not able to be obtained and vitals that have been documented are patient reported.  VideoDeclined- This patient declined Librarian, academic. Therefore the visit was completed with audio only.  Persons Participating in Visit: Patient.  AWV Questionnaire: No: Patient Medicare AWV questionnaire was not completed prior to this visit.  Cardiac Risk Factors include: advanced age (>15men, >94 women);hypertension;dyslipidemia;male gender     Objective:    Today's Vitals   10/07/23 1345  BP: (!) 151/78  Pulse: 60  Weight: 224 lb (101.6 kg)  Height: 6' 1 (1.854 m)   Body mass index is 29.55 kg/m.     10/07/2023    1:59 PM 10/07/2023    1:53 PM 10/03/2022   10:39 AM 09/21/2021    3:36 PM 09/20/2020    2:14 PM 09/20/2019   11:10 AM 06/01/2019    3:54 PM  Advanced Directives  Does Patient Have a Medical Advance Directive? Yes No Yes Yes Yes Yes Yes  Type of Estate agent of [place];Living will  Healthcare Power of [place];Living will Healthcare Power of [place];Living will Healthcare  Power of [place];Living will Healthcare Power of [place];Living will Living will;Healthcare Power of Attorney  Does patient want to make changes to medical advance directive?   No - Patient declined  No - Patient declined No - Patient declined   Copy of Healthcare Power of Attorney in Chart? Yes - validated most recent copy scanned in chart (See row information)  Yes - validated most recent copy scanned in chart (See row information) Yes - validated most recent copy scanned in chart (See row information) Yes - validated most recent copy scanned in chart (See row information) No - copy requested No - copy requested    Current Medications (verified) Outpatient Encounter Medications as of 10/07/2023  Medication Sig   aspirin EC 81 MG tablet Take 81 mg by mouth daily. Swallow whole.   busPIRone  (BUSPAR ) 15 MG tablet TAKE ONE TABLET BY MOUTH TWICE DAILY   Cholecalciferol ([employer] VIT D 5000 HIGH-POTENCY PO) Take 1 tablet by mouth daily.   Coenzyme Q10 50 MG CAPS 1 tab Orally twice a day   Digestive Enzymes (ENZYME DIGEST PO) Take 1 capsule by mouth at bedtime.   Evolocumab  (REPATHA  SURECLICK) 140 MG/ML SOAJ INJECT 1 ML (140MG ) EVERY 2 WEEKS AS DIRECTED   fluticasone  (FLONASE ) 50 MCG/ACT nasal spray Place 2 sprays into both nostrils daily as needed for allergies or rhinitis.   mesalamine (LIALDA) 1.2 g EC tablet Take 4.8 g by mouth every other day.   metoprolol  tartrate (LOPRESSOR ) 25 MG tablet TAKE ONE TABLET TWICE DAILY   Multiple Vitamin (MULTIVITAMINS PO) 1 tab Orally twice a  day   nitroGLYCERIN  (NITROSTAT ) 0.4 MG SL tablet Place 1 tablet (0.4 mg total) under the tongue every 5 (five) minutes as needed for chest pain.   omeprazole (PRILOSEC) 20 MG capsule Take 20 mg by mouth daily.   Probiotic Product (PROBIOTIC PO) Take 1 capsule by mouth daily.   tamsulosin  (FLOMAX ) 0.4 MG CAPS capsule Take 0.4 mg by mouth daily.   Turmeric (QC TUMERIC COMPLEX PO) Take by mouth daily.   No facility-administered  encounter medications on file as of 10/07/2023.    Allergies (verified) Levofloxacin, Crestor [rosuvastatin], and Vytorin [ezetimibe-simvastatin]   History: Past Medical History:  Diagnosis Date   AAA (abdominal aortic aneurysm) (HCC)    gets abd US  every 3 years to watch,last check 08/2018   Arthritis    BPH (benign prostatic hyperplasia)    CAD (coronary artery disease)    Colon polyps    Dyslipidemia    GERD (gastroesophageal reflux disease)    Heart murmur    Herniated disc    Hiatal hernia    History of kidney stones    HTN (hypertension)    Hyperlipidemia    OSA on CPAP    Tinnitus    Past Surgical History:  Procedure Laterality Date   COLONOSCOPY     CORONARY ARTERY BYPASS GRAFT  2006   LIMA to LAD, SVG to RCA, SVG to circumflex, SVG to diagonal    mole removed  2003   stomach   right knee surgery  2008   TONSILLECTOMY     TOTAL KNEE ARTHROPLASTY Right 04/27/2018   Procedure: TOTAL KNEE ARTHROPLASTY RIGHT;  Surgeon: Melodi Lerner, MD;  Location: [facility];  Service: Orthopedics;  Laterality: Right;    TOTAL KNEE ARTHROPLASTY Left 05/31/2019   Procedure: TOTAL KNEE ARTHROPLASTY;  Surgeon: Melodi Lerner, MD;  Location: [facility];  Service: Orthopedics;  Laterality: Left;    UPPER GI ENDOSCOPY     Family History  Problem Relation Age of Onset   Heart disease Father    Cancer Father        Bladder Cancer   Heart attack Father    Colon polyps Father    Hypertension Mother    Alzheimer's disease Mother    Stroke Mother        3-4    Diabetes Brother    Heart disease Brother    Arthritis Brother        bilateral knee replacement, back issues    Pneumonia Sister    GI problems Sister        esophogus stretched    Crohn's disease Son    Social History   Socioeconomic History   Marital status: Married    Spouse name: Madeline   Number of children: 2   Years of education: Not on file   Highest education level: Bachelor's degree (e.g., BA, AB, BS)   Occupational History   Occupation: Government social research officer for [employer]  Tobacco Use   Smoking status: Never   Smokeless tobacco: Never   Tobacco comments:    does not smoke   Vaping Use   Vaping status: Never Used  Substance and Sexual Activity   Alcohol use: Yes    Alcohol/week: 1.0 standard drink of alcohol    Types: 1 Glasses of wine per week    Comment: with meal   Drug use: No   Sexual activity: Yes  Other Topics Concern   Not on file  Social History Narrative   Married, 2  adult children and 4 grandchildren. Retired    Teacher, early years/pre Strain: Low Risk  (10/07/2023)   Overall Financial Resource Strain (CARDIA)    Difficulty of Paying Living Expenses: Not hard at all  Food Insecurity: No Food Insecurity (10/07/2023)   Hunger Vital Sign    Worried About Running Out of Food in the Last Year: Never true    Ran Out of Food in the Last Year: Never true  Transportation Needs: No Transportation Needs (10/07/2023)   PRAPARE - Administrator, Civil Service (Medical): No    Lack of Transportation (Non-Medical): No  Physical Activity: Sufficiently Active (10/07/2023)   Exercise Vital Sign    Days of Exercise per Week: 3 days    Minutes of Exercise per Session: 60 min  Stress: No Stress Concern Present (10/07/2023)   [employer] of Occupational Health - Occupational Stress Questionnaire    Feeling of Stress: Not at all  Social Connections: Socially Integrated (10/07/2023)   Social Connection and Isolation Panel    Frequency of Communication with Friends and Family: More than three times a week    Frequency of Social Gatherings with Friends and Family: More than three times a week    Attends Religious Services: More than 4 times per year    Active Member of [employer] or Organizations: Yes    Attends Engineer, structural: More than 4 times per year    Marital Status: Married    Tobacco Counseling Counseling given: Yes Tobacco  comments: does not smoke     Clinical Intake:  Pre-visit preparation completed: Yes  Pain : No/denies pain     BMI - recorded: 29.55 Nutritional Status: BMI 25 -29 Overweight Nutritional Risks: None Diabetes: No  Lab Results  Component Value Date   HGBA1C 5.5 02/07/2023   HGBA1C 5.8 (H) 12/03/2021   HGBA1C 5.2% 04/12/2014     How often do you need to have someone help you when you read instructions, pamphlets, or other written materials from your doctor or pharmacy?: 1 - Never  Interpreter Needed?: No  Information entered by :: alia t/cma   Activities of Daily Living     10/07/2023    1:52 PM  In your present state of health, do you have any difficulty performing the following activities:  Hearing? 1  Vision? 0  Difficulty concentrating or making decisions? 0  Walking or climbing stairs? 0  Dressing or bathing? 0  Doing errands, shopping? 0  Preparing Food and eating ? N  Using the Toilet? N  In the past six months, have you accidently leaked urine? Y  Do you have problems with loss of bowel control? N  Managing your Medications? N  Managing your Finances? N  Housekeeping or managing your Housekeeping? N    Patient Care Team: Jolinda Norene HERO, DO as PCP - General (Family Medicine) Lavona Agent, MD as PCP - Cardiology (Cardiology) Roz Anes, MD as Consulting Physician (Ophthalmology) Rosalie Kitchens, MD as Consulting Physician (Gastroenterology) Watt Rush, MD as Attending Physician (Urology) Melodi Lerner, MD as Consulting Physician (Orthopedic Surgery) Livingston Rigg, MD as Consulting Physician (Dermatology) Billee Mliss BIRCH, [facility] (Pharmacist) Octavia Bruckner, MD as Consulting Physician (Ophthalmology) Neysa Reggy BIRCH, MD as Consulting Physician (Pulmonary Disease)  I have updated your Care Teams any recent Medical Services you may have received from other providers in the past year.     Assessment:   This is a routine wellness examination  for  [place].  Hearing/Vision screen Hearing Screening - Comments:: Pt a little bit of hearing dif Vision Screening - Comments:: Pt wear glasses/pt goes Dr. Octavia in [facility], last ov 2025   Goals Addressed             This Visit's Progress    Exercise 3x per week (30 min per time)   On track    Would like to get down to 203# Wants to travel more       Depression Screen     10/07/2023    2:07 PM 08/19/2023    9:33 AM 02/11/2023   10:28 AM 10/03/2022   10:37 AM 08/06/2022    8:20 AM 07/16/2022   10:06 AM 01/15/2022   11:45 AM  PHQ 2/9 Scores  PHQ - 2 Score 0 0 0 0 2 0 1  PHQ- 9 Score 0 0 0 0 4 0 4    Fall Risk     10/07/2023    1:47 PM 08/19/2023    9:33 AM 04/10/2023   10:35 AM 02/11/2023   10:28 AM 10/03/2022   10:35 AM  Fall Risk   Falls in the past year? 0 0 0 0 0  Number falls in past yr: 0 0  0 0  Injury with Fall? 0 0  0 0  Risk for fall due to : No Fall Risks No Fall Risks  No Fall Risks No Fall Risks  Follow up Falls evaluation completed Falls evaluation completed   Falls prevention discussed    MEDICARE RISK AT HOME:  Medicare Risk at Home Any stairs in or around the home?: Yes If so, are there any without handrails?: Yes Home free of loose throw rugs in walkways, pet beds, electrical cords, etc?: Yes Adequate lighting in your home to reduce risk of falls?: Yes Life alert?: No Use of a cane, walker or w/c?: No Grab bars in the bathroom?: Yes Shower chair or bench in shower?: Yes Elevated toilet seat or a handicapped toilet?: Yes  TIMED UP AND GO:  Was the test performed?  no  Cognitive Function: 6CIT completed    09/02/2017   11:04 AM 07/25/2016   10:54 AM  MMSE - Mini Mental State Exam  Orientation to time 5 5   Orientation to Place 5 5   Registration 3 3   Attention/ Calculation 5 5   Recall 2 3   Language- name 2 objects 2 2   Language- repeat 1 1  Language- follow 3 step command 3 3   Language- read & follow direction 1 1   Write a  sentence 1 1   Copy design 1 1   Total score 29 30      Data saved with a previous flowsheet row definition        10/07/2023    2:07 PM 10/03/2022   10:39 AM 09/21/2021    3:37 PM 09/20/2020    2:22 PM 09/20/2019   11:05 AM  6CIT Screen  What Year? 0 points 0 points 0 points 0 points 0 points  What month? 0 points 0 points 0 points 0 points 0 points  What time? 0 points 0 points 0 points 0 points 0 points  Count back from 20 0 points 0 points 0 points 0 points 0 points  Months in reverse 0 points 0 points 0 points 0 points 0 points  Repeat phrase 0 points 0 points 0 points 0 points 0 points  Total Score 0  points 0 points 0 points 0 points 0 points    Immunizations Immunization History  Administered Date(s) Administered   Fluad Quad(high Dose 65+) 12/02/2018, 12/21/2019, 12/11/2020, 12/03/2021   Fluad Trivalent(High Dose 65+) 12/09/2022   Influenza, High Dose Seasonal PF 12/21/2015, 12/05/2016, 12/23/2017   Influenza,inj,Quad PF,6+ Mos 12/03/2012, 12/30/2013, 01/03/2015   Influenza,inj,quad, With Preservative 12/02/2016   Moderna Covid-19 Fall Seasonal Vaccine 39yrs & older 05/08/2022, 12/31/2022   Moderna SARS-COV2 Booster Vaccination 09/12/2020   PFIZER(Purple Top)SARS-COV-2 Vaccination 03/23/2019, 04/15/2019, 01/05/2020   Pneumococcal Conjugate-13 01/12/2014   Pneumococcal Polysaccharide-23 10/01/2012   Respiratory Syncytial Virus Vaccine,Recomb Aduvanted(Arexvy) 12/09/2022   Td 03/22/2022   Tdap 07/03/2010, 01/17/2011   Zoster Recombinant(Shingrix ) 07/25/2016, 01/09/2017    Screening Tests Health Maintenance  Topic Date Due   COVID-19 Vaccine (6 - Pfizer risk 2024-25 season) 07/01/2023   INFLUENZA VACCINE  10/03/2023   Medicare Annual Wellness (AWV)  10/06/2024   Colonoscopy  07/13/2030   DTaP/Tdap/Td (4 - Td or Tdap) 03/22/2032   Pneumococcal Vaccine: 50+ Years  Completed   Zoster Vaccines- Shingrix   Completed   Hepatitis B Vaccines  Aged Out   HPV VACCINES  Aged  Out   Meningococcal B Vaccine  Aged Out    Health Maintenance  Health Maintenance Due  Topic Date Due   COVID-19 Vaccine (6 - Pfizer risk 2024-25 season) 07/01/2023   INFLUENZA VACCINE  10/03/2023   Health Maintenance Items Addressed: See Nurse Notes at the end of this note  Additional Screening:  Vision Screening: Recommended annual ophthalmology exams for early detection of glaucoma and other disorders of the eye. Would you like a referral to an eye doctor? No    Dental Screening: Recommended annual dental exams for proper oral hygiene  Community Resource Referral / Chronic Care Management: CRR required this visit?  No   CCM required this visit?  No   Plan:    I have personally reviewed and noted the following in the patient's chart:   Medical and social history Use of alcohol, tobacco or illicit drugs  Current medications and supplements including opioid prescriptions. Patient is not currently taking opioid prescriptions. Functional ability and status Nutritional status Physical activity Advanced directives List of other physicians Hospitalizations, surgeries, and ER visits in previous 12 months Vitals Screenings to include cognitive, depression, and falls Referrals and appointments  In addition, I have reviewed and discussed with patient certain preventive protocols, quality metrics, and best practice recommendations. A written personalized care plan for preventive services as well as general preventive health recommendations were provided to patient.   Ozie Ned, CMA   10/07/2023   After Visit Summary: (MyChart) Due to this being a telephonic visit, the after visit summary with patients personalized plan was offered to patient via MyChart   Notes: Nothing significant to report at this time.

## 2023-10-21 ENCOUNTER — Telehealth: Payer: Self-pay | Admitting: Cardiology

## 2023-10-21 NOTE — Telephone Encounter (Signed)
 Patient is calling to speak to you about some grants for medication. Please advise

## 2023-10-21 NOTE — Telephone Encounter (Signed)
 Patient had questions about grant availability for Entresto for a friend.  Friend is also patient of Dr. Lavona.  Explained that I would need to talk to the patient and if she calls, I would be happy to set her up with grant.

## 2023-10-30 ENCOUNTER — Other Ambulatory Visit: Payer: Self-pay | Admitting: Family Medicine

## 2023-10-30 DIAGNOSIS — F418 Other specified anxiety disorders: Secondary | ICD-10-CM

## 2023-11-24 ENCOUNTER — Ambulatory Visit: Payer: Self-pay

## 2023-11-24 NOTE — Telephone Encounter (Signed)
Fyi noted.

## 2023-11-24 NOTE — Telephone Encounter (Signed)
 FYI Only or Action Required?: FYI only for provider.  Patient was last seen in primary care on 08/19/2023 by Jolinda Norene HERO, DO.  Called Nurse Triage reporting Hand Injury.  Symptoms began several days ago.  Interventions attempted: Other: cleaned, abx ointment, bandaid.  Symptoms are: stable.  Triage Disposition: Home Care  Patient/caregiver understands and will follow disposition?: Yes      Reason for Disposition  Small cut (scratch) or abrasion (scrape) is also present  Answer Assessment - Initial Assessment Questions 1. MECHANISM: How did the injury happen?     Injured hand with gardening clippers 2. ONSET: When did the injury happen? (e.g., minutes, hours ago)      Over the weekend 3. APPEARANCE of INJURY: What does the injury look like?      Cut palm 4. SEVERITY: Can you use your hand normally? Can you bend your fingers into a ball and then fully open them?     Yes, it just feels funny  5. SIZE: For cuts, bruises, or swelling, ask: How large is it? (e.g., inches or centimeters; entire hand)      1/4 in 6. PAIN: How bad is the pain? (Scale 0-10; or none, mild, moderate, severe)     It just feels funny Denies numbness/tingling 7. TETANUS: For any breaks in the skin, ask: When was your last tetanus booster?     unknown 8. OTHER SYMPTOMS: Do you have any other symptoms?      N/a 9. PREGNANCY: Is there any chance you are pregnant? When was your last menstrual period?     N/a    Pt calling to confirm recent Tdap vaccine. Triager relayed last Tdap was 03/2022. Patient verbalized understanding and to call back with worsening symptoms.  Protocols used: Hand Injury-A-AH

## 2023-11-27 DIAGNOSIS — K52832 Lymphocytic colitis: Secondary | ICD-10-CM | POA: Diagnosis not present

## 2023-11-27 DIAGNOSIS — R9389 Abnormal findings on diagnostic imaging of other specified body structures: Secondary | ICD-10-CM | POA: Diagnosis not present

## 2023-12-01 ENCOUNTER — Telehealth: Payer: Self-pay | Admitting: Family Medicine

## 2023-12-01 NOTE — Telephone Encounter (Signed)
 I called and spoke with patient about this and he is going to call [facility] and request that they send us  his most recent lab results so that PCP can be aware and we can have a copy of results on file.

## 2023-12-01 NOTE — Telephone Encounter (Signed)
 Copied from CRM [id number]. Topic: Clinical - Lab/Test Results >> Dec 01, 2023  2:21 PM Tiffany B wrote: Reason for CRM: Patient states PCP referred him to Dr. [facility]. Patient recently had lab results done on Thursday, 11/27/2023 and results came back good. Patient would like PCP to be aware of lab results from specialist and would like lab results to reflect in chart and MyChart. Patient would like to be notified when completed.

## 2023-12-15 ENCOUNTER — Encounter: Payer: Self-pay | Admitting: Family Medicine

## 2024-01-07 ENCOUNTER — Telehealth: Payer: Self-pay | Admitting: *Deleted

## 2024-01-07 NOTE — Telephone Encounter (Signed)
 Copied from CRM [id number]. Topic: Clinical - Order For Equipment >> Jan 06, 2024 11:41 AM Rilla NOVAK wrote: Reason for CRM: Patient calling to request new supplies from [facility].  Please expedite order. [facility] [employer] have already sent a fax over for the order. Please call patient @ [phone].  I checked Dr Saundra pod and up front and nothing has been received on this pt.  Sending msg to [employer] to send CMN

## 2024-02-19 ENCOUNTER — Telehealth: Payer: Self-pay

## 2024-02-19 NOTE — Telephone Encounter (Signed)
 I am not familiar with the devise he is referring to.

## 2024-02-19 NOTE — Telephone Encounter (Signed)
 We don't know what this device is. It might be prudent to wait and bring the add in when he comes for his next appointment.

## 2024-02-19 NOTE — Telephone Encounter (Signed)
 Copied from CRM #[id number]. Topic: Clinical - Medical Advice >> Feb 19, 2024 11:47 AM Lavanda D wrote: Reason for CRM: Patient has a TOC scheduled with Dr. Olena, he wonders if either him or Dr. Neysa knows anything about a little black circular chin device that sticks to your chin and sends impulses to stop sleep apnea. He wants to buy one, he's seen ads on his phone but thinks it may be too good to be true. Would like a call back since he wants to buy one before his appt.  Please advise?

## 2024-02-20 NOTE — Telephone Encounter (Signed)
 Called patient- informed him to bring a picture and name of device to apt scheduled 03/19/2024. Patient verbalized understanding and no concern. Nfn

## 2024-03-05 ENCOUNTER — Ambulatory Visit: Admitting: Family Medicine

## 2024-03-09 ENCOUNTER — Encounter: Payer: Self-pay | Admitting: Internal Medicine

## 2024-03-12 ENCOUNTER — Encounter: Admitting: Family Medicine

## 2024-03-14 NOTE — Progress Notes (Unsigned)
" °  Cardiology Office Note:   Date:  03/16/2024  ID:  [name] [name], DOB [birth year], MRN [id number] PCP: Jolinda Norene HERO, DO  [facility] Providers Cardiologist:  Lynwood Schilling, MD {  History of Present Illness:   [name] [name] is a 84 y.o. male who presents for evaluation of CAD/CABG. He had an echo in 2022 for murmur. There was mild aortic sclerosis. Since I last saw him he has had some dysuria.  He is asking for a UA.  The patient denies any new symptoms such as chest discomfort, neck or arm discomfort. There has been no new shortness of breath, PND or orthopnea. There have been no reported palpitations, presyncope or syncope.  He golfs three times per week.  He has not been going to the gym since COVID.  He does his chores of daily living.The patient denies any new symptoms such as chest discomfort, neck or arm discomfort. There has been no new shortness of breath, PND or orthopnea. There have been no reported palpitations, presyncope or syncope.   He does get some discomfort in his hands in the morning when he wakes up.  This goes away throughout the day.  They are stiff.  It is all of his fingers.  ROS: As stated in the HPI and negative for all other systems.  Studies Reviewed:    EKG:     Normal sinus rhythm, rate 70, axis within normal limits, intervals within normal limits, no acute ST-T wave changes.  03/11/2023  Risk Assessment/Calculations:             Physical Exam:   VS:  BP (!) 165/80 (BP Location: Left Arm, Patient Position: Sitting, Cuff Size: Normal)   Pulse 67   Ht 6' 1 (1.854 m)   Wt 224 lb (101.6 kg)   SpO2 97%   BMI 29.55 kg/m    Wt Readings from Last 3 Encounters:  03/16/24 224 lb (101.6 kg)  03/16/24 224 lb 4 oz (101.7 kg)  10/07/23 224 lb (101.6 kg)     GEN: Well nourished, well developed in no acute distress NECK: No JVD; No carotid bruits CARDIAC: RRR, 3 out of 6 apical systolic murmur radiating at the aortic outflow tract and mid  peaking, no diastolic murmurs, rubs, gallops RESPIRATORY:  Clear to auscultation without rales, wheezing or rhonchi  ABDOMEN: Soft, non-tender, non-distended EXTREMITIES:  No edema; No deformity   ASSESSMENT AND PLAN:   CAD:   The patient has had no new symptoms.  He is continuing with risk reduction.  No change in therapy.   HTN:  The blood pressure is elevated but he says this is very unusual.  He is going to keep a blood pressure diary and drop it off at my office in [place].  DYSLIPIDEMIA:   His LDL was 61 with an HDL of 52.  No change in therapy.  AS: He has some very mild aortic stenosis in 2023.  I am going to follow-up with a repeat echo.  SLEEP APNEA: He has this manage.  No change in therapy.     Follow up with me in 1 year.  Signed, Lynwood Schilling, MD   "

## 2024-03-15 ENCOUNTER — Telehealth: Payer: Self-pay | Admitting: Family Medicine

## 2024-03-15 ENCOUNTER — Other Ambulatory Visit

## 2024-03-15 DIAGNOSIS — N400 Enlarged prostate without lower urinary tract symptoms: Secondary | ICD-10-CM

## 2024-03-15 DIAGNOSIS — R10A1 Flank pain, right side: Secondary | ICD-10-CM

## 2024-03-15 DIAGNOSIS — I10 Essential (primary) hypertension: Secondary | ICD-10-CM

## 2024-03-15 DIAGNOSIS — I2510 Atherosclerotic heart disease of native coronary artery without angina pectoris: Secondary | ICD-10-CM

## 2024-03-15 LAB — LIPID PANEL

## 2024-03-15 NOTE — Telephone Encounter (Signed)
 This has been completed 03/15/2024

## 2024-03-15 NOTE — Telephone Encounter (Signed)
 Copied from CRM #[id number]. Topic: Clinical - Request for Lab/Test Order >> Mar 15, 2024  8:25 AM Marda MATSU wrote: Reason for CRM:  Patient would like a call back, he would like to have an A1c test done today. If possible.  Along with any other blood test.  Patient states he has not eaten or drank anything this morning.

## 2024-03-16 ENCOUNTER — Encounter: Payer: Self-pay | Admitting: Cardiology

## 2024-03-16 ENCOUNTER — Telehealth: Payer: Self-pay | Admitting: Family Medicine

## 2024-03-16 ENCOUNTER — Ambulatory Visit: Payer: Self-pay | Admitting: Family Medicine

## 2024-03-16 ENCOUNTER — Encounter: Payer: Self-pay | Admitting: Family Medicine

## 2024-03-16 ENCOUNTER — Ambulatory Visit (INDEPENDENT_AMBULATORY_CARE_PROVIDER_SITE_OTHER): Payer: Self-pay | Admitting: Family Medicine

## 2024-03-16 ENCOUNTER — Ambulatory Visit: Attending: Cardiology | Admitting: Cardiology

## 2024-03-16 VITALS — BP 165/80 | HR 67 | Ht 73.0 in | Wt 224.0 lb

## 2024-03-16 VITALS — BP 121/75 | HR 73 | Temp 98.0°F | Ht 73.0 in | Wt 224.2 lb

## 2024-03-16 DIAGNOSIS — N400 Enlarged prostate without lower urinary tract symptoms: Secondary | ICD-10-CM

## 2024-03-16 DIAGNOSIS — R739 Hyperglycemia, unspecified: Secondary | ICD-10-CM | POA: Diagnosis not present

## 2024-03-16 DIAGNOSIS — Z8052 Family history of malignant neoplasm of bladder: Secondary | ICD-10-CM | POA: Diagnosis not present

## 2024-03-16 DIAGNOSIS — I10 Essential (primary) hypertension: Secondary | ICD-10-CM | POA: Diagnosis not present

## 2024-03-16 DIAGNOSIS — E785 Hyperlipidemia, unspecified: Secondary | ICD-10-CM | POA: Diagnosis not present

## 2024-03-16 DIAGNOSIS — Z0000 Encounter for general adult medical examination without abnormal findings: Secondary | ICD-10-CM

## 2024-03-16 DIAGNOSIS — D696 Thrombocytopenia, unspecified: Secondary | ICD-10-CM

## 2024-03-16 DIAGNOSIS — I2510 Atherosclerotic heart disease of native coronary artery without angina pectoris: Secondary | ICD-10-CM

## 2024-03-16 DIAGNOSIS — I350 Nonrheumatic aortic (valve) stenosis: Secondary | ICD-10-CM | POA: Diagnosis not present

## 2024-03-16 DIAGNOSIS — R300 Dysuria: Secondary | ICD-10-CM

## 2024-03-16 LAB — TSH: TSH: 2.11 u[IU]/mL (ref 0.450–4.500)

## 2024-03-16 LAB — CMP14+EGFR
ALT: 22 IU/L (ref 0–44)
AST: 28 IU/L (ref 0–40)
Albumin: 4.5 g/dL (ref 3.7–4.7)
Alkaline Phosphatase: 76 IU/L (ref 48–129)
BUN/Creatinine Ratio: 23 (ref 10–24)
BUN: 23 mg/dL (ref 8–27)
Bilirubin Total: 0.9 mg/dL (ref 0.0–1.2)
CO2: 24 mmol/L (ref 20–29)
Calcium: 9.5 mg/dL (ref 8.6–10.2)
Chloride: 106 mmol/L (ref 96–106)
Creatinine, Ser: 1.01 mg/dL (ref 0.76–1.27)
Globulin, Total: 2 g/dL (ref 1.5–4.5)
Glucose: 96 mg/dL (ref 70–99)
Potassium: 4.6 mmol/L (ref 3.5–5.2)
Sodium: 143 mmol/L (ref 134–144)
Total Protein: 6.5 g/dL (ref 6.0–8.5)
eGFR: 75 mL/min/1.73

## 2024-03-16 LAB — URINALYSIS
Bilirubin, UA: NEGATIVE
Glucose, UA: NEGATIVE
Ketones, UA: NEGATIVE
Leukocytes,UA: NEGATIVE
Nitrite, UA: NEGATIVE
Protein,UA: NEGATIVE
RBC, UA: NEGATIVE
Specific Gravity, UA: 1.029 (ref 1.005–1.030)
Urobilinogen, Ur: 0.2 mg/dL (ref 0.2–1.0)
pH, UA: 5 (ref 5.0–7.5)

## 2024-03-16 LAB — CBC
Hematocrit: 44.2 % (ref 37.5–51.0)
Hemoglobin: 15 g/dL (ref 13.0–17.7)
MCH: 32.7 pg (ref 26.6–33.0)
MCHC: 33.9 g/dL (ref 31.5–35.7)
MCV: 96 fL (ref 79–97)
Platelets: 147 x10E3/uL — ABNORMAL LOW (ref 150–450)
RBC: 4.59 x10E6/uL (ref 4.14–5.80)
RDW: 12.6 % (ref 11.6–15.4)
WBC: 6.3 x10E3/uL (ref 3.4–10.8)

## 2024-03-16 LAB — LIPID PANEL
Cholesterol, Total: 128 mg/dL (ref 100–199)
HDL: 52 mg/dL
LDL CALC COMMENT:: 2.5 ratio (ref 0.0–5.0)
LDL Chol Calc (NIH): 61 mg/dL (ref 0–99)
Triglycerides: 72 mg/dL (ref 0–149)
VLDL Cholesterol Cal: 15 mg/dL (ref 5–40)

## 2024-03-16 LAB — BAYER DCA HB A1C WAIVED: HB A1C (BAYER DCA - WAIVED): 5.6 % (ref 4.8–5.6)

## 2024-03-16 LAB — PSA: Prostate Specific Ag, Serum: 3.8 ng/mL (ref 0.0–4.0)

## 2024-03-16 NOTE — Patient Instructions (Signed)
 Preventing Type 2 Diabetes Mellitus  Type 2 diabetes, also called type 2 diabetes mellitus, is a long-term (chronic) disease that affects sugar (glucose) levels in your blood. Normally, a hormone called insulin allows glucose to enter cells in your body. The cells use glucose for energy. With type 2 diabetes, you will have one or both of these problems:  Your pancreas does not make enough insulin.  Cells in your body do not respond properly to insulin that your body makes (insulin resistance).  Insulin resistance or lack of insulin causes extra glucose to build up in the blood instead of going into cells. As a result, high blood glucose (hyperglycemia) develops. That can cause many complications. Being overweight or obese and having an inactive (sedentary) lifestyle can increase your risk for diabetes. Type 2 diabetes can be delayed or prevented by making certain nutrition and lifestyle changes.  How can this condition affect me?  If you do not take steps to prevent diabetes, your blood glucose levels may keep increasing over time. Too much glucose in your blood for a long time can damage your blood vessels, heart, kidneys, nerves, and eyes.  Type 2 diabetes can lead to chronic health problems and complications, such as:  Heart disease.  Stroke.  Blindness.  Kidney disease.  Depression.  Poor circulation in your feet and legs. In severe cases, a foot or leg may need to be surgically removed (amputated).  What can increase my risk?  You may be more likely to develop type 2 diabetes if you:  Have type 2 diabetes in your family.  Are overweight or obese.  Have a sedentary lifestyle.  Have insulin resistance or a history of prediabetes.  Have a history of pregnancy-related (gestational) diabetes or polycystic ovary syndrome (PCOS).  What actions can I take to prevent this?  It can be difficult to recognize signs of type 2 diabetes. Taking action to prevent the disease before you develop symptoms is the best way to avoid  possible damage to your body. Making certain nutrition and lifestyle changes may prevent or delay the disease and related health problems.  Nutrition    Eat healthy meals and snacks regularly. Do not skip meals. Fruit or a handful of nuts is a healthy snack between meals.  Drink water throughout the day. Avoid drinks that contain added sugar, such as soda or sweetened tea. Drink enough fluid to keep your urine pale yellow.  Follow instructions from your health care provider about eating or drinking restrictions.  Limit the amount of food you eat by:  Managing how much you eat at a time (portion size).  Checking food labels for the serving sizes of food.  Using a kitchen scale to weigh amounts of food.  Saut or steam food instead of frying it. Cook with water or broth instead of oils or butter.  Limit saturated fat and salt (sodium) in your diet. Have no more than 1 tsp (2,400 mg) of sodium a day. If you have heart disease or high blood pressure, use less than ? tsp (1,500 mg) of sodium a day.  Lifestyle    Lose weight if needed and as told. Your health care provider can determine how much weight loss is best for you and can help you lose weight safely.  If you are overweight or obese, you may be told to lose at least 5?7% of your body weight.  Manage blood pressure, cholesterol, and stress. Your health care provider will help determine the best treatment  for you.  Do not use any products that contain nicotine or tobacco. These products include cigarettes, chewing tobacco, and vaping devices, such as e-cigarettes. If you need help quitting, ask your health care provider.  Activity    Do physical activity that makes your heart beat faster and makes you sweat (moderate intensity). Do this for at least 30 minutes on at least 5 days of the week, or as much as told by your health care provider.  Ask your health care provider what activities are safe for you. A mix of activities may be best, such as walking, swimming,  cycling, and strength training.  Try to add physical activity into your day. For example:  Park your car farther away than usual so that you walk more.  Take a walk during your lunch break.  Use stairs instead of elevators or escalators.  Walk or bike to work instead of driving.  Alcohol use  If you drink alcohol:  Limit how much you have to:  0?1 drink a day for women who are not pregnant.  0?2 drinks a day for men.  Know how much alcohol is in your drink. In the [place] one drink equals one 12 oz bottle of beer (355 mL), one 5 oz glass of wine (148 mL), or one 1 oz glass of hard liquor (44 mL).  General information  Talk with your health care provider about your risk factors and how you can reduce your risk for diabetes.  Have your blood glucose tested regularly, as told by your health care provider.  Get screening tests as told by your health care provider. You may have these regularly, especially if you have certain risk factors for type 2 diabetes.  Make an appointment with a registered dietitian. This diet and nutrition specialist can help you make a healthy eating plan and help you understand portion sizes and food labels.  Where to find support  Ask your health care provider to recommend a registered dietitian, a certified diabetes care and education specialist, or a weight loss program.  Look for local or online weight loss groups.  Join a gym, fitness club, or outdoor activity group, such as a walking club.  Where to find more information  For help and guidance and to learn more about diabetes and diabetes prevention, visit:  American Diabetes Association (ADA): www.diabetes.[employer] of Diabetes and Digestive and Kidney Diseases: CarFlippers.tn  To learn more about healthy eating, visit:  U.S. Department of Agriculture Architect): https://ball-collins.biz/  Office of Disease Prevention and Health Promotion (ODPHP): ResearchName.uy  Summary  You can delay or prevent type 2 diabetes by eating healthy  foods, losing weight if needed, and increasing your physical activity.  Talk with your health care provider about your risk factors for type 2 diabetes and how you can reduce your risk.  It can be difficult to recognize the signs of type 2 diabetes. The best way to avoid possible damage to your body is to take action to prevent the disease before you develop symptoms.  Get screening tests as told by your health care provider.  This information is not intended to replace advice given to you by your health care provider. Make sure you discuss any questions you have with your health care provider.  Document Revised: 05/15/2020 Document Reviewed: 05/15/2020  Elsevier Patient Education  2024 [employer].

## 2024-03-16 NOTE — Progress Notes (Signed)
 "  [name] [name] is a 84 y.o. male presents to office today for annual physical exam examination.    Patient reports that he has been doing relatively well since her last visit.  His only concern today is that when he donated blood recently he was called back and told that he had an elevation in his A1c.  He is not sure what that number was but he would really like to have that checked today.  He is compliant with all medications.  He denies any chest pain, shortness of breath, presyncope, dizziness etc.  No falls.  He continues to golf frequently and stay physically active.  He does consume desserts daily but typically at small like a cookie   Occupation: Retired, Marital status: Married, Substance use: None There are no preventive care reminders to display for this patient.  Immunization History  Administered Date(s) Administered   Fluad Quad(high Dose 65+) 12/02/2018, 12/21/2019, 12/11/2020, 12/03/2021   Fluad Trivalent(High Dose 65+) 12/09/2022   INFLUENZA, HIGH DOSE SEASONAL PF 12/21/2015, 12/05/2016, 12/23/2017   Influenza,inj,Quad PF,6+ Mos 12/03/2012, 12/30/2013, 01/03/2015   Influenza,inj,quad, With Preservative 12/02/2016   Influenza-Unspecified 12/01/2023   Moderna Covid-19 Fall Seasonal Vaccine 77yrs & older 05/08/2022, 12/31/2022, 12/01/2023   Moderna SARS-COV2 Booster Vaccination 09/12/2020   PFIZER(Purple Top)SARS-COV-2 Vaccination 03/23/2019, 04/15/2019, 01/05/2020   PNEUMOCOCCAL CONJUGATE-20 01/01/2024   Pneumococcal Conjugate-13 01/12/2014   Pneumococcal Polysaccharide-23 10/01/2012   Respiratory Syncytial Virus Vaccine,Recomb Aduvanted(Arexvy) 12/09/2022   Td 03/22/2022   Tdap 07/03/2010, 01/17/2011   Zoster Recombinant(Shingrix ) 07/25/2016, 01/09/2017   Past Medical History:  Diagnosis Date   AAA (abdominal aortic aneurysm)    gets abd US  every 3 years to watch,last check 08/2018   Arthritis    BPH (benign prostatic hyperplasia)    CAD (coronary artery  disease)    Colon polyps    Dyslipidemia    GERD (gastroesophageal reflux disease)    Heart murmur    Herniated disc    Hiatal hernia    History of kidney stones    HTN (hypertension)    Hyperlipidemia    OSA on CPAP    Tinnitus    Social History   Socioeconomic History   Marital status: Married    Spouse name: Madeline   Number of children: 2   Years of education: Not on file   Highest education level: Bachelor's degree (e.g., BA, AB, BS)  Occupational History   Occupation: government social research officer for [employer]  Tobacco Use   Smoking status: Never   Smokeless tobacco: Never   Tobacco comments:    does not smoke   Vaping Use   Vaping status: Never Used  Substance and Sexual Activity   Alcohol use: Yes    Alcohol/week: 1.0 standard drink of alcohol    Types: 1 Glasses of wine per week    Comment: with meal   Drug use: No   Sexual activity: Yes  Other Topics Concern   Not on file  Social History Narrative   Married, 2 adult children and 4 grandchildren. Retired    Chief Executive Officer [employer]   Tobacco Use: Low Risk (10/07/2023)   Patient History    Smoking Tobacco Use: Never    Smokeless Tobacco Use: Never    Passive Exposure: Not on file  Financial Resource Strain: Low Risk (10/07/2023)   Overall Financial Resource Strain (CARDIA)    Difficulty of Paying Living Expenses: Not hard at all  Food Insecurity: No Food Insecurity (10/07/2023)   Epic  Worried About Programme Researcher, Broadcasting/film/video in the Last Year: Never true    Ran Out of Food in the Last Year: Never true  Transportation Needs: No Transportation Needs (10/07/2023)   Epic    Lack of Transportation (Medical): No    Lack of Transportation (Non-Medical): No  Physical Activity: Sufficiently Active (10/07/2023)   Exercise Vital Sign    Days of Exercise per Week: 3 days    Minutes of Exercise per Session: 60 min  Stress: No Stress Concern Present (10/07/2023)   [employer] of Occupational Health - Occupational Stress  Questionnaire    Feeling of Stress: Not at all  Social Connections: Socially Integrated (10/07/2023)   Social Connection and Isolation Panel    Frequency of Communication with Friends and Family: More than three times a week    Frequency of Social Gatherings with Friends and Family: More than three times a week    Attends Religious Services: More than 4 times per year    Active Member of Clubs or Organizations: Yes    Attends Banker Meetings: More than 4 times per year    Marital Status: Married  Catering Manager Violence: Not At Risk (10/07/2023)   Epic    Fear of Current or Ex-Partner: No    Emotionally Abused: No    Physically Abused: No    Sexually Abused: No  Depression (PHQ2-9): Low Risk (10/07/2023)   Depression (PHQ2-9)    PHQ-2 Score: 0  Alcohol Screen: Low Risk (10/07/2023)   Alcohol Screen    Last Alcohol Screening Score (AUDIT): 0  Housing: Unknown (10/07/2023)   Epic    Unable to Pay for Housing in the Last Year: No    Number of Times Moved in the Last Year: Not on file    Homeless in the Last Year: No  Utilities: Not At Risk (10/07/2023)   Epic    Threatened with loss of utilities: No  Health Literacy: Adequate Health Literacy (10/07/2023)   B1300 Health Literacy    Frequency of need for help with medical instructions: Never   Past Surgical History:  Procedure Laterality Date   COLONOSCOPY     CORONARY ARTERY BYPASS GRAFT  2006   LIMA to LAD, SVG to RCA, SVG to circumflex, SVG to diagonal    mole removed  2003   stomach   right knee surgery  2008   TONSILLECTOMY     TOTAL KNEE ARTHROPLASTY Right 04/27/2018   Procedure: TOTAL KNEE ARTHROPLASTY RIGHT;  Surgeon: Melodi Lerner, MD;  Location: [facility];  Service: Orthopedics;  Laterality: Right;    TOTAL KNEE ARTHROPLASTY Left 05/31/2019   Procedure: TOTAL KNEE ARTHROPLASTY;  Surgeon: Melodi Lerner, MD;  Location: [facility];  Service: Orthopedics;  Laterality: Left;    UPPER GI ENDOSCOPY     Family  History  Problem Relation Age of Onset   Heart disease Father    Cancer Father        Bladder Cancer   Heart attack Father    Colon polyps Father    Hypertension Mother    Alzheimer's disease Mother    Stroke Mother        3-4    Diabetes Brother    Heart disease Brother    Arthritis Brother        bilateral knee replacement, back issues    Pneumonia Sister    GI problems Sister        esophogus stretched    Crohn's disease  Son    Current Medications[1]  Allergies[2]   ROS: Review of Systems Pertinent items noted in HPI and remainder of comprehensive ROS otherwise negative.    Physical exam BP 121/75   Pulse 73   Temp 98 F (36.7 C)   Ht 6' 1 (1.854 m)   Wt 224 lb 4 oz (101.7 kg)   SpO2 97%   BMI 29.59 kg/m  General appearance: alert, cooperative, appears stated age, and no distress Head: Normocephalic, without obvious abnormality, atraumatic Eyes: negative findings: lids and lashes normal, conjunctivae and sclerae normal, corneas clear, and pupils equal, round, reactive to light and accomodation Ears: normal TM's and external ear canals both ears Nose: Nares normal. Septum midline. Mucosa normal. No drainage or sinus tenderness. Throat: lips, mucosa, and tongue normal; teeth and gums normal Neck: No lymphadenopathy appreciated.  No thyromegaly.  Cardiac murmur appreciated radiating into the carotids left greater than right Back: Increased kyphosis of thoracic spine.  Ambulating independently with normal gait and station Lungs: clear to auscultation bilaterally Chest wall: no tenderness Heart: Heart murmur appreciated.  Regular rate and rhythm. Abdomen: soft, non-tender; bowel sounds normal; no masses,  no organomegaly Extremities: extremities normal, atraumatic, no cyanosis or edema Pulses: 2+ and symmetric Skin: Skin color, texture, turgor normal. No rashes or lesions Lymph nodes: No supraclavicular or anterior cervical lymph node enlargement Neurologic: Alert  and oriented X 3, normal strength and tone. Normal symmetric reflexes. Normal coordination and gait      03/16/2024    9:41 AM 10/07/2023    2:07 PM 08/19/2023    9:33 AM  Depression screen PHQ 2/9  Decreased Interest 1 0 0  Down, Depressed, Hopeless 0 0 0  PHQ - 2 Score 1 0 0  Altered sleeping 0 0 0  Tired, decreased energy 1 0 0  Change in appetite 0 0 0  Feeling bad or failure about yourself  0 0 0  Trouble concentrating 0 0 0  Moving slowly or fidgety/restless 0 0 0  Suicidal thoughts 0 0 0  PHQ-9 Score 2 0  0   Difficult doing work/chores Not difficult at all Not difficult at all Not difficult at all     Data saved with a previous flowsheet row definition      03/16/2024    9:41 AM 08/19/2023    9:33 AM 02/11/2023   10:29 AM 08/06/2022    8:20 AM  GAD 7 : Generalized Anxiety Score  Nervous, Anxious, on Edge 0 0 0 0  Control/stop worrying 1 0 0 1  Worry too much - different things 0 0 0 0  Trouble relaxing 0 0 0 1  Restless 0 0 0 0  Easily annoyed or irritable 1 0 0 1  Afraid - awful might happen 0 0 0 0  Total GAD 7 Score 2 0 0 3  Anxiety Difficulty Not difficult at all Not difficult at all Not difficult at all Not difficult at all    Recent Results (from the past 2160 hours)  PSA     Status: None   Collection Time: 03/15/24 10:38 AM  Result Value Ref Range   Prostate Specific Ag, Serum 3.8 0.0 - 4.0 ng/mL    Comment: Roche ECLIA methodology. According to the American Urological Association, Serum PSA should decrease and remain at undetectable levels after radical prostatectomy. The AUA defines biochemical recurrence as an initial PSA value 0.2 ng/mL or greater followed by a subsequent confirmatory PSA value 0.2 ng/mL or greater.  Values obtained with different assay methods or kits cannot be used interchangeably. Results cannot be interpreted as absolute evidence of the presence or absence of malignant disease.   TSH     Status: None   Collection Time: 03/15/24  10:38 AM  Result Value Ref Range   TSH 2.110 0.450 - 4.500 uIU/mL  CBC     Status: Abnormal   Collection Time: 03/15/24 10:38 AM  Result Value Ref Range   WBC 6.3 3.4 - 10.8 x10E3/uL   RBC 4.59 4.14 - 5.80 x10E6/uL   Hemoglobin 15.0 13.0 - 17.7 g/dL   Hematocrit 55.7 62.4 - 51.0 %   MCV 96 79 - 97 fL   MCH 32.7 26.6 - 33.0 pg   MCHC 33.9 31.5 - 35.7 g/dL   RDW 87.3 88.3 - 84.5 %   Platelets 147 (L) 150 - 450 x10E3/uL  Lipid panel     Status: None   Collection Time: 03/15/24 10:38 AM  Result Value Ref Range   Cholesterol, Total 128 100 - 199 mg/dL   Triglycerides 72 0 - 149 mg/dL   HDL 52 >60 mg/dL   VLDL Cholesterol Cal 15 5 - 40 mg/dL   LDL Chol Calc (NIH) 61 0 - 99 mg/dL   Chol/HDL Ratio 2.5 0.0 - 5.0 ratio    Comment:                                   T. Chol/HDL Ratio                                             Men  Women                               1/2 Avg.Risk  3.4    3.3                                   Avg.Risk  5.0    4.4                                2X Avg.Risk  9.6    7.1                                3X Avg.Risk 23.4   11.0   CMP14+EGFR     Status: None   Collection Time: 03/15/24 10:38 AM  Result Value Ref Range   Glucose 96 70 - 99 mg/dL   BUN 23 8 - 27 mg/dL   Creatinine, Ser 8.98 0.76 - 1.27 mg/dL   eGFR 75 >40 fO/fpw/8.26   BUN/Creatinine Ratio 23 10 - 24   Sodium 143 134 - 144 mmol/L   Potassium 4.6 3.5 - 5.2 mmol/L   Chloride 106 96 - 106 mmol/L   CO2 24 20 - 29 mmol/L   Calcium  9.5 8.6 - 10.2 mg/dL   Total Protein 6.5 6.0 - 8.5 g/dL   Albumin 4.5 3.7 - 4.7 g/dL   Globulin, Total 2.0 1.5 - 4.5 g/dL   Bilirubin Total 0.9 0.0 -  1.2 mg/dL   Alkaline Phosphatase 76 48 - 129 IU/L   AST 28 0 - 40 IU/L   ALT 22 0 - 44 IU/L     Assessment/ Plan: [name] [name] here for annual physical exam.   Annual physical exam  Essential hypertension  Atherosclerosis of native coronary artery of native heart without angina  pectoris  Hyperlipidemia LDL goal <70  Thrombocytopenia  Family history of bladder cancer  Benign prostatic hyperplasia, unspecified whether lower urinary tract symptoms present  Elevated serum glucose - Plan: Bayer DCA Hb A1c Waived   A1c without evidence of prediabetes or diabetes.  Sugar on metabolic panel was also normal.  No evidence of impaired fasting glucose at this time.  Labs were reviewed with the patient.  All chronic issues are stable.  May follow-up in 6 months, sooner if concerns arise.  Counseled on healthy lifestyle choices, including diet (rich in fruits, vegetables and lean meats and low in salt and simple carbohydrates) and exercise (at least 30 minutes of moderate physical activity daily).  Patient to follow up 36m  Jacarra Bobak M. Corwyn Vora, DO        [1]  Current Outpatient Medications:    aspirin EC 81 MG tablet, Take 81 mg by mouth daily. Swallow whole., Disp: , Rfl:    busPIRone  (BUSPAR ) 15 MG tablet, TAKE ONE TABLET BY MOUTH TWICE DAILY, Disp: 180 tablet, Rfl: 1   Cholecalciferol ([employer]S VIT D 5000 HIGH-POTENCY PO), Take 1 tablet by mouth daily., Disp: , Rfl:    Coenzyme Q10 50 MG CAPS, 1 tab Orally twice a day, Disp: , Rfl:    Digestive Enzymes (ENZYME DIGEST PO), Take 1 capsule by mouth at bedtime., Disp: , Rfl:    Evolocumab  (REPATHA  SURECLICK) 140 MG/ML SOAJ, INJECT 1 ML (140MG ) EVERY 2 WEEKS AS DIRECTED, Disp: 6 mL, Rfl: 3   fluticasone  (FLONASE ) 50 MCG/ACT nasal spray, Place 2 sprays into both nostrils daily as needed for allergies or rhinitis., Disp: 9.9 mL, Rfl: 3   mesalamine (LIALDA) 1.2 g EC tablet, Take 4.8 g by mouth every other day., Disp: , Rfl:    metoprolol  tartrate (LOPRESSOR ) 25 MG tablet, TAKE ONE TABLET TWICE DAILY, Disp: 180 tablet, Rfl: 3   Multiple Vitamin (MULTIVITAMINS PO), 1 tab Orally twice a day, Disp: , Rfl:    nitroGLYCERIN  (NITROSTAT ) 0.4 MG SL tablet, Place 1 tablet (0.4 mg total) under the tongue every 5 (five) minutes as needed  for chest pain., Disp: 25 tablet, Rfl: prn   omeprazole (PRILOSEC) 20 MG capsule, Take 20 mg by mouth daily., Disp: , Rfl:    Probiotic Product (PROBIOTIC PO), Take 1 capsule by mouth daily., Disp: , Rfl:    tamsulosin  (FLOMAX ) 0.4 MG CAPS capsule, Take 0.4 mg by mouth daily., Disp: , Rfl:    Turmeric (QC TUMERIC COMPLEX PO), Take by mouth daily., Disp: , Rfl:  [2]  Allergies Allergen Reactions   Levofloxacin     Caused C- Diff   Crestor [Rosuvastatin] Other (See Comments)    Liver problems   Vytorin [Ezetimibe-Simvastatin] Other (See Comments)    Liver problems   "

## 2024-03-16 NOTE — Patient Instructions (Signed)
 Medication Instructions:  Your physician recommends that you continue on your current medications as directed. Please refer to the Current Medication list given to you today.  *If you need a refill on your cardiac medications before your next appointment, please call your pharmacy*  Lab Work: UA today at [facility] If you have labs (blood work) drawn today and your tests are completely normal, you will receive your results only by: MyChart Message (if you have MyChart) OR A paper copy in the mail If you have any lab test that is abnormal or we need to change your treatment, we will call you to review the results.  Testing/Procedures: Echocardiogram Your physician has requested that you have an echocardiogram. Echocardiography is a painless test that uses sound waves to create images of your heart. It provides your doctor with information about the size and shape of your heart and how well your hearts chambers and valves are working. This procedure takes approximately one hour. There are no restrictions for this procedure. Please do NOT wear cologne, perfume, aftershave, or lotions (deodorant is allowed). Please arrive 15 minutes prior to your appointment time.  Please note: We ask at that you not bring children with you during ultrasound (echo/ vascular) testing. Due to room size and safety concerns, children are not allowed in the ultrasound rooms during exams. Our front office staff cannot provide observation of children in our lobby area while testing is being conducted. An adult accompanying a patient to their appointment will only be allowed in the ultrasound room at the discretion of the ultrasound technician under special circumstances. We apologize for any inconvenience.   Follow-Up: At [facility], you and your health needs are our priority.  As part of our continuing mission to provide you with exceptional heart care, our providers are all part of one team.  This team includes  your primary Cardiologist (physician) and Advanced Practice Providers or APPs (Physician Assistants and Nurse Practitioners) who all work together to provide you with the care you need, when you need it.  Your next appointment:   1 year(s)  Provider:   Lavona, MD  We recommend signing up for the patient portal called MyChart.  Sign up information is provided on this After Visit Summary.  MyChart is used to connect with patients for Virtual Visits (Telemedicine).  Patients are able to view lab/test results, encounter notes, upcoming appointments, etc.  Non-urgent messages can be sent to your provider as well.   To learn more about what you can do with MyChart, go to [email or web address].   Other Instructions Blood pressure diary: take your blood pressure for 10 days and send us  the readings.

## 2024-03-16 NOTE — Telephone Encounter (Unsigned)
 Copied from CRM [id number]. Topic: Clinical - Request for Lab/Test Order >> Mar 16, 2024  3:32 PM Marda MATSU wrote:  Patient called and stated he would like to do an Urinalysis.  There was an order in the active request but it said 2024. He forgot to ask at todays appt.  Please advise

## 2024-03-17 ENCOUNTER — Encounter: Admitting: Pulmonary Disease

## 2024-03-17 ENCOUNTER — Ambulatory Visit: Payer: Self-pay | Admitting: Cardiology

## 2024-03-17 NOTE — Telephone Encounter (Signed)
 Returned patient call, patient stated that he left a urine for Dr. Lavona and those results were normal.

## 2024-03-22 ENCOUNTER — Telehealth: Payer: Self-pay | Admitting: Family Medicine

## 2024-03-22 DIAGNOSIS — Z1283 Encounter for screening for malignant neoplasm of skin: Secondary | ICD-10-CM

## 2024-03-22 NOTE — Telephone Encounter (Signed)
 Copied from CRM [id number]. Topic: Referral - Request for Referral >> Mar 22, 2024 11:19 AM Emylou G wrote: Did the patient discuss referral with their provider in the last year? No (If No - schedule appointment) (If Yes - send message)  Appointment offered? Yes  ------ Patient adv has been going to this place but now the insurance is requesting referral to be covered and his appt is 1/26 - he goes once a year..   Type of order/referral and detailed reason for visit: dermatologist.. full skin checkup  Preference of office, provider, location: Dr Norleen Hurst  Dermatologist in [place], [place] [address] [address] [place], [place], [place] [phone]   If referral order, have you been seen by this specialty before? Yes (If Yes, this issue or another issue? When? Where? Already a patient goes once a year  Can we respond through MyChart? Yes

## 2024-03-22 NOTE — Telephone Encounter (Signed)
 Referral placed and left message advising referral was placed and to call back with any further questions or concerns.

## 2024-04-15 ENCOUNTER — Encounter: Admitting: Pulmonary Disease

## 2024-04-20 ENCOUNTER — Ambulatory Visit: Payer: Medicare Other | Admitting: Internal Medicine

## 2024-04-20 ENCOUNTER — Ambulatory Visit (HOSPITAL_COMMUNITY)

## 2024-09-21 ENCOUNTER — Ambulatory Visit: Admitting: Family Medicine

## 2024-10-07 ENCOUNTER — Ambulatory Visit: Payer: Self-pay

## 2025-03-22 ENCOUNTER — Encounter: Admitting: Family Medicine
